# Patient Record
Sex: Female | Born: 1935
Health system: Southern US, Community
[De-identification: ages and names within clinical notes are randomized; demographics above are authoritative.]

## PROBLEM LIST (undated history)

## (undated) DIAGNOSIS — E7800 Pure hypercholesterolemia, unspecified: Secondary | ICD-10-CM

## (undated) DIAGNOSIS — D469 Myelodysplastic syndrome, unspecified: Secondary | ICD-10-CM

## (undated) DIAGNOSIS — I10 Essential (primary) hypertension: Secondary | ICD-10-CM

## (undated) DIAGNOSIS — E119 Type 2 diabetes mellitus without complications: Secondary | ICD-10-CM

## (undated) DIAGNOSIS — Z9109 Other allergy status, other than to drugs and biological substances: Secondary | ICD-10-CM

## (undated) DIAGNOSIS — D649 Anemia, unspecified: Secondary | ICD-10-CM

## (undated) HISTORY — DX: Myelodysplastic syndrome, unspecified: D46.9

## (undated) HISTORY — DX: Essential (primary) hypertension: I10

## (undated) HISTORY — PX: ABDOMINAL HYSTERECTOMY: SHX81

## (undated) HISTORY — DX: Type 2 diabetes mellitus without complications: E11.9

## (undated) HISTORY — PX: DILATION AND CURETTAGE OF UTERUS: SHX78

## (undated) HISTORY — DX: Anemia, unspecified: D64.9

## (undated) HISTORY — DX: Pure hypercholesterolemia, unspecified: E78.00

## (undated) HISTORY — DX: Other allergy status, other than to drugs and biological substances: Z91.09

---

## 1961-09-05 HISTORY — PX: TONSILECTOMY, ADENOIDECTOMY, BILATERAL MYRINGOTOMY AND TUBES: SHX2538

## 1983-09-06 HISTORY — PX: TOTAL ABDOMINAL HYSTERECTOMY W/ BILATERAL SALPINGOOPHORECTOMY: SHX83

## 2004-11-09 ENCOUNTER — Ambulatory Visit: Payer: Self-pay | Admitting: Internal Medicine

## 2004-11-11 ENCOUNTER — Ambulatory Visit: Payer: Self-pay | Admitting: Internal Medicine

## 2005-02-11 ENCOUNTER — Ambulatory Visit: Payer: Self-pay | Admitting: Internal Medicine

## 2005-08-16 ENCOUNTER — Ambulatory Visit: Payer: Self-pay | Admitting: Internal Medicine

## 2005-11-16 ENCOUNTER — Ambulatory Visit: Payer: Self-pay | Admitting: Internal Medicine

## 2006-06-19 ENCOUNTER — Ambulatory Visit: Payer: Self-pay | Admitting: Internal Medicine

## 2006-08-08 ENCOUNTER — Other Ambulatory Visit: Admission: RE | Admit: 2006-08-08 | Discharge: 2006-08-08 | Payer: Self-pay | Admitting: Surgery

## 2006-08-15 ENCOUNTER — Ambulatory Visit: Payer: Self-pay | Admitting: Unknown Physician Specialty

## 2006-11-27 ENCOUNTER — Ambulatory Visit: Payer: Self-pay | Admitting: Internal Medicine

## 2007-06-01 ENCOUNTER — Ambulatory Visit: Payer: Self-pay | Admitting: Family Medicine

## 2007-12-10 ENCOUNTER — Ambulatory Visit: Payer: Self-pay | Admitting: Internal Medicine

## 2008-12-25 ENCOUNTER — Ambulatory Visit: Payer: Self-pay | Admitting: Internal Medicine

## 2009-05-04 ENCOUNTER — Ambulatory Visit: Payer: Self-pay

## 2009-06-25 ENCOUNTER — Ambulatory Visit: Payer: Self-pay | Admitting: General Practice

## 2009-07-08 ENCOUNTER — Ambulatory Visit: Payer: Self-pay | Admitting: General Practice

## 2010-02-22 ENCOUNTER — Ambulatory Visit: Payer: Self-pay | Admitting: Internal Medicine

## 2011-02-02 ENCOUNTER — Ambulatory Visit: Payer: Self-pay | Admitting: Ophthalmology

## 2011-03-01 ENCOUNTER — Ambulatory Visit: Payer: Self-pay | Admitting: Internal Medicine

## 2012-02-01 LAB — LIPID PANEL
Cholesterol: 156 mg/dL (ref 0–200)
HDL: 3 mg/dL — AB (ref 35–70)
LDL Cholesterol: 54 mg/dL

## 2012-02-01 LAB — BASIC METABOLIC PANEL: Creatinine: 0.6 mg/dL (ref <=1.1)

## 2012-03-01 ENCOUNTER — Ambulatory Visit: Payer: Self-pay | Admitting: Internal Medicine

## 2012-03-01 LAB — HM MAMMOGRAPHY

## 2012-03-03 LAB — HEMOGLOBIN A1C: Hgb A1c MFr Bld: 6 % (ref 4.0–6.0)

## 2012-06-04 ENCOUNTER — Telehealth: Payer: Self-pay | Admitting: Internal Medicine

## 2012-06-04 ENCOUNTER — Encounter: Payer: Self-pay | Admitting: Internal Medicine

## 2012-06-04 ENCOUNTER — Ambulatory Visit (INDEPENDENT_AMBULATORY_CARE_PROVIDER_SITE_OTHER): Payer: Medicare Other | Admitting: Internal Medicine

## 2012-06-04 VITALS — BP 130/79 | HR 78 | Temp 98.1°F | Ht 61.75 in | Wt 186.5 lb

## 2012-06-04 DIAGNOSIS — I10 Essential (primary) hypertension: Secondary | ICD-10-CM | POA: Insufficient documentation

## 2012-06-04 DIAGNOSIS — M79609 Pain in unspecified limb: Secondary | ICD-10-CM

## 2012-06-04 DIAGNOSIS — J309 Allergic rhinitis, unspecified: Secondary | ICD-10-CM

## 2012-06-04 DIAGNOSIS — M79605 Pain in left leg: Secondary | ICD-10-CM

## 2012-06-04 DIAGNOSIS — Z9109 Other allergy status, other than to drugs and biological substances: Secondary | ICD-10-CM

## 2012-06-04 DIAGNOSIS — E119 Type 2 diabetes mellitus without complications: Secondary | ICD-10-CM

## 2012-06-04 DIAGNOSIS — D469 Myelodysplastic syndrome, unspecified: Secondary | ICD-10-CM

## 2012-06-04 DIAGNOSIS — Z23 Encounter for immunization: Secondary | ICD-10-CM

## 2012-06-04 MED ORDER — GLUCOSE BLOOD VI STRP: ORAL_STRIP | Status: DC

## 2012-06-04 NOTE — Telephone Encounter (Signed)
Spoke to pt.  She is coming in today at 1:30 for evaluation.

## 2012-06-04 NOTE — Telephone Encounter (Signed)
I put patient in for an appointment this afternoon at 1:30.

## 2012-06-04 NOTE — Assessment & Plan Note (Signed)
Controlled with Fluticasone.

## 2012-06-04 NOTE — Telephone Encounter (Signed)
Pt called insisting on getting an appointment with dr scott Pt stated she is taking revlimid 10mg   She has been taking this for 7 years Pt stated that her left leg has pain behind the left knee  Pain goes up and down leg Pt stated there is no discoloration or no swelling or heat I advise pt to call [facility] pt refused does not want to see anyone there Call a nurse cannot talk to pt because she has not been seen here Pt is going to call [facility] to see if you can be seen there

## 2012-06-04 NOTE — Progress Notes (Signed)
  Subjective:    Patient ID: Kara Griffin, female    DOB: 1936/01/22, 76 y.o.   MRN: [id number]  HPI  76 year old female with past history of diabetes, hypertension and myelodysplastic syndrome.  She comes in today as a workin with concerns regarding left leg pain that has been present for approximately six weeks.  Pain is in the left leg (behind the left knee).  Minimal swelling.  No redness.    Past Medical History  Diagnosis Date  . Hypertension   . Diabetes mellitus   . Hypercholesterolemia   . Environmental allergies   . Myelodysplastic syndrome     Sees Dr Miachel Roux     Review of Systems    Patient denies any headache, lightheadedness or dizziness.  No chest pain, tightness or palpitations. No increased shortness of breath, cough or congestion.  No nausea or vomiting.  No abdominal pain or cramping.  No bowel change, such as diarrhea, constipation, BRBPR or melana.  No urine change. She reports her left leg has been hurting for approximately six weeks.  Has been taking Tylenol ES (2/day) with some relief.  No known injury or trauma.  Pain increases with full extension of her leg.  No redness or increased warmth.  Some minimal soft tissue swelling - posterior knee.  Occasionally radiates up and down her leg.  She occasionally has some posterior buttock pain with pain extending down - posterior leg.  No new numbness or tingling.        Objective:   Physical Exam  76 year old female in no acute distress.  HEART:  Appears to be regular. LUNGS:  Without crackles or wheezing audible.  Respirations even and unlabored.    EXTREMITIES:  Minimal bilateral lower extremity edema.  Some increased soft tissue fullness left popliteal region.  Increased pain to palpation - left popliteal region.  No increased erythema or warmth.  Increased pain (in left popliteal region and extending - posterior thigh) with full extension of her left leg.  Some pain with weight bearing, but no pain with  abduction/adduction - left leg/hip.  DP pulses palpable and equal bilaterally (2 plus).                     Assessment & Plan:  1.  Left leg pain.  Given her history of Myelodysplastic Syndrome and current treatment with Revlimid (with the associated leg pain) - will obtain lower extremity venous ultrasound to rule out DVT.  Continue Tylenol ES - two tablets bid prn.  Will also confirm no evidence of a Bakers Cyst.  Further w/up and treatment pending results.

## 2012-06-04 NOTE — Assessment & Plan Note (Signed)
Blood pressure controlled on Losartan.

## 2012-06-04 NOTE — Assessment & Plan Note (Signed)
Continues on Revlimid.  Sees Dr Miachel Roux.  Per patient, counts have been stable.

## 2012-06-04 NOTE — Assessment & Plan Note (Addendum)
She started back on Amaryl (2mg ) 1/2 tab q day.  Sugars averaging 130-140 now.  Will plan for f/u met b and a1c. Check blood sugar bid since starting back on Amaryl.

## 2012-06-04 NOTE — Patient Instructions (Addendum)
It was good to see you again.  I'm sorry your leg has been bothering you.  I am going to order an ultrasound on your leg today - to confirm no clot.  Take your Tylenol ES as we discussed.  You will be notified of your ultrasound results - once they are available to [place].

## 2012-06-05 ENCOUNTER — Telehealth: Payer: Self-pay | Admitting: Internal Medicine

## 2012-06-05 ENCOUNTER — Encounter: Payer: Self-pay | Admitting: Internal Medicine

## 2012-06-05 DIAGNOSIS — M79606 Pain in leg, unspecified: Secondary | ICD-10-CM

## 2012-06-05 NOTE — Telephone Encounter (Signed)
Pt was notified of lower extremity ultrasound results -  No DVT, but did reveal a small popliteal cyst and a small amount of fluid behind the knee.  Given the persistant pain, ultrasound findings and inability to take antiinflammatories and other pain meds - needs referral to ortho.  Prefers to see Dr Ernest Pine or Lia Hopping (Dr Hooten's PA).  Thanks.

## 2012-06-05 NOTE — Telephone Encounter (Signed)
Pt was notified of lower extremity ultrasound results -   No DVT, but she did have a small cyst (popliteal) and fluid behind the knee.  Given her persistant leg pain, findings on ultrasound and inability to take antiinflammatories and increased pain meds - needs referral to ortho.  Wants to see Dr Ernest Pine or Lia Hopping (Dr Hooten's PA).  Can go anytime except next Tuesday (06/12/12).  thanks

## 2012-06-08 ENCOUNTER — Encounter: Payer: Self-pay | Admitting: Internal Medicine

## 2012-06-11 NOTE — Telephone Encounter (Signed)
Left message on machine at home advising patient that Erie Noe will be in contact with her to schedule appt.

## 2012-06-11 NOTE — Telephone Encounter (Signed)
Notify pt that Erie Noe has an order for the appt and should contact her with an appt.  Thanks.

## 2012-06-11 NOTE — Telephone Encounter (Signed)
Kara Griffin has the order to schedule the appt.  Please notify pt - Kara Griffin will call her with an appt.  Thanks.

## 2012-08-03 ENCOUNTER — Encounter: Payer: Self-pay | Admitting: *Deleted

## 2012-08-10 ENCOUNTER — Other Ambulatory Visit: Payer: Self-pay | Admitting: *Deleted

## 2012-08-10 ENCOUNTER — Encounter: Payer: Self-pay | Admitting: *Deleted

## 2012-08-13 ENCOUNTER — Encounter: Payer: Self-pay | Admitting: Internal Medicine

## 2012-08-13 ENCOUNTER — Ambulatory Visit (INDEPENDENT_AMBULATORY_CARE_PROVIDER_SITE_OTHER): Payer: Medicare Other | Admitting: Internal Medicine

## 2012-08-13 VITALS — BP 142/70 | HR 52 | Temp 97.8°F | Ht 61.75 in | Wt 185.0 lb

## 2012-08-13 DIAGNOSIS — I10 Essential (primary) hypertension: Secondary | ICD-10-CM

## 2012-08-13 DIAGNOSIS — D469 Myelodysplastic syndrome, unspecified: Secondary | ICD-10-CM

## 2012-08-13 DIAGNOSIS — M949 Disorder of cartilage, unspecified: Secondary | ICD-10-CM

## 2012-08-13 DIAGNOSIS — E7800 Pure hypercholesterolemia, unspecified: Secondary | ICD-10-CM

## 2012-08-13 DIAGNOSIS — M8580 Other specified disorders of bone density and structure, unspecified site: Secondary | ICD-10-CM | POA: Insufficient documentation

## 2012-08-13 DIAGNOSIS — Z9109 Other allergy status, other than to drugs and biological substances: Secondary | ICD-10-CM

## 2012-08-13 DIAGNOSIS — E041 Nontoxic single thyroid nodule: Secondary | ICD-10-CM | POA: Insufficient documentation

## 2012-08-13 DIAGNOSIS — M899 Disorder of bone, unspecified: Secondary | ICD-10-CM

## 2012-08-13 DIAGNOSIS — E119 Type 2 diabetes mellitus without complications: Secondary | ICD-10-CM

## 2012-08-13 MED ORDER — METFORMIN HCL 500 MG PO TABS: 500.0000 mg | ORAL_TABLET | Freq: Every day | ORAL | Status: DC

## 2012-08-13 NOTE — Progress Notes (Signed)
  Subjective:    Patient ID: Kara Griffin, female    DOB: 11/05/1935, 76 y.o.   MRN: [id number]  HPI 76 year old female with past history of myelodysplastic syndrome, anemia, diabetes and hypertension who comes in today for a scheduled follow up.  She states her sugars have been running high lately.  Is off Amaryl.  States her am sugars are averaging 160-170 and pm sugars 160-190.  Was having lows on the amaryl.  Trying to watch what she eats.  No nausea or vomiting.  Bowels stable.  Blood pressure averaging - 120s/70s.  Still seeing Dr Lowell Guitar.  Counts have been stable.   Past Medical History  Diagnosis Date  . Hypertension   . Diabetes mellitus   . Hypercholesterolemia   . Environmental allergies   . Myelodysplastic syndrome     Sees Dr Miachel Roux  . Anemia     Review of Systems Patient denies any headache, lightheadedness or dizziness.  No significant allergy or sinus symptoms.  No chest pain, tightness or palpitations.  No increased shortness of breath, cough or congestion.  No nausea or vomiting.  No abdominal pain or cramping.  No bowel change, such as diarrhea, constipation, BRBPR or melana.  No urine change.        Objective:   Physical Exam Filed Vitals:   08/13/12 1404  BP: 142/70  Pulse: 52  Temp: 97.8 F (69.28 C)   76 year old female in no acute distress.   HEENT:  Nares - clear.  OP- without lesions or erythema.  NECK:  Supple, nontender.  No audible bruit.   HEART:  Appears to be regular. LUNGS:  Without crackles or wheezing audible.  Respirations even and unlabored.   RADIAL PULSE:  Equal bilaterally.  ABDOMEN:  Soft, nontender.  No audible abdominal bruit.   EXTREMITIES:  No increased edema to be present.                     Assessment & Plan:  GI.  Colonoscopy 08/05/06 - internal nonbleeding hemorrhoids.  Bowels stable.   HEALTH MAINTENANCE.  Physical 02/08/12.  Colonoscopy as outlined.  Pap 01/11/10 negative.  Mammogram 03/01/11 - BiRADS II.  Need results of  2013 mammogram.

## 2012-08-13 NOTE — Patient Instructions (Addendum)
It was nice seeing you today.  I am going to start metformin 500mg  one per day.  Check your blood sugar twice a day.  Let me know if any low sugars.

## 2012-08-17 ENCOUNTER — Other Ambulatory Visit (INDEPENDENT_AMBULATORY_CARE_PROVIDER_SITE_OTHER): Payer: Medicare Other

## 2012-08-17 DIAGNOSIS — E041 Nontoxic single thyroid nodule: Secondary | ICD-10-CM

## 2012-08-17 DIAGNOSIS — E7800 Pure hypercholesterolemia, unspecified: Secondary | ICD-10-CM

## 2012-08-17 DIAGNOSIS — E119 Type 2 diabetes mellitus without complications: Secondary | ICD-10-CM

## 2012-08-17 LAB — HEMOGLOBIN A1C: Hgb A1c MFr Bld: 6.2 % (ref 4.6–6.5)

## 2012-08-17 LAB — BASIC METABOLIC PANEL
BUN: 16 mg/dL (ref 6–23)
CO2: 28 mEq/L (ref 19–32)
Calcium: 9.3 mg/dL (ref 8.4–10.5)
Chloride: 102 mEq/L (ref 96–112)
Creatinine, Ser: 0.7 mg/dL (ref 0.4–1.2)
GFR: 85.32 mL/min (ref >60.00)
Glucose, Bld: 168 mg/dL — ABNORMAL HIGH (ref 70–99)
Potassium: 4.1 mEq/L (ref 3.5–5.1)
Sodium: 137 mEq/L (ref 135–145)

## 2012-08-17 LAB — LIPID PANEL
Cholesterol: 151 mg/dL (ref 0–200)
HDL: 52.8 mg/dL (ref >39.00)
LDL Cholesterol: 71 mg/dL (ref 0–99)
Total CHOL/HDL Ratio: 3
Triglycerides: 134 mg/dL (ref 0.0–149.0)
VLDL: 26.8 mg/dL (ref 0.0–40.0)

## 2012-08-17 LAB — TSH: TSH: 1.36 u[IU]/mL (ref 0.35–5.50)

## 2012-08-18 ENCOUNTER — Telehealth: Payer: Self-pay | Admitting: Internal Medicine

## 2012-08-18 ENCOUNTER — Encounter: Payer: Self-pay | Admitting: Internal Medicine

## 2012-08-18 NOTE — Assessment & Plan Note (Signed)
Sugars as outlined.  Continue diabetic diet.  Will start metformin 500mg  q day.  Follow sugars and record.  Get her back in soon to reassess.

## 2012-08-18 NOTE — Telephone Encounter (Signed)
Pt notified of labs via my chart messaging.  

## 2012-08-18 NOTE — Assessment & Plan Note (Signed)
Bone density revealed osteopenia.  Continue calcium with vitamin D.  Follow.

## 2012-08-18 NOTE — Assessment & Plan Note (Signed)
Continues to follow up with Dr Lowell Guitar.  Counts have been stable.

## 2012-08-18 NOTE — Assessment & Plan Note (Signed)
Worked up by Dr Gerkin.  Has been released.  Follow TSH.    

## 2012-08-18 NOTE — Assessment & Plan Note (Signed)
Blood pressure under good control.  Same meds.  Check metabolic panel.  

## 2012-08-18 NOTE — Assessment & Plan Note (Signed)
Low cholesterol diet and exercise.  Check lipid profile.   

## 2012-08-18 NOTE — Assessment & Plan Note (Signed)
Controlled.  

## 2012-08-21 ENCOUNTER — Telehealth: Payer: Self-pay | Admitting: Internal Medicine

## 2012-08-21 NOTE — Telephone Encounter (Signed)
She can increase metformin to bid.  Take one before breakfast and one before supper.  Continue to record sugars.  Let me know if problems.

## 2012-08-21 NOTE — Telephone Encounter (Signed)
Called patient on cell line was busy. Could not get through on phone line. Will call back later.

## 2012-08-21 NOTE — Telephone Encounter (Signed)
Actually have her stay on the metformin q day for now.  Continue to record sugars.  Will see if need to increase in the future.  Let me know if dizziness does not resolve.

## 2012-08-21 NOTE — Telephone Encounter (Signed)
Pt is calling about Metformin and she is feeling better in some ways and greatly improved her energy but her sugar is still high and it is not lowering her numbers.  Its was 169  [phone] 188 [phone] 182   She is having slight dizziness as a side affect but it doesn't last long and it seems to be decreasing.

## 2012-08-23 NOTE — Telephone Encounter (Signed)
Called patients son and left message on machine per patients request

## 2012-08-23 NOTE — Telephone Encounter (Signed)
Patient called confused about message left on machine. I gave message to her.

## 2012-08-27 ENCOUNTER — Telehealth: Payer: Self-pay | Admitting: Internal Medicine

## 2012-08-27 NOTE — Telephone Encounter (Signed)
Patient doesn't need refill per [facility]. She needs form documented for medicare. They will be faxing over form

## 2012-08-27 NOTE — Telephone Encounter (Signed)
Contour test strips 50's  Use twice daily  #50

## 2012-08-28 ENCOUNTER — Other Ambulatory Visit: Payer: Self-pay | Admitting: Internal Medicine

## 2012-08-28 MED ORDER — LOSARTAN POTASSIUM 50 MG PO TABS: 50.0000 mg | ORAL_TABLET | Freq: Every day | ORAL | Status: DC

## 2012-08-28 NOTE — Telephone Encounter (Signed)
Prescription called in to pharmacy and patient notified.

## 2012-08-28 NOTE — Telephone Encounter (Signed)
Refill Losartan 50 mg  [facility] [place]

## 2012-09-04 ENCOUNTER — Telehealth: Payer: Self-pay | Admitting: *Deleted

## 2012-09-04 NOTE — Telephone Encounter (Signed)
Patient notified

## 2012-09-04 NOTE — Telephone Encounter (Signed)
Call concerning sugar readings patient dropped off for Dr. Lorin Picket. Called patient to tell her to take metformin bid (per scott). One with breakfast and one with supper. Left message for patient to return call. Patient is to continue to record sugars.

## 2012-09-20 ENCOUNTER — Encounter: Payer: Self-pay | Admitting: Internal Medicine

## 2012-09-20 ENCOUNTER — Ambulatory Visit (INDEPENDENT_AMBULATORY_CARE_PROVIDER_SITE_OTHER): Payer: 59 | Admitting: Internal Medicine

## 2012-09-20 VITALS — BP 120/70 | HR 85 | Temp 97.8°F | Ht 61.75 in | Wt 187.5 lb

## 2012-09-20 DIAGNOSIS — Z9109 Other allergy status, other than to drugs and biological substances: Secondary | ICD-10-CM

## 2012-09-20 DIAGNOSIS — H539 Unspecified visual disturbance: Secondary | ICD-10-CM

## 2012-09-20 DIAGNOSIS — I10 Essential (primary) hypertension: Secondary | ICD-10-CM

## 2012-09-20 DIAGNOSIS — E119 Type 2 diabetes mellitus without complications: Secondary | ICD-10-CM

## 2012-09-20 DIAGNOSIS — D469 Myelodysplastic syndrome, unspecified: Secondary | ICD-10-CM

## 2012-09-20 DIAGNOSIS — E7800 Pure hypercholesterolemia, unspecified: Secondary | ICD-10-CM

## 2012-09-20 MED ORDER — FLUTICASONE PROPIONATE 50 MCG/ACT NA SUSP: 2.0000 | Freq: Every day | NASAL | Status: DC

## 2012-09-20 MED ORDER — METFORMIN HCL 500 MG PO TABS: 500.0000 mg | ORAL_TABLET | Freq: Two times a day (BID) | ORAL | Status: DC

## 2012-09-23 ENCOUNTER — Encounter: Payer: Self-pay | Admitting: Internal Medicine

## 2012-09-23 NOTE — Assessment & Plan Note (Signed)
Blood pressure doing well.  Same medication regimen.  Follow metabolic panel.   

## 2012-09-23 NOTE — Assessment & Plan Note (Signed)
Low cholesterol diet and exercise.  Follow.   

## 2012-09-23 NOTE — Assessment & Plan Note (Signed)
Sugars trending down on metformin bid.  Follow.

## 2012-09-23 NOTE — Assessment & Plan Note (Signed)
Controlled.  

## 2012-09-23 NOTE — Assessment & Plan Note (Signed)
Followed by Dr Powell.  Counts have been stable.  Follow.  

## 2012-09-23 NOTE — Progress Notes (Signed)
Subjective:    Patient ID: Kara Griffin, female    DOB: 09-Apr-1936, 77 y.o.   MRN: [id number]  HPI 77 year old female with past history of myelodysplastic syndrome, anemia, diabetes and hypertension who comes in today for a scheduled follow up.  Sugars were running high.  She was started on metformin.  Continued to remain elevated and a couple of weeks ago, metformin was increased to bid.   Her am sugars are averaging 148-170 and pm sugars 160-190.  Was having lows on the amaryl.  Trying to watch what she eats.  No nausea or vomiting.  Bowels doing better since starting the metformin.   Blood pressure averaging - 120s/70s.  Still seeing Dr Lowell Guitar.  Counts have been stable.  She does report that today approximately 12:00 she noticed a red light - left eye.  Lasted a few seconds.  Was a little dizzy.  Eye hurts some now.  Vision back to normal.  No longer seeing red.  Eye feels irritated.    Past Medical History  Diagnosis Date  . Hypertension   . Diabetes mellitus   . Hypercholesterolemia   . Environmental allergies   . Myelodysplastic syndrome     Sees Dr Miachel Roux  . Anemia     Current Outpatient Prescriptions on File Prior to Visit  Medication Sig Dispense Refill  . Calcium Carbonate-Vitamin D (CALCIUM 600+D) 600-400 MG-UNIT per tablet Take 1 tablet by mouth daily.       . fish oil-omega-3 fatty acids 1000 MG capsule Take 1 g by mouth daily.       . fluticasone (FLONASE) 50 MCG/ACT nasal spray Place 2 sprays into the nose daily.  16 g  3  . glucose blood (BAYER CONTOUR NEXT TEST) test strip Use as instructed  60 each  12  . lenalidomide (REVLIMID) 10 MG capsule Take 10 mg by mouth daily.      Marland Kitchen losartan (COZAAR) 50 MG tablet Take 1 tablet (50 mg total) by mouth daily.  30 tablet  2  . metFORMIN (GLUCOPHAGE) 500 MG tablet Take 1 tablet (500 mg total) by mouth 2 (two) times daily.  60 tablet  5  . aspirin 81 MG tablet Take 81 mg by mouth daily.      . Cholecalciferol (VITAMIN D-3)  1000 UNITS CAPS Take by mouth daily.      Marland Kitchen estradiol (ESTRACE) 1 MG tablet Take 1 mg by mouth daily.        Review of Systems Patient denies any headache.  Had a little dizziness with the vision change, but states the dizziness that she was experiencing previously has resolved.   No significant allergy or sinus symptoms.  No chest pain, tightness or palpitations.  No increased shortness of breath, cough or congestion.  No nausea or vomiting.  No abdominal pain or cramping.  No BRBPR or melana.  Bowels doing better. No urine change.        Objective:   Physical Exam  Filed Vitals:   09/20/12 1355  BP: 120/70  Pulse: 85  Temp: 97.8 F (36.6 C)   Blood pressure recheck:  68/24  77 year old female in no acute distress.   HEENT:  Nares - clear.  OP- without lesions or erythema.  Left eye - minimal erythemal  NECK:  Supple, nontender.  No audible bruit.   HEART:  Appears to be regular. LUNGS:  Without crackles or wheezing audible.  Respirations even and unlabored.   RADIAL PULSE:  Equal bilaterally.  ABDOMEN:  Soft, nontender.  No audible abdominal bruit.   EXTREMITIES:  No increased edema to be present.                     Assessment & Plan:  OPTHALMOLOGY.  Vision change as outlined.  Eye still feeling a little irritated.  Called opthalmology.  They are going to see her now.    GI.  Colonoscopy 08/05/06 - internal nonbleeding hemorrhoids.  Bowels doing better on metformin.  Follow.    HEALTH MAINTENANCE.  Physical 02/08/12.  Colonoscopy as outlined.  Pap 01/11/10 negative.  Mammogram 03/01/12 - BiRADS II.

## 2012-10-31 ENCOUNTER — Telehealth: Payer: Self-pay | Admitting: *Deleted

## 2012-10-31 MED ORDER — METFORMIN HCL 500 MG PO TABS: ORAL_TABLET | ORAL | Status: DC

## 2012-10-31 NOTE — Telephone Encounter (Signed)
Called patient about sugars readings. Sent new prescription in to [facility].

## 2012-11-21 ENCOUNTER — Other Ambulatory Visit: Payer: Self-pay | Admitting: *Deleted

## 2012-11-21 MED ORDER — LOSARTAN POTASSIUM 50 MG PO TABS: 50.0000 mg | ORAL_TABLET | Freq: Every day | ORAL | Status: DC

## 2012-11-21 NOTE — Telephone Encounter (Signed)
Sent in to pharmacy.  

## 2012-11-29 ENCOUNTER — Ambulatory Visit (INDEPENDENT_AMBULATORY_CARE_PROVIDER_SITE_OTHER): Payer: 59 | Admitting: Internal Medicine

## 2012-11-29 ENCOUNTER — Encounter: Payer: Self-pay | Admitting: Internal Medicine

## 2012-11-29 VITALS — BP 134/70 | HR 80 | Temp 98.2°F | Ht 61.75 in | Wt 188.5 lb

## 2012-11-29 DIAGNOSIS — M949 Disorder of cartilage, unspecified: Secondary | ICD-10-CM

## 2012-11-29 DIAGNOSIS — Z9109 Other allergy status, other than to drugs and biological substances: Secondary | ICD-10-CM

## 2012-11-29 DIAGNOSIS — I10 Essential (primary) hypertension: Secondary | ICD-10-CM

## 2012-11-29 DIAGNOSIS — E119 Type 2 diabetes mellitus without complications: Secondary | ICD-10-CM

## 2012-11-29 DIAGNOSIS — M8580 Other specified disorders of bone density and structure, unspecified site: Secondary | ICD-10-CM

## 2012-11-29 DIAGNOSIS — D469 Myelodysplastic syndrome, unspecified: Secondary | ICD-10-CM

## 2012-11-29 DIAGNOSIS — E041 Nontoxic single thyroid nodule: Secondary | ICD-10-CM

## 2012-11-29 DIAGNOSIS — E7800 Pure hypercholesterolemia, unspecified: Secondary | ICD-10-CM

## 2012-11-29 MED ORDER — METFORMIN HCL 1000 MG PO TABS: 1000.0000 mg | ORAL_TABLET | Freq: Two times a day (BID) | ORAL | Status: DC

## 2012-12-02 ENCOUNTER — Encounter: Payer: Self-pay | Admitting: Internal Medicine

## 2012-12-02 NOTE — Assessment & Plan Note (Signed)
Controlled.  

## 2012-12-02 NOTE — Assessment & Plan Note (Signed)
Worked up by Dr Gerkin.  Has been released.  Follow TSH.    

## 2012-12-02 NOTE — Progress Notes (Signed)
  Subjective:    Patient ID: Kara Griffin, female    DOB: 12-08-1935, 77 y.o.   MRN: [id number]  HPI 77 year old female with past history of myelodysplastic syndrome, anemia, diabetes and hypertension who comes in today for a scheduled follow up.  Sugars were running high.  She was started on metformin.  Her am sugars are averaging 160-170 and pm sugars 140-170s.  Was having lows on amaryl.  Trying to watch what she eats.  No nausea or vomiting.  Bowels doing better since starting the metformin.   Blood pressure averaging - 120s/70s.  Still seeing Dr Lowell Guitar.  Counts have been stable.  Overall she feels sh e is doing well.     Past Medical History  Diagnosis Date  . Hypertension   . Diabetes mellitus   . Hypercholesterolemia   . Environmental allergies   . Myelodysplastic syndrome     Sees Dr Miachel Roux  . Anemia     Current Outpatient Prescriptions on File Prior to Visit  Medication Sig Dispense Refill  . aspirin 81 MG tablet Take 81 mg by mouth daily.      . Calcium Carbonate-Vitamin D (CALCIUM 600+D) 600-400 MG-UNIT per tablet Take 1 tablet by mouth daily.       . fish oil-omega-3 fatty acids 1000 MG capsule Take 1 g by mouth daily.       . fluticasone (FLONASE) 50 MCG/ACT nasal spray Place 2 sprays into the nose daily.  16 g  3  . glucose blood (BAYER CONTOUR NEXT TEST) test strip Use as instructed  60 each  12  . lenalidomide (REVLIMID) 10 MG capsule Take 10 mg by mouth daily.      Marland Kitchen losartan (COZAAR) 50 MG tablet Take 1 tablet (50 mg total) by mouth daily.  30 tablet  5   No current facility-administered medications on file prior to visit.    Review of Systems Patient denies any headache.   No significant allergy or sinus symptoms.  No chest pain, tightness or palpitations.  No increased shortness of breath, cough or congestion.  No nausea or vomiting.  No abdominal pain or cramping.  No BRBPR or melana.  Bowels doing better. No urine change.        Objective:   Physical  Exam  Filed Vitals:   11/29/12 1410  BP: 134/70  Pulse: 80  Temp: 98.2 F (36.8 C)   Blood pressure recheck:  134/68, pulse 67  76 year old female in no acute distress.   HEENT:  Nares - clear.  OP- without lesions or erythema.  Left eye - minimal erythemal  NECK:  Supple, nontender.  No audible bruit.   HEART:  Appears to be regular. LUNGS:  Without crackles or wheezing audible.  Respirations even and unlabored.   RADIAL PULSE:  Equal bilaterally.  ABDOMEN:  Soft, nontender.  No audible abdominal bruit.   EXTREMITIES:  No increased edema to be present.                     Assessment & Plan:  GI.  Colonoscopy 08/05/06 - internal nonbleeding hemorrhoids.  Bowels doing better on metformin.  Follow.    HEALTH MAINTENANCE.  Physical 02/08/12.  Colonoscopy as outlined.  Pap 01/11/10 negative.  Mammogram 03/01/12 - BiRADS II.

## 2012-12-02 NOTE — Assessment & Plan Note (Signed)
Blood pressure doing well.  Same medication regimen.  Follow metabolic panel.   

## 2012-12-02 NOTE — Assessment & Plan Note (Signed)
Low cholesterol diet and exercise.  Follow.   

## 2012-12-02 NOTE — Assessment & Plan Note (Signed)
Bone density revealed osteopenia.  Continue calcium with vitamin D.  Follow.

## 2012-12-02 NOTE — Assessment & Plan Note (Signed)
Followed by Dr Powell.  Counts have been stable.  Follow.  

## 2012-12-02 NOTE — Assessment & Plan Note (Signed)
Sugars trending down on metformin.  Will increase metformin to 1000 bid.  Follow sugars.

## 2013-02-06 ENCOUNTER — Other Ambulatory Visit: Payer: Self-pay | Admitting: Internal Medicine

## 2013-03-11 ENCOUNTER — Encounter: Payer: Self-pay | Admitting: *Deleted

## 2013-03-25 ENCOUNTER — Other Ambulatory Visit (INDEPENDENT_AMBULATORY_CARE_PROVIDER_SITE_OTHER): Payer: 59

## 2013-03-25 DIAGNOSIS — I10 Essential (primary) hypertension: Secondary | ICD-10-CM

## 2013-03-25 DIAGNOSIS — E7800 Pure hypercholesterolemia, unspecified: Secondary | ICD-10-CM

## 2013-03-25 DIAGNOSIS — E119 Type 2 diabetes mellitus without complications: Secondary | ICD-10-CM

## 2013-03-25 LAB — LIPID PANEL
Cholesterol: 144 mg/dL (ref 0–200)
HDL: 41.8 mg/dL (ref >39.00)
Triglycerides: 244 mg/dL — ABNORMAL HIGH (ref 0.0–149.0)

## 2013-03-25 LAB — BASIC METABOLIC PANEL
CO2: 27 mEq/L (ref 19–32)
GFR: 91.08 mL/min (ref >60.00)
Glucose, Bld: 158 mg/dL — ABNORMAL HIGH (ref 70–99)
Potassium: 4.1 mEq/L (ref 3.5–5.1)
Sodium: 138 mEq/L (ref 135–145)

## 2013-03-26 ENCOUNTER — Telehealth: Payer: Self-pay | Admitting: *Deleted

## 2013-03-26 LAB — LDL CHOLESTEROL, DIRECT: Direct LDL: 73.3 mg/dL

## 2013-03-26 NOTE — Telephone Encounter (Signed)
Received a call for the [facility] they would like a re-draw for the A1C

## 2013-03-27 ENCOUNTER — Telehealth: Payer: Self-pay | Admitting: Internal Medicine

## 2013-03-27 NOTE — Telephone Encounter (Signed)
Notified pt blood clotted.

## 2013-04-01 ENCOUNTER — Ambulatory Visit (INDEPENDENT_AMBULATORY_CARE_PROVIDER_SITE_OTHER): Payer: 59 | Admitting: Internal Medicine

## 2013-04-01 ENCOUNTER — Encounter: Payer: Self-pay | Admitting: Internal Medicine

## 2013-04-01 VITALS — BP 124/62 | HR 75 | Temp 98.2°F | Ht 61.75 in | Wt 187.5 lb

## 2013-04-01 DIAGNOSIS — E7800 Pure hypercholesterolemia, unspecified: Secondary | ICD-10-CM

## 2013-04-01 DIAGNOSIS — I10 Essential (primary) hypertension: Secondary | ICD-10-CM

## 2013-04-01 DIAGNOSIS — M8580 Other specified disorders of bone density and structure, unspecified site: Secondary | ICD-10-CM

## 2013-04-01 DIAGNOSIS — D469 Myelodysplastic syndrome, unspecified: Secondary | ICD-10-CM

## 2013-04-01 DIAGNOSIS — M899 Disorder of bone, unspecified: Secondary | ICD-10-CM

## 2013-04-01 DIAGNOSIS — E119 Type 2 diabetes mellitus without complications: Secondary | ICD-10-CM

## 2013-04-01 DIAGNOSIS — M949 Disorder of cartilage, unspecified: Secondary | ICD-10-CM

## 2013-04-01 DIAGNOSIS — E041 Nontoxic single thyroid nodule: Secondary | ICD-10-CM

## 2013-04-01 DIAGNOSIS — Z9109 Other allergy status, other than to drugs and biological substances: Secondary | ICD-10-CM

## 2013-04-01 NOTE — Progress Notes (Signed)
Subjective:    Patient ID: Kara Griffin, female    DOB: 1936/01/17, 77 y.o.   MRN: [id number]  HPI 77 year old female with past history of myelodysplastic syndrome, anemia, diabetes and hypertension who comes in today for a scheduled follow up.   Her am sugars are averaging 160-170 and pm sugars 160-190s.  Trying to watch what she eats.  No nausea or vomiting.  Bowels doing better since starting the metformin.   Blood pressure doing well.  Still seeing Dr Lowell Guitar.  Counts have been stable.  Overall she feels she is doing well.  Occasional mild light headedness.  Notices when standing.  No chest pain or tightness.  Breathing stable.  No headache.   She cut out/down wheat in her diet.  Arthritis better.  Energy better.     Past Medical History  Diagnosis Date  . Hypertension   . Diabetes mellitus   . Hypercholesterolemia   . Environmental allergies   . Myelodysplastic syndrome     Sees Dr Miachel Roux  . Anemia     Current Outpatient Prescriptions on File Prior to Visit  Medication Sig Dispense Refill  . aspirin 81 MG tablet Take 81 mg by mouth daily.      . Calcium Carbonate-Vitamin D (CALCIUM 600+D) 600-400 MG-UNIT per tablet Take 1 tablet by mouth daily.       . fish oil-omega-3 fatty acids 1000 MG capsule Take 1 g by mouth daily.       . fluticasone (FLONASE) 50 MCG/ACT nasal spray USE 2 SPRAYS IN EACH NOSTRIL DAILY  16 g  5  . glucose blood (BAYER CONTOUR NEXT TEST) test strip Use as instructed  60 each  12  . lenalidomide (REVLIMID) 10 MG capsule Take 10 mg by mouth daily.      Marland Kitchen losartan (COZAAR) 50 MG tablet Take 1 tablet (50 mg total) by mouth daily.  30 tablet  5  . metFORMIN (GLUCOPHAGE) 1000 MG tablet Take 1 tablet (1,000 mg total) by mouth 2 (two) times daily with a meal.  60 tablet  3   No current facility-administered medications on file prior to visit.    Review of Systems Patient denies any headache.   No significant allergy or sinus symptoms.  No chest pain,  tightness or palpitations.  No increased shortness of breath, cough or congestion.  No nausea or vomiting.  No abdominal pain or cramping.  No BRBPR or melana.  Bowels doing better. No urine change.        Objective:   Physical Exam  Filed Vitals:   04/01/13 1432  BP: 124/62  Pulse: 75  Temp: 98.2 F (36.8 C)   Pulse 110  77 year old female in no acute distress.   HEENT:  Nares - clear.  OP- without lesions or erythema.  Left eye - minimal erythemal  NECK:  Supple, nontender.  No audible bruit.   HEART:  Appears to be regular. LUNGS:  Without crackles or wheezing audible.  Respirations even and unlabored.   RADIAL PULSE:  Equal bilaterally.  ABDOMEN:  Soft, nontender.  No audible abdominal bruit.   EXTREMITIES:  No increased edema to be present.                     Assessment & Plan:  GI.  Colonoscopy 08/05/06 - internal nonbleeding hemorrhoids.  Bowels doing better on metformin.  Follow.    LIGHT HEADEDNESS.  Minimal.  Not orthostatic on exam.  Stay  hydrated.  Follow.  She desires no further w/up at this point.    HEALTH MAINTENANCE.  Physical 02/08/12.  Colonoscopy as outlined.  Pap 01/11/10 negative.  Mammogram 03/01/12 - BiRADS II.  Schedule a physical for next visit.

## 2013-04-02 ENCOUNTER — Encounter: Payer: Self-pay | Admitting: Internal Medicine

## 2013-04-02 NOTE — Assessment & Plan Note (Signed)
Blood pressure doing well.  Same medication regimen.  Follow metabolic panel.   

## 2013-04-02 NOTE — Assessment & Plan Note (Signed)
Bone density revealed osteopenia.  Continue calcium with vitamin D.  Follow.

## 2013-04-02 NOTE — Assessment & Plan Note (Signed)
Followed by Dr Powell.  Counts have been stable.  Follow.  

## 2013-04-02 NOTE — Assessment & Plan Note (Signed)
On metformin1000 bid.  Continue diet adjustment and exercise.  Follow.  Check a1c today.  Hold on additional medication.

## 2013-04-02 NOTE — Assessment & Plan Note (Signed)
Low cholesterol diet and exercise.  Follow.  Most recent fasting lipid profile revealed total cholesterol 144, triglycerides 244, HDL 42 and LDL 73.

## 2013-04-02 NOTE — Assessment & Plan Note (Signed)
Controlled.  

## 2013-04-02 NOTE — Assessment & Plan Note (Signed)
Worked up by Dr Gerkin.  Has been released.  Follow TSH.    

## 2013-04-03 ENCOUNTER — Encounter: Payer: Self-pay | Admitting: Internal Medicine

## 2013-04-09 ENCOUNTER — Other Ambulatory Visit: Payer: Self-pay | Admitting: Internal Medicine

## 2013-04-10 ENCOUNTER — Other Ambulatory Visit: Payer: Self-pay

## 2013-04-10 ENCOUNTER — Encounter: Payer: Self-pay | Admitting: *Deleted

## 2013-05-19 ENCOUNTER — Other Ambulatory Visit: Payer: Self-pay | Admitting: Internal Medicine

## 2013-07-11 ENCOUNTER — Other Ambulatory Visit: Payer: Self-pay

## 2013-08-12 ENCOUNTER — Other Ambulatory Visit: Payer: Self-pay | Admitting: Internal Medicine

## 2013-08-12 ENCOUNTER — Ambulatory Visit (INDEPENDENT_AMBULATORY_CARE_PROVIDER_SITE_OTHER): Payer: 59 | Admitting: Internal Medicine

## 2013-08-12 ENCOUNTER — Encounter: Payer: Self-pay | Admitting: Internal Medicine

## 2013-08-12 VITALS — BP 130/78 | HR 78 | Temp 97.8°F | Ht 61.5 in | Wt 180.0 lb

## 2013-08-12 DIAGNOSIS — M8580 Other specified disorders of bone density and structure, unspecified site: Secondary | ICD-10-CM

## 2013-08-12 DIAGNOSIS — I10 Essential (primary) hypertension: Secondary | ICD-10-CM

## 2013-08-12 DIAGNOSIS — M899 Disorder of bone, unspecified: Secondary | ICD-10-CM

## 2013-08-12 DIAGNOSIS — E041 Nontoxic single thyroid nodule: Secondary | ICD-10-CM

## 2013-08-12 DIAGNOSIS — E7800 Pure hypercholesterolemia, unspecified: Secondary | ICD-10-CM

## 2013-08-12 DIAGNOSIS — E119 Type 2 diabetes mellitus without complications: Secondary | ICD-10-CM

## 2013-08-12 DIAGNOSIS — D469 Myelodysplastic syndrome, unspecified: Secondary | ICD-10-CM

## 2013-08-12 DIAGNOSIS — Z9109 Other allergy status, other than to drugs and biological substances: Secondary | ICD-10-CM

## 2013-08-12 DIAGNOSIS — Z1239 Encounter for other screening for malignant neoplasm of breast: Secondary | ICD-10-CM

## 2013-08-12 DIAGNOSIS — Z1211 Encounter for screening for malignant neoplasm of colon: Secondary | ICD-10-CM

## 2013-08-12 NOTE — Progress Notes (Signed)
Orders placed for labs

## 2013-08-12 NOTE — Progress Notes (Signed)
Pre-visit discussion using our clinic review tool. No additional management support is needed unless otherwise documented below in the visit note.  

## 2013-08-12 NOTE — Progress Notes (Signed)
Subjective:    Patient ID: Kara Griffin, female    DOB: [birth year], 77 y.o.   MRN: [id number]  HPI 77 year old female with past history of myelodysplastic syndrome, anemia, diabetes and hypertension who comes in today to follow up on these issues as well as for a complete physical exam.  Her am sugars are averaging 140-160s and pm sugars 150-170s.  Trying to watch what she eats.  No nausea or vomiting.   Blood pressure doing well.  Still seeing Dr Lowell Guitar.  Counts have been stable.  Overall she feels she is doing well.   No chest pain or tightness.  Breathing stable.  No headache.   She cut out/down wheat in her diet.  Arthritis better.  Energy better.  Bowels better.    Past Medical History  Diagnosis Date  . Hypertension   . Diabetes mellitus   . Hypercholesterolemia   . Environmental allergies   . Myelodysplastic syndrome     Sees Dr Miachel Roux  . Anemia     Current Outpatient Prescriptions on File Prior to Visit  Medication Sig Dispense Refill  . aspirin 81 MG tablet Take 81 mg by mouth daily.      Marland Kitchen BAYER CONTOUR TEST test strip USE TWICE DAILY  50 each  11  . Calcium Carbonate-Vitamin D (CALCIUM 600+D) 600-400 MG-UNIT per tablet Take 1 tablet by mouth daily.       . fish oil-omega-3 fatty acids 1000 MG capsule Take 1 g by mouth daily.       Marland Kitchen lenalidomide (REVLIMID) 10 MG capsule Take 10 mg by mouth daily.      Marland Kitchen losartan (COZAAR) 50 MG tablet TAKE 1 TABLET BY MOUTH EVERY DAY  30 tablet  5  . metFORMIN (GLUCOPHAGE) 1000 MG tablet TAKE 1 TABLET BY MOUTH TWICE DAILY WITH MEALS  60 tablet  5   No current facility-administered medications on file prior to visit.    Review of Systems Patient denies any headache.   No significant allergy or sinus symptoms.  No chest pain, tightness or palpitations.  No increased shortness of breath, cough or congestion.  No nausea or vomiting.  No abdominal pain or cramping.  No BRBPR or melana.  Bowels doing better.  No urine change.  Joints  better.   Sugars as outlined.       Objective:   Physical Exam  Filed Vitals:   08/12/13 1333  BP: 130/78  Pulse: 78  Temp: 97.8 F (36.6 C)   Pulse 84, blood pressure recheck:  61/40  77 year old female in no acute distress.   HEENT:  Nares- clear.  Oropharynx - without lesions. NECK:  Supple.  Nontender.  No audible bruit.  HEART:  Appears to be regular. LUNGS:  No crackles or wheezing audible.  Respirations even and unlabored.  RADIAL PULSE:  Equal bilaterally.    BREASTS:  No nipple discharge or nipple retraction present.  Could not appreciate any distinct nodules or axillary adenopathy.  ABDOMEN:  Soft, nontender.  Bowel sounds present and normal.  No audible abdominal bruit.  GU:  Not performed.    EXTREMITIES:  No increased edema present.  DP pulses palpable and equal bilaterally.  FEET:  No lesions.          Assessment & Plan:  GI.  Colonoscopy 08/05/06 - internal nonbleeding hemorrhoids.  Bowels doing better on metformin and since she adjusted her diet.   Follow.  IFOB given.   HEALTH MAINTENANCE.  Physical today.  Colonoscopy as outlined.  Pap 01/11/10 negative.  Mammogram 03/01/12 - BiRADS II.  Schedule mammogram.  IFOB given.

## 2013-08-14 ENCOUNTER — Encounter: Payer: Self-pay | Admitting: Internal Medicine

## 2013-08-14 LAB — HM DIABETES FOOT EXAM

## 2013-08-14 NOTE — Assessment & Plan Note (Signed)
Followed by Dr Powell.  Counts have been stable.  Follow.  

## 2013-08-14 NOTE — Assessment & Plan Note (Signed)
On metformin1000 bid.  Continue diet adjustment and exercise.  Follow.  Check a1c with next labs.  Sugars as outlined.  Hold on additional medication.   

## 2013-08-14 NOTE — Assessment & Plan Note (Signed)
Bone density revealed osteopenia.  Continue vitamin D.  Follow.   

## 2013-08-14 NOTE — Assessment & Plan Note (Signed)
Controlled.  

## 2013-08-14 NOTE — Assessment & Plan Note (Signed)
Low cholesterol diet and exercise.  Follow.  Last fasting lipid profile revealed total cholesterol 144, triglycerides 244, HDL 42 and LDL 73.

## 2013-08-14 NOTE — Assessment & Plan Note (Signed)
Blood pressure doing well.  Same medication regimen.  Follow metabolic panel.   

## 2013-08-14 NOTE — Assessment & Plan Note (Signed)
Worked up by Dr Gerkin.  Has been released.  Follow TSH.    

## 2013-08-22 ENCOUNTER — Ambulatory Visit: Payer: Self-pay | Admitting: Internal Medicine

## 2013-08-23 ENCOUNTER — Other Ambulatory Visit (INDEPENDENT_AMBULATORY_CARE_PROVIDER_SITE_OTHER): Payer: 59

## 2013-08-23 DIAGNOSIS — E7800 Pure hypercholesterolemia, unspecified: Secondary | ICD-10-CM

## 2013-08-23 DIAGNOSIS — E119 Type 2 diabetes mellitus without complications: Secondary | ICD-10-CM

## 2013-08-23 DIAGNOSIS — M8580 Other specified disorders of bone density and structure, unspecified site: Secondary | ICD-10-CM

## 2013-08-23 DIAGNOSIS — I10 Essential (primary) hypertension: Secondary | ICD-10-CM

## 2013-08-23 DIAGNOSIS — E041 Nontoxic single thyroid nodule: Secondary | ICD-10-CM

## 2013-08-23 LAB — COMPREHENSIVE METABOLIC PANEL
AST: 17 U/L (ref 0–37)
Albumin: 3.9 g/dL (ref 3.5–5.2)
Alkaline Phosphatase: 66 U/L (ref 39–117)
BUN: 12 mg/dL (ref 6–23)
Calcium: 9.1 mg/dL (ref 8.4–10.5)
Chloride: 108 mEq/L (ref 96–112)
Glucose, Bld: 142 mg/dL — ABNORMAL HIGH (ref 70–99)
Potassium: 4.1 mEq/L (ref 3.5–5.1)
Sodium: 139 mEq/L (ref 135–145)
Total Protein: 6.5 g/dL (ref 6.0–8.3)

## 2013-08-23 LAB — LIPID PANEL
Cholesterol: 152 mg/dL (ref 0–200)
HDL: 50.1 mg/dL (ref >39.00)
Total CHOL/HDL Ratio: 3
Triglycerides: 224 mg/dL — ABNORMAL HIGH (ref 0.0–149.0)
VLDL: 44.8 mg/dL — ABNORMAL HIGH (ref 0.0–40.0)

## 2013-08-24 ENCOUNTER — Encounter: Payer: Self-pay | Admitting: Internal Medicine

## 2013-08-24 LAB — VITAMIN D 25 HYDROXY (VIT D DEFICIENCY, FRACTURES): Vit D, 25-Hydroxy: 30 ng/mL (ref 30–89)

## 2013-08-27 ENCOUNTER — Encounter: Payer: Self-pay | Admitting: Internal Medicine

## 2013-10-08 ENCOUNTER — Other Ambulatory Visit: Payer: Self-pay | Admitting: Internal Medicine

## 2013-11-05 ENCOUNTER — Other Ambulatory Visit: Payer: Self-pay | Admitting: Internal Medicine

## 2013-11-05 ENCOUNTER — Other Ambulatory Visit (INDEPENDENT_AMBULATORY_CARE_PROVIDER_SITE_OTHER): Payer: 59

## 2013-11-05 DIAGNOSIS — Z1211 Encounter for screening for malignant neoplasm of colon: Secondary | ICD-10-CM

## 2013-11-05 LAB — FECAL OCCULT BLOOD, IMMUNOCHEMICAL: Fecal Occult Bld: NEGATIVE

## 2013-11-06 ENCOUNTER — Encounter: Payer: Self-pay | Admitting: Internal Medicine

## 2013-11-16 ENCOUNTER — Other Ambulatory Visit: Payer: Self-pay | Admitting: Internal Medicine

## 2013-12-11 ENCOUNTER — Ambulatory Visit: Payer: 59 | Admitting: Internal Medicine

## 2014-01-30 ENCOUNTER — Encounter: Payer: Self-pay | Admitting: Internal Medicine

## 2014-01-30 ENCOUNTER — Ambulatory Visit (INDEPENDENT_AMBULATORY_CARE_PROVIDER_SITE_OTHER): Payer: 59 | Admitting: Internal Medicine

## 2014-01-30 VITALS — BP 130/62 | HR 79 | Resp 16 | Ht 61.5 in | Wt 181.2 lb

## 2014-01-30 DIAGNOSIS — M899 Disorder of bone, unspecified: Secondary | ICD-10-CM

## 2014-01-30 DIAGNOSIS — M949 Disorder of cartilage, unspecified: Secondary | ICD-10-CM

## 2014-01-30 DIAGNOSIS — E7800 Pure hypercholesterolemia, unspecified: Secondary | ICD-10-CM

## 2014-01-30 DIAGNOSIS — D469 Myelodysplastic syndrome, unspecified: Secondary | ICD-10-CM

## 2014-01-30 DIAGNOSIS — E041 Nontoxic single thyroid nodule: Secondary | ICD-10-CM

## 2014-01-30 DIAGNOSIS — Z9109 Other allergy status, other than to drugs and biological substances: Secondary | ICD-10-CM

## 2014-01-30 DIAGNOSIS — I10 Essential (primary) hypertension: Secondary | ICD-10-CM

## 2014-01-30 DIAGNOSIS — E119 Type 2 diabetes mellitus without complications: Secondary | ICD-10-CM

## 2014-01-30 DIAGNOSIS — M8580 Other specified disorders of bone density and structure, unspecified site: Secondary | ICD-10-CM

## 2014-01-30 NOTE — Progress Notes (Signed)
Pre visit review using our clinic review tool, if applicable. No additional management support is needed unless otherwise documented below in the visit note. 

## 2014-02-02 ENCOUNTER — Encounter: Payer: Self-pay | Admitting: Internal Medicine

## 2014-02-02 NOTE — Assessment & Plan Note (Signed)
On metformin1000 bid.  Continue diet adjustment and exercise.  Follow.  Check a1c with next labs.  Sugars as outlined.  Hold on additional medication.

## 2014-02-02 NOTE — Assessment & Plan Note (Signed)
Controlled.  

## 2014-02-02 NOTE — Assessment & Plan Note (Signed)
Worked up by Dr Gerkin.  Has been released.  Follow TSH.    

## 2014-02-02 NOTE — Assessment & Plan Note (Signed)
Followed by Dr Powell.  Counts have been stable.  Follow.  

## 2014-02-02 NOTE — Assessment & Plan Note (Signed)
Low cholesterol diet and exercise.  Follow fasting lipid profile.   

## 2014-02-02 NOTE — Assessment & Plan Note (Signed)
Bone density revealed osteopenia.  Continue vitamin D.  Follow.   

## 2014-02-02 NOTE — Assessment & Plan Note (Signed)
Blood pressure doing well.  Same medication regimen.  Follow metabolic panel.   

## 2014-02-02 NOTE — Progress Notes (Signed)
Subjective:    Patient ID: Kara Griffin, female    DOB: [birth year], 78 y.o.   MRN: [id number]  HPI 78 year old female with past history of myelodysplastic syndrome, anemia, diabetes and hypertension who comes in today for a scheduled follow up.  Her am sugars are averaging 140-170s and pm sugars 160-200.  Trying to watch what she eats.  No nausea or vomiting.   Blood pressure doing well.  Still seeing Dr Florene Glen.  Counts have been stable.  Overall she feels she is doing well.   No chest pain or tightness.  Breathing stable.  No headache.   She cut out/down wheat in her diet.  Arthritis better.  Energy better.  Bowels flare intermittently.  Takes immodium prn.     Past Medical History  Diagnosis Date  . Hypertension   . Diabetes mellitus   . Hypercholesterolemia   . Environmental allergies   . Myelodysplastic syndrome     Sees Dr Jerrye Noble  . Anemia     Current Outpatient Prescriptions on File Prior to Visit  Medication Sig Dispense Refill  . aspirin 81 MG tablet Take 81 mg by mouth daily.      Marland Kitchen BAYER CONTOUR TEST test strip USE TWICE DAILY  50 each  11  . Calcium Carbonate-Vitamin D (CALCIUM 600+D) 600-400 MG-UNIT per tablet Take 1 tablet by mouth daily.       . fish oil-omega-3 fatty acids 1000 MG capsule Take 1 g by mouth daily.       Marland Kitchen lenalidomide (REVLIMID) 10 MG capsule Take 10 mg by mouth daily.      Marland Kitchen losartan (COZAAR) 50 MG tablet TAKE 1 TABLET BY MOUTH EVERY DAY  30 tablet  4  . metFORMIN (GLUCOPHAGE) 1000 MG tablet TAKE 1 TABLET BY MOUTH TWICE DAILY WITH MEALS  60 tablet  5  . Multiple Vitamin (MULTIVITAMIN) tablet Take 1 tablet by mouth daily.       No current facility-administered medications on file prior to visit.    Review of Systems Patient denies any headache.   No significant allergy or sinus symptoms.  No chest pain, tightness or palpitations.  No increased shortness of breath, cough or congestion.  No nausea or vomiting.  No acid reflux.  No abdominal  pain or cramping.  No BRBPR or melana. Bowels flare intermittently.   No urine change.  Joints better.   Sugars as outlined.  See attached.         Objective:   Physical Exam  Filed Vitals:   01/30/14 1137  BP: 130/62  Pulse: 79  Resp: 16   Pulse 84, blood pressure recheck:  41/46  78 year old female in no acute distress.   HEENT:  Nares- clear.  Oropharynx - without lesions. NECK:  Supple.  Nontender.  No audible bruit.  HEART:  Appears to be regular. LUNGS:  No crackles or wheezing audible.  Respirations even and unlabored.  RADIAL PULSE:  Equal bilaterally.  ABDOMEN:  Soft, nontender.  Bowel sounds present and normal.  No audible abdominal bruit.    EXTREMITIES:  No increased edema present.  DP pulses palpable and equal bilaterally.  FEET:  No lesions.          Assessment & Plan:  GI.  Colonoscopy 08/05/06 - internal nonbleeding hemorrhoids.  Bowels doing better on metformin and since she adjusted her diet.   Follow.  IFOB 11/05/13 - negative.   HEALTH MAINTENANCE.  Physical 08/12/13.  Colonoscopy as outlined.  Pap 01/11/10 negative.  Mammogram 08/22/13 - BiRADS I.  IFOB 11/05/13 - negative.

## 2014-02-20 ENCOUNTER — Encounter: Payer: Self-pay | Admitting: Internal Medicine

## 2014-03-12 ENCOUNTER — Other Ambulatory Visit: Payer: Self-pay | Admitting: Internal Medicine

## 2014-04-08 ENCOUNTER — Other Ambulatory Visit: Payer: Self-pay | Admitting: Internal Medicine

## 2014-04-15 LAB — CBC AND DIFFERENTIAL
HCT: 40 % (ref 36–46)
Hemoglobin: 13.2 g/dL (ref 12.0–16.0)
Neutrophils Absolute: 2 /uL
PLATELETS: 127 10*3/uL — AB (ref 150–399)
WBC: 3.5 10^3/mL

## 2014-04-15 LAB — HEPATIC FUNCTION PANEL
ALK PHOS: 73 U/L (ref 25–125)
ALT: 26 U/L (ref 7–35)
AST: 19 U/L (ref 13–35)
Bilirubin, Total: 0.4 mg/dL

## 2014-04-15 LAB — BASIC METABOLIC PANEL
BUN: 14 mg/dL (ref 4–21)
CREATININE: 0.7 mg/dL (ref 0.5–1.1)
Glucose: 125 mg/dL
POTASSIUM: 3.8 mmol/L (ref 3.4–5.3)
Sodium: 137 mmol/L (ref 137–147)

## 2014-04-21 ENCOUNTER — Encounter: Payer: Self-pay | Admitting: Internal Medicine

## 2014-05-02 ENCOUNTER — Other Ambulatory Visit: Payer: Self-pay | Admitting: Internal Medicine

## 2014-05-28 ENCOUNTER — Ambulatory Visit: Payer: Self-pay | Admitting: Ophthalmology

## 2014-06-02 ENCOUNTER — Encounter: Payer: Self-pay | Admitting: Internal Medicine

## 2014-06-02 ENCOUNTER — Ambulatory Visit (INDEPENDENT_AMBULATORY_CARE_PROVIDER_SITE_OTHER): Payer: 59 | Admitting: Internal Medicine

## 2014-06-02 VITALS — BP 130/70 | HR 67 | Temp 97.9°F | Ht 61.5 in | Wt 181.2 lb

## 2014-06-02 DIAGNOSIS — E119 Type 2 diabetes mellitus without complications: Secondary | ICD-10-CM

## 2014-06-02 DIAGNOSIS — M8580 Other specified disorders of bone density and structure, unspecified site: Secondary | ICD-10-CM

## 2014-06-02 DIAGNOSIS — Z23 Encounter for immunization: Secondary | ICD-10-CM

## 2014-06-02 DIAGNOSIS — I10 Essential (primary) hypertension: Secondary | ICD-10-CM

## 2014-06-02 DIAGNOSIS — M899 Disorder of bone, unspecified: Secondary | ICD-10-CM

## 2014-06-02 DIAGNOSIS — E041 Nontoxic single thyroid nodule: Secondary | ICD-10-CM

## 2014-06-02 DIAGNOSIS — M949 Disorder of cartilage, unspecified: Secondary | ICD-10-CM

## 2014-06-02 DIAGNOSIS — E7800 Pure hypercholesterolemia, unspecified: Secondary | ICD-10-CM

## 2014-06-02 DIAGNOSIS — Z9109 Other allergy status, other than to drugs and biological substances: Secondary | ICD-10-CM

## 2014-06-02 DIAGNOSIS — D469 Myelodysplastic syndrome, unspecified: Secondary | ICD-10-CM

## 2014-06-02 NOTE — Progress Notes (Signed)
Subjective:    Patient ID: Kara Griffin, female    DOB: 12-09-1935, 78 y.o.   MRN: [id number]  HPI 78 year old female with past history of myelodysplastic syndrome, anemia, diabetes and hypertension who comes in today for a scheduled follow up.  Her am sugars are averaging 140-170s and pm sugars 160-200.  Trying to watch what she eats.  Taking metformin.  No nausea or vomiting.   Blood pressure doing well.  Still seeing Dr Florene Glen.  Counts have been slightly decreased recently.   Overall she feels she is doing well.   No chest pain or tightness.  Breathing stable.  No headache.   Bowels flare intermittently.  Takes immodium prn.  She now has hearing aids.  Adjusting well.  S/p cataract surgery - right eye.     Past Medical History  Diagnosis Date  . Hypertension   . Diabetes mellitus   . Hypercholesterolemia   . Environmental allergies   . Myelodysplastic syndrome     Sees Dr Jerrye Noble  . Anemia     Current Outpatient Prescriptions on File Prior to Visit  Medication Sig Dispense Refill  . aspirin 81 MG tablet Take 81 mg by mouth daily.      Marland Kitchen BAYER CONTOUR TEST test strip USE AS DIRECTED TO TEST BLOOD SUGAR TWICE DAILY  50 each  3  . Calcium Carbonate-Vitamin D (CALCIUM 600+D) 600-400 MG-UNIT per tablet Take 1 tablet by mouth daily.       . fish oil-omega-3 fatty acids 1000 MG capsule Take 1 g by mouth daily.       Marland Kitchen lenalidomide (REVLIMID) 10 MG capsule Take 10 mg by mouth daily.      Marland Kitchen losartan (COZAAR) 50 MG tablet TAKE 1 TABLET BY MOUTH EVERY DAY  30 tablet  5  . metFORMIN (GLUCOPHAGE) 1000 MG tablet TAKE 1 TABLET BY MOUTH TWICE DAILY WITH MEALS  60 tablet  0  . Multiple Vitamin (MULTIVITAMIN) tablet Take 1 tablet by mouth daily.       No current facility-administered medications on file prior to visit.    Review of Systems Patient denies any headache.   No significant allergy or sinus symptoms.  No chest pain, tightness or palpitations.  No increased shortness of  breath, cough or congestion.  No nausea or vomiting.  No acid reflux.  No abdominal pain or cramping.  No BRBPR or melana. Bowels flare intermittently.   No urine change.  Joints better.   Sugars as outlined.  See attached.  Still followed by Dr Florene Glen.  Counts slightly decreased recently.       Objective:   Physical Exam  Filed Vitals:   06/02/14 1105  BP: 130/70  Pulse: 67  Temp: 97.9 F (36.6 C)   blood pressure recheck:  81/64  78 year old female in no acute distress.   HEENT:  Nares- clear.  Oropharynx - without lesions. NECK:  Supple.  Nontender.  No audible bruit.  HEART:  Appears to be regular. LUNGS:  No crackles or wheezing audible.  Respirations even and unlabored.  RADIAL PULSE:  Equal bilaterally.  ABDOMEN:  Soft, nontender.  Bowel sounds present and normal.  No audible abdominal bruit.    EXTREMITIES:  No increased edema present.  DP pulses palpable and equal bilaterally.  FEET:  No lesions.          Assessment & Plan:  GI.  Colonoscopy 08/05/06 - internal nonbleeding hemorrhoids.  Bowels doing better on metformin  and since she adjusted her diet.   Follow.  IFOB 11/05/13 - negative.   HEALTH MAINTENANCE.  Physical 08/12/13.  Colonoscopy as outlined.  Pap 01/11/10 negative.  Mammogram 08/22/13 - BiRADS I.  IFOB 11/05/13 - negative.   I spent 25 minutes with the patient and more than 50% of the time was spent in consultation regarding the above.

## 2014-06-02 NOTE — Progress Notes (Signed)
Pre visit review using our clinic review tool, if applicable. No additional management support is needed unless otherwise documented below in the visit note. 

## 2014-06-03 ENCOUNTER — Encounter: Payer: Self-pay | Admitting: Internal Medicine

## 2014-06-03 ENCOUNTER — Other Ambulatory Visit: Payer: Self-pay | Admitting: Internal Medicine

## 2014-06-03 NOTE — Assessment & Plan Note (Signed)
Controlled.  Breathing stable.  

## 2014-06-03 NOTE — Assessment & Plan Note (Signed)
Low cholesterol diet and exercise.  Follow fasting lipid profile.   

## 2014-06-03 NOTE — Assessment & Plan Note (Signed)
On metformin1000 bid.  Continue diet adjustment and exercise.  We discussed diet today.  Eat regular meals.  Follow.  Check a1c with next labs at [facility] as outlined.  Hold on additional medication.

## 2014-06-03 NOTE — Assessment & Plan Note (Signed)
Worked up by Dr Harlow Asa.  Has been released.  Follow TSH.

## 2014-06-03 NOTE — Assessment & Plan Note (Signed)
Bone density revealed osteopenia.  Continue vitamin D.  Follow.

## 2014-06-03 NOTE — Assessment & Plan Note (Signed)
Followed by Dr Florene Glen.  Counts have decreased some recently.  They have been monitoring more closely.  Follow.

## 2014-06-03 NOTE — Assessment & Plan Note (Signed)
Blood pressure doing well.  Same medication regimen.  Follow metabolic panel.   

## 2014-06-10 LAB — BASIC METABOLIC PANEL
CREATININE: 0.8 mg/dL (ref 0.5–1.1)
GLUCOSE: 124 mg/dL
Potassium: 3.9 mmol/L (ref 3.4–5.3)
Sodium: 136 mmol/L — AB (ref 137–147)

## 2014-06-10 LAB — HEPATIC FUNCTION PANEL
ALT: 24 U/L (ref 7–35)
AST: 18 U/L (ref 13–35)
Alkaline Phosphatase: 77 U/L (ref 25–125)
Bilirubin, Total: 0.8 mg/dL

## 2014-06-10 LAB — HEMOGLOBIN A1C: Hgb A1c MFr Bld: 6.3 % — AB (ref 4.0–6.0)

## 2014-06-10 LAB — CBC AND DIFFERENTIAL
HCT: 40 % (ref 36–46)
Hemoglobin: 13.5 g/dL (ref 12.0–16.0)
Platelets: 133 10*3/uL — AB (ref 150–399)
WBC: 4.2 10^3/mL

## 2014-06-16 ENCOUNTER — Encounter: Payer: Self-pay | Admitting: Internal Medicine

## 2014-06-27 ENCOUNTER — Other Ambulatory Visit: Payer: Self-pay | Admitting: Internal Medicine

## 2014-09-01 ENCOUNTER — Other Ambulatory Visit: Payer: Self-pay

## 2014-09-01 NOTE — Telephone Encounter (Signed)
The patient called and is hoping to get her test strips and meter sent to the pharmacy by Jan 1.  She states insurance will cover the cost if it sent in before this date.  ([facility] in [place])   [place] - [phone]

## 2014-09-01 NOTE — Telephone Encounter (Signed)
Spoke to patient, states she dropped off information regarding what meters her insurance will cover, does not still have the letter, states she chose one that looked easy to use. Advised to call her insurance and ask which one would be the easiest for her to use and would be covered and to call us back so we can send in correct one.

## 2014-09-08 ENCOUNTER — Telehealth: Payer: Self-pay | Admitting: *Deleted

## 2014-09-08 ENCOUNTER — Other Ambulatory Visit (INDEPENDENT_AMBULATORY_CARE_PROVIDER_SITE_OTHER): Payer: Medicare Other

## 2014-09-08 ENCOUNTER — Other Ambulatory Visit: Payer: Self-pay | Admitting: *Deleted

## 2014-09-08 DIAGNOSIS — M8580 Other specified disorders of bone density and structure, unspecified site: Secondary | ICD-10-CM

## 2014-09-08 DIAGNOSIS — E7800 Pure hypercholesterolemia, unspecified: Secondary | ICD-10-CM

## 2014-09-08 DIAGNOSIS — E041 Nontoxic single thyroid nodule: Secondary | ICD-10-CM

## 2014-09-08 DIAGNOSIS — E119 Type 2 diabetes mellitus without complications: Secondary | ICD-10-CM

## 2014-09-08 DIAGNOSIS — Z9109 Other allergy status, other than to drugs and biological substances: Secondary | ICD-10-CM

## 2014-09-08 DIAGNOSIS — I10 Essential (primary) hypertension: Secondary | ICD-10-CM

## 2014-09-08 MED ORDER — ONETOUCH LANCETS MISC: Status: DC

## 2014-09-08 MED ORDER — GLUCOSE BLOOD VI STRP: ORAL_STRIP | Status: DC

## 2014-09-08 NOTE — Telephone Encounter (Signed)
Labs and dx?  

## 2014-09-08 NOTE — Telephone Encounter (Signed)
Orders placed for labs

## 2014-09-09 ENCOUNTER — Encounter: Payer: Self-pay | Admitting: Internal Medicine

## 2014-09-09 LAB — LIPID PANEL
CHOL/HDL RATIO: 3
Cholesterol: 151 mg/dL (ref 0–200)
HDL: 46.6 mg/dL (ref >39.00)
LDL CALC: 73 mg/dL (ref 0–99)
NonHDL: 104.4
Triglycerides: 157 mg/dL — ABNORMAL HIGH (ref 0.0–149.0)
VLDL: 31.4 mg/dL (ref 0.0–40.0)

## 2014-09-09 LAB — TSH: TSH: 1.29 u[IU]/mL (ref 0.35–4.50)

## 2014-09-09 LAB — HEPATIC FUNCTION PANEL
ALBUMIN: 3.8 g/dL (ref 3.5–5.2)
ALT: 19 U/L (ref 0–35)
AST: 16 U/L (ref 0–37)
Alkaline Phosphatase: 77 U/L (ref 39–117)
Bilirubin, Direct: 0.1 mg/dL (ref 0.0–0.3)
TOTAL PROTEIN: 6.6 g/dL (ref 6.0–8.3)
Total Bilirubin: 0.7 mg/dL (ref 0.2–1.2)

## 2014-09-09 LAB — BASIC METABOLIC PANEL
BUN: 11 mg/dL (ref 6–23)
CO2: 26 mEq/L (ref 19–32)
Calcium: 9 mg/dL (ref 8.4–10.5)
Chloride: 107 mEq/L (ref 96–112)
Creatinine, Ser: 0.7 mg/dL (ref 0.4–1.2)
GFR: 86.25 mL/min (ref >60.00)
Glucose, Bld: 155 mg/dL — ABNORMAL HIGH (ref 70–99)
POTASSIUM: 3.9 meq/L (ref 3.5–5.1)
Sodium: 139 mEq/L (ref 135–145)

## 2014-09-09 LAB — HEMOGLOBIN A1C: Hgb A1c MFr Bld: 6.9 % — ABNORMAL HIGH (ref 4.6–6.5)

## 2014-09-09 LAB — VITAMIN D 25 HYDROXY (VIT D DEFICIENCY, FRACTURES): VITD: 17 ng/mL — ABNORMAL LOW (ref 30.00–100.00)

## 2014-09-12 ENCOUNTER — Ambulatory Visit (INDEPENDENT_AMBULATORY_CARE_PROVIDER_SITE_OTHER): Payer: Medicare Other | Admitting: Internal Medicine

## 2014-09-12 ENCOUNTER — Encounter: Payer: Self-pay | Admitting: Internal Medicine

## 2014-09-12 VITALS — BP 122/70 | HR 77 | Temp 98.2°F | Ht 61.25 in | Wt 175.5 lb

## 2014-09-12 DIAGNOSIS — E2839 Other primary ovarian failure: Secondary | ICD-10-CM

## 2014-09-12 DIAGNOSIS — E119 Type 2 diabetes mellitus without complications: Secondary | ICD-10-CM

## 2014-09-12 DIAGNOSIS — E7800 Pure hypercholesterolemia, unspecified: Secondary | ICD-10-CM

## 2014-09-12 DIAGNOSIS — E041 Nontoxic single thyroid nodule: Secondary | ICD-10-CM

## 2014-09-12 DIAGNOSIS — D469 Myelodysplastic syndrome, unspecified: Secondary | ICD-10-CM

## 2014-09-12 DIAGNOSIS — Z1239 Encounter for other screening for malignant neoplasm of breast: Secondary | ICD-10-CM

## 2014-09-12 DIAGNOSIS — I10 Essential (primary) hypertension: Secondary | ICD-10-CM

## 2014-09-12 DIAGNOSIS — E669 Obesity, unspecified: Secondary | ICD-10-CM

## 2014-09-12 DIAGNOSIS — M8580 Other specified disorders of bone density and structure, unspecified site: Secondary | ICD-10-CM

## 2014-09-12 NOTE — Progress Notes (Signed)
Pre visit review using our clinic review tool, if applicable. No additional management support is needed unless otherwise documented below in the visit note. 

## 2014-09-14 ENCOUNTER — Encounter: Payer: Self-pay | Admitting: Internal Medicine

## 2014-09-14 DIAGNOSIS — Z6833 Body mass index (BMI) 33.0-33.9, adult: Secondary | ICD-10-CM | POA: Insufficient documentation

## 2014-09-14 NOTE — Progress Notes (Signed)
Subjective:    Patient ID: Kara Griffin, female    DOB: 1935-09-26, 79 y.o.   MRN: [id number]  HPI 79 year old female with past history of myelodysplastic syndrome, anemia, diabetes and hypertension who comes in today to follow up on these issues as well as for a complete physical exam.  Her am sugars are averaging 160-200s and pm sugars 170-240.  Trying to watch what she eats.  Taking metformin.  No nausea or vomiting.   Blood pressure doing well.  Still seeing Dr Florene Glen.  Counts have been relatively stable.  Overall she feels she is doing well.   No chest pain or tightness.  Breathing stable.  No headache.   Bowels flare intermittently.  Takes immodium prn.  Previous back and left leg pain.  Better.  Overall she feels she is dong well.      Past Medical History  Diagnosis Date  . Hypertension   . Diabetes mellitus   . Hypercholesterolemia   . Environmental allergies   . Myelodysplastic syndrome     Sees Dr Jerrye Noble  . Anemia     Current Outpatient Prescriptions on File Prior to Visit  Medication Sig Dispense Refill  . aspirin 81 MG tablet Take 81 mg by mouth daily.    . Calcium Carbonate-Vitamin D (CALCIUM 600+D) 600-400 MG-UNIT per tablet Take 1 tablet by mouth daily.     . fish oil-omega-3 fatty acids 1000 MG capsule Take 1 g by mouth daily.     Marland Kitchen glucose blood test strip Use to check sugar 1-2 times a day Dx. E11.9 (One Touch Ultra Mini) 100 each 12  . lenalidomide (REVLIMID) 10 MG capsule Take 10 mg by mouth daily.    Marland Kitchen losartan (COZAAR) 50 MG tablet TAKE 1 TABLET BY MOUTH EVERY DAY 30 tablet 5  . metFORMIN (GLUCOPHAGE) 1000 MG tablet TAKE 1 TABLET BY MOUTH TWICE DAILY WITH MEALS 60 tablet 5  . Multiple Vitamin (MULTIVITAMIN) tablet Take 1 tablet by mouth daily.    . ONE TOUCH LANCETS MISC Use to check sugars 1-2 times a day Dx. E11.9 (One Touch Ultra Mini) 100 each 3   No current facility-administered medications on file prior to visit.    Review of Systems Patient  denies any headache.   No significant allergy or sinus symptoms.  No chest pain, tightness or palpitations.  No increased shortness of breath, cough or congestion.  No nausea or vomiting.  No acid reflux.  No abdominal pain or cramping.  No BRBPR or melana. Bowels flare intermittently.   No urine change.  Sugars as outlined.  See attached.  Still followed by Dr Florene Glen.  Counts stable.  Some fatigue.        Objective:   Physical Exam  Filed Vitals:   09/12/14 1034  BP: 122/70  Pulse: 77  Temp: 98.2 F (36.8 C)   blood pressure recheck:  22/28  79 year old female in no acute distress.   HEENT:  Nares- clear.  Oropharynx - without lesions. NECK:  Supple.  Nontender.  No audible bruit.  HEART:  Appears to be regular. LUNGS:  No crackles or wheezing audible.  Respirations even and unlabored.  RADIAL PULSE:  Equal bilaterally.    BREASTS:  No nipple discharge or nipple retraction present.  Could not appreciate any distinct nodules or axillary adenopathy.  ABDOMEN:  Soft, nontender.  Bowel sounds present and normal.  No audible abdominal bruit.  GU:  Not performed.   EXTREMITIES:  No increased edema present.  DP pulses palpable and equal bilaterally.  FEET:  No lesions.          Assessment & Plan:  1. Estrogen deficiency  - DG Bone Density; Future  2. Breast cancer screening - MM DIGITAL SCREENING BILATERAL; Future  3. Obesity (BMI 30-39.9) Diet and exercise.    4. Essential hypertension Blood pressure doing well on our checks.  Follow.  Same medication regimen.   5. Type 2 diabetes mellitus without complication Sugars as outlined.  A1c 6.9.  Feel the hemolytic anemia affecting the reading on the a1c.  Continue metformin.  D/w endocrinology regarding medication change.    6. Thyroid nodule Worked up by Dr Harlow Asa.  Has been released.  Follow tsh.    7. Myelodysplastic syndrome Followed by Dr Florene Glen.  Stable.   8. Osteopenia Schedule f/u bone density.  Follow vitamin D  level.    9. Hypercholesterolemia Low fat/low cholesterol diet.  Follow lipid panel.   Lab Results  Component Value Date   CHOL 151 09/08/2014   HDL 46.60 09/08/2014   LDLCALC 73 09/08/2014   LDLDIRECT 85.3 08/23/2013   TRIG 157.0* 09/08/2014   CHOLHDL 3 09/08/2014   10. GI.  Colonoscopy 08/05/06 - internal nonbleeding hemorrhoids.  Bowels doing better on metformin and since she adjusted her diet.   Follow.  IFOB 11/05/13 - negative.   HEALTH MAINTENANCE.  Physical today.  Colonoscopy as outlined.  Pap 01/11/10 negative.  Mammogram 08/22/13 - BiRADS I.  IFOB 11/05/13 - negative.  Schedule f/u mammogram and bone density.    I spent 25 minutes with the patient and more than 50% of the time was spent in consultation regarding the above.

## 2014-09-23 ENCOUNTER — Ambulatory Visit: Payer: Self-pay | Admitting: Internal Medicine

## 2014-09-23 LAB — HM DEXA SCAN

## 2014-09-23 LAB — HM MAMMOGRAPHY: HM Mammogram: NEGATIVE

## 2014-09-24 ENCOUNTER — Encounter: Payer: Self-pay | Admitting: Internal Medicine

## 2014-10-03 ENCOUNTER — Encounter: Payer: Self-pay | Admitting: Internal Medicine

## 2014-10-05 ENCOUNTER — Telehealth: Payer: Self-pay | Admitting: Internal Medicine

## 2014-10-05 NOTE — Telephone Encounter (Signed)
Pt notified of bone density results via my chart.   Osteopenia.

## 2014-10-06 ENCOUNTER — Other Ambulatory Visit: Payer: Self-pay | Admitting: Internal Medicine

## 2014-10-06 ENCOUNTER — Encounter: Payer: Self-pay | Admitting: Internal Medicine

## 2014-10-13 ENCOUNTER — Telehealth: Payer: Self-pay | Admitting: Internal Medicine

## 2014-10-13 NOTE — Telephone Encounter (Signed)
Pt notified of sugar results and recs via my chart.  Stay on same medication.

## 2014-11-03 ENCOUNTER — Other Ambulatory Visit: Payer: Self-pay | Admitting: Internal Medicine

## 2014-12-01 ENCOUNTER — Other Ambulatory Visit: Payer: Self-pay | Admitting: Internal Medicine

## 2015-01-16 ENCOUNTER — Ambulatory Visit (INDEPENDENT_AMBULATORY_CARE_PROVIDER_SITE_OTHER): Payer: Medicare Other | Admitting: Internal Medicine

## 2015-01-16 ENCOUNTER — Encounter: Payer: Self-pay | Admitting: Internal Medicine

## 2015-01-16 VITALS — BP 128/62 | HR 71 | Temp 98.0°F | Ht 61.25 in | Wt 181.2 lb

## 2015-01-16 DIAGNOSIS — E041 Nontoxic single thyroid nodule: Secondary | ICD-10-CM | POA: Diagnosis not present

## 2015-01-16 DIAGNOSIS — I10 Essential (primary) hypertension: Secondary | ICD-10-CM

## 2015-01-16 DIAGNOSIS — M8580 Other specified disorders of bone density and structure, unspecified site: Secondary | ICD-10-CM

## 2015-01-16 DIAGNOSIS — R42 Dizziness and giddiness: Secondary | ICD-10-CM | POA: Diagnosis not present

## 2015-01-16 DIAGNOSIS — Z91048 Other nonmedicinal substance allergy status: Secondary | ICD-10-CM

## 2015-01-16 DIAGNOSIS — E7800 Pure hypercholesterolemia, unspecified: Secondary | ICD-10-CM

## 2015-01-16 DIAGNOSIS — D469 Myelodysplastic syndrome, unspecified: Secondary | ICD-10-CM

## 2015-01-16 DIAGNOSIS — Z0000 Encounter for general adult medical examination without abnormal findings: Secondary | ICD-10-CM

## 2015-01-16 DIAGNOSIS — E669 Obesity, unspecified: Secondary | ICD-10-CM

## 2015-01-16 DIAGNOSIS — E119 Type 2 diabetes mellitus without complications: Secondary | ICD-10-CM | POA: Diagnosis not present

## 2015-01-16 DIAGNOSIS — Z9109 Other allergy status, other than to drugs and biological substances: Secondary | ICD-10-CM

## 2015-01-16 LAB — BASIC METABOLIC PANEL
BUN: 14 mg/dL (ref 6–23)
CO2: 30 meq/L (ref 19–32)
CREATININE: 0.67 mg/dL (ref 0.40–1.20)
Calcium: 9.4 mg/dL (ref 8.4–10.5)
Chloride: 102 mEq/L (ref 96–112)
GFR: 90.64 mL/min (ref >60.00)
Glucose, Bld: 121 mg/dL — ABNORMAL HIGH (ref 70–99)
POTASSIUM: 4.1 meq/L (ref 3.5–5.1)
Sodium: 136 mEq/L (ref 135–145)

## 2015-01-16 LAB — MICROALBUMIN / CREATININE URINE RATIO
CREATININE, U: 81.6 mg/dL
MICROALB UR: 1 mg/dL (ref 0.0–1.9)
Microalb Creat Ratio: 1.2 mg/g (ref 0.0–30.0)

## 2015-01-16 LAB — HEMOGLOBIN A1C: HEMOGLOBIN A1C: 6.3 % (ref 4.6–6.5)

## 2015-01-16 NOTE — Progress Notes (Signed)
Patient ID: Kara Griffin, female   DOB: [birth year], 79 y.o.   MRN: [id number]   Subjective:    Patient ID: Kara Griffin, female    DOB: 04-28-1936, 79 y.o.   MRN: [id number]  HPI  Patient here for a scheduled follow up.  She is watching her diet.  Sugars are better.  Sugars averaging 120-140s in the am and 140-180 in the evening.  No cardiac symptoms reported.  Does report feeling intermittent dizziness.  None in the last couple of days. When occurs, is worse in the am.  Bending down and coming up aggravates.  Also reports some persistent drainage.  Using nasal sprays.  Does help some.  Bowels are overall better.  Still flare at times.     Past Medical History  Diagnosis Date  . Hypertension   . Diabetes mellitus   . Hypercholesterolemia   . Environmental allergies   . Myelodysplastic syndrome     Sees Dr Kara Griffin  . Anemia     Current Outpatient Prescriptions on File Prior to Visit  Medication Sig Dispense Refill  . aspirin 81 MG tablet Take 81 mg by mouth daily.    . Calcium Carbonate-Vitamin D (CALCIUM 600+D) 600-400 MG-UNIT per tablet Take 1 tablet by mouth daily.     . fish oil-omega-3 fatty acids 1000 MG capsule Take 1 g by mouth daily.     . fluticasone (FLONASE) 50 MCG/ACT nasal spray Place 2 sprays into both nostrils daily.    Marland Kitchen glucose blood test strip Use to check sugar 1-2 times a day Dx. E11.9 (One Touch Ultra Mini) 100 each 12  . lenalidomide (REVLIMID) 10 MG capsule Take 10 mg by mouth daily.    Marland Kitchen losartan (COZAAR) 50 MG tablet TAKE 1 TABLET BY MOUTH EVERY DAY 30 tablet 6  . metFORMIN (GLUCOPHAGE) 1000 MG tablet TAKE 1 TABLET BY MOUTH 2 TIMES A DAY 60 tablet 5  . Multiple Vitamin (MULTIVITAMIN) tablet Take 1 tablet by mouth daily.    . ONE TOUCH LANCETS MISC Use to check sugars 1-2 times a day Dx. E11.9 (One Touch Ultra Mini) 100 each 3   No current facility-administered medications on file prior to visit.    Review of Systems    Constitutional: Negative for appetite change and unexpected weight change.  HENT: Positive for postnasal drip. Negative for congestion and sinus pressure.   Respiratory: Negative for cough, chest tightness and shortness of breath.   Cardiovascular: Negative for chest pain and palpitations.  Gastrointestinal: Negative for nausea, vomiting, abdominal pain and diarrhea.  Neurological: Positive for dizziness (flares intermittently. ). Negative for headaches.  Psychiatric/Behavioral: Negative for dysphoric mood and agitation.       Objective:     Blood pressure recheck:  130/72  Physical Exam  Constitutional: She appears well-developed and well-nourished. No distress.  HENT:  Nose: Nose normal.  Mouth/Throat: Oropharynx is clear and moist.  Neck: Neck supple. No thyromegaly present.  Cardiovascular: Normal rate and regular rhythm.   Pulmonary/Chest: Breath sounds normal. No respiratory distress. She has no wheezes.  Abdominal: Soft. Bowel sounds are normal. There is no tenderness.  Musculoskeletal: She exhibits no tenderness.  ome stable pedal and ankle edema.     Lymphadenopathy:    She has no cervical adenopathy.  Skin: No rash noted. No erythema.  Psychiatric: She has a normal mood and affect. Her behavior is normal.    BP 128/62 mmHg  Pulse 71  Temp(Src) 98 F (36.7 C) (Oral)  Ht 5' 1.25" (1.556 m)  Wt 181 lb 4 oz (82.214 kg)  BMI 33.96 kg/m2  SpO2 95%  LMP 08/13/1984 Wt Readings from Last 3 Encounters:  01/16/15 181 lb 4 oz (82.214 kg)  09/12/14 175 lb 8 oz (79.606 kg)  06/02/14 181 lb 4 oz (82.214 kg)     Lab Results  Component Value Date   WBC 4.2 06/10/2014   HGB 13.5 06/10/2014   HCT 40 06/10/2014   PLT 133* 06/10/2014   GLUCOSE 121* 01/16/2015   CHOL 151 09/08/2014   TRIG 157.0* 09/08/2014   HDL 46.60 09/08/2014   LDLDIRECT 85.3 08/23/2013   LDLCALC 73 09/08/2014   ALT 19 09/08/2014   AST 16 09/08/2014   NA 136 01/16/2015   K 4.1 01/16/2015   CL  102 01/16/2015   CREATININE 0.67 01/16/2015   BUN 14 01/16/2015   CO2 30 01/16/2015   TSH 1.29 09/08/2014   HGBA1C 6.3 01/16/2015   MICROALBUR 1.0 01/16/2015       Assessment & Plan:   Problem List Items Addressed This Visit    Diabetes mellitus - Primary    On metformin.  Will continue.  Sugars better.  Continue diet and exercise.  Follow met b and a1c.       Relevant Orders   Basic metabolic panel (Completed)   Hemoglobin A1c (Completed)   Dizziness    Has noticed persistent intermittent dizziness and congestion despite medications.  Refer to ENT for evaluation.        Relevant Orders   Ambulatory referral to ENT   Environmental allergies    Controlled.  Breathing stable.       Health care maintenance    Physical 09/12/14.  Mammogram 09/23/14 - birads I.  Colonoscopy 08/05/06 - internal hemorrhoids.       Hypercholesterolemia    Low cholesterol diet and exercise.  Follow lipid panel.       Hypertension    Blood pressure doing well.  Same medication regimen.  Follow pressures.  Follow metabolic panel.       Myelodysplastic syndrome    Followed by Dr Kara Griffin.  Counts have been stable.  Follow.       Obesity (BMI 30-39.9)    Diet and exercise.  Follow.       Osteopenia    Continue vitamin D supplements and weight bearing exercise.       Thyroid nodule    Worked up by Dr Kara Griffin.  Follow tsh.         I spent 25 minutes with the patient and more than 50% of the time was spent in consultation regarding the above.     Kara Pheasant, MD

## 2015-01-16 NOTE — Progress Notes (Signed)
Pre visit review using our clinic review tool, if applicable. No additional management support is needed unless otherwise documented below in the visit note. 

## 2015-01-17 ENCOUNTER — Encounter: Payer: Self-pay | Admitting: Internal Medicine

## 2015-01-19 ENCOUNTER — Encounter: Payer: Self-pay | Admitting: Internal Medicine

## 2015-01-19 DIAGNOSIS — Z0000 Encounter for general adult medical examination without abnormal findings: Secondary | ICD-10-CM | POA: Insufficient documentation

## 2015-01-19 NOTE — Assessment & Plan Note (Signed)
Followed by Dr Florene Glen.  Counts have been stable.  Follow.

## 2015-01-19 NOTE — Assessment & Plan Note (Signed)
Low cholesterol diet and exercise.  Follow lipid panel.   

## 2015-01-19 NOTE — Assessment & Plan Note (Signed)
Blood pressure doing well.  Same medication regimen.  Follow pressures.  Follow metabolic panel.   

## 2015-01-19 NOTE — Assessment & Plan Note (Signed)
Diet and exercise.  Follow.  

## 2015-01-19 NOTE — Assessment & Plan Note (Signed)
On metformin.  Will continue.  Sugars better.  Continue diet and exercise.  Follow met b and a1c.

## 2015-01-19 NOTE — Assessment & Plan Note (Signed)
Worked up by Dr Gerkin.  Follow tsh.  

## 2015-01-19 NOTE — Assessment & Plan Note (Signed)
Continue vitamin D supplements and weight bearing exercise.   

## 2015-01-19 NOTE — Assessment & Plan Note (Signed)
Controlled.  Breathing stable.

## 2015-01-19 NOTE — Assessment & Plan Note (Signed)
Has noticed persistent intermittent dizziness and congestion despite medications.  Refer to ENT for evaluation.

## 2015-01-19 NOTE — Assessment & Plan Note (Signed)
Physical 09/12/14.  Mammogram 09/23/14 - birads I.  Colonoscopy 08/05/06 - internal hemorrhoids.

## 2015-02-24 LAB — HM DIABETES EYE EXAM

## 2015-02-26 ENCOUNTER — Encounter: Payer: Self-pay | Admitting: *Deleted

## 2015-05-22 ENCOUNTER — Ambulatory Visit (INDEPENDENT_AMBULATORY_CARE_PROVIDER_SITE_OTHER): Payer: Medicare Other | Admitting: Internal Medicine

## 2015-05-22 ENCOUNTER — Encounter: Payer: Self-pay | Admitting: Internal Medicine

## 2015-05-22 VITALS — BP 118/60 | HR 74 | Temp 97.6°F | Ht 61.25 in | Wt 179.2 lb

## 2015-05-22 DIAGNOSIS — E7800 Pure hypercholesterolemia, unspecified: Secondary | ICD-10-CM

## 2015-05-22 DIAGNOSIS — E119 Type 2 diabetes mellitus without complications: Secondary | ICD-10-CM

## 2015-05-22 DIAGNOSIS — R42 Dizziness and giddiness: Secondary | ICD-10-CM

## 2015-05-22 DIAGNOSIS — N898 Other specified noninflammatory disorders of vagina: Secondary | ICD-10-CM

## 2015-05-22 DIAGNOSIS — Z23 Encounter for immunization: Secondary | ICD-10-CM | POA: Diagnosis not present

## 2015-05-22 DIAGNOSIS — E669 Obesity, unspecified: Secondary | ICD-10-CM

## 2015-05-22 DIAGNOSIS — D469 Myelodysplastic syndrome, unspecified: Secondary | ICD-10-CM

## 2015-05-22 DIAGNOSIS — I10 Essential (primary) hypertension: Secondary | ICD-10-CM | POA: Diagnosis not present

## 2015-05-22 DIAGNOSIS — E041 Nontoxic single thyroid nodule: Secondary | ICD-10-CM

## 2015-05-22 DIAGNOSIS — M8580 Other specified disorders of bone density and structure, unspecified site: Secondary | ICD-10-CM

## 2015-05-22 MED ORDER — NYSTATIN 100000 UNIT/GM EX CREA: 1.0000 "application " | TOPICAL_CREAM | Freq: Two times a day (BID) | CUTANEOUS | Status: DC

## 2015-05-22 NOTE — Progress Notes (Signed)
Pre-visit discussion using our clinic review tool. No additional management support is needed unless otherwise documented below in the visit note.  

## 2015-05-22 NOTE — Progress Notes (Signed)
Patient ID: Kara Griffin, female   DOB: May 03, 1936, 79 y.o.   MRN: [id number]   Subjective:    Patient ID: Kara Griffin, female    DOB: March 19, 1936, 79 y.o.   MRN: [id number]  HPI  Patient here for a scheduled follow up.  Here to follow up on her blood pressure and her sugars.  The previous dizziness she was experiencing has resolved.  Not an issue for her now.  No headache.  Bowels are stable.  Blood pressure overall has been doing well.  AM sugars averaging 140-170 and pm sugars averaging 150-190.  On metformin.  Doing well with this medication.  Does request to have prescription cream to help with some perivaginal yeast.  No problems now.  Will flare intermittently.     Past Medical History  Diagnosis Date  . Hypertension   . Diabetes mellitus   . Hypercholesterolemia   . Environmental allergies   . Myelodysplastic syndrome     Sees Dr Jerrye Noble  . Anemia    Past Surgical History  Procedure Laterality Date  . Tonsilectomy, adenoidectomy, bilateral myringotomy and tubes  1963  . Cesarean section  1973  . Dilation and curettage of uterus    . Total abdominal hysterectomy w/ bilateral salpingoophorectomy  1985    secondary to abnormal cells and abnormal uterine bleeding   Family History  Problem Relation Age of Onset  . Heart disease Father     Deceased (MI) - 24  . Diabetes Mother     Deceased  . Asthma    . Diabetes Brother   . Heart disease Maternal Grandmother     myocardial infarction - 10  . Asthma Maternal Grandmother   . Heart disease Paternal Grandmother     myocardial infarction-52  . Thyroid cancer Mother   . Osteoarthritis Mother   . Heart disease Brother    Social History   Social History  . Marital Status: Divorced    Spouse Name: N/A  . Number of Children: 3  . Years of Education: N/A   Occupational History  . retired Games developer    Social History Main Topics  . Smoking status: Never Smoker   . Smokeless tobacco: Never Used    . Alcohol Use: No  . Drug Use: No  . Sexual Activity: Not Asked   Other Topics Concern  . None   Social History Narrative    Outpatient Encounter Prescriptions as of 05/22/2015  Medication Sig  . aspirin 81 MG tablet Take 81 mg by mouth daily.  . Calcium Carbonate-Vitamin D (CALCIUM 600+D) 600-400 MG-UNIT per tablet Take 1 tablet by mouth daily.   . fish oil-omega-3 fatty acids 1000 MG capsule Take 1 g by mouth daily.   . fluticasone (FLONASE) 50 MCG/ACT nasal spray Place 2 sprays into both nostrils daily.  Marland Kitchen glucose blood test strip Use to check sugar 1-2 times a day Dx. E11.9 (One Touch Ultra Mini)  . lenalidomide (REVLIMID) 10 MG capsule Take 10 mg by mouth daily.  Marland Kitchen losartan (COZAAR) 50 MG tablet TAKE 1 TABLET BY MOUTH EVERY DAY  . metFORMIN (GLUCOPHAGE) 1000 MG tablet TAKE 1 TABLET BY MOUTH 2 TIMES A DAY  . Multiple Vitamin (MULTIVITAMIN) tablet Take 1 tablet by mouth daily.  . ONE TOUCH LANCETS MISC Use to check sugars 1-2 times a day Dx. E11.9 (One Touch Ultra Mini)  . nystatin cream (MYCOSTATIN) Apply 1 application topically 2 (two) times daily.   No facility-administered encounter medications  on file as of 05/22/2015.    Review of Systems  Constitutional: Negative for appetite change and unexpected weight change.  HENT: Negative for congestion and sinus pressure.   Respiratory: Negative for cough, chest tightness and shortness of breath.   Cardiovascular: Negative for chest pain, palpitations and leg swelling.  Gastrointestinal: Negative for nausea, vomiting, abdominal pain and diarrhea.  Genitourinary: Negative for dysuria and difficulty urinating.  Musculoskeletal: Negative for back pain and joint swelling.  Skin: Negative for color change and rash.  Neurological: Negative for dizziness and headaches.       Dizziness resolved.   Psychiatric/Behavioral: Negative for dysphoric mood and agitation.       Objective:     Blood pressure rechecked by me:   132/72  Physical Exam  Constitutional: She appears well-developed and well-nourished. No distress.  HENT:  Nose: Nose normal.  Mouth/Throat: Oropharynx is clear and moist.  Eyes: Conjunctivae are normal. Right eye exhibits no discharge. Left eye exhibits no discharge.  Neck: Neck supple. No thyromegaly present.  Cardiovascular: Normal rate and regular rhythm.   Pulmonary/Chest: Breath sounds normal. No respiratory distress. She has no wheezes.  Abdominal: Soft. Bowel sounds are normal. There is no tenderness.  Musculoskeletal: She exhibits no edema or tenderness.  Lymphadenopathy:    She has no cervical adenopathy.  Skin: No rash noted. No erythema.  Psychiatric: She has a normal mood and affect. Her behavior is normal.    BP 118/60 mmHg  Pulse 74  Temp(Src) 97.6 F (36.4 C) (Oral)  Ht 5' 1.25" (1.556 m)  Wt 179 lb 4 oz (81.307 kg)  BMI 33.58 kg/m2  SpO2 96%  LMP 08/13/1984 Wt Readings from Last 3 Encounters:  05/22/15 179 lb 4 oz (81.307 kg)  01/16/15 181 lb 4 oz (82.214 kg)  09/12/14 175 lb 8 oz (79.606 kg)     Lab Results  Component Value Date   WBC 4.2 06/10/2014   HGB 13.5 06/10/2014   HCT 40 06/10/2014   PLT 133* 06/10/2014   GLUCOSE 121* 01/16/2015   CHOL 151 09/08/2014   TRIG 157.0* 09/08/2014   HDL 46.60 09/08/2014   LDLDIRECT 85.3 08/23/2013   LDLCALC 73 09/08/2014   ALT 19 09/08/2014   AST 16 09/08/2014   NA 136 01/16/2015   K 4.1 01/16/2015   CL 102 01/16/2015   CREATININE 0.67 01/16/2015   BUN 14 01/16/2015   CO2 30 01/16/2015   TSH 1.29 09/08/2014   HGBA1C 6.3 01/16/2015   MICROALBUR 1.0 01/16/2015       Assessment & Plan:   Problem List Items Addressed This Visit    Diabetes mellitus    Sugars as outlined.  On metformin.  Continue diet and exercise.  Up to date with eye exams.  Follow met b and a1c.       Relevant Orders   Hemoglobin D3T   Basic metabolic panel   Dizziness    Resolved.  Not an issue for her now.         Hypercholesterolemia    Low cholesterol diet and exercise.  Follow lipid panel.        Relevant Orders   Lipid panel   Hypertension    Blood pressure under good control.  Continue same medication regimen.  Follow pressures.  Follow metabolic panel.        Myelodysplastic syndrome    Followed by Dr Florene Glen.  Counts have been stable.        Relevant Orders  Hepatic function panel   Obesity (BMI 30-39.9)    Diet and exercise.        Osteopenia    Bone density 09/23/14 - osteopenia.  Continue weight bearing exercise.  Continue calcium and vitamin D.       Thyroid nodule    Worked up by Dr Harlow Asa.  Follow tsh.       Vaginal irritation    Perivaginal irritation.  Nystatin cream as needed.         Other Visit Diagnoses    Encounter for immunization    -  Primary        Einar Pheasant, MD

## 2015-05-23 ENCOUNTER — Encounter: Payer: Self-pay | Admitting: Internal Medicine

## 2015-05-23 DIAGNOSIS — N898 Other specified noninflammatory disorders of vagina: Secondary | ICD-10-CM | POA: Insufficient documentation

## 2015-05-23 NOTE — Assessment & Plan Note (Signed)
Blood pressure under good control.  Continue same medication regimen.  Follow pressures.  Follow metabolic panel.   

## 2015-05-23 NOTE — Assessment & Plan Note (Signed)
Followed by Dr Florene Glen.  Counts have been stable.

## 2015-05-23 NOTE — Assessment & Plan Note (Signed)
Diet and exercise.   

## 2015-05-23 NOTE — Assessment & Plan Note (Signed)
Resolved.  Not an issue for her now.  

## 2015-05-23 NOTE — Assessment & Plan Note (Signed)
Perivaginal irritation.  Nystatin cream as needed.

## 2015-05-23 NOTE — Assessment & Plan Note (Signed)
Sugars as outlined.  On metformin.  Continue diet and exercise.  Up to date with eye exams.  Follow met b and a1c.  

## 2015-05-23 NOTE — Assessment & Plan Note (Signed)
Worked up by Dr Gerkin.  Follow tsh.  

## 2015-05-23 NOTE — Assessment & Plan Note (Signed)
Low cholesterol diet and exercise.  Follow lipid panel.   

## 2015-05-23 NOTE — Assessment & Plan Note (Signed)
Bone density 09/23/14 - osteopenia.  Continue weight bearing exercise.  Continue calcium and vitamin D.

## 2015-05-29 ENCOUNTER — Other Ambulatory Visit: Payer: Self-pay | Admitting: Internal Medicine

## 2015-05-29 ENCOUNTER — Other Ambulatory Visit
Admission: RE | Admit: 2015-05-29 | Discharge: 2015-05-29 | Disposition: A | Discharge status: Home / Self Care | Payer: Medicare Other | Source: Ambulatory Visit | Attending: Internal Medicine | Admitting: Internal Medicine

## 2015-05-29 DIAGNOSIS — E780 Pure hypercholesterolemia: Secondary | ICD-10-CM | POA: Diagnosis present

## 2015-05-29 DIAGNOSIS — D469 Myelodysplastic syndrome, unspecified: Secondary | ICD-10-CM | POA: Insufficient documentation

## 2015-05-29 DIAGNOSIS — E119 Type 2 diabetes mellitus without complications: Secondary | ICD-10-CM | POA: Insufficient documentation

## 2015-05-29 LAB — BASIC METABOLIC PANEL
ANION GAP: 8 (ref 5–15)
BUN: 16 mg/dL (ref 6–20)
CHLORIDE: 103 mmol/L (ref 101–111)
CO2: 25 mmol/L (ref 22–32)
Calcium: 8.9 mg/dL (ref 8.9–10.3)
Creatinine, Ser: 0.78 mg/dL (ref 0.44–1.00)
GFR calc non Af Amer: >60 mL/min (ref >60)
Glucose, Bld: 175 mg/dL — ABNORMAL HIGH (ref 65–99)
POTASSIUM: 4 mmol/L (ref 3.5–5.1)
SODIUM: 136 mmol/L (ref 135–145)

## 2015-05-29 LAB — HEPATIC FUNCTION PANEL
ALBUMIN: 3.9 g/dL (ref 3.5–5.0)
ALK PHOS: 77 U/L (ref 38–126)
ALT: 24 U/L (ref 14–54)
AST: 18 U/L (ref 15–41)
Bilirubin, Direct: 0.1 mg/dL (ref 0.1–0.5)
Indirect Bilirubin: 0.5 mg/dL (ref 0.3–0.9)
TOTAL PROTEIN: 6.9 g/dL (ref 6.5–8.1)
Total Bilirubin: 0.6 mg/dL (ref 0.3–1.2)

## 2015-05-29 LAB — LIPID PANEL
Cholesterol: 177 mg/dL (ref 0–200)
HDL: 48 mg/dL (ref >40)
LDL Cholesterol: 77 mg/dL (ref 0–99)
Total CHOL/HDL Ratio: 3.7 RATIO
Triglycerides: 258 mg/dL — ABNORMAL HIGH (ref <150)
VLDL: 52 mg/dL — ABNORMAL HIGH (ref 0–40)

## 2015-05-29 LAB — HEMOGLOBIN A1C: HEMOGLOBIN A1C: 6.3 % — AB (ref 4.0–6.0)

## 2015-05-30 ENCOUNTER — Encounter: Payer: Self-pay | Admitting: Internal Medicine

## 2015-06-13 ENCOUNTER — Other Ambulatory Visit: Payer: Self-pay | Admitting: Internal Medicine

## 2015-06-30 ENCOUNTER — Other Ambulatory Visit: Payer: Self-pay | Admitting: Internal Medicine

## 2015-07-13 ENCOUNTER — Other Ambulatory Visit: Payer: Self-pay | Admitting: Internal Medicine

## 2015-08-04 ENCOUNTER — Ambulatory Visit (INDEPENDENT_AMBULATORY_CARE_PROVIDER_SITE_OTHER): Payer: Medicare Other | Admitting: Internal Medicine

## 2015-08-04 ENCOUNTER — Encounter: Payer: Self-pay | Admitting: Internal Medicine

## 2015-08-04 VITALS — BP 120/70 | HR 76 | Temp 98.0°F | Resp 18 | Ht 61.25 in | Wt 179.2 lb

## 2015-08-04 DIAGNOSIS — E119 Type 2 diabetes mellitus without complications: Secondary | ICD-10-CM

## 2015-08-04 DIAGNOSIS — J029 Acute pharyngitis, unspecified: Secondary | ICD-10-CM | POA: Diagnosis not present

## 2015-08-04 DIAGNOSIS — D469 Myelodysplastic syndrome, unspecified: Secondary | ICD-10-CM

## 2015-08-04 DIAGNOSIS — J069 Acute upper respiratory infection, unspecified: Secondary | ICD-10-CM

## 2015-08-04 DIAGNOSIS — M791 Myalgia, unspecified site: Secondary | ICD-10-CM

## 2015-08-04 DIAGNOSIS — I10 Essential (primary) hypertension: Secondary | ICD-10-CM | POA: Diagnosis not present

## 2015-08-04 MED ORDER — AMOXICILLIN 875 MG PO TABS: 875.0000 mg | ORAL_TABLET | Freq: Two times a day (BID) | ORAL | Status: DC

## 2015-08-04 NOTE — Progress Notes (Signed)
Patient ID: Kara Griffin, female   DOB: [birth year], 79 y.o.   MRN: [id number]   Subjective:    Patient ID: Kara Griffin, female    DOB: March 23, 1936, 79 y.o.   MRN: [id number]  HPI  Patient with past history of hypertension, diabetes, hypercholesterolemia and MDS who comes in today as a work in with concerns regarding sore throat and eye pain.  States starting 06/30/15 - felt sick.  No specific symptoms.  Now describes sore throat.  Cough productive of mucus.  Increased sinus congestion.  Increased drainage.  Ears hurt.  Left eye matted.  Muscle aches and joint aches.  No rash.  Colored mucus production.  Taking tylenol.  No fever.  No abdominal pain.  No bowel change.  Weight loss.     Past Medical History  Diagnosis Date  . Hypertension   . Diabetes mellitus ([place])   . Hypercholesterolemia   . Environmental allergies   . Myelodysplastic syndrome ([place])     Sees Dr Jerrye Noble  . Anemia    Past Surgical History  Procedure Laterality Date  . Tonsilectomy, adenoidectomy, bilateral myringotomy and tubes  1963  . Cesarean section  1973  . Dilation and curettage of uterus    . Total abdominal hysterectomy w/ bilateral salpingoophorectomy  1985    secondary to abnormal cells and abnormal uterine bleeding   Family History  Problem Relation Age of Onset  . Heart disease Father     Deceased (MI) - 76  . Diabetes Mother     Deceased  . Asthma    . Diabetes Brother   . Heart disease Maternal Grandmother     myocardial infarction - 19  . Asthma Maternal Grandmother   . Heart disease Paternal Grandmother     myocardial infarction-52  . Thyroid cancer Mother   . Osteoarthritis Mother   . Heart disease Brother    Social History   Social History  . Marital Status: Divorced    Spouse Name: N/A  . Number of Children: 3  . Years of Education: N/A   Occupational History  . retired Games developer    Social History Main Topics  . Smoking status: Never Smoker   .  Smokeless tobacco: Never Used  . Alcohol Use: No  . Drug Use: No  . Sexual Activity: Not Asked   Other Topics Concern  . None   Social History Narrative    Outpatient Encounter Prescriptions as of 08/04/2015  Medication Sig  . aspirin 81 MG tablet Take 81 mg by mouth daily.  . Calcium Carbonate-Vitamin D (CALCIUM 600+D) 600-400 MG-UNIT per tablet Take 1 tablet by mouth daily.   . fish oil-omega-3 fatty acids 1000 MG capsule Take 1 g by mouth daily.   . fluticasone (FLONASE) 50 MCG/ACT nasal spray Place 2 sprays into both nostrils daily.  Marland Kitchen glucose blood test strip Use to check sugar 1-2 times a day Dx. E11.9 (One Touch Ultra Mini)  . lenalidomide (REVLIMID) 10 MG capsule Take 10 mg by mouth daily.  Marland Kitchen losartan (COZAAR) 50 MG tablet TAKE 1 TABLET BY MOUTH EVERY DAY  . metFORMIN (GLUCOPHAGE) 1000 MG tablet TAKE 1 TABLET BY MOUTH TWICE DAILY  . Multiple Vitamin (MULTIVITAMIN) tablet Take 1 tablet by mouth daily.  Marland Kitchen nystatin cream (MYCOSTATIN) Apply 1 application topically 2 (two) times daily.  . ONE TOUCH LANCETS MISC Use to check sugars 1-2 times a day Dx. E11.9 (One Touch Ultra Mini)  . amoxicillin (AMOXIL) 875 MG  tablet Take 1 tablet (875 mg total) by mouth 2 (two) times daily.   No facility-administered encounter medications on file as of 08/04/2015.    Review of Systems  Constitutional: Positive for appetite change. Negative for fever.  HENT: Positive for congestion, postnasal drip and sore throat.   Eyes:       Left eye gets matted.  Crusting.    Respiratory: Positive for cough. Negative for shortness of breath and wheezing.   Cardiovascular: Negative for chest pain, palpitations and leg swelling.  Gastrointestinal: Negative for vomiting, abdominal pain and diarrhea.  Genitourinary: Negative for dysuria and difficulty urinating.  Musculoskeletal: Negative for back pain.       Muscle aches and joint aches.    Skin: Negative for color change and rash.  Neurological: Negative  for dizziness, light-headedness and headaches.  Psychiatric/Behavioral: Negative for dysphoric mood and agitation.       Objective:    Physical Exam  Constitutional: She appears well-developed and well-nourished. She appears distressed.  HENT:  Mouth/Throat: Oropharynx is clear and moist.  TMs without erythema.  Nares - with slightly erythematous turbinates.  Minimal tenderness to palpation over the sinuses.    Eyes: Conjunctivae are normal. Right eye exhibits no discharge. Left eye exhibits no discharge.  Neck: Neck supple. No thyromegaly present.  Cardiovascular: Normal rate and regular rhythm.   Pulmonary/Chest: Breath sounds normal. No respiratory distress. She has no wheezes.  Abdominal: Soft. Bowel sounds are normal. There is no tenderness.  Musculoskeletal: She exhibits no edema or tenderness.  Lymphadenopathy:    She has no cervical adenopathy.  Skin: No rash noted. No erythema.  Psychiatric: She has a normal mood and affect. Her behavior is normal.    BP 120/70 mmHg  Pulse 76  Temp(Src) 98 F (36.7 C) (Oral)  Resp 18  Ht 5' 1.25" (1.556 m)  Wt 179 lb 4 oz (81.307 kg)  BMI 33.58 kg/m2  SpO2 97%  LMP 08/13/1984 Wt Readings from Last 3 Encounters:  08/04/15 179 lb 4 oz (81.307 kg)  05/22/15 179 lb 4 oz (81.307 kg)  01/16/15 181 lb 4 oz (82.214 kg)     Lab Results  Component Value Date   WBC 4.2 06/10/2014   HGB 13.5 06/10/2014   HCT 40 06/10/2014   PLT 133* 06/10/2014   GLUCOSE 175* 05/29/2015   CHOL 177 05/29/2015   TRIG 258* 05/29/2015   HDL 48 05/29/2015   LDLDIRECT 85.3 08/23/2013   LDLCALC 77 05/29/2015   ALT 24 05/29/2015   AST 18 05/29/2015   NA 136 05/29/2015   K 4.0 05/29/2015   CL 103 05/29/2015   CREATININE 0.78 05/29/2015   BUN 16 05/29/2015   CO2 25 05/29/2015   TSH 1.29 09/08/2014   HGBA1C 6.3* 05/29/2015   MICROALBUR 1.0 01/16/2015       Assessment & Plan:   Problem List Items Addressed This Visit    Diabetes mellitus ([place])     She brought in no recorded sugar readings.  On metformin.  Continue diet and exercise.  Follow met b and a1c.        Hypertension    Blood pressure under good control.  Continue same medication regimen.  Follow pressures.  Follow metabolic panel.        Myelodysplastic syndrome ([place])    Followed by Dr Florene Glen.  Labs just checked today.        URI (upper respiratory infection)    Symptoms and exam as outlined.  Persistent.  With history of MDS.  Labs reviewed by hematology today.  Treat with amoxicillin as directed.  Saline nasal spray and nasacort nasal spray as directed.  Robitussin and mucinex as directed.  Follow.  Stay hydrated.  Check throat culture.         Other Visit Diagnoses    Muscle ache    -  Primary    Relevant Orders    CK (Creatine Kinase) (Completed)    Throat culture Randell Loop) (Completed)    Acute pharyngitis, unspecified etiology        Relevant Orders    Throat culture Randell Loop) (Completed)        Einar Pheasant, MD

## 2015-08-04 NOTE — Progress Notes (Signed)
Pre-visit discussion using our clinic review tool. No additional management support is needed unless otherwise documented below in the visit note.  

## 2015-08-05 LAB — CK: CK TOTAL: 28 U/L (ref 7–177)

## 2015-08-06 LAB — CULTURE, GROUP A STREP: ORGANISM ID, BACTERIA: NORMAL

## 2015-08-07 ENCOUNTER — Encounter: Payer: Self-pay | Admitting: Internal Medicine

## 2015-08-09 ENCOUNTER — Encounter: Payer: Self-pay | Admitting: Internal Medicine

## 2015-08-09 DIAGNOSIS — J069 Acute upper respiratory infection, unspecified: Secondary | ICD-10-CM | POA: Insufficient documentation

## 2015-08-09 NOTE — Assessment & Plan Note (Signed)
She brought in no recorded sugar readings.  On metformin.  Continue diet and exercise.  Follow met b and a1c.

## 2015-08-09 NOTE — Assessment & Plan Note (Signed)
Symptoms and exam as outlined.  Persistent.  With history of MDS.  Labs reviewed by hematology today.  Treat with amoxicillin as directed.  Saline nasal spray and nasacort nasal spray as directed.  Robitussin and mucinex as directed.  Follow.  Stay hydrated.  Check throat culture.

## 2015-08-09 NOTE — Assessment & Plan Note (Signed)
Blood pressure under good control.  Continue same medication regimen.  Follow pressures.  Follow metabolic panel.   

## 2015-08-09 NOTE — Assessment & Plan Note (Signed)
Followed by Dr Florene Glen.  Labs just checked today.

## 2015-08-11 ENCOUNTER — Encounter: Payer: Self-pay | Admitting: Internal Medicine

## 2015-08-18 ENCOUNTER — Encounter: Payer: Medicare Other | Admitting: Internal Medicine

## 2015-09-16 ENCOUNTER — Encounter: Payer: Self-pay | Admitting: Internal Medicine

## 2015-09-16 ENCOUNTER — Ambulatory Visit (INDEPENDENT_AMBULATORY_CARE_PROVIDER_SITE_OTHER): Payer: Medicare Other | Admitting: Internal Medicine

## 2015-09-16 VITALS — BP 122/70 | HR 73 | Temp 97.8°F | Resp 18 | Ht 61.25 in | Wt 178.5 lb

## 2015-09-16 DIAGNOSIS — Z1239 Encounter for other screening for malignant neoplasm of breast: Secondary | ICD-10-CM | POA: Diagnosis not present

## 2015-09-16 DIAGNOSIS — I10 Essential (primary) hypertension: Secondary | ICD-10-CM

## 2015-09-16 DIAGNOSIS — Z0000 Encounter for general adult medical examination without abnormal findings: Secondary | ICD-10-CM

## 2015-09-16 DIAGNOSIS — M8580 Other specified disorders of bone density and structure, unspecified site: Secondary | ICD-10-CM

## 2015-09-16 DIAGNOSIS — E041 Nontoxic single thyroid nodule: Secondary | ICD-10-CM | POA: Diagnosis not present

## 2015-09-16 DIAGNOSIS — E7800 Pure hypercholesterolemia, unspecified: Secondary | ICD-10-CM

## 2015-09-16 DIAGNOSIS — D469 Myelodysplastic syndrome, unspecified: Secondary | ICD-10-CM

## 2015-09-16 DIAGNOSIS — E669 Obesity, unspecified: Secondary | ICD-10-CM

## 2015-09-16 DIAGNOSIS — E119 Type 2 diabetes mellitus without complications: Secondary | ICD-10-CM

## 2015-09-16 NOTE — Progress Notes (Signed)
Patient ID: Kara Griffin, female   DOB: 1936/06/23, 80 y.o.   MRN: [id number]   Subjective:    Patient ID: Kara Griffin, female    DOB: 05-10-1936, 80 y.o.   MRN: [id number]  HPI  Patient with past history of hypercholesterolemia, diabetes, hypertension and myelodysplastic syndrome.  She comes in today to follow up on these issues as well as for a complete physical exam.  She is trying to watch her diet.  Sugars have been higher.  On most recent checks, improved.  Discussed diet and exercise.  No chest pain or tightness.  No sob.  No acid reflux.  No abdominal pain or cramping.  Bowels stable.     Past Medical History  Diagnosis Date  . Hypertension   . Diabetes mellitus ([place])   . Hypercholesterolemia   . Environmental allergies   . Myelodysplastic syndrome ([place])     Sees Dr Jerrye Noble  . Anemia    Past Surgical History  Procedure Laterality Date  . Tonsilectomy, adenoidectomy, bilateral myringotomy and tubes  1963  . Cesarean section  1973  . Dilation and curettage of uterus    . Total abdominal hysterectomy w/ bilateral salpingoophorectomy  1985    secondary to abnormal cells and abnormal uterine bleeding   Family History  Problem Relation Age of Onset  . Heart disease Father     Deceased (MI) - 40  . Diabetes Mother     Deceased  . Asthma    . Diabetes Brother   . Heart disease Maternal Grandmother     myocardial infarction - 36  . Asthma Maternal Grandmother   . Heart disease Paternal Grandmother     myocardial infarction-52  . Thyroid cancer Mother   . Osteoarthritis Mother   . Heart disease Brother    Social History   Social History  . Marital Status: Divorced    Spouse Name: N/A  . Number of Children: 3  . Years of Education: N/A   Occupational History  . retired Games developer    Social History Main Topics  . Smoking status: Never Smoker   . Smokeless tobacco: Never Used  . Alcohol Use: No  . Drug Use: No  . Sexual Activity: Not  Asked   Other Topics Concern  . None   Social History Narrative    Outpatient Encounter Prescriptions as of 09/16/2015  Medication Sig  . aspirin 81 MG tablet Take 81 mg by mouth daily.  . Calcium Carbonate-Vitamin D (CALCIUM 600+D) 600-400 MG-UNIT per tablet Take 1 tablet by mouth daily.   . fish oil-omega-3 fatty acids 1000 MG capsule Take 1 g by mouth daily.   . fluticasone (FLONASE) 50 MCG/ACT nasal spray Place 2 sprays into both nostrils daily.  Marland Kitchen glucose blood test strip Use to check sugar 1-2 times a day Dx. E11.9 (One Touch Ultra Mini)  . lenalidomide (REVLIMID) 10 MG capsule Take 10 mg by mouth daily.  Marland Kitchen losartan (COZAAR) 50 MG tablet TAKE 1 TABLET BY MOUTH EVERY DAY  . metFORMIN (GLUCOPHAGE) 1000 MG tablet TAKE 1 TABLET BY MOUTH TWICE DAILY  . Multiple Vitamin (MULTIVITAMIN) tablet Take 1 tablet by mouth daily.  Marland Kitchen nystatin cream (MYCOSTATIN) Apply 1 application topically 2 (two) times daily. (Patient taking differently: Apply 1 application topically 2 (two) times daily as needed. )  . ONE TOUCH LANCETS MISC Use to check sugars 1-2 times a day Dx. E11.9 (One Touch Ultra Mini)  . [DISCONTINUED] amoxicillin (AMOXIL) 875 MG  tablet Take 1 tablet (875 mg total) by mouth 2 (two) times daily.   No facility-administered encounter medications on file as of 09/16/2015.    Review of Systems  Constitutional: Negative for appetite change and unexpected weight change.  HENT: Negative for congestion and sinus pressure.   Eyes: Negative for pain and visual disturbance.  Respiratory: Negative for cough, chest tightness and shortness of breath.   Cardiovascular: Negative for chest pain, palpitations and leg swelling.  Gastrointestinal: Negative for nausea, vomiting, abdominal pain and diarrhea.  Genitourinary: Negative for dysuria and difficulty urinating.  Musculoskeletal: Negative for back pain and joint swelling.  Skin: Negative for color change and rash.  Neurological: Negative for  dizziness, light-headedness and headaches.  Hematological: Negative for adenopathy. Does not bruise/bleed easily.  Psychiatric/Behavioral: Negative for dysphoric mood and agitation.       Objective:    Physical Exam  Constitutional: She is oriented to person, place, and time. She appears well-developed and well-nourished. No distress.  HENT:  Nose: Nose normal.  Mouth/Throat: Oropharynx is clear and moist.  Eyes: Right eye exhibits no discharge. Left eye exhibits no discharge. No scleral icterus.  Neck: Neck supple. No thyromegaly present.  Cardiovascular: Normal rate and regular rhythm.   Pulmonary/Chest: Breath sounds normal. No accessory muscle usage. No tachypnea. No respiratory distress. She has no decreased breath sounds. She has no wheezes. She has no rhonchi. Right breast exhibits no inverted nipple, no mass, no nipple discharge and no tenderness (no axillary adenopathy). Left breast exhibits no inverted nipple, no mass, no nipple discharge and no tenderness (no axilarry adenopathy).  Abdominal: Soft. Bowel sounds are normal. There is no tenderness.  Musculoskeletal: She exhibits no edema or tenderness.  Lymphadenopathy:    She has no cervical adenopathy.  Neurological: She is alert and oriented to person, place, and time.  Skin: Skin is warm. No rash noted. No erythema.  Psychiatric: She has a normal mood and affect. Her behavior is normal.    BP 122/70 mmHg  Pulse 73  Temp(Src) 97.8 F (36.6 C) (Oral)  Resp 18  Ht 5' 1.25" (1.556 m)  Wt 178 lb 8 oz (80.967 kg)  BMI 33.44 kg/m2  SpO2 97%  LMP 08/13/1984 Wt Readings from Last 3 Encounters:  09/16/15 178 lb 8 oz (80.967 kg)  08/04/15 179 lb 4 oz (81.307 kg)  05/22/15 179 lb 4 oz (81.307 kg)     Lab Results  Component Value Date   WBC 4.2 06/10/2014   HGB 13.5 06/10/2014   HCT 40 06/10/2014   PLT 133* 06/10/2014   GLUCOSE 175* 05/29/2015   CHOL 177 05/29/2015   TRIG 258* 05/29/2015   HDL 48 05/29/2015    LDLDIRECT 85.3 08/23/2013   LDLCALC 77 05/29/2015   ALT 24 05/29/2015   AST 18 05/29/2015   NA 136 05/29/2015   K 4.0 05/29/2015   CL 103 05/29/2015   CREATININE 0.78 05/29/2015   BUN 16 05/29/2015   CO2 25 05/29/2015   TSH 1.29 09/08/2014   HGBA1C 6.3* 05/29/2015   MICROALBUR 1.0 01/16/2015       Assessment & Plan:   Problem List Items Addressed This Visit    Diabetes mellitus ([place])    Sugars attached.  Have been higher.  Most recent checks improved.  Continue diet and exercise as discussed.  Same medication regimen.  Follow met b and a1c.       Relevant Orders   Basic metabolic panel   Hemoglobin A1c  Health care maintenance    Physical today 09/16/15.  Desires not to have another colonoscopy.  Agreed to cologuard.  Mammogram 09/23/14 - Birads I.  Schedule f/u mammogram.        Hypercholesterolemia    Low cholesterol diet and exercise.  Follow lipid panel.        Relevant Orders   Lipid panel   Hepatic function panel   Hypertension    Blood pressure under good control.  Continue same medication regimen.  Follow pressures.  Follow metabolic panel.        Relevant Orders   TSH   Myelodysplastic syndrome ([place])    Followed by hematology - Dr Florene Glen.  Stable.       Obesity (BMI 30-39.9)    Diet and exercise.        Osteopenia    Bone density 09/23/14 - osteopenia.  Continue weight bearing exercise.        Relevant Orders   VITAMIN D 25 Hydroxy (Vit-D Deficiency, Fractures)   Thyroid nodule    Worked up by Dr Harlow Asa.  Follow tsh.        Other Visit Diagnoses    Screening breast examination    -  Primary    Relevant Orders    MM DIGITAL SCREENING BILATERAL        Einar Pheasant, MD

## 2015-09-16 NOTE — Assessment & Plan Note (Signed)
Physical today 09/16/15.  Desires not to have another colonoscopy.  Agreed to cologuard.  Mammogram 09/23/14 - Birads I.  Schedule f/u mammogram.

## 2015-09-16 NOTE — Progress Notes (Signed)
Pre-visit discussion using our clinic review tool. No additional management support is needed unless otherwise documented below in the visit note.  

## 2015-09-28 ENCOUNTER — Encounter: Payer: Self-pay | Admitting: Internal Medicine

## 2015-09-28 NOTE — Assessment & Plan Note (Signed)
Followed by hematology - Dr Powell.  Stable.   

## 2015-09-28 NOTE — Assessment & Plan Note (Signed)
Bone density 09/23/14 - osteopenia.  Continue weight bearing exercise.

## 2015-09-28 NOTE — Assessment & Plan Note (Signed)
Worked up by Dr Gerkin.  Follow tsh.  

## 2015-09-28 NOTE — Assessment & Plan Note (Signed)
Blood pressure under good control.  Continue same medication regimen.  Follow pressures.  Follow metabolic panel.   

## 2015-09-28 NOTE — Assessment & Plan Note (Signed)
Low cholesterol diet and exercise.  Follow lipid panel.   

## 2015-09-28 NOTE — Assessment & Plan Note (Signed)
Diet and exercise.   

## 2015-09-28 NOTE — Assessment & Plan Note (Signed)
Sugars attached.  Have been higher.  Most recent checks improved.  Continue diet and exercise as discussed.  Same medication regimen.  Follow met b and a1c.

## 2015-09-30 ENCOUNTER — Ambulatory Visit
Admission: RE | Admit: 2015-09-30 | Discharge: 2015-09-30 | Disposition: A | Discharge status: Home / Self Care | Payer: Medicare Other | Source: Ambulatory Visit | Attending: Internal Medicine | Admitting: Internal Medicine

## 2015-09-30 DIAGNOSIS — Z1231 Encounter for screening mammogram for malignant neoplasm of breast: Secondary | ICD-10-CM | POA: Insufficient documentation

## 2015-09-30 DIAGNOSIS — Z1239 Encounter for other screening for malignant neoplasm of breast: Secondary | ICD-10-CM

## 2015-10-05 ENCOUNTER — Other Ambulatory Visit
Admission: RE | Admit: 2015-10-05 | Discharge: 2015-10-05 | Disposition: A | Discharge status: Home / Self Care | Payer: Medicare Other | Source: Ambulatory Visit | Attending: Internal Medicine | Admitting: Internal Medicine

## 2015-10-05 DIAGNOSIS — E7800 Pure hypercholesterolemia, unspecified: Secondary | ICD-10-CM | POA: Insufficient documentation

## 2015-10-05 DIAGNOSIS — I10 Essential (primary) hypertension: Secondary | ICD-10-CM

## 2015-10-05 DIAGNOSIS — E119 Type 2 diabetes mellitus without complications: Secondary | ICD-10-CM | POA: Diagnosis present

## 2015-10-05 DIAGNOSIS — M8580 Other specified disorders of bone density and structure, unspecified site: Secondary | ICD-10-CM

## 2015-10-05 LAB — HEPATIC FUNCTION PANEL
ALK PHOS: 75 U/L (ref 38–126)
ALT: 23 U/L (ref 14–54)
AST: 17 U/L (ref 15–41)
Albumin: 3.9 g/dL (ref 3.5–5.0)
BILIRUBIN TOTAL: 0.6 mg/dL (ref 0.3–1.2)
Bilirubin, Direct: <0.1 mg/dL — ABNORMAL LOW (ref 0.1–0.5)
Total Protein: 7 g/dL (ref 6.5–8.1)

## 2015-10-05 LAB — BASIC METABOLIC PANEL
Anion gap: 9 (ref 5–15)
BUN: 16 mg/dL (ref 6–20)
CO2: 24 mmol/L (ref 22–32)
CREATININE: 0.69 mg/dL (ref 0.44–1.00)
Calcium: 9.1 mg/dL (ref 8.9–10.3)
Chloride: 103 mmol/L (ref 101–111)
GFR calc Af Amer: >60 mL/min (ref >60)
GLUCOSE: 172 mg/dL — AB (ref 65–99)
POTASSIUM: 3.9 mmol/L (ref 3.5–5.1)
SODIUM: 136 mmol/L (ref 135–145)

## 2015-10-05 LAB — HEMOGLOBIN A1C: Hgb A1c MFr Bld: 6.2 % — ABNORMAL HIGH (ref 4.0–6.0)

## 2015-10-05 LAB — LIPID PANEL
CHOL/HDL RATIO: 3.2 ratio
CHOLESTEROL: 160 mg/dL (ref 0–200)
HDL: 50 mg/dL (ref >40)
LDL Cholesterol: 67 mg/dL (ref 0–99)
TRIGLYCERIDES: 215 mg/dL — AB (ref <150)
VLDL: 43 mg/dL — ABNORMAL HIGH (ref 0–40)

## 2015-10-05 LAB — TSH: TSH: 2.532 u[IU]/mL (ref 0.350–4.500)

## 2015-10-06 ENCOUNTER — Encounter: Payer: Self-pay | Admitting: Internal Medicine

## 2015-10-06 LAB — VITAMIN D 25 HYDROXY (VIT D DEFICIENCY, FRACTURES): Vit D, 25-Hydroxy: 23.5 ng/mL — ABNORMAL LOW (ref 30.0–100.0)

## 2015-10-07 ENCOUNTER — Encounter: Payer: Self-pay | Admitting: *Deleted

## 2015-10-07 LAB — COLOGUARD: Cologuard: NEGATIVE

## 2015-10-07 LAB — HM COLONOSCOPY

## 2015-10-08 NOTE — Telephone Encounter (Signed)
Unread mychart message mailed to patient 

## 2015-10-15 ENCOUNTER — Other Ambulatory Visit: Payer: Self-pay | Admitting: Internal Medicine

## 2015-10-26 ENCOUNTER — Encounter: Payer: Self-pay | Admitting: *Deleted

## 2015-10-30 ENCOUNTER — Encounter: Payer: Self-pay | Admitting: Internal Medicine

## 2015-10-30 ENCOUNTER — Other Ambulatory Visit: Payer: Self-pay | Admitting: Internal Medicine

## 2015-10-30 NOTE — Telephone Encounter (Signed)
Medication filled to pharmacy as requested.   

## 2015-12-02 ENCOUNTER — Other Ambulatory Visit: Payer: Self-pay | Admitting: Internal Medicine

## 2015-12-07 ENCOUNTER — Other Ambulatory Visit: Payer: Self-pay | Admitting: Internal Medicine

## 2015-12-16 ENCOUNTER — Telehealth: Payer: Self-pay | Admitting: Internal Medicine

## 2015-12-16 ENCOUNTER — Encounter: Payer: Self-pay | Admitting: Internal Medicine

## 2015-12-16 DIAGNOSIS — Z0000 Encounter for general adult medical examination without abnormal findings: Secondary | ICD-10-CM

## 2015-12-16 NOTE — Telephone Encounter (Signed)
Pt notified cologuard negative.

## 2016-01-15 ENCOUNTER — Encounter: Payer: Self-pay | Admitting: Internal Medicine

## 2016-01-15 ENCOUNTER — Ambulatory Visit (INDEPENDENT_AMBULATORY_CARE_PROVIDER_SITE_OTHER): Payer: Medicare Other | Admitting: Internal Medicine

## 2016-01-15 VITALS — BP 118/62 | HR 89 | Temp 97.4°F | Resp 14 | Ht 61.0 in | Wt 180.4 lb

## 2016-01-15 DIAGNOSIS — M25552 Pain in left hip: Secondary | ICD-10-CM

## 2016-01-15 DIAGNOSIS — E7800 Pure hypercholesterolemia, unspecified: Secondary | ICD-10-CM | POA: Diagnosis not present

## 2016-01-15 DIAGNOSIS — E119 Type 2 diabetes mellitus without complications: Secondary | ICD-10-CM | POA: Diagnosis not present

## 2016-01-15 DIAGNOSIS — E669 Obesity, unspecified: Secondary | ICD-10-CM

## 2016-01-15 DIAGNOSIS — I10 Essential (primary) hypertension: Secondary | ICD-10-CM

## 2016-01-15 DIAGNOSIS — E041 Nontoxic single thyroid nodule: Secondary | ICD-10-CM

## 2016-01-15 DIAGNOSIS — D469 Myelodysplastic syndrome, unspecified: Secondary | ICD-10-CM

## 2016-01-15 LAB — BASIC METABOLIC PANEL
BUN: 18 mg/dL (ref 6–23)
CHLORIDE: 106 meq/L (ref 96–112)
CO2: 24 meq/L (ref 19–32)
CREATININE: 0.64 mg/dL (ref 0.40–1.20)
Calcium: 9.6 mg/dL (ref 8.4–10.5)
GFR: 95.31 mL/min (ref >60.00)
Glucose, Bld: 172 mg/dL — ABNORMAL HIGH (ref 70–99)
POTASSIUM: 3.9 meq/L (ref 3.5–5.1)
SODIUM: 139 meq/L (ref 135–145)

## 2016-01-15 LAB — HEMOGLOBIN A1C: HEMOGLOBIN A1C: 6.8 % — AB (ref 4.6–6.5)

## 2016-01-15 NOTE — Progress Notes (Signed)
Pre visit review using our clinic review tool, if applicable. No additional management support is needed unless otherwise documented below in the visit note. 

## 2016-01-15 NOTE — Progress Notes (Signed)
Patient ID: Kara Griffin, female   DOB: 09-05-1936, 80 y.o.   MRN: [id number]   Subjective:    Patient ID: Kara Griffin, female    DOB: 04-03-1936, 80 y.o.   MRN: [id number]  HPI  Patient here for a scheduled follow up.  Reports left hip pain (intermittent) for at least 6 months.  Reports pain in her left lower back and left hip.  No known injury.  No chest pain.  Breathing stable.  No acid reflux.  No abdominal pain or cramping.  Bowels stable.     Past Medical History  Diagnosis Date  . Hypertension   . Diabetes mellitus ([place])   . Hypercholesterolemia   . Environmental allergies   . Anemia   . Myelodysplastic syndrome ([place])     Sees Dr Jerrye Noble   Past Surgical History  Procedure Laterality Date  . Tonsilectomy, adenoidectomy, bilateral myringotomy and tubes  1963  . Cesarean section  1973  . Dilation and curettage of uterus    . Total abdominal hysterectomy w/ bilateral salpingoophorectomy  1985    secondary to abnormal cells and abnormal uterine bleeding  . Abdominal hysterectomy     Family History  Problem Relation Age of Onset  . Heart disease Father     Deceased (MI) - 74  . Diabetes Mother     Deceased  . Asthma    . Diabetes Brother   . Heart disease Maternal Grandmother     myocardial infarction - 50  . Asthma Maternal Grandmother   . Heart disease Paternal Grandmother     myocardial infarction-52  . Thyroid cancer Mother   . Osteoarthritis Mother   . Heart disease Brother    Social History   Social History  . Marital Status: Divorced    Spouse Name: N/A  . Number of Children: 3  . Years of Education: N/A   Occupational History  . retired Games developer    Social History Main Topics  . Smoking status: Never Smoker   . Smokeless tobacco: Never Used  . Alcohol Use: No  . Drug Use: No  . Sexual Activity: Not Asked   Other Topics Concern  . None   Social History Narrative    Outpatient Encounter Prescriptions as of 01/15/2016   Medication Sig  . aspirin 81 MG tablet Take 81 mg by mouth daily.  . fish oil-omega-3 fatty acids 1000 MG capsule Take 1 g by mouth daily.   . fluticasone (FLONASE) 50 MCG/ACT nasal spray Place 2 sprays into both nostrils daily.  Marland Kitchen lenalidomide (REVLIMID) 10 MG capsule Take 10 mg by mouth daily.  Marland Kitchen losartan (COZAAR) 50 MG tablet TAKE 1 TABLET BY MOUTH EVERY DAY  . metFORMIN (GLUCOPHAGE) 1000 MG tablet TAKE 1 TABLET BY MOUTH TWICE DAILY  . Multiple Vitamin (MULTIVITAMIN) tablet Take 1 tablet by mouth daily.  Marland Kitchen nystatin cream (MYCOSTATIN) Apply 1 application topically 2 (two) times daily. (Patient taking differently: Apply 1 application topically 2 (two) times daily as needed. )  . ONE TOUCH LANCETS MISC Use to check sugars 1-2 times a day Dx. E11.9 (One Touch Ultra Mini)  . ONE TOUCH ULTRA TEST test strip USE AS DIRECTED TO TEST BLOOD SUGAR TWICE DAILY  . Calcium Carbonate-Vitamin D (CALCIUM 600+D) 600-400 MG-UNIT per tablet Take 1 tablet by mouth daily. Reported on 01/15/2016   No facility-administered encounter medications on file as of 01/15/2016.    Review of Systems  Constitutional: Negative for appetite change and unexpected  weight change.  HENT: Negative for congestion and sinus pressure.   Respiratory: Negative for cough, chest tightness and shortness of breath.   Cardiovascular: Negative for chest pain, palpitations and leg swelling.  Gastrointestinal: Negative for nausea, vomiting, abdominal pain and diarrhea.  Genitourinary: Negative for dysuria and difficulty urinating.  Musculoskeletal: Positive for back pain (left lower back pain and left hip pain. ). Negative for joint swelling.  Skin: Negative for color change and rash.  Neurological: Negative for dizziness, light-headedness and headaches.  Psychiatric/Behavioral: Negative for dysphoric mood and agitation.       Objective:     Blood pressure rechecked by me:  128/72  Physical Exam  Constitutional: She appears  well-developed and well-nourished. No distress.  HENT:  Nose: Nose normal.  Mouth/Throat: Oropharynx is clear and moist.  Neck: Neck supple. No thyromegaly present.  Cardiovascular: Normal rate and regular rhythm.   Pulmonary/Chest: Breath sounds normal. No respiratory distress. She has no wheezes.  Abdominal: Soft. Bowel sounds are normal. There is no tenderness.  Musculoskeletal: She exhibits no edema or tenderness.  No significant pain with straight leg raise.  Good rom.  Lymphadenopathy:    She has no cervical adenopathy.  Skin: No rash noted. No erythema.  Psychiatric: She has a normal mood and affect. Her behavior is normal.    BP 118/62 mmHg  Pulse 89  Temp(Src) 97.4 F (36.3 C) (Oral)  Resp 14  Ht _0  (1.549 m)  Wt 180 lb 6.4 oz (81.829 kg)  BMI 34.10 kg/m2  SpO2 96%  LMP 08/13/1984 Wt Readings from Last 3 Encounters:  01/15/16 180 lb 6.4 oz (81.829 kg)  09/16/15 178 lb 8 oz (80.967 kg)  08/04/15 179 lb 4 oz (81.307 kg)     Lab Results  Component Value Date   WBC 4.2 06/10/2014   HGB 13.5 06/10/2014   HCT 40 06/10/2014   PLT 133* 06/10/2014   GLUCOSE 172* 01/15/2016   CHOL 160 10/05/2015   TRIG 215* 10/05/2015   HDL 50 10/05/2015   LDLDIRECT 85.3 08/23/2013   LDLCALC 67 10/05/2015   ALT 23 10/05/2015   AST 17 10/05/2015   NA 139 01/15/2016   K 3.9 01/15/2016   CL 106 01/15/2016   CREATININE 0.64 01/15/2016   BUN 18 01/15/2016   CO2 24 01/15/2016   TSH 2.532 10/05/2015   HGBA1C 6.8* 01/15/2016   MICROALBUR 1.0 01/16/2015       Assessment & Plan:   Problem List Items Addressed This Visit    Diabetes mellitus ([place])    Low carb diet and exercise.  On metformin.  Sugars reviewed and attached.  Check met b and a1c.       Relevant Orders   Hemoglobin A1c (Completed)   Hypercholesterolemia    Low cholesterol diet and exercise.  Follow lipid panel.        Hypertension - Primary    Blood pressure under good control.  Continue same medication  regimen.  Follow pressures.  Follow metabolic panel.        Relevant Orders   Basic metabolic panel (Completed)   Left hip pain    Persistent pain.  Check xray.  Tylenol.  Consider therapy pending results.        Relevant Orders   DG Lumbar Spine 2-3 Views   DG HIP UNILAT WITH PELVIS 2-3 VIEWS LEFT   Myelodysplastic syndrome ([place])    Followed by hematology.  Has been stable.  Obesity (BMI 30-39.9)    Diet and exercise.        Thyroid nodule    Worked up by Dr Harlow Asa.  Follow tsh.           Einar Pheasant, MD

## 2016-01-17 ENCOUNTER — Encounter: Payer: Self-pay | Admitting: Internal Medicine

## 2016-01-17 NOTE — Assessment & Plan Note (Signed)
Persistent pain.  Check xray.  Tylenol.  Consider therapy pending results.

## 2016-01-17 NOTE — Assessment & Plan Note (Signed)
Blood pressure under good control.  Continue same medication regimen.  Follow pressures.  Follow metabolic panel.   

## 2016-01-17 NOTE — Assessment & Plan Note (Signed)
Low cholesterol diet and exercise.  Follow lipid panel.   

## 2016-01-17 NOTE — Assessment & Plan Note (Signed)
Diet and exercise.   

## 2016-01-17 NOTE — Assessment & Plan Note (Signed)
Followed by hematology.  Has been stable.   

## 2016-01-17 NOTE — Assessment & Plan Note (Signed)
Worked up by Dr Gerkin.  Follow tsh.  

## 2016-01-17 NOTE — Assessment & Plan Note (Signed)
Low carb diet and exercise.  On metformin.  Sugars reviewed and attached.  Check met b and a1c.

## 2016-01-26 ENCOUNTER — Other Ambulatory Visit: Payer: Self-pay | Admitting: Family

## 2016-01-26 ENCOUNTER — Ambulatory Visit
Admission: RE | Admit: 2016-01-26 | Discharge: 2016-01-26 | Disposition: A | Discharge status: Home / Self Care | Payer: Medicare Other | Source: Ambulatory Visit | Attending: Family | Admitting: Family

## 2016-01-26 ENCOUNTER — Ambulatory Visit
Admission: RE | Admit: 2016-01-26 | Discharge: 2016-01-26 | Disposition: A | Discharge status: Home / Self Care | Payer: Medicare Other | Source: Ambulatory Visit | Attending: Internal Medicine | Admitting: Internal Medicine

## 2016-01-26 ENCOUNTER — Ambulatory Visit (INDEPENDENT_AMBULATORY_CARE_PROVIDER_SITE_OTHER): Payer: Medicare Other | Admitting: Family

## 2016-01-26 VITALS — BP 128/64 | HR 80 | Temp 95.2°F | Ht 61.0 in | Wt 177.2 lb

## 2016-01-26 DIAGNOSIS — R059 Cough, unspecified: Secondary | ICD-10-CM

## 2016-01-26 DIAGNOSIS — R05 Cough: Secondary | ICD-10-CM

## 2016-01-26 DIAGNOSIS — M25552 Pain in left hip: Secondary | ICD-10-CM

## 2016-01-26 DIAGNOSIS — J189 Pneumonia, unspecified organism: Secondary | ICD-10-CM

## 2016-01-26 DIAGNOSIS — J449 Chronic obstructive pulmonary disease, unspecified: Secondary | ICD-10-CM | POA: Diagnosis not present

## 2016-01-26 DIAGNOSIS — M545 Low back pain: Secondary | ICD-10-CM | POA: Diagnosis present

## 2016-01-26 MED ORDER — AZITHROMYCIN 250 MG PO TABS: ORAL_TABLET | ORAL | Status: DC

## 2016-01-26 NOTE — Progress Notes (Signed)
Subjective:    Patient ID: Kara Griffin, female    DOB: 10-02-1935, 80 y.o.   MRN: [id number]   Kara Griffin is a 80 y.o. female who presents today for an acute visit.    HPI Comments: No h/o lung disease. H/o seasonal allergies.  URI  This is a new problem. The current episode started 1 to 4 weeks ago. The problem has been unchanged. There has been no fever. Associated symptoms include congestion and coughing. Pertinent negatives include no chest pain, ear pain, headaches, nausea, sinus pain, sore throat, vomiting or wheezing. She has tried antihistamine (flonase) for the symptoms. The treatment provided mild relief.   Past Medical History  Diagnosis Date  . Hypertension   . Diabetes mellitus ([place])   . Hypercholesterolemia   . Environmental allergies   . Anemia   . Myelodysplastic syndrome ([facility])     Sees Dr Jerrye Noble   Allergies: Review of patient's allergies indicates no known allergies. Current Outpatient Prescriptions on File Prior to Visit  Medication Sig Dispense Refill  . aspirin 81 MG tablet Take 81 mg by mouth daily.    . Calcium Carbonate-Vitamin D (CALCIUM 600+D) 600-400 MG-UNIT per tablet Take 1 tablet by mouth daily. Reported on 01/15/2016    . fish oil-omega-3 fatty acids 1000 MG capsule Take 1 g by mouth daily.     . fluticasone (FLONASE) 50 MCG/ACT nasal spray Place 2 sprays into both nostrils daily.    Marland Kitchen lenalidomide (REVLIMID) 10 MG capsule Take 10 mg by mouth daily.    Marland Kitchen losartan (COZAAR) 50 MG tablet TAKE 1 TABLET BY MOUTH EVERY DAY 30 tablet 11  . metFORMIN (GLUCOPHAGE) 1000 MG tablet TAKE 1 TABLET BY MOUTH TWICE DAILY 60 tablet 6  . Multiple Vitamin (MULTIVITAMIN) tablet Take 1 tablet by mouth daily.    Marland Kitchen nystatin cream (MYCOSTATIN) Apply 1 application topically 2 (two) times daily. (Patient taking differently: Apply 1 application topically 2 (two) times daily as needed. ) 45 g 0  . ONE TOUCH LANCETS MISC Use to check sugars 1-2 times a  day Dx. E11.9 (One Touch Ultra Mini) 100 each 3  . ONE TOUCH ULTRA TEST test strip USE AS DIRECTED TO TEST BLOOD SUGAR TWICE DAILY 100 each 11   No current facility-administered medications on file prior to visit.    Social History  Substance Use Topics  . Smoking status: Never Smoker   . Smokeless tobacco: Never Used  . Alcohol Use: No    Review of Systems  Constitutional: Negative for fever and chills.  HENT: Positive for congestion and sinus pressure (right side). Negative for ear pain and sore throat.   Respiratory: Positive for cough. Negative for shortness of breath and wheezing.   Cardiovascular: Negative for chest pain and palpitations.  Gastrointestinal: Negative for nausea and vomiting.  Neurological: Negative for headaches.      Objective:    BP 128/64 mmHg  Pulse 80  Temp(Src) 95.2 F (35.1 C) (Oral)  Ht 5\' 1"  (1.549 m)  Wt 177 lb 3.2 oz (80.377 kg)  BMI 33.50 kg/m2  SpO2 95%  LMP 08/13/1984   Physical Exam  Constitutional: She appears well-developed and well-nourished.  HENT:  Head: Normocephalic and atraumatic.  Right Ear: Hearing, tympanic membrane, external ear and ear canal normal. No drainage, swelling or tenderness. No foreign bodies. Tympanic membrane is not erythematous and not bulging. No middle ear effusion. No decreased hearing is noted.  Left Ear: Hearing, tympanic membrane, external  ear and ear canal normal. No drainage, swelling or tenderness. No foreign bodies. Tympanic membrane is not erythematous and not bulging.  No middle ear effusion. No decreased hearing is noted.  Nose: Rhinorrhea present. Right sinus exhibits no maxillary sinus tenderness and no frontal sinus tenderness. Left sinus exhibits no maxillary sinus tenderness and no frontal sinus tenderness.  Mouth/Throat: Uvula is midline, oropharynx is clear and moist and mucous membranes are normal. No oropharyngeal exudate, posterior oropharyngeal edema, posterior oropharyngeal erythema or  tonsillar abscesses.  Eyes: Conjunctivae are normal.  Cardiovascular: Regular rhythm, normal heart sounds and normal pulses.   Pulmonary/Chest: Effort normal. She has no wheezes. She has no rhonchi. She has rales in the left lower field.  Few crackles heard LLF.   Lymphadenopathy:       Head (right side): No submental, no submandibular, no tonsillar, no preauricular, no posterior auricular and no occipital adenopathy present.       Head (left side): No submental, no submandibular, no tonsillar, no preauricular, no posterior auricular and no occipital adenopathy present.    She has no cervical adenopathy.  Neurological: She is alert.  Skin: Skin is warm and dry.  Psychiatric: She has a normal mood and affect. Her speech is normal and behavior is normal. Thought content normal.  Vitals reviewed.      Assessment & Plan:   1. Cough Few, discrete crackles heard LLF. Afebrile. No acute respiratory distress. Pending CXR to evaluate for PNA.    - DG Chest 2 View    I am having Ms. Nuding maintain her fish oil-omega-3 fatty acids, lenalidomide, Calcium Carbonate-Vitamin D, aspirin, multivitamin, ONE TOUCH LANCETS, fluticasone, nystatin cream, metFORMIN, ONE TOUCH ULTRA TEST, and losartan.   No orders of the defined types were placed in this encounter.     Start medications as prescribed and explained to patient on After Visit Summary ( AVS). Risks, benefits, and alternatives of the medications and treatment plan prescribed today were discussed, and patient expressed understanding.   Education regarding symptom management and diagnosis given to patient.   Follow-up:Plan follow-up as discussed or as needed if any worsening symptoms or change in condition.   Continue to follow with Einar Pheasant, MD for routine health maintenance.   [place] and I agreed with plan.   Mable Paris, FNP

## 2016-01-26 NOTE — Patient Instructions (Signed)
Chest xray as soon as you can; want to evaluate for pneumonia.   You may use over the counter mucinex.   Increase intake of clear fluids. Congestion is best treated by hydration, when mucus is wetter, it is thinner, less sticky, and easier to expel from the body, either through coughing up drainage, or by blowing your nose.   Get plenty of rest.   Use saline nasal drops and blow your nose frequently. Run a humidifier at night and elevate the head of the bed. Vicks Vapor rub will help with congestion and cough. Steam showers and sinus massage for congestion.   Use Acetaminophen or Ibuprofen as needed for fever or pain. Avoid second hand smoke. Even the smallest exposure will worsen symptoms.    You can also try a teaspoon of honey to see if this will help reduce cough. Throat lozenges can sometimes be beneficial as well.    This illness will typically last 7 - 10 days.   Please follow up with our clinic if you develop a fever greater than 101 F, symptoms worsen, or do not resolve in the next week.

## 2016-01-26 NOTE — Progress Notes (Signed)
Pre visit review using our clinic review tool, if applicable. No additional management support is needed unless otherwise documented below in the visit note. 

## 2016-01-27 ENCOUNTER — Telehealth: Payer: Self-pay

## 2016-01-27 DIAGNOSIS — M25552 Pain in left hip: Secondary | ICD-10-CM

## 2016-01-27 NOTE — Telephone Encounter (Signed)
Patient aware of xray results.  Patient is willing to try physical therapy.

## 2016-01-27 NOTE — Telephone Encounter (Signed)
-----   Message from Burnard Hawthorne, [place] sent at 01/26/2016  8:08 PM EDT ----- Please let patient know that recent tests were ABNORMAL with the following advice :  CXR reflects mild PNA. I have sent rx to her pharmacy.    If unable to reach patient after 3 attempts, please let me know.   Thanks as always!  margaret

## 2016-01-27 NOTE — Telephone Encounter (Signed)
-----   Message from Einar Pheasant, MD sent at 01/26/2016 11:45 PM EDT ----- Please call and notify pt that her hip xray is ok.  Back xray reveals no acute abnormality.  She does have some mild disc space narrowing at L4-5 and some mild arthritis.  Given persistent pain, I would like to refer pt to physical therapy for further evaluation and treatment.  Let me know if agreeable and I will place the order for the referral.

## 2016-01-27 NOTE — Telephone Encounter (Signed)
Called patient. No answer. Will try later.  

## 2016-01-28 NOTE — Telephone Encounter (Signed)
Order placed for physical therapy.  

## 2016-02-02 ENCOUNTER — Telehealth: Payer: Self-pay

## 2016-02-02 NOTE — Telephone Encounter (Signed)
I have left a message for the patient to return my call.

## 2016-02-02 NOTE — Telephone Encounter (Signed)
-----   Message from Burnard Hawthorne, [place] sent at 01/26/2016  8:08 PM EDT ----- Please let patient know that recent tests were ABNORMAL with the following advice :  CXR reflects mild PNA. I have sent rx to her pharmacy.    If unable to reach patient after 3 attempts, please let me know.   Thanks as always!  margaret

## 2016-02-03 NOTE — Telephone Encounter (Signed)
Spoke with the patient she saw the results on my chart and did start the medication, just a few days late.  thanks

## 2016-02-03 NOTE — Telephone Encounter (Signed)
Attempted to call again today, both home and cell , not able to leave a message today.

## 2016-02-03 NOTE — Telephone Encounter (Signed)
Pt called returning your call. Call pt @ [phone]. Thank you!

## 2016-02-11 ENCOUNTER — Ambulatory Visit: Payer: Medicare Other | Attending: Internal Medicine | Admitting: Physical Therapy

## 2016-02-11 DIAGNOSIS — M25652 Stiffness of left hip, not elsewhere classified: Secondary | ICD-10-CM | POA: Diagnosis present

## 2016-02-11 DIAGNOSIS — M25552 Pain in left hip: Secondary | ICD-10-CM | POA: Insufficient documentation

## 2016-02-11 DIAGNOSIS — M6281 Muscle weakness (generalized): Secondary | ICD-10-CM | POA: Diagnosis present

## 2016-02-11 NOTE — Therapy (Signed)
[facility] [address], [place], [address] Phone: [phone]   Fax:  [phone]  Physical Therapy Treatment  Patient Details  Name: Kara Griffin MRN: [id number] Date of Birth: 02/27/1936 Referring Provider: Ronda Fairly  Encounter Date: 02/11/2016      PT End of Session - 02/11/16 1528    Visit Number 1   Number of Visits 9   Date for PT Re-Evaluation 03/10/16   Authorization Type g code   PT Start Time [id number]   PT Stop Time 1530   PT Time Calculation (min) 45 min   Activity Tolerance Patient tolerated treatment well   Behavior During Therapy [facility] for tasks assessed/performed      Past Medical History  Diagnosis Date  . Hypertension   . Diabetes mellitus ([place])   . Hypercholesterolemia   . Environmental allergies   . Anemia   . Myelodysplastic syndrome ([place])     Sees Dr Jerrye Noble    Past Surgical History  Procedure Laterality Date  . Tonsilectomy, adenoidectomy, bilateral myringotomy and tubes  1963  . Cesarean section  1973  . Dilation and curettage of uterus    . Total abdominal hysterectomy w/ bilateral salpingoophorectomy  1985    secondary to abnormal cells and abnormal uterine bleeding  . Abdominal hysterectomy      There were no vitals filed for this visit.      Subjective Assessment - 02/11/16 1447    Subjective Pt rpeorts inconsistent pain in her left hip. She does have periods of no pain and periods of significant pain.Pain is when pt is sitting in bed, Pt has a history of back pain which has limited her activity. Pt is also limited due to MDS diagnosis so is not interested in extensive HEP   Diagnostic tests imaging   Patient Stated Goals decr. pain    Currently in Pain? Yes   Pain Score 3    Pain Location Hip   Pain Orientation Left            [facility] PT Assessment - 02/11/16 0001    Assessment   Medical Diagnosis left hip pain   Referring Provider Scott, C    Prior Therapy none   Precautions   Precautions None;Fall   Restrictions   Weight Bearing Restrictions No   Balance Screen   Has the patient fallen in the past 6 months No   Has the patient had a decrease in activity level because of a fear of falling?  Yes   Is the patient reluctant to leave their home because of a fear of falling?  No   Home Environment   Additional Comments no   Prior Function   Level of Independence Independent   Vocation Retired   Leisure reading, watching tv, helping friends, driving. Pt is limited by lack of energy with MDS   Posture/Postural Control   Posture Comments FHP   ROM / Strength   AROM / PROM / Strength AROM;Strength   AROM   Overall AROM Comments Difficulty to fully asess however L ER/IR limited and with some reproduction of pain in IT region   Strength   Overall Strength Comments 4/5 for all LE B, some reproduction of pain with knee flexion on L.   Palpation   Spinal mobility Deferred   Palpation comment exact reproduction of pain with palpation of proximal glute max fibers, IT band, vastus lateralis, greater trochanter regions. also reproduced pain with  palpation of this area with activation of glute med   Ambulation/Gait   Gait Comments decr. arm swing/decr. thoracic rotation.          Objective: STM performed on glute med, glute max, thoracolumbar fascia, vastus lateralis. Utilizing petrissage, cross friciton massage, compression, and TrP release. Followign extensive STM pt reported 0/10 pain with sitting. Attempted sitting breathing stretch, will reattempt at next session.                   PT Education - 03/12/16 1526    Education provided Yes   Education Details course of PT   Person(s) Educated Patient   Methods Explanation;Demonstration   Comprehension Verbalized understanding;Returned demonstration             PT Long Term Goals - 2016/03/12 1630    PT LONG TERM GOAL #1   Title Pt will be able to watch  tv in self selected position with pain no greater than 1/10   Baseline 4/10   Time 4   Period Weeks   Status New   PT LONG TERM GOAL #2   Title Pt will improve LEFS from 50 to 64 indicating improvement in self reported disability due to LE pain   Baseline 50/80   Time 4   Period Weeks   Status New   PT LONG TERM GOAL #3   Title Pt will demonstrate decr. pain to palpation over glute med indicating decr. sensitivity to pressure when sitting.   Time 4   Period Weeks   Status New               Plan - 2016/03/12 1529    Clinical Impression Statement Pt is a pleasant 80 y/o female with c/o hip pain. Pt also has h/o significant chronic back pain which she believes is unrelated to hip pain. Difficult to assess hip AROM/PROM due to pt inability to relax/follow cuing well. Pt demonstrates significant reproduction of pain with palpation of L glute, piriformis, hip. Pt also demonstrates muscle weakness which may be related but may be primarily due to MDS so will defer tx of this until  attempting primary treatment of STM for painful myofascial region.   Rehab Potential Good   Clinical Impairments Affecting Rehab Potential sedentary lifestyle, comorbidities, motivation   PT Frequency 2x / week   PT Duration 4 weeks   PT Treatment/Interventions ADLs/Self Care Home Management;Patient/family education;Dry needling;Therapeutic exercise;Manual techniques   PT Next Visit Plan continue with manual intervention, progress stretching   Consulted and Agree with Plan of Care Patient      Patient will benefit from skilled therapeutic intervention in order to improve the following deficits and impairments:  Pain, Difficulty walking, Dizziness, Improper body mechanics, Increased muscle spasms, Increased fascial restricitons, Decreased strength, Hypomobility  Visit Diagnosis: Pain in left hip  Muscle weakness (generalized)  Stiffness of left hip, not elsewhere classified       G-Codes - Mar 12, 2016  1632    Functional Assessment Tool Used LEFS, NPRS   Functional Limitation Changing and maintaining body position   Changing and Maintaining Body Position Current Status [id number]) At least 1 percent but less than 20 percent impaired, limited or restricted   Changing and Maintaining Body Position Goal Status [id number]) At least 1 percent but less than 20 percent impaired, limited or restricted      Problem List Patient Active Problem List   Diagnosis Date Noted  . Left hip pain 01/15/2016  . URI (upper respiratory infection)  08/09/2015  . Vaginal irritation 05/23/2015  . Health care maintenance 01/19/2015  . Obesity (BMI 30-39.9) 09/14/2014  . Hypercholesterolemia 08/13/2012  . Thyroid nodule 08/13/2012  . Osteopenia 08/13/2012  . Myelodysplastic syndrome ([place]) 06/04/2012  . Hypertension 06/04/2012  . Diabetes mellitus ([place]) 06/04/2012  . Environmental allergies 06/04/2012    Fisher,Benjamin PT DPT 02/11/2016, 4:33 PM  [facility] [address] [place], [address] Phone: [phone]   Fax:  [phone]  Name: Kara Griffin MRN: [id number] Date of Birth: Mar 27, 1936

## 2016-02-15 ENCOUNTER — Ambulatory Visit: Payer: Medicare Other | Admitting: Physical Therapy

## 2016-02-15 DIAGNOSIS — M25652 Stiffness of left hip, not elsewhere classified: Secondary | ICD-10-CM

## 2016-02-15 DIAGNOSIS — M25552 Pain in left hip: Secondary | ICD-10-CM

## 2016-02-15 NOTE — Therapy (Signed)
[place] [facility] [address], [place], [address] Phone: [phone]   Fax:  [phone]  Physical Therapy Treatment  Patient Details  Name: Kara Griffin MRN: [id number] Date of Birth: 1936/07/11 Referring Provider: Ronda Fairly  Encounter Date: 02/15/2016      PT End of Session - 02/15/16 0944    Visit Number 2   Number of Visits 9   Date for PT Re-Evaluation 03/10/16   Authorization Type g code   PT Start Time 0903   PT Stop Time 0945   PT Time Calculation (min) 42 min   Activity Tolerance Patient tolerated treatment well   Behavior During Therapy [facility] for tasks assessed/performed      Past Medical History  Diagnosis Date  . Hypertension   . Diabetes mellitus ([place])   . Hypercholesterolemia   . Environmental allergies   . Anemia   . Myelodysplastic syndrome ([place])     Sees Dr Jerrye Noble    Past Surgical History  Procedure Laterality Date  . Tonsilectomy, adenoidectomy, bilateral myringotomy and tubes  1963  . Cesarean section  1973  . Dilation and curettage of uterus    . Total abdominal hysterectomy w/ bilateral salpingoophorectomy  1985    secondary to abnormal cells and abnormal uterine bleeding  . Abdominal hysterectomy      There were no vitals filed for this visit.      Subjective Assessment - 02/15/16 0903    Subjective Pt reports improvement in pain for the past two days. She has also modified her sitting posture.   Diagnostic tests imaging   Patient Stated Goals decr. pain    Currently in Pain? No/denies   Pain Score 0-No pain               Objective: R SL STM performed on L IT, vastus lateralis, glutes, paraspinals. Following this pt reported feeling "looser" in hip.  Supine 3x1 min HS stretch, 3x10 slow ER, IR stretches, 3x10 knee to chest single.  Following this pt reported no back or hip pain, "I feel better than I have in weeks".  Performed hooklying  knee flops 3x20, manual and verbal cuing for relaxation and ROM/hip stretch.  Performed seated isometric glute activation chair press 3x5 with 5 sec. Holds.                   PT Education - 02/15/16 0943    Education provided Yes   Education Details HEP   Person(s) Educated Patient   Methods Explanation   Comprehension Verbalized understanding             PT Long Term Goals - 02/11/16 1630    PT LONG TERM GOAL #1   Title Pt will be able to watch tv in self selected position with pain no greater than 1/10   Baseline 4/10   Time 4   Period Weeks   Status New   PT LONG TERM GOAL #2   Title Pt will improve LEFS from 50 to 64 indicating improvement in self reported disability due to LE pain   Baseline 50/80   Time 4   Period Weeks   Status New   PT LONG TERM GOAL #3   Title Pt will demonstrate decr. pain to palpation over glute med indicating decr. sensitivity to pressure when sitting.   Time 4   Period Weeks   Status New  Plan - 02/15/16 0946    Clinical Impression Statement Pt has made progress with daily pain with light exercise. Continues to lack ability to relax hip with PT which is limiting tolerance for certain activities. Also reports incr. back pain which may be centralization.   Rehab Potential Good   Clinical Impairments Affecting Rehab Potential sedentary lifestyle, comorbidities, motivation   PT Frequency 2x / week   PT Duration 4 weeks   PT Treatment/Interventions ADLs/Self Care Home Management;Patient/family education;Dry needling;Therapeutic exercise;Manual techniques   PT Next Visit Plan continue with manual intervention, progress stretching   Consulted and Agree with Plan of Care Patient      Patient will benefit from skilled therapeutic intervention in order to improve the following deficits and impairments:  Pain, Difficulty walking, Dizziness, Improper body mechanics, Increased muscle spasms, Increased fascial  restricitons, Decreased strength, Hypomobility  Visit Diagnosis: Pain in left hip  Stiffness of left hip, not elsewhere classified     Problem List Patient Active Problem List   Diagnosis Date Noted  . Left hip pain 01/15/2016  . URI (upper respiratory infection) 08/09/2015  . Vaginal irritation 05/23/2015  . Health care maintenance 01/19/2015  . Obesity (BMI 30-39.9) 09/14/2014  . Hypercholesterolemia 08/13/2012  . Thyroid nodule 08/13/2012  . Osteopenia 08/13/2012  . Myelodysplastic syndrome ([place]) 06/04/2012  . Hypertension 06/04/2012  . Diabetes mellitus ([place]) 06/04/2012  . Environmental allergies 06/04/2012    Synda Bagent PT DPT 02/15/2016, 9:49 AM  [place] [facility] [address] [place], [address] Phone: [phone]   Fax:  [phone]  Name: Kara Griffin MRN: [id number] Date of Birth: 09-19-1935

## 2016-02-18 ENCOUNTER — Ambulatory Visit: Payer: Medicare Other | Admitting: Physical Therapy

## 2016-02-18 DIAGNOSIS — M25552 Pain in left hip: Secondary | ICD-10-CM

## 2016-02-18 DIAGNOSIS — M6281 Muscle weakness (generalized): Secondary | ICD-10-CM

## 2016-02-18 NOTE — Therapy (Signed)
[place] [facility] [address] [place], [address] Phone: [phone]   Fax:  [phone]  Physical Therapy Treatment  Patient Details  Name: Kara Griffin MRN: [id number] Date of Birth: 07-29-1936 Referring Provider: Ronda Fairly  Encounter Date: 02/18/2016      PT End of Session - 02/18/16 1503    Visit Number 3   Number of Visits 9   Date for PT Re-Evaluation 03/10/16   Authorization Type g code   PT Start Time 1430   PT Stop Time 1510   PT Time Calculation (min) 40 min   Activity Tolerance Patient tolerated treatment well   Behavior During Therapy [facility] for tasks assessed/performed      Past Medical History  Diagnosis Date  . Hypertension   . Diabetes mellitus ([place])   . Hypercholesterolemia   . Environmental allergies   . Anemia   . Myelodysplastic syndrome ([address])     Sees Dr Jerrye Noble    Past Surgical History  Procedure Laterality Date  . Tonsilectomy, adenoidectomy, bilateral myringotomy and tubes  1963  . Cesarean section  1973  . Dilation and curettage of uterus    . Total abdominal hysterectomy w/ bilateral salpingoophorectomy  1985    secondary to abnormal cells and abnormal uterine bleeding  . Abdominal hysterectomy      There were no vitals filed for this visit.      Subjective Assessment - 02/18/16 1502    Subjective Pt reports she is basically doing well, is having 10% of the pain she had from the beginning of therapy.   Diagnostic tests imaging   Patient Stated Goals decr. pain    Currently in Pain? No/denies   Pain Score 0-No pain                  Objective: Nu-step L1 x5 min, monitoring O2 sats which stayed above 98% at all times.  Clamshells 3x15, pt required cuing for avoiding lumbar rotation and manual correction of this. Pt reported "feeling it" after 8th rep.  Supine bridging 3x10, pt unable to go very high and felt much more activation on R  side.  SL medial hip glides 5x1 min with pillows for abduction of hip and relaxation of pelvis.  STM for vastus lateralis, IT band region, piriformis.  Following session pt reported no pain, PT encouraged pt to monitor her nighttime pain.                    PT Long Term Goals - 02/11/16 1630    PT LONG TERM GOAL #1   Title Pt will be able to watch tv in self selected position with pain no greater than 1/10   Baseline 4/10   Time 4   Period Weeks   Status New   PT LONG TERM GOAL #2   Title Pt will improve LEFS from 50 to 64 indicating improvement in self reported disability due to LE pain   Baseline 50/80   Time 4   Period Weeks   Status New   PT LONG TERM GOAL #3   Title Pt will demonstrate decr. pain to palpation over glute med indicating decr. sensitivity to pressure when sitting.   Time 4   Period Weeks   Status New               Plan - 02/18/16 1512    Clinical Impression Statement Pt continues to improve and is  now having no pain during the day. Continues to have some nighttime pain which may be related to arthritic pain. Noted with hip medial glides pt had incr. pain so performed these at low grade which appears to improve pain. Pt is also continuing to be exercise intolerant although she is open to progressing ther ex.   Rehab Potential Good   Clinical Impairments Affecting Rehab Potential sedentary lifestyle, comorbidities, motivation   PT Frequency 2x / week   PT Duration 4 weeks   PT Treatment/Interventions ADLs/Self Care Home Management;Patient/family education;Dry needling;Therapeutic exercise;Manual techniques   PT Next Visit Plan continue with manual intervention, progress stretching   Consulted and Agree with Plan of Care Patient      Patient will benefit from skilled therapeutic intervention in order to improve the following deficits and impairments:  Pain, Difficulty walking, Dizziness, Improper body mechanics, Increased muscle spasms,  Increased fascial restricitons, Decreased strength, Hypomobility  Visit Diagnosis: Pain in left hip  Muscle weakness (generalized)     Problem List Patient Active Problem List   Diagnosis Date Noted  . Left hip pain 01/15/2016  . URI (upper respiratory infection) 08/09/2015  . Vaginal irritation 05/23/2015  . Health care maintenance 01/19/2015  . Obesity (BMI 30-39.9) 09/14/2014  . Hypercholesterolemia 08/13/2012  . Thyroid nodule 08/13/2012  . Osteopenia 08/13/2012  . Myelodysplastic syndrome ([place]) 06/04/2012  . Hypertension 06/04/2012  . Diabetes mellitus ([place]) 06/04/2012  . Environmental allergies 06/04/2012    Fisher,Benjamin PT DPT 02/18/2016, 3:18 PM  [place] [facility] [address], [place], [address] Phone: [phone]   Fax:  [phone]  Name: Kara Griffin MRN: [id number] Date of Birth: [birth year]

## 2016-02-22 ENCOUNTER — Ambulatory Visit: Payer: Medicare Other

## 2016-02-22 ENCOUNTER — Encounter: Payer: Self-pay | Admitting: Physical Therapy

## 2016-02-22 DIAGNOSIS — M25552 Pain in left hip: Secondary | ICD-10-CM | POA: Diagnosis not present

## 2016-02-22 DIAGNOSIS — M6281 Muscle weakness (generalized): Secondary | ICD-10-CM

## 2016-02-22 NOTE — Therapy (Signed)
[place] [facility] [address] [place], [address] Phone: [phone]   Fax:  [phone]  Physical Therapy Treatment  Patient Details  Name: Kara Griffin MRN: [id number] Date of Birth: May 25, 1936 Referring Provider: Ronda Fairly  Encounter Date: 02/22/2016      PT End of Session - 02/22/16 1043    Visit Number 4   Number of Visits 9   Date for PT Re-Evaluation 03/10/16   Authorization Type g code   PT Start Time 1033   PT Stop Time 1115   PT Time Calculation (min) 42 min   Activity Tolerance Patient tolerated treatment well   Behavior During Therapy [facility] for tasks assessed/performed      Past Medical History  Diagnosis Date  . Hypertension   . Diabetes mellitus ([place])   . Hypercholesterolemia   . Environmental allergies   . Anemia   . Myelodysplastic syndrome ([place])     Sees Dr Jerrye Noble    Past Surgical History  Procedure Laterality Date  . Tonsilectomy, adenoidectomy, bilateral myringotomy and tubes  1963  . Cesarean section  1973  . Dilation and curettage of uterus    . Total abdominal hysterectomy w/ bilateral salpingoophorectomy  1985    secondary to abnormal cells and abnormal uterine bleeding  . Abdominal hysterectomy      There were no vitals filed for this visit.      Subjective Assessment - 02/22/16 1035    Subjective Pt reports that she is doing well on this date. Complains of 2/10 L hip/low back pain. Unable to describe the quality of the pain on this date. She states that she did exercises on Saturday morning and then cleaned house. By the afternoon she states she was having a considerable amount of pain. No specific questions or concerns at this time. Reports that she is performing HEP without issue.    Diagnostic tests imaging   Patient Stated Goals decr. pain    Currently in Pain? Yes   Pain Score 2    Pain Location Hip   Pain Orientation Left   Pain Descriptors /  Indicators --  Unable to describe   Pain Type Chronic pain   Pain Onset More than a month ago      Objective:  Ther-ex Nu-step L1 x 5 min, while history obtained, (2 minutes unbilled); Squats with heavy education and verbal/tactile cues for proper technique, denies increase in knee pain 2 x 10; Yellow Tband lateral stepping 6' x 8, repeated for second bout; Clamshells 3 x 15, pt required cuing for avoiding lumbar rotation and manual correction; Supine bridging 3 x 10, pt unable to go very high, pt initially reports pain but states that it decreases with repetition;  Manual Therapy STM for piriformis and deep external rotators; L single knee to chest stretch 30 seconds x 3; L hip IR and ER stretches 30 seconds x 3 each;                           PT Education - 02/22/16 1042    Education provided Yes   Education Details Reinforced HEP, squat technique   Person(s) Educated Patient   Methods Explanation   Comprehension Verbalized understanding             PT Long Term Goals - 02/11/16 1630    PT LONG TERM GOAL #1   Title Pt will be able to  watch tv in self selected position with pain no greater than 1/10   Baseline 4/10   Time 4   Period Weeks   Status New   PT LONG TERM GOAL #2   Title Pt will improve LEFS from 50 to 64 indicating improvement in self reported disability due to LE pain   Baseline 50/80   Time 4   Period Weeks   Status New   PT LONG TERM GOAL #3   Title Pt will demonstrate decr. pain to palpation over glute med indicating decr. sensitivity to pressure when sitting.   Time 4   Period Weeks   Status New               Plan - 02/22/16 1042    Clinical Impression Statement Pt demonstrates bilateral hip abductor weakness during squats with bilateral LE valgus collapse. She appears somewhat anxious during therapy session due to pain. Pt is tender to palpation along TFL as well as deep external rotators. Following therapy she  reports no further pain. Pt encouraged to continue HEP and follow-up as scheduled.    Rehab Potential Good   Clinical Impairments Affecting Rehab Potential sedentary lifestyle, comorbidities, motivation   PT Frequency 2x / week   PT Duration 4 weeks   PT Treatment/Interventions ADLs/Self Care Home Management;Patient/family education;Dry needling;Therapeutic exercise;Manual techniques   PT Next Visit Plan continue with manual intervention, progress stretching, ther-ex   PT Home Exercise Plan As prescribed previously   Consulted and Agree with Plan of Care Patient      Patient will benefit from skilled therapeutic intervention in order to improve the following deficits and impairments:  Pain, Difficulty walking, Dizziness, Improper body mechanics, Increased muscle spasms, Increased fascial restricitons, Decreased strength, Hypomobility  Visit Diagnosis: Pain in left hip  Muscle weakness (generalized)     Problem List Patient Active Problem List   Diagnosis Date Noted  . Left hip pain 01/15/2016  . URI (upper respiratory infection) 08/09/2015  . Vaginal irritation 05/23/2015  . Health care maintenance 01/19/2015  . Obesity (BMI 30-39.9) 09/14/2014  . Hypercholesterolemia 08/13/2012  . Thyroid nodule 08/13/2012  . Osteopenia 08/13/2012  . Myelodysplastic syndrome ([place]) 06/04/2012  . Hypertension 06/04/2012  . Diabetes mellitus ([place]) 06/04/2012  . Environmental allergies 06/04/2012   Phillips Grout PT, DPT   Huprich,Jason 02/22/2016, 11:15 AM  [place] [facility] [address], [place], [address] Phone: [phone]   Fax:  [phone]  Name: Keaton Mihalovich MRN: [id number] Date of Birth: 1936-08-15

## 2016-02-25 ENCOUNTER — Encounter: Payer: Self-pay | Admitting: Physical Therapy

## 2016-02-25 ENCOUNTER — Ambulatory Visit: Payer: Medicare Other

## 2016-02-25 DIAGNOSIS — M25552 Pain in left hip: Secondary | ICD-10-CM

## 2016-02-25 DIAGNOSIS — M6281 Muscle weakness (generalized): Secondary | ICD-10-CM

## 2016-02-25 NOTE — Therapy (Signed)
[place] [facility] [address] [place], [address] Phone: [phone]   Fax:  [phone]  Physical Therapy Treatment  Patient Details  Name: Kara Griffin MRN: [id number] Date of Birth: 16-Oct-1935 Referring Provider: Ronda Fairly  Encounter Date: 02/25/2016      PT End of Session - 02/25/16 1438    Visit Number 5   Number of Visits 9   Date for PT Re-Evaluation 03/10/16   Authorization Type g code   PT Start Time 1430   PT Stop Time 1515   PT Time Calculation (min) 45 min   Activity Tolerance Patient tolerated treatment well   Behavior During Therapy [facility] for tasks assessed/performed      Past Medical History  Diagnosis Date  . Hypertension   . Diabetes mellitus ([place])   . Hypercholesterolemia   . Environmental allergies   . Anemia   . Myelodysplastic syndrome ([place])     Sees Dr Jerrye Noble    Past Surgical History  Procedure Laterality Date  . Tonsilectomy, adenoidectomy, bilateral myringotomy and tubes  1963  . Cesarean section  1973  . Dilation and curettage of uterus    . Total abdominal hysterectomy w/ bilateral salpingoophorectomy  1985    secondary to abnormal cells and abnormal uterine bleeding  . Abdominal hysterectomy      There were no vitals filed for this visit.      Subjective Assessment - 02/25/16 1437    Subjective Pt denies L hip pain or back pain upon arrival. She was able to perform "some" of her HEP and did some walking around her neighborhood. No specific questions or concerns at this time.    Diagnostic tests imaging   Patient Stated Goals decr. pain    Currently in Pain? No/denies       Objective:  Manual Therapy Heat applied to L hip in sidelying at start of session x 5 minutes during history; STM for piriformis and deep external rotators; L single knee to chest stretch 30 seconds x 3; L hip IR and ER stretches 30 seconds x 3 each;  Ther-ex Squats with  heavy education and verbal/tactile cues for proper technique, denies increase in knee pain 2 x 10; Red Tband lateral stepping 6' x 8, repeated for second bout; Lateral step up and over to 6" step 2 x 10; Static lunges 2 x 10; Seated clamshells 2 x 15, pt unable to compensate in sitting. Demonstrates pain-free motion but weakness;                          PT Education - 02/25/16 1438    Education provided Yes   Education Details Continue HEP   Person(s) Educated Patient   Methods Explanation   Comprehension Verbalized understanding             PT Long Term Goals - 02/11/16 1630    PT LONG TERM GOAL #1   Title Pt will be able to watch tv in self selected position with pain no greater than 1/10   Baseline 4/10   Time 4   Period Weeks   Status New   PT LONG TERM GOAL #2   Title Pt will improve LEFS from 50 to 64 indicating improvement in self reported disability due to LE pain   Baseline 50/80   Time 4   Period Weeks   Status New   PT LONG TERM GOAL #3  Title Pt will demonstrate decr. pain to palpation over glute med indicating decr. sensitivity to pressure when sitting.   Time 4   Period Weeks   Status New               Plan - 02/25/16 1438    Clinical Impression Statement Pt reports resolution of posterior L hip pain during regular activities. She states that the only time she experiences pain is with deep palpation during physical therapy. She is still tender along TFL and deep external rotators with palpation. Pt is likely ready for discharge in the next few visits. Pt notified that primary therapist will discuss plan of care and discharge at next visit.    Rehab Potential Good   Clinical Impairments Affecting Rehab Potential sedentary lifestyle, comorbidities, motivation   PT Frequency 2x / week   PT Duration 4 weeks   PT Treatment/Interventions ADLs/Self Care Home Management;Patient/family education;Dry needling;Therapeutic  exercise;Manual techniques   PT Next Visit Plan Repeat outcome measures and update goals. Consider possible discharge. Continue with manual intervention, progress stretching, ther-ex   PT Home Exercise Plan As prescribed previously   Consulted and Agree with Plan of Care Patient      Patient will benefit from skilled therapeutic intervention in order to improve the following deficits and impairments:  Pain, Difficulty walking, Dizziness, Improper body mechanics, Increased muscle spasms, Increased fascial restricitons, Decreased strength, Hypomobility  Visit Diagnosis: Muscle weakness (generalized)  Pain in left hip     Problem List Patient Active Problem List   Diagnosis Date Noted  . Left hip pain 01/15/2016  . URI (upper respiratory infection) 08/09/2015  . Vaginal irritation 05/23/2015  . Health care maintenance 01/19/2015  . Obesity (BMI 30-39.9) 09/14/2014  . Hypercholesterolemia 08/13/2012  . Thyroid nodule 08/13/2012  . Osteopenia 08/13/2012  . Myelodysplastic syndrome ([place]) 06/04/2012  . Hypertension 06/04/2012  . Diabetes mellitus ([place]) 06/04/2012  . Environmental allergies 06/04/2012   Phillips Grout PT, DPT   Huprich,Jason 02/25/2016, 4:56 PM  [place] [facility] [address], [place], [address] Phone: [phone]   Fax:  [phone]  Name: Kara Griffin MRN: [id number] Date of Birth: 01/22/1936

## 2016-02-29 ENCOUNTER — Ambulatory Visit: Payer: Medicare Other | Admitting: Physical Therapy

## 2016-02-29 DIAGNOSIS — M6281 Muscle weakness (generalized): Secondary | ICD-10-CM

## 2016-02-29 DIAGNOSIS — M25552 Pain in left hip: Secondary | ICD-10-CM | POA: Diagnosis not present

## 2016-02-29 NOTE — Therapy (Signed)
[facility] [address] [place], [address] Phone: [phone]   Fax:  [phone]  Physical Therapy Treatment/Discharge  Patient Details  Name: Kara Griffin MRN: [id number] Date of Birth: [birth year] Referring Provider: Ronda Fairly  Encounter Date: 02/29/2016      PT End of Session - 02/29/16 1437    Visit Number 6   Number of Visits 9   Date for PT Re-Evaluation 03/10/16   Authorization Type g code   PT Start Time 1430   PT Stop Time 1510   PT Time Calculation (min) 40 min   Activity Tolerance Patient tolerated treatment well   Behavior During Therapy [facility] for tasks assessed/performed      Past Medical History  Diagnosis Date  . Hypertension   . Diabetes mellitus ([place])   . Hypercholesterolemia   . Environmental allergies   . Anemia   . Myelodysplastic syndrome ([place])     Sees Dr Jerrye Noble    Past Surgical History  Procedure Laterality Date  . Tonsilectomy, adenoidectomy, bilateral myringotomy and tubes  1963  . Cesarean section  1973  . Dilation and curettage of uterus    . Total abdominal hysterectomy w/ bilateral salpingoophorectomy  1985    secondary to abnormal cells and abnormal uterine bleeding  . Abdominal hysterectomy      There were no vitals filed for this visit.      Subjective Assessment - 02/29/16 1428    Subjective "I'm doing fine". Pt reports no pain at this time.   Diagnostic tests imaging   Patient Stated Goals decr. pain    Currently in Pain? No/denies               Objective: 6MWT, pt ambulated 800' in 6 min, O2 maintained greater than 97%. Community walking assessment including ramp, stairs, curb, all with and without UE support. Noted significantly incr. Ability to perform all of these, no requirement of UE support although pt does acknowledge improvement in ability when she does use UE. Extensive education on maintaining activity level,  strengthening. LEFS 58/80. Pt requested to avoid nu-step due to mild knee pain.                   PT Education - 02/29/16 1436    Education provided Yes   Education Details discharge instructions   Person(s) Educated Patient   Methods Explanation   Comprehension Verbalized understanding             PT Long Term Goals - 02/29/16 1506    PT LONG TERM GOAL #1   Title Pt will be able to watch tv in self selected position with pain no greater than 1/10   Baseline 4/10   Time 4   Period Weeks   Status Achieved   PT LONG TERM GOAL #2   Title Pt will improve LEFS from 50 to 64 indicating improvement in self reported disability due to LE pain   Baseline 50/80   Time 4   Period Weeks   Status Partially Met   PT LONG TERM GOAL #3   Title Pt will demonstrate decr. pain to palpation over glute med indicating decr. sensitivity to pressure when sitting.   Time 4   Period Weeks   Status Achieved               Plan - 02/29/16 1437    Clinical Impression Statement At this time pt is appropriate for  d/c. Her primary problems at this time appear to be related to overall energy level. She has  minimal to no pain now in hip, is not taking pain medications currently and has increased her activity level. PT encouraged pt to continue to maintain incr. activity level and to stay in touch with PT if she has any future questions.   Rehab Potential Good   Clinical Impairments Affecting Rehab Potential sedentary lifestyle, comorbidities, motivation   PT Frequency 2x / week   PT Duration 4 weeks   PT Treatment/Interventions ADLs/Self Care Home Management;Patient/family education;Dry needling;Therapeutic exercise;Manual techniques   PT Next Visit Plan Repeat outcome measures and update goals. Consider possible discharge. Continue with manual intervention, progress stretching, ther-ex   PT Home Exercise Plan As prescribed previously   Consulted and Agree with Plan of Care Patient       Patient will benefit from skilled therapeutic intervention in order to improve the following deficits and impairments:  Pain, Difficulty walking, Dizziness, Improper body mechanics, Increased muscle spasms, Increased fascial restricitons, Decreased strength, Hypomobility  Visit Diagnosis: Muscle weakness (generalized)       G-Codes - 2016-03-23 1506    Functional Assessment Tool Used LEFS, NPRS   Functional Limitation Changing and maintaining body position   Changing and Maintaining Body Position Current Status (X8333) At least 1 percent but less than 20 percent impaired, limited or restricted   Changing and Maintaining Body Position Goal Status (O3291) At least 1 percent but less than 20 percent impaired, limited or restricted   Changing and Maintaining Body Position Discharge Status (B1660) At least 1 percent but less than 20 percent impaired, limited or restricted      Problem List Patient Active Problem List   Diagnosis Date Noted  . Left hip pain 01/15/2016  . URI (upper respiratory infection) 08/09/2015  . Vaginal irritation 05/23/2015  . Health care maintenance 01/19/2015  . Obesity (BMI 30-39.9) 09/14/2014  . Hypercholesterolemia 08/13/2012  . Thyroid nodule 08/13/2012  . Osteopenia 08/13/2012  . Myelodysplastic syndrome ([place]) 06/04/2012  . Hypertension 06/04/2012  . Diabetes mellitus ([place]) 06/04/2012  . Environmental allergies 06/04/2012    Fisher,Benjamin PT DPT Mar 23, 2016, 3:08 PM  [facility] [address], [place], [address] Phone: [phone]   Fax:  [phone]  Name: Kara Griffin MRN: [id number] Date of Birth: 1936-05-06

## 2016-03-03 ENCOUNTER — Ambulatory Visit: Payer: Medicare Other | Admitting: Physical Therapy

## 2016-03-07 ENCOUNTER — Ambulatory Visit: Payer: Medicare Other | Admitting: Physical Therapy

## 2016-03-10 ENCOUNTER — Ambulatory Visit: Payer: Medicare Other | Admitting: Physical Therapy

## 2016-04-15 ENCOUNTER — Encounter: Payer: Self-pay | Admitting: Internal Medicine

## 2016-04-15 LAB — HM DIABETES EYE EXAM

## 2016-05-17 ENCOUNTER — Ambulatory Visit (INDEPENDENT_AMBULATORY_CARE_PROVIDER_SITE_OTHER): Payer: Medicare Other | Admitting: Internal Medicine

## 2016-05-17 ENCOUNTER — Encounter: Payer: Self-pay | Admitting: Internal Medicine

## 2016-05-17 VITALS — BP 128/70 | HR 80 | Temp 97.8°F | Ht 61.0 in | Wt 178.4 lb

## 2016-05-17 DIAGNOSIS — D469 Myelodysplastic syndrome, unspecified: Secondary | ICD-10-CM

## 2016-05-17 DIAGNOSIS — E669 Obesity, unspecified: Secondary | ICD-10-CM

## 2016-05-17 DIAGNOSIS — I10 Essential (primary) hypertension: Secondary | ICD-10-CM

## 2016-05-17 DIAGNOSIS — E041 Nontoxic single thyroid nodule: Secondary | ICD-10-CM | POA: Diagnosis not present

## 2016-05-17 DIAGNOSIS — Z23 Encounter for immunization: Secondary | ICD-10-CM

## 2016-05-17 DIAGNOSIS — E119 Type 2 diabetes mellitus without complications: Secondary | ICD-10-CM

## 2016-05-17 DIAGNOSIS — E7800 Pure hypercholesterolemia, unspecified: Secondary | ICD-10-CM

## 2016-05-17 DIAGNOSIS — G629 Polyneuropathy, unspecified: Secondary | ICD-10-CM

## 2016-05-17 NOTE — Progress Notes (Signed)
Pre visit review using our clinic review tool, if applicable. No additional management support is needed unless otherwise documented below in the visit note. 

## 2016-05-17 NOTE — Progress Notes (Signed)
Patient ID: Kara Griffin, female   DOB: 03-Feb-1936, 80 y.o.   MRN: [id number]   Subjective:    Patient ID: Kara Griffin, female    DOB: 08-06-1936, 81 y.o.   MRN: [id number]  HPI  Patient here for a scheduled follow up.  Has MDS.  Sees Dr Florene Glen.  Just evaluated 05/10/16.  Stable.  She reports overall she is doing relatively well.  Tries to stay active.  No chest pain.  No sob.  No acid reflux.  No abdominal pain or cramping.  Bowels stable.  She reports a little light headed sensation.  Notices if gets up to fast or turning fast.  No headache.  Overall stable.     Past Medical History:  Diagnosis Date  . Anemia   . Diabetes mellitus ([place])   . Environmental allergies   . Hypercholesterolemia   . Hypertension   . Myelodysplastic syndrome ([place])    Sees Dr Jerrye Noble   Past Surgical History:  Procedure Laterality Date  . ABDOMINAL HYSTERECTOMY    . [place]  . DILATION AND CURETTAGE OF UTERUS    . TONSILECTOMY, ADENOIDECTOMY, BILATERAL MYRINGOTOMY AND TUBES  1963  . TOTAL ABDOMINAL HYSTERECTOMY W/ BILATERAL SALPINGOOPHORECTOMY  1985   secondary to abnormal cells and abnormal uterine bleeding   Family History  Problem Relation Age of Onset  . Heart disease Father     Deceased (MI) - 21  . Diabetes Mother     Deceased  . Thyroid cancer Mother   . Osteoarthritis Mother   . Asthma    . Diabetes Brother   . Heart disease Maternal Grandmother     myocardial infarction - 26  . Asthma Maternal Grandmother   . Heart disease Paternal Grandmother     myocardial infarction-52  . Heart disease Brother    Social History   Social History  . Marital status: Divorced    Spouse name: N/A  . Number of children: 3  . Years of education: N/A   Occupational History  . retired Games developer    Social History Main Topics  . Smoking status: Never Smoker  . Smokeless tobacco: Never Used  . Alcohol use No  . Drug use: No  . Sexual activity: Not Asked    Other Topics Concern  . None   Social History Narrative  . None    Outpatient Encounter Prescriptions as of 05/17/2016  Medication Sig  . aspirin 81 MG tablet Take 81 mg by mouth daily.  Marland Kitchen azithromycin (ZITHROMAX) 250 MG tablet Tale 500 mg PO on day 1, then 250 mg PO q24h x 4 days.  . Calcium Carbonate-Vitamin D (CALCIUM 600+D) 600-400 MG-UNIT per tablet Take 1 tablet by mouth daily. Reported on 01/15/2016  . cyanocobalamin 500 MCG tablet Take 500 mcg by mouth daily.  . fish oil-omega-3 fatty acids 1000 MG capsule Take 1 g by mouth daily.   . fluticasone (FLONASE) 50 MCG/ACT nasal spray Place 2 sprays into both nostrils daily.  Marland Kitchen lenalidomide (REVLIMID) 10 MG capsule Take 10 mg by mouth daily.  Marland Kitchen losartan (COZAAR) 50 MG tablet TAKE 1 TABLET BY MOUTH EVERY DAY  . Multiple Vitamin (MULTIVITAMIN) tablet Take 1 tablet by mouth daily.  Marland Kitchen nystatin cream (MYCOSTATIN) Apply 1 application topically 2 (two) times daily. (Patient taking differently: Apply 1 application topically 2 (two) times daily as needed. )  . ONE TOUCH LANCETS MISC Use to check sugars 1-2 times a day Dx. E11.9 (One  Touch Ultra Mini)  . ONE TOUCH ULTRA TEST test strip USE AS DIRECTED TO TEST BLOOD SUGAR TWICE DAILY  . [DISCONTINUED] metFORMIN (GLUCOPHAGE) 1000 MG tablet TAKE 1 TABLET BY MOUTH TWICE DAILY   No facility-administered encounter medications on file as of 05/17/2016.     Review of Systems  Constitutional: Negative for appetite change and unexpected weight change.  HENT: Negative for congestion and sinus pressure.   Respiratory: Negative for cough, chest tightness and shortness of breath.   Cardiovascular: Negative for chest pain, palpitations and leg swelling.  Gastrointestinal: Negative for abdominal pain, diarrhea, nausea and vomiting.  Genitourinary: Negative for difficulty urinating and dysuria.  Musculoskeletal: Negative for gait problem and myalgias.  Skin: Negative for color change and rash.   Neurological: Positive for light-headedness. Negative for headaches.  Psychiatric/Behavioral: Negative for agitation and dysphoric mood.       Objective:     Blood pressure rechecked by me:  128/76  Physical Exam  Constitutional: She appears well-developed and well-nourished. No distress.  HENT:  Nose: Nose normal.  Mouth/Throat: Oropharynx is clear and moist.  Neck: Neck supple. No thyromegaly present.  Cardiovascular: Normal rate and regular rhythm.   Pulmonary/Chest: Breath sounds normal. No respiratory distress. She has no wheezes.  Abdominal: Soft. Bowel sounds are normal. There is no tenderness.  Musculoskeletal: She exhibits no edema or tenderness.  Lymphadenopathy:    She has no cervical adenopathy.  Skin: No rash noted. No erythema.  Psychiatric: She has a normal mood and affect. Her behavior is normal.    BP 128/70   Pulse 80   Temp 97.8 F (36.6 C) (Oral)   Ht _0  (1.549 m)   Wt 178 lb 6.4 oz (80.9 kg)   LMP 08/13/1984   SpO2 95%   BMI 33.71 kg/m  Wt Readings from Last 3 Encounters:  05/17/16 178 lb 6.4 oz (80.9 kg)  01/26/16 177 lb 3.2 oz (80.4 kg)  01/15/16 180 lb 6.4 oz (81.8 kg)     Lab Results  Component Value Date   WBC 4.2 06/10/2014   HGB 13.5 06/10/2014   HCT 40 06/10/2014   PLT 133 (A) 06/10/2014   GLUCOSE 172 (H) 01/15/2016   CHOL 160 10/05/2015   TRIG 215 (H) 10/05/2015   HDL 50 10/05/2015   LDLDIRECT 85.3 08/23/2013   LDLCALC 67 10/05/2015   ALT 23 10/05/2015   AST 17 10/05/2015   NA 139 01/15/2016   K 3.9 01/15/2016   CL 106 01/15/2016   CREATININE 0.64 01/15/2016   BUN 18 01/15/2016   CO2 24 01/15/2016   TSH 2.532 10/05/2015   HGBA1C 6.8 (H) 01/15/2016   MICROALBUR 1.0 01/16/2015    Dg Chest 2 View  Result Date: 01/26/2016 CLINICAL DATA:  Acute onset of congestion and cough. Initial encounter. EXAM: CHEST  2 VIEW COMPARISON:  CT of the chest performed 08/17/2015 FINDINGS: The lungs are hyperexpanded, with flattening of  the hemidiaphragms, compatible with COPD. Mild apparent lingular opacity could reflect mild pneumonia. Underlying peribronchial thickening is noted. Mild right apical scarring is seen. No pleural effusion or pneumothorax is identified. The heart is normal in size; the mediastinal contour is within normal limits. No acute osseous abnormalities are seen. IMPRESSION: 1. Mild apparent lingular opacity could reflect mild pneumonia. 2. Findings of COPD, and mild right apical scarring. Electronically Signed   By: Garald Balding M.D.   On: 01/26/2016 18:25   Dg Lumbar Spine 2-3 Views  Result Date: 01/26/2016 CLINICAL DATA:  Chronic left lower back pain.  Initial encounter. EXAM: LUMBAR SPINE - 2-3 VIEW COMPARISON:  None. FINDINGS: There is no evidence of fracture or subluxation. Vertebral bodies demonstrate normal height and alignment. There is mild disc space narrowing at L4-L5. Mild facet disease is noted along the lower lumbar spine. The visualized bowel gas pattern is unremarkable in appearance; air and stool are noted within the colon. The sacroiliac joints are within normal limits. IMPRESSION: No evidence of fracture or subluxation along the lumbar spine. Electronically Signed   By: Garald Balding M.D.   On: 01/26/2016 18:26   Dg Hip Unilat With Pelvis 2-3 Views Left  Result Date: 01/26/2016 CLINICAL DATA:  Chronic left hip pain.  Initial encounter. EXAM: DG HIP (WITH OR WITHOUT PELVIS) 2-3V LEFT COMPARISON:  None. FINDINGS: There is no evidence of fracture or dislocation. Both femoral heads are seated normally within their respective acetabula. The proximal left femur appears intact. No significant degenerative change is appreciated. The sacroiliac joints are unremarkable in appearance. The visualized bowel gas pattern is grossly unremarkable in appearance. Scattered phleboliths are noted within the pelvis. IMPRESSION: No evidence of fracture or dislocation. Electronically Signed   By: Garald Balding M.D.    On: 01/26/2016 18:27       Assessment & Plan:   Problem List Items Addressed This Visit    Diabetes mellitus ([place])    Low carb diet and exercise.  Follow met b and a1c.        Hypercholesterolemia    Low cholesterol diet and exercise.  Follow lipid panel.        Hypertension    Blood pressure under good control.  Continue same medication regimen.  Follow pressures.  Follow metabolic panel.        Myelodysplastic syndrome ([place])    Followed by hematology.  Stable.  Just saw Dr Florene Glen.        Neuropathy ([place]) - Primary    Stable.  Follow.        Obesity (BMI 30-39.9)    Diet and exercise.  Follow.       Thyroid nodule    Worked up by Dr Harlow Asa.  Follow tsh.         Other Visit Diagnoses   None.      Einar Pheasant, MD

## 2016-05-18 ENCOUNTER — Other Ambulatory Visit: Payer: Self-pay | Admitting: Internal Medicine

## 2016-05-22 ENCOUNTER — Encounter: Payer: Self-pay | Admitting: Internal Medicine

## 2016-05-22 NOTE — Assessment & Plan Note (Signed)
Blood pressure under good control.  Continue same medication regimen.  Follow pressures.  Follow metabolic panel.   

## 2016-05-22 NOTE — Assessment & Plan Note (Signed)
Low carb diet and exercise.  Follow met b and a1c.   

## 2016-05-22 NOTE — Assessment & Plan Note (Signed)
Diet and exercise.  Follow.  

## 2016-05-22 NOTE — Assessment & Plan Note (Signed)
Low cholesterol diet and exercise.  Follow lipid panel.   

## 2016-05-22 NOTE — Assessment & Plan Note (Signed)
Stable.  Follow.   

## 2016-05-22 NOTE — Assessment & Plan Note (Signed)
Followed by hematology.  Stable.  Just saw Dr Florene Glen.

## 2016-05-22 NOTE — Assessment & Plan Note (Signed)
Worked up by Dr Gerkin.  Follow tsh.  

## 2016-07-04 ENCOUNTER — Encounter: Payer: Self-pay | Admitting: Internal Medicine

## 2016-09-19 ENCOUNTER — Encounter: Payer: Self-pay | Admitting: Internal Medicine

## 2016-09-19 ENCOUNTER — Ambulatory Visit (INDEPENDENT_AMBULATORY_CARE_PROVIDER_SITE_OTHER): Payer: Medicare Other | Admitting: Internal Medicine

## 2016-09-19 VITALS — BP 136/82 | HR 84 | Temp 98.2°F | Ht 61.0 in | Wt 176.0 lb

## 2016-09-19 DIAGNOSIS — R9389 Abnormal findings on diagnostic imaging of other specified body structures: Secondary | ICD-10-CM

## 2016-09-19 DIAGNOSIS — Z0000 Encounter for general adult medical examination without abnormal findings: Secondary | ICD-10-CM

## 2016-09-19 DIAGNOSIS — Z9109 Other allergy status, other than to drugs and biological substances: Secondary | ICD-10-CM

## 2016-09-19 DIAGNOSIS — R938 Abnormal findings on diagnostic imaging of other specified body structures: Secondary | ICD-10-CM

## 2016-09-19 DIAGNOSIS — E119 Type 2 diabetes mellitus without complications: Secondary | ICD-10-CM

## 2016-09-19 DIAGNOSIS — G629 Polyneuropathy, unspecified: Secondary | ICD-10-CM

## 2016-09-19 DIAGNOSIS — M8580 Other specified disorders of bone density and structure, unspecified site: Secondary | ICD-10-CM | POA: Diagnosis not present

## 2016-09-19 DIAGNOSIS — E041 Nontoxic single thyroid nodule: Secondary | ICD-10-CM

## 2016-09-19 DIAGNOSIS — D469 Myelodysplastic syndrome, unspecified: Secondary | ICD-10-CM

## 2016-09-19 DIAGNOSIS — E669 Obesity, unspecified: Secondary | ICD-10-CM

## 2016-09-19 DIAGNOSIS — Z1231 Encounter for screening mammogram for malignant neoplasm of breast: Secondary | ICD-10-CM | POA: Diagnosis not present

## 2016-09-19 DIAGNOSIS — I10 Essential (primary) hypertension: Secondary | ICD-10-CM

## 2016-09-19 DIAGNOSIS — E7800 Pure hypercholesterolemia, unspecified: Secondary | ICD-10-CM

## 2016-09-19 MED ORDER — SITAGLIPTIN PHOSPHATE 100 MG PO TABS: 100.0000 mg | ORAL_TABLET | Freq: Every day | ORAL | 2 refills | Status: DC

## 2016-09-19 NOTE — Progress Notes (Signed)
Pre visit review using our clinic review tool, if applicable. No additional management support is needed unless otherwise documented below in the visit note. 

## 2016-09-19 NOTE — Assessment & Plan Note (Addendum)
Physical today 09/19/16.  Mammogram ordered.  She will schedule.  cologuard negative 10/07/15.

## 2016-09-19 NOTE — Progress Notes (Signed)
Patient ID: Kara Griffin, female   DOB: 1936/06/13, 81 y.o.   MRN: [id number]   Subjective:    Patient ID: Kara Griffin, female    DOB: 12-27-1935, 81 y.o.   MRN: [id number]  HPI  Patient here for her physical exam.  She is followed by Dr Florene Glen for myelodysplastic syndrome with history of anemia.  On revlimid.  Stable.  Last seen in 08/23/16.  She is having some allergy symptoms.  Some cough.  Ran out of flonase.  Symptoms started.  Plans to restart.  No chest congestion.  No wheezing.  No sob.  No acid reflux.  No abdominal pain or cramping.  Bowels stable.  Sugars elevated.  More 200 range.  See attached list.  Discussed treatment options.     Past Medical History:  Diagnosis Date  . Anemia   . Diabetes mellitus ([place])   . Environmental allergies   . Hypercholesterolemia   . Hypertension   . Myelodysplastic syndrome ([place])    Sees Dr Jerrye Noble   Past Surgical History:  Procedure Laterality Date  . ABDOMINAL HYSTERECTOMY    . [place]  . DILATION AND CURETTAGE OF UTERUS    . TONSILECTOMY, ADENOIDECTOMY, BILATERAL MYRINGOTOMY AND TUBES  1963  . TOTAL ABDOMINAL HYSTERECTOMY W/ BILATERAL SALPINGOOPHORECTOMY  1985   secondary to abnormal cells and abnormal uterine bleeding   Family History  Problem Relation Age of Onset  . Heart disease Father     Deceased (MI) - 28  . Diabetes Mother     Deceased  . Thyroid cancer Mother   . Osteoarthritis Mother   . Asthma    . Diabetes Brother   . Heart disease Maternal Grandmother     myocardial infarction - 52  . Asthma Maternal Grandmother   . Heart disease Paternal Grandmother     myocardial infarction-52  . Heart disease Brother    Social History   Social History  . Marital status: Divorced    Spouse name: N/A  . Number of children: 3  . Years of education: N/A   Occupational History  . retired Games developer    Social History Main Topics  . Smoking status: Never Smoker  . Smokeless  tobacco: Never Used  . Alcohol use No  . Drug use: No  . Sexual activity: Not Asked   Other Topics Concern  . None   Social History Narrative  . None    Outpatient Encounter Prescriptions as of 09/19/2016  Medication Sig  . aspirin 81 MG tablet Take 81 mg by mouth daily.  . Calcium Carbonate-Vitamin D (CALCIUM 600+D) 600-400 MG-UNIT per tablet Take 1 tablet by mouth daily. Reported on 01/15/2016  . cyanocobalamin 500 MCG tablet Take 500 mcg by mouth daily.  . fish oil-omega-3 fatty acids 1000 MG capsule Take 1 g by mouth daily.   . fluticasone (FLONASE) 50 MCG/ACT nasal spray Place 2 sprays into both nostrils daily.  Marland Kitchen lenalidomide (REVLIMID) 10 MG capsule Take 10 mg by mouth daily.  Marland Kitchen losartan (COZAAR) 50 MG tablet TAKE 1 TABLET BY MOUTH EVERY DAY  . metFORMIN (GLUCOPHAGE) 1000 MG tablet TAKE 1 TABLET BY MOUTH TWICE DAILY  . Multiple Vitamin (MULTIVITAMIN) tablet Take 1 tablet by mouth daily.  Marland Kitchen nystatin cream (MYCOSTATIN) Apply 1 application topically 2 (two) times daily. (Patient taking differently: Apply 1 application topically 2 (two) times daily as needed. )  . ONE TOUCH LANCETS MISC Use to check sugars 1-2 times a  day Dx. E11.9 (One Touch Ultra Mini)  . ONE TOUCH ULTRA TEST test strip USE AS DIRECTED TO TEST BLOOD SUGAR TWICE DAILY  . [DISCONTINUED] azithromycin (ZITHROMAX) 250 MG tablet Tale 500 mg PO on day 1, then 250 mg PO q24h x 4 days.  . sitaGLIPtin (JANUVIA) 100 MG tablet Take 1 tablet (100 mg total) by mouth daily.   No facility-administered encounter medications on file as of 09/19/2016.     Review of Systems  Constitutional: Negative for appetite change and unexpected weight change.  HENT: Positive for postnasal drip. Negative for congestion and sinus pressure.   Eyes: Negative for pain and visual disturbance.  Respiratory: Negative for cough, chest tightness and shortness of breath.   Cardiovascular: Negative for chest pain and palpitations.       No increased  leg swelling.    Gastrointestinal: Negative for abdominal pain, diarrhea, nausea and vomiting.  Genitourinary: Negative for difficulty urinating and dysuria.  Musculoskeletal: Negative for back pain and joint swelling.  Skin: Negative for color change and rash.  Neurological: Negative for dizziness, light-headedness and headaches.  Hematological: Negative for adenopathy. Does not bruise/bleed easily.  Psychiatric/Behavioral: Negative for agitation and dysphoric mood.       Objective:     Blood pressure rechecked by me:  130/64  Physical Exam  Constitutional: She is oriented to person, place, and time. She appears well-developed and well-nourished. No distress.  HENT:  Nose: Nose normal.  Mouth/Throat: Oropharynx is clear and moist.  Eyes: Right eye exhibits no discharge. Left eye exhibits no discharge. No scleral icterus.  Neck: Neck supple. No thyromegaly present.  Cardiovascular: Normal rate and regular rhythm.   Pulmonary/Chest: Breath sounds normal. No accessory muscle usage. No tachypnea. No respiratory distress. She has no decreased breath sounds. She has no wheezes. She has no rhonchi. Right breast exhibits no inverted nipple, no mass, no nipple discharge and no tenderness (no axillary adenopathy). Left breast exhibits no inverted nipple, no mass, no nipple discharge and no tenderness (no axilarry adenopathy).  Abdominal: Soft. Bowel sounds are normal. There is no tenderness.  Musculoskeletal: She exhibits no tenderness.  No increased edema.   Lymphadenopathy:    She has no cervical adenopathy.  Neurological: She is alert and oriented to person, place, and time.  Skin: Skin is warm. No rash noted. No erythema.  Psychiatric: She has a normal mood and affect. Her behavior is normal.    BP 136/82   Pulse 84   Temp 98.2 F (36.8 C) (Oral)   Ht 5\' 1"  (1.549 m)   Wt 176 lb (79.8 kg)   LMP 08/13/1984   SpO2 96%   BMI 33.25 kg/m  Wt Readings from Last 3 Encounters:    09/19/16 176 lb (79.8 kg)  05/17/16 178 lb 6.4 oz (80.9 kg)  01/26/16 177 lb 3.2 oz (80.4 kg)     Lab Results  Component Value Date   WBC 4.2 06/10/2014   HGB 13.5 06/10/2014   HCT 40 06/10/2014   PLT 133 (A) 06/10/2014   GLUCOSE 172 (H) 01/15/2016   CHOL 160 10/05/2015   TRIG 215 (H) 10/05/2015   HDL 50 10/05/2015   LDLDIRECT 85.3 08/23/2013   LDLCALC 67 10/05/2015   ALT 23 10/05/2015   AST 17 10/05/2015   NA 139 01/15/2016   K 3.9 01/15/2016   CL 106 01/15/2016   CREATININE 0.64 01/15/2016   BUN 18 01/15/2016   CO2 24 01/15/2016   TSH 2.532 10/05/2015  HGBA1C 6.8 (H) 01/15/2016   MICROALBUR 1.0 01/16/2015    Dg Chest 2 View  Result Date: 01/26/2016 CLINICAL DATA:  Acute onset of congestion and cough. Initial encounter. EXAM: CHEST  2 VIEW COMPARISON:  CT of the chest performed 08/17/2015 FINDINGS: The lungs are hyperexpanded, with flattening of the hemidiaphragms, compatible with COPD. Mild apparent lingular opacity could reflect mild pneumonia. Underlying peribronchial thickening is noted. Mild right apical scarring is seen. No pleural effusion or pneumothorax is identified. The heart is normal in size; the mediastinal contour is within normal limits. No acute osseous abnormalities are seen. IMPRESSION: 1. Mild apparent lingular opacity could reflect mild pneumonia. 2. Findings of COPD, and mild right apical scarring. Electronically Signed   By: Garald Balding M.D.   On: 01/26/2016 18:25   Dg Lumbar Spine 2-3 Views  Result Date: 01/26/2016 CLINICAL DATA:  Chronic left lower back pain.  Initial encounter. EXAM: LUMBAR SPINE - 2-3 VIEW COMPARISON:  None. FINDINGS: There is no evidence of fracture or subluxation. Vertebral bodies demonstrate normal height and alignment. There is mild disc space narrowing at L4-L5. Mild facet disease is noted along the lower lumbar spine. The visualized bowel gas pattern is unremarkable in appearance; air and stool are noted within the colon.  The sacroiliac joints are within normal limits. IMPRESSION: No evidence of fracture or subluxation along the lumbar spine. Electronically Signed   By: Garald Balding M.D.   On: 01/26/2016 18:26   Dg Hip Unilat With Pelvis 2-3 Views Left  Result Date: 01/26/2016 CLINICAL DATA:  Chronic left hip pain.  Initial encounter. EXAM: DG HIP (WITH OR WITHOUT PELVIS) 2-3V LEFT COMPARISON:  None. FINDINGS: There is no evidence of fracture or dislocation. Both femoral heads are seated normally within their respective acetabula. The proximal left femur appears intact. No significant degenerative change is appreciated. The sacroiliac joints are unremarkable in appearance. The visualized bowel gas pattern is grossly unremarkable in appearance. Scattered phleboliths are noted within the pelvis. IMPRESSION: No evidence of fracture or dislocation. Electronically Signed   By: Garald Balding M.D.   On: 01/26/2016 18:27       Assessment & Plan:   Problem List Items Addressed This Visit    Diabetes mellitus ([place])    Low carb diet and exercise.  On metformin.  Sugars elevated.  See attached list.  Has low sugars with sulfonylurea.  Will hold on actos given h/o lower extremity swelling. She wants to avoid injections if possible.  Start [place] as directed.  Check and record blood sugars.  Get her back in soon to reassess.        Relevant Medications   sitaGLIPtin (JANUVIA) 100 MG tablet   Environmental allergies    Normally well controlled.  Some congestion, cough and drainage now.  Restart flonase.  Can use robitussin as directed.  Follow.        Health care maintenance    Physical today 09/19/16.  Mammogram ordered.  She will schedule.  cologuard negative 10/07/15.        Hypercholesterolemia    Low cholesterol diet and exercise.  Follow lipid panel.        Hypertension    Blood pressure under good control.  Continue same medication regimen.  Follow pressures.  Follow metabolic panel.        Myelodysplastic  syndrome ([place])    Followed by hematology.  Stable.  Sees Dr Florene Glen.  On revlimid.        Neuropathy ([place])  Stable.  Follow.       Obesity (BMI 30-39.9)    Diet and exercise.  Follow.        Relevant Medications   sitaGLIPtin (JANUVIA) 100 MG tablet   Osteopenia    09/23/14 - bone density - osteopenia.  Continue weight bearing exercise.        Thyroid nodule    Worked up by Dr Harlow Asa.  Follow tsh.        Other Visit Diagnoses    Encounter for screening mammogram for breast cancer    -  Primary   Relevant Orders   MM Digital Screening   Abnormal CXR       In reviewing, she had previous cxr with question of pneumonia.  needs f/u cxr.  pt notified.         Einar Pheasant, MD

## 2016-09-21 ENCOUNTER — Encounter: Payer: Self-pay | Admitting: Internal Medicine

## 2016-09-21 ENCOUNTER — Telehealth: Payer: Self-pay | Admitting: Internal Medicine

## 2016-09-21 NOTE — Assessment & Plan Note (Signed)
Low carb diet and exercise.  On metformin.  Sugars elevated.  See attached list.  Has low sugars with sulfonylurea.  Will hold on actos given h/o lower extremity swelling. She wants to avoid injections if possible.  Start [place] as directed.  Check and record blood sugars.  Get her back in soon to reassess.

## 2016-09-21 NOTE — Assessment & Plan Note (Signed)
Blood pressure under good control.  Continue same medication regimen.  Follow pressures.  Follow metabolic panel.   

## 2016-09-21 NOTE — Assessment & Plan Note (Signed)
Worked up by Dr Gerkin.  Follow tsh.  

## 2016-09-21 NOTE — Assessment & Plan Note (Signed)
Normally well controlled.  Some congestion, cough and drainage now.  Restart flonase.  Can use robitussin as directed.  Follow.

## 2016-09-21 NOTE — Assessment & Plan Note (Signed)
Stable.  Follow.   

## 2016-09-21 NOTE — Telephone Encounter (Signed)
My chart sent to pt to inform of need for f/u cxr.

## 2016-09-21 NOTE — Assessment & Plan Note (Signed)
Followed by hematology.  Stable.  Sees Dr Florene Glen.  On revlimid.

## 2016-09-21 NOTE — Assessment & Plan Note (Signed)
Low cholesterol diet and exercise.  Follow lipid panel.   

## 2016-09-21 NOTE — Assessment & Plan Note (Signed)
09/23/14 - bone density - osteopenia.  Continue weight bearing exercise.

## 2016-09-21 NOTE — Assessment & Plan Note (Signed)
Diet and exercise.  Follow.  

## 2016-10-07 ENCOUNTER — Encounter: Payer: Self-pay | Admitting: Internal Medicine

## 2016-10-16 ENCOUNTER — Encounter: Payer: Self-pay | Admitting: Internal Medicine

## 2016-10-17 ENCOUNTER — Ambulatory Visit: Admission: RE | Admit: 2016-10-17 | Payer: Medicare Other | Source: Ambulatory Visit

## 2016-10-17 ENCOUNTER — Ambulatory Visit
Admission: RE | Admit: 2016-10-17 | Discharge: 2016-10-17 | Disposition: A | Discharge status: Home / Self Care | Payer: Medicare Other | Source: Ambulatory Visit | Attending: Internal Medicine | Admitting: Internal Medicine

## 2016-10-17 DIAGNOSIS — Z1231 Encounter for screening mammogram for malignant neoplasm of breast: Secondary | ICD-10-CM | POA: Diagnosis not present

## 2016-10-17 LAB — HM MAMMOGRAPHY

## 2016-10-29 ENCOUNTER — Other Ambulatory Visit: Payer: Self-pay | Admitting: Internal Medicine

## 2016-11-05 ENCOUNTER — Other Ambulatory Visit: Payer: Self-pay | Admitting: Internal Medicine

## 2016-11-09 ENCOUNTER — Ambulatory Visit: Payer: Medicare Other | Admitting: Internal Medicine

## 2016-11-11 ENCOUNTER — Ambulatory Visit (INDEPENDENT_AMBULATORY_CARE_PROVIDER_SITE_OTHER): Payer: Medicare Other | Admitting: Internal Medicine

## 2016-11-11 ENCOUNTER — Encounter: Payer: Self-pay | Admitting: Internal Medicine

## 2016-11-11 VITALS — BP 122/64 | HR 83 | Temp 97.7°F | Resp 15 | Ht 61.0 in | Wt 180.4 lb

## 2016-11-11 DIAGNOSIS — I10 Essential (primary) hypertension: Secondary | ICD-10-CM

## 2016-11-11 DIAGNOSIS — E119 Type 2 diabetes mellitus without complications: Secondary | ICD-10-CM

## 2016-11-11 DIAGNOSIS — R42 Dizziness and giddiness: Secondary | ICD-10-CM | POA: Diagnosis not present

## 2016-11-11 DIAGNOSIS — D469 Myelodysplastic syndrome, unspecified: Secondary | ICD-10-CM | POA: Diagnosis not present

## 2016-11-11 DIAGNOSIS — E7800 Pure hypercholesterolemia, unspecified: Secondary | ICD-10-CM | POA: Diagnosis not present

## 2016-11-11 NOTE — Assessment & Plan Note (Signed)
Low cholesterol diet and exercise.  Follow lipid panel.   

## 2016-11-11 NOTE — Progress Notes (Signed)
Patient ID: Kara Griffin, female   DOB: 1936-01-09, 81 y.o.   MRN: [id number]   Subjective:    Patient ID: Kara Griffin, female    DOB: 1935-12-17, 81 y.o.   MRN: [id number]  HPI  Patient here for a scheduled follow up.  She reports that she has been having problems with intermittent dizziness..  Had an episode in 10/2016.  Woke.  Room spinning.  Some nausea.  Sat all day.  Dizziness eased off in the pm.  States this last episode started 5-6 days ago.  AM worse.  Has been sleeping in the recliner.  Unable to lie down.  Room spins.  Also worse if she closes her eyes.  If she bends over and then stands, room spins.  Left ear feels stopped up.  No sore throat.  No sinus pressure.  No chest pain.  No sob.  No headache.  No nausea or vomiting now.  Feels better right now.  Declines to lie down.  Avoiding bending.     Past Medical History:  Diagnosis Date  . Anemia   . Diabetes mellitus ([place])   . Environmental allergies   . Hypercholesterolemia   . Hypertension   . Myelodysplastic syndrome ([place])    Sees Dr Jerrye Noble   Past Surgical History:  Procedure Laterality Date  . ABDOMINAL HYSTERECTOMY    . [place]  . DILATION AND CURETTAGE OF UTERUS    . TONSILECTOMY, ADENOIDECTOMY, BILATERAL MYRINGOTOMY AND TUBES  1963  . TOTAL ABDOMINAL HYSTERECTOMY W/ BILATERAL SALPINGOOPHORECTOMY  1985   secondary to abnormal cells and abnormal uterine bleeding   Family History  Problem Relation Age of Onset  . Heart disease Father     Deceased (MI) - 52  . Diabetes Mother     Deceased  . Thyroid cancer Mother   . Osteoarthritis Mother   . Asthma    . Diabetes Brother   . Heart disease Maternal Grandmother     myocardial infarction - 38  . Asthma Maternal Grandmother   . Heart disease Paternal Grandmother     myocardial infarction-52  . Heart disease Brother   . Breast cancer Neg Hx    Social History   Social History  . Marital status: Divorced    Spouse  name: N/A  . Number of children: 3  . Years of education: N/A   Occupational History  . retired Games developer    Social History Main Topics  . Smoking status: Never Smoker  . Smokeless tobacco: Never Used  . Alcohol use No  . Drug use: No  . Sexual activity: Not Asked   Other Topics Concern  . None   Social History Narrative  . None    Outpatient Encounter Prescriptions as of 11/11/2016  Medication Sig  . aspirin 81 MG tablet Take 81 mg by mouth daily.  . Calcium Carbonate-Vitamin D (CALCIUM 600+D) 600-400 MG-UNIT per tablet Take 1 tablet by mouth daily. Reported on 01/15/2016  . cyanocobalamin 500 MCG tablet Take 1,000 mcg by mouth daily.   . fish oil-omega-3 fatty acids 1000 MG capsule Take 1 g by mouth daily.   . fluticasone (FLONASE) 50 MCG/ACT nasal spray Place 2 sprays into both nostrils daily.  Marland Kitchen lenalidomide (REVLIMID) 10 MG capsule Take 10 mg by mouth daily.  Marland Kitchen losartan (COZAAR) 50 MG tablet TAKE 1 TABLET BY MOUTH EVERY DAY  . losartan (COZAAR) 50 MG tablet TAKE 1 TABLET BY MOUTH EVERY DAY  .  metFORMIN (GLUCOPHAGE) 1000 MG tablet TAKE 1 TABLET BY MOUTH TWICE DAILY  . Multiple Vitamin (MULTIVITAMIN) tablet Take 1 tablet by mouth daily.  Marland Kitchen nystatin cream (MYCOSTATIN) Apply 1 application topically 2 (two) times daily. (Patient taking differently: Apply 1 application topically 2 (two) times daily as needed. )  . ONE TOUCH LANCETS MISC Use to check sugars 1-2 times a day Dx. E11.9 (One Touch Ultra Mini)  . ONE TOUCH ULTRA TEST test strip USE AS DIRECTED TO TEST BLOOD SUGAR TWICE DAILY  . sitaGLIPtin (JANUVIA) 100 MG tablet Take 1 tablet (100 mg total) by mouth daily.   No facility-administered encounter medications on file as of 11/11/2016.     Review of Systems  Constitutional: Negative for appetite change and unexpected weight change.  HENT: Negative for ear pain and sinus pressure.        Left ear fullness.    Respiratory: Negative for cough, chest tightness and shortness  of breath.   Cardiovascular: Negative for chest pain and palpitations.  Gastrointestinal: Negative for abdominal pain, diarrhea and vomiting.  Genitourinary: Negative for difficulty urinating and dysuria.  Musculoskeletal: Negative for back pain and joint swelling.  Skin: Negative for color change and rash.  Neurological: Positive for dizziness. Negative for headaches.  Psychiatric/Behavioral: Negative for agitation and dysphoric mood.       Objective:     Blood pressure rechecked by me:  122/64  Physical Exam  Constitutional: She appears well-developed and well-nourished. No distress.  HENT:  Nose: Nose normal.  Mouth/Throat: Oropharynx is clear and moist.  Cerumen - left ear.   Neck: Neck supple. No thyromegaly present.  Cardiovascular: Normal rate and regular rhythm.   Pulmonary/Chest: Breath sounds normal. No respiratory distress. She has no wheezes.  Abdominal: Soft. Bowel sounds are normal. There is no tenderness.  Musculoskeletal: She exhibits no tenderness.  No increased edema.  DP pulses palpable and equal bilaterally.    Lymphadenopathy:    She has no cervical adenopathy.  Neurological:  Minimal reproducible symptoms with looking down and raising head.  Declined to lie down.   Skin: No rash noted. No erythema.  Psychiatric: She has a normal mood and affect. Her behavior is normal.    BP 122/64 (BP Location: Left Arm, Patient Position: Sitting, Cuff Size: Large)   Pulse 83   Temp 97.7 F (36.5 C) (Oral)   Resp 15   Ht 5\' 1"  (1.549 m)   Wt 180 lb 6.4 oz (81.8 kg)   LMP 08/13/1984   SpO2 96%   BMI 34.09 kg/m  Wt Readings from Last 3 Encounters:  11/11/16 180 lb 6.4 oz (81.8 kg)  09/19/16 176 lb (79.8 kg)  05/17/16 178 lb 6.4 oz (80.9 kg)     Lab Results  Component Value Date   WBC 4.2 06/10/2014   HGB 13.5 06/10/2014   HCT 40 06/10/2014   PLT 133 (A) 06/10/2014   GLUCOSE 172 (H) 01/15/2016   CHOL 160 10/05/2015   TRIG 215 (H) 10/05/2015   HDL 50  10/05/2015   LDLDIRECT 85.3 08/23/2013   LDLCALC 67 10/05/2015   ALT 23 10/05/2015   AST 17 10/05/2015   NA 139 01/15/2016   K 3.9 01/15/2016   CL 106 01/15/2016   CREATININE 0.64 01/15/2016   BUN 18 01/15/2016   CO2 24 01/15/2016   TSH 2.532 10/05/2015   HGBA1C 6.8 (H) 01/15/2016   MICROALBUR 1.0 01/16/2015    Mm Digital Screening  Result Date: 10/17/2016 CLINICAL DATA:  Screening. EXAM: DIGITAL SCREENING BILATERAL MAMMOGRAM WITH CAD COMPARISON:  Previous exam(s). ACR Breast Density Category b: There are scattered areas of fibroglandular density. FINDINGS: There are no findings suspicious for malignancy. Images were processed with CAD. IMPRESSION: No mammographic evidence of malignancy. A result letter of this screening mammogram will be mailed directly to the patient. RECOMMENDATION: Screening mammogram in one year. (Code:SM-B-01Y) BI-RADS CATEGORY  1: Negative. Electronically Signed   By: Lajean Manes M.D.   On: 10/17/2016 14:10       Assessment & Plan:   Problem List Items Addressed This Visit    Diabetes mellitus ([place])    Low carb diet and exercise.  Taking [place] and metformin.  Sugars stable - mostly ranging 160-190.  Hold on making changes.  Follow.        Dizziness - Primary    Dizziness as outlined.  Appears to be c/w an inner ear etiology.   She declined to lie down on the table.  Blood pressure looks good.  Refer to ENT for evaluation and further treatment.        Relevant Orders   Ambulatory referral to ENT   Hypercholesterolemia    Low cholesterol diet and exercise.  Follow lipid panel.        Hypertension    Blood pressure under good control.  Continue same medication regimen.  Follow pressures.  Follow metabolic panel.        Myelodysplastic syndrome ([place])    Followed by hematology.  Sees Dr Florene Glen.  Has f/u scheduled for 11/22/16.           Einar Pheasant, MD

## 2016-11-11 NOTE — Assessment & Plan Note (Signed)
Blood pressure under good control.  Continue same medication regimen.  Follow pressures.  Follow metabolic panel.   

## 2016-11-11 NOTE — Progress Notes (Signed)
Pre visit review using our clinic review tool, if applicable. No additional management support is needed unless otherwise documented below in the visit note. 

## 2016-11-11 NOTE — Assessment & Plan Note (Signed)
Dizziness as outlined.  Appears to be c/w an inner ear etiology.   She declined to lie down on the table.  Blood pressure looks good.  Refer to ENT for evaluation and further treatment.

## 2016-11-11 NOTE — Assessment & Plan Note (Signed)
Low carb diet and exercise.  Taking [place] and metformin.  Sugars stable - mostly ranging 160-190.  Hold on making changes.  Follow.

## 2016-11-11 NOTE — Assessment & Plan Note (Signed)
Followed by hematology.  Sees Dr Florene Glen.  Has f/u scheduled for 11/22/16.

## 2016-11-14 ENCOUNTER — Other Ambulatory Visit: Payer: Self-pay | Admitting: Internal Medicine

## 2016-12-04 ENCOUNTER — Other Ambulatory Visit: Payer: Self-pay | Admitting: Internal Medicine

## 2016-12-13 ENCOUNTER — Other Ambulatory Visit: Payer: Self-pay | Admitting: Internal Medicine

## 2017-01-07 ENCOUNTER — Other Ambulatory Visit: Payer: Self-pay | Admitting: Internal Medicine

## 2017-01-10 ENCOUNTER — Ambulatory Visit (INDEPENDENT_AMBULATORY_CARE_PROVIDER_SITE_OTHER): Payer: Medicare Other

## 2017-01-10 VITALS — BP 118/70 | HR 78 | Temp 98.3°F | Resp 12 | Ht 61.0 in | Wt 181.0 lb

## 2017-01-10 DIAGNOSIS — Z0000 Encounter for general adult medical examination without abnormal findings: Secondary | ICD-10-CM

## 2017-01-10 NOTE — Patient Instructions (Addendum)
Kara Griffin , Thank you for taking time to come for your Medicare Wellness Visit. I appreciate your ongoing commitment to your health goals. Please review the following plan we discussed and let me know if I can assist you in the future.   Follow up with Dr. Nicki Reaper as needed.    Bring a copy of your [place] and/or Living Will to be scanned into chart.  Have a great day!  These are the goals we discussed: Goals    . Increase physical activity          Bed/chair/standing exercises, daily       This is a list of the screening recommended for you and due dates:  Health Maintenance  Topic Date Due  . Tetanus Vaccine  09/29/1956  . Pneumonia vaccines (1 of 2 - PCV13) 09/29/2002  . Complete foot exam   08/14/2014  . Hemoglobin A1C  07/17/2016  . Flu Shot  04/05/2017  . Eye exam for diabetics  04/15/2017  . Mammogram  10/17/2017  . DEXA scan (bone density measurement)  Completed    Fall Prevention in the Home Falls can cause injuries. They can happen to people of all ages. There are many things you can do to make your home safe and to help prevent falls. What can I do on the outside of my home?  Regularly fix the edges of walkways and driveways and fix any cracks.  Remove anything that might make you trip as you walk through a door, such as a raised step or threshold.  Trim any bushes or trees on the path to your home.  Use bright outdoor lighting.  Clear any walking paths of anything that might make someone trip, such as rocks or tools.  Regularly check to see if handrails are loose or broken. Make sure that both sides of any steps have handrails.  Any raised decks and porches should have guardrails on the edges.  Have any leaves, snow, or ice cleared regularly.  Use sand or salt on walking paths during winter.  Clean up any spills in your garage right away. This includes oil or grease spills. What can I do in the bathroom?  Use night  lights.  Install grab bars by the toilet and in the tub and shower. Do not use towel bars as grab bars.  Use non-skid mats or decals in the tub or shower.  If you need to sit down in the shower, use a plastic, non-slip stool.  Keep the floor dry. Clean up any water that spills on the floor as soon as it happens.  Remove soap buildup in the tub or shower regularly.  Attach bath mats securely with double-sided non-slip rug tape.  Do not have throw rugs and other things on the floor that can make you trip. What can I do in the bedroom?  Use night lights.  Make sure that you have a light by your bed that is easy to reach.  Do not use any sheets or blankets that are too big for your bed. They should not hang down onto the floor.  Have a firm chair that has side arms. You can use this for support while you get dressed.  Do not have throw rugs and other things on the floor that can make you trip. What can I do in the kitchen?  Clean up any spills right away.  Avoid walking on wet floors.  Keep items that you use a  lot in easy-to-reach places.  If you need to reach something above you, use a strong step stool that has a grab bar.  Keep electrical cords out of the way.  Do not use floor polish or wax that makes floors slippery. If you must use wax, use non-skid floor wax.  Do not have throw rugs and other things on the floor that can make you trip. What can I do with my stairs?  Do not leave any items on the stairs.  Make sure that there are handrails on both sides of the stairs and use them. Fix handrails that are broken or loose. Make sure that handrails are as long as the stairways.  Check any carpeting to make sure that it is firmly attached to the stairs. Fix any carpet that is loose or worn.  Avoid having throw rugs at the top or bottom of the stairs. If you do have throw rugs, attach them to the floor with carpet tape.  Make sure that you have a light switch at the  top of the stairs and the bottom of the stairs. If you do not have them, ask someone to add them for you. What else can I do to help prevent falls?  Wear shoes that:  Do not have high heels.  Have rubber bottoms.  Are comfortable and fit you well.  Are closed at the toe. Do not wear sandals.  If you use a stepladder:  Make sure that it is fully opened. Do not climb a closed stepladder.  Make sure that both sides of the stepladder are locked into place.  Ask someone to hold it for you, if possible.  Clearly mark and make sure that you can see:  Any grab bars or handrails.  First and last steps.  Where the edge of each step is.  Use tools that help you move around (mobility aids) if they are needed. These include:  Canes.  Walkers.  Scooters.  Crutches.  Turn on the lights when you go into a dark area. Replace any light bulbs as soon as they burn out.  Set up your furniture so you have a clear path. Avoid moving your furniture around.  If any of your floors are uneven, fix them.  If there are any pets around you, be aware of where they are.  Review your medicines with your doctor. Some medicines can make you feel dizzy. This can increase your chance of falling. Ask your doctor what other things that you can do to help prevent falls. This information is not intended to replace advice given to you by your health care provider. Make sure you discuss any questions you have with your health care provider. Document Released: 06/18/2009 Document Revised: 01/28/2016 Document Reviewed: 09/26/2014 Elsevier Interactive Patient Education  2017 [employer].

## 2017-01-10 NOTE — Progress Notes (Signed)
Subjective:   Kara Griffin is a 81 y.o. female who presents for an Initial Medicare Annual Wellness Visit.  Review of Systems    No ROS.  Medicare Wellness Visit.  Cardiac Risk Factors include: advanced age (>34men, >55 women);hypertension;diabetes mellitus;obesity (BMI >30kg/m2)     Objective:    Today's Vitals   01/10/17 1413  BP: 118/70  Pulse: 78  Resp: 12  Temp: 98.3 F (36.8 C)  TempSrc: Oral  SpO2: 97%  Weight: 181 lb (82.1 kg)  Height: 5\' 1"  (1.549 m)   Body mass index is 34.2 kg/m.   Current Medications (verified) Outpatient Encounter Prescriptions as of 01/10/2017  Medication Sig  . aspirin 81 MG tablet Take 81 mg by mouth daily.  . Calcium Carbonate-Vitamin D (CALCIUM 600+D) 600-400 MG-UNIT per tablet Take 1 tablet by mouth daily. Reported on 01/15/2016  . cyanocobalamin 500 MCG tablet Take 1,000 mcg by mouth daily.   . fish oil-omega-3 fatty acids 1000 MG capsule Take 1 g by mouth daily.   . fluticasone (FLONASE) 50 MCG/ACT nasal spray Place 2 sprays into both nostrils daily.  Marland Kitchen JANUVIA 100 MG tablet TAKE 1 TABLET(100 MG) BY MOUTH DAILY  . lenalidomide (REVLIMID) 10 MG capsule Take 10 mg by mouth daily.  Marland Kitchen losartan (COZAAR) 50 MG tablet TAKE 1 TABLET BY MOUTH EVERY DAY  . metFORMIN (GLUCOPHAGE) 1000 MG tablet TAKE 1 TABLET BY MOUTH TWICE DAILY  . Multiple Vitamin (MULTIVITAMIN) tablet Take 1 tablet by mouth daily.  Marland Kitchen nystatin cream (MYCOSTATIN) Apply 1 application topically 2 (two) times daily. (Patient taking differently: Apply 1 application topically 2 (two) times daily as needed. )  . ONE TOUCH LANCETS MISC Use to check sugars 1-2 times a day Dx. E11.9 (One Touch Ultra Mini)  . ONE TOUCH ULTRA TEST test strip USE 1 STRIP AS DIRECTED TO TEST BLOOD SUGAR TWICE DAILY   No facility-administered encounter medications on file as of 01/10/2017.     Allergies (verified) Patient has no known allergies.   History: Past Medical History:  Diagnosis Date   . Anemia   . Diabetes mellitus ([place])   . Environmental allergies   . Hypercholesterolemia   . Hypertension   . Myelodysplastic syndrome ([place])    Sees Dr Jerrye Noble   Past Surgical History:  Procedure Laterality Date  . ABDOMINAL HYSTERECTOMY    . [place]  . DILATION AND CURETTAGE OF UTERUS    . TONSILECTOMY, ADENOIDECTOMY, BILATERAL MYRINGOTOMY AND TUBES  1963  . TOTAL ABDOMINAL HYSTERECTOMY W/ BILATERAL SALPINGOOPHORECTOMY  1985   secondary to abnormal cells and abnormal uterine bleeding   Family History  Problem Relation Age of Onset  . Heart disease Father     Deceased (MI) - 25  . Diabetes Mother     Deceased  . Thyroid cancer Mother   . Osteoarthritis Mother   . Asthma    . Diabetes Brother   . Heart disease Maternal Grandmother     myocardial infarction - 73  . Asthma Maternal Grandmother   . Heart disease Paternal Grandmother     myocardial infarction-52  . Heart disease Brother   . Sinusitis Brother   . Depression Son   . Diabetes Son   . Depression Son   . Diabetes Son   . Depression Son   . Breast cancer Neg Hx    Social History   Occupational History  . retired Games developer    Social History Main Topics  .  Smoking status: Never Smoker  . Smokeless tobacco: Never Used  . Alcohol use No  . Drug use: No  . Sexual activity: No    Tobacco Counseling Counseling given: Not Answered   Activities of Daily Living In your present state of health, do you have any difficulty performing the following activities: 01/10/2017  Hearing? Y  Vision? N  Difficulty concentrating or making decisions? N  Walking or climbing stairs? Y  Dressing or bathing? N  Doing errands, shopping? N  Preparing Food and eating ? N  Using the Toilet? N  In the past six months, have you accidently leaked urine? N  Do you have problems with loss of bowel control? N  Managing your Medications? N  Managing your Finances? N  Housekeeping or managing your  Housekeeping? N  Some recent data might be hidden    Immunizations and Health Maintenance Immunization History  Administered Date(s) Administered  . Influenza Split 06/04/2012, 06/12/2013  . Influenza, High Dose Seasonal PF 05/17/2016  . Influenza,inj,Quad PF,36+ Mos 06/02/2014, 05/22/2015   Health Maintenance Due  Topic Date Due  . TETANUS/TDAP  09/29/1956  . PNA vac Low Risk Adult (1 of 2 - PCV13) 09/29/2002  . FOOT EXAM  08/14/2014  . HEMOGLOBIN A1C  07/17/2016    Patient Care Team: Einar Pheasant, MD as PCP - General (Internal Medicine)  Indicate any recent Medical Services you may have received from other than [facility] providers in the past year (date may be approximate).     Assessment:   This is a routine wellness examination for [place]. The goal of the wellness visit is to assist the patient how to close the gaps in care and create a preventative care plan for the patient.   Taking calcium VIT D as appropriate/Osteoporosis risk reviewed.  Medications reviewed; taking without issues or barriers.  Safety issues reviewed; smoke detectors in the home. No firearms in the home.  Wears seatbelts when driving or riding with others. Patient does wear sunscreen or protective clothing when in direct sunlight. No violence in the home.  Depression- PHQ 2 &9 complete.  No signs/symptoms or verbal communication regarding little pleasure in doing things, feeling down, depressed or hopeless. No changes in sleeping, energy, eating, concentrating.  No thoughts of self harm or harm towards others.  Time spent on this topic is 8 minutes.   Patient is alert, normal appearance, oriented to person/place/and time. Correctly identified the president of the [place], recall of 3/3 words, and performing simple calculations.  Patient displays appropriate judgement and can read correct time from watch face.  No new identified risk were noted.  No failures at ADL's or IADL's.   BMI- discussed the  importance of a healthy diet, water intake and exercise. Educational material provided.   Daily fluid intake: 2 cups of caffeine, 6 cups of water  HTN- followed by PCP.  Dental- every six months.  Sleep patterns- Sleeps 6-7 hours at night.  Wakes feeling rested.  Prevnar 13 and TDAP vaccine deferred per patient preference.  Follow up with insurance.  Educational material provided.  Patient Concerns: None at this time. Follow up with PCP as needed.  Hearing/Vision screen Hearing Screening Comments: Followed by  [facility] Hearing aid, bilateral Vision Screening Comments: Followed by [facility] Wears corrective lenses when reading Last OV 2017 Cataract extraction, bilateral Visual acuity not assessed per patient preference since they have regular follow up with the ophthalmologist  Dietary issues and exercise activities discussed:  Current Exercise Habits: The patient does not participate in regular exercise at present  Goals    . Increase physical activity          Bed/chair/standing exercises, daily      Depression Screen PHQ 2/9 Scores 01/10/2017 05/17/2016 09/16/2015 09/12/2014 01/30/2014 12/02/2012 08/18/2012  PHQ - 2 Score 0 0 0 0 0 0 0  PHQ- 9 Score 0 - - - - - -    Fall Risk Fall Risk  01/10/2017 05/17/2016 09/16/2015 09/12/2014 01/30/2014  Falls in the past year? Yes No No No No  Number falls in past yr: 1 - - - -  Injury with Fall? No - - - -  Follow up Falls prevention discussed - - - -    Cognitive Function: MMSE - Mini Mental State Exam 01/10/2017  Orientation to time 5  Orientation to Place 5  Registration 3  Attention/ Calculation 5  Recall 3  Language- name 2 objects 2  Language- repeat 1  Language- follow 3 step command 3  Language- read & follow direction 1  Write a sentence 1  Copy design 1  Total score 30        Screening Tests Health Maintenance  Topic Date Due  . TETANUS/TDAP  09/29/1956  . PNA vac Low Risk Adult (1 of 2 -  PCV13) 09/29/2002  . FOOT EXAM  08/14/2014  . HEMOGLOBIN A1C  07/17/2016  . INFLUENZA VACCINE  04/05/2017  . OPHTHALMOLOGY EXAM  04/15/2017  . MAMMOGRAM  10/17/2017  . DEXA SCAN  Completed      Plan:    End of life planning; Advance aging; Advanced directives discussed. Copy of current HCPOA/Living Will requested.    I have personally reviewed and noted the following in the patient's chart:   . Medical and social history . Use of alcohol, tobacco or illicit drugs  . Current medications and supplements . Functional ability and status . Nutritional status . Physical activity . Advanced directives . List of other physicians . Hospitalizations, surgeries, and ER visits in previous 12 months . Vitals . Screenings to include cognitive, depression, and falls . Referrals and appointments  In addition, I have reviewed and discussed with patient certain preventive protocols, quality metrics, and best practice recommendations. A written personalized care plan for preventive services as well as general preventive health recommendations were provided to patient.     Varney Biles, LPN   03/12/9380    Reviewed above information.  Agree with plan.  Dr Nicki Reaper

## 2017-01-11 ENCOUNTER — Other Ambulatory Visit: Payer: Self-pay | Admitting: Internal Medicine

## 2017-02-10 ENCOUNTER — Other Ambulatory Visit: Payer: Self-pay | Admitting: Internal Medicine

## 2017-02-13 ENCOUNTER — Encounter: Payer: Self-pay | Admitting: Internal Medicine

## 2017-02-13 ENCOUNTER — Ambulatory Visit (INDEPENDENT_AMBULATORY_CARE_PROVIDER_SITE_OTHER): Payer: Medicare Other | Admitting: Internal Medicine

## 2017-02-13 VITALS — BP 138/70 | HR 82 | Temp 97.6°F | Resp 12 | Ht 61.0 in | Wt 184.4 lb

## 2017-02-13 DIAGNOSIS — E119 Type 2 diabetes mellitus without complications: Secondary | ICD-10-CM

## 2017-02-13 DIAGNOSIS — E559 Vitamin D deficiency, unspecified: Secondary | ICD-10-CM | POA: Diagnosis not present

## 2017-02-13 DIAGNOSIS — E041 Nontoxic single thyroid nodule: Secondary | ICD-10-CM

## 2017-02-13 DIAGNOSIS — R42 Dizziness and giddiness: Secondary | ICD-10-CM

## 2017-02-13 DIAGNOSIS — E669 Obesity, unspecified: Secondary | ICD-10-CM | POA: Diagnosis not present

## 2017-02-13 DIAGNOSIS — E7800 Pure hypercholesterolemia, unspecified: Secondary | ICD-10-CM | POA: Diagnosis not present

## 2017-02-13 DIAGNOSIS — I10 Essential (primary) hypertension: Secondary | ICD-10-CM | POA: Diagnosis not present

## 2017-02-13 DIAGNOSIS — G629 Polyneuropathy, unspecified: Secondary | ICD-10-CM

## 2017-02-13 DIAGNOSIS — D469 Myelodysplastic syndrome, unspecified: Secondary | ICD-10-CM

## 2017-02-13 DIAGNOSIS — W19XXXA Unspecified fall, initial encounter: Secondary | ICD-10-CM

## 2017-02-13 LAB — HM DIABETES FOOT EXAM

## 2017-02-13 NOTE — Progress Notes (Signed)
Pre-visit discussion using our clinic review tool. No additional management support is needed unless otherwise documented below in the visit note.  

## 2017-02-13 NOTE — Progress Notes (Signed)
Patient ID: Kara Griffin, female   DOB: 1936-06-04, 81 y.o.   MRN: [id number]   Subjective:    Patient ID: Kara Griffin, female    DOB: 06/19/1936, 81 y.o.   MRN: [id number]  HPI  Patient here for a scheduled follow up.  Had an episode of dizziness one week ago.  States she woke around 4:00 am.  Stood up fast.  Was dizzy.  Took a few steps and fell.  Fell on her right side.  Initially states she may have hit her head against an object, but then states she is not sure if hit head.  Never had any bruising.  Denies headache.  She did bruise her right arm and ribs are tender.  Some bruising on her right lower abdomen.  No hip pain.  Some back pain.  States has been sleeping in recliner -to help her back.  Dizziness is better.  No headache.  No chest pain or sob.  Back and rib pain better.  No abdominal pain.  Bowels better.  No blood in urine or stool.  No hip pain.  Sugars averaging 150-170 in the am and 150-190 in the pm.  Due f/u with Dr Florene Glen tomorrow.     Past Medical History:  Diagnosis Date  . Anemia   . Diabetes mellitus ([place])   . Environmental allergies   . Hypercholesterolemia   . Hypertension   . Myelodysplastic syndrome ([place])    Sees Dr Jerrye Noble   Past Surgical History:  Procedure Laterality Date  . ABDOMINAL HYSTERECTOMY    . [place]  . DILATION AND CURETTAGE OF UTERUS    . TONSILECTOMY, ADENOIDECTOMY, BILATERAL MYRINGOTOMY AND TUBES  1963  . TOTAL ABDOMINAL HYSTERECTOMY W/ BILATERAL SALPINGOOPHORECTOMY  1985   secondary to abnormal cells and abnormal uterine bleeding   Family History  Problem Relation Age of Onset  . Heart disease Father        Deceased (MI) - 38  . Diabetes Mother        Deceased  . Thyroid cancer Mother   . Osteoarthritis Mother   . Asthma Unknown   . Diabetes Brother   . Heart disease Maternal Grandmother        myocardial infarction - 73  . Asthma Maternal Grandmother   . Heart disease Paternal Grandmother         myocardial infarction-52  . Heart disease Brother   . Sinusitis Brother   . Depression Son   . Diabetes Son   . Depression Son   . Diabetes Son   . Depression Son   . Breast cancer Neg Hx    Social History   Social History  . Marital status: Divorced    Spouse name: N/A  . Number of children: 3  . Years of education: N/A   Occupational History  . retired Games developer    Social History Main Topics  . Smoking status: Never Smoker  . Smokeless tobacco: Never Used  . Alcohol use No  . Drug use: No  . Sexual activity: No   Other Topics Concern  . None   Social History Narrative  . None    Outpatient Encounter Prescriptions as of 02/13/2017  Medication Sig  . aspirin 81 MG tablet Take 81 mg by mouth daily.  . Calcium Carbonate-Vitamin D (CALCIUM 600+D) 600-400 MG-UNIT per tablet Take 1 tablet by mouth daily. Reported on 01/15/2016  . cyanocobalamin 500 MCG tablet Take 1,000 mcg by mouth  daily.   . fish oil-omega-3 fatty acids 1000 MG capsule Take 1 g by mouth daily.   . fluticasone (FLONASE) 50 MCG/ACT nasal spray Place 2 sprays into both nostrils daily.  Marland Kitchen JANUVIA 100 MG tablet TAKE 1 TABLET(100 MG) BY MOUTH DAILY  . lenalidomide (REVLIMID) 10 MG capsule Take 10 mg by mouth daily.  Marland Kitchen losartan (COZAAR) 50 MG tablet TAKE 1 TABLET BY MOUTH EVERY DAY  . metFORMIN (GLUCOPHAGE) 1000 MG tablet TAKE 1 TABLET BY MOUTH TWICE DAILY  . Multiple Vitamin (MULTIVITAMIN) tablet Take 1 tablet by mouth daily.  Marland Kitchen nystatin cream (MYCOSTATIN) Apply 1 application topically 2 (two) times daily. (Patient taking differently: Apply 1 application topically 2 (two) times daily as needed. )  . ONE TOUCH LANCETS MISC Use to check sugars 1-2 times a day Dx. E11.9 (One Touch Ultra Mini)  . ONE TOUCH ULTRA TEST test strip USE 1 STRIP AS DIRECTED TO TEST BLOOD SUGAR TWICE DAILY   No facility-administered encounter medications on file as of 02/13/2017.     Review of Systems  Constitutional: Negative  for appetite change and unexpected weight change.  HENT: Negative for congestion and sinus pressure.   Respiratory: Negative for cough, chest tightness and shortness of breath.   Cardiovascular: Negative for chest pain and palpitations.       Has noticed some pedal and ankle edema.    Gastrointestinal: Negative for abdominal pain, diarrhea, nausea and vomiting.  Genitourinary: Negative for difficulty urinating and dysuria.  Musculoskeletal: Positive for back pain. Negative for myalgias.  Skin: Negative for color change and rash.  Neurological: Positive for dizziness. Negative for headaches.  Psychiatric/Behavioral: Negative for agitation and dysphoric mood.       Objective:     Blood pressure rechecked by me:  138/70  Physical Exam  Constitutional: She appears well-developed and well-nourished. No distress.  HENT:  Nose: Nose normal.  Mouth/Throat: Oropharynx is clear and moist.  Neck: Neck supple. No thyromegaly present.  Cardiovascular: Normal rate and regular rhythm.   Pulmonary/Chest: Breath sounds normal. No respiratory distress. She has no wheezes.  Abdominal: Soft. Bowel sounds are normal. There is no tenderness.  Musculoskeletal: She exhibits no tenderness.  Pedal and ankle edema - bilateral.  Minimal pain to palpation over her mid to upper back.  No pain with rotations and abduction/adduction of her right hip.  Foot exam - some decreased sensation to pin prick up to ankle - bilateral.    Lymphadenopathy:    She has no cervical adenopathy.  Skin: No rash noted. No erythema.  Bruising - right arm and right lower abdomen.    Psychiatric: She has a normal mood and affect. Her behavior is normal.    BP 138/70   Pulse 82   Temp 97.6 F (36.4 C) (Oral)   Resp 12   Ht 5' 1"  (3.009 m)   Wt 184 lb 6.4 oz (83.6 kg)   LMP 08/13/1984   SpO2 97%   BMI 34.84 kg/m  Wt Readings from Last 3 Encounters:  02/13/17 184 lb 6.4 oz (83.6 kg)  01/10/17 181 lb (82.1 kg)  11/11/16 180  lb 6.4 oz (81.8 kg)     Lab Results  Component Value Date   WBC 4.2 06/10/2014   HGB 13.5 06/10/2014   HCT 40 06/10/2014   PLT 133 (A) 06/10/2014   GLUCOSE 172 (H) 01/15/2016   CHOL 160 10/05/2015   TRIG 215 (H) 10/05/2015   HDL 50 10/05/2015   LDLDIRECT 85.3  08/23/2013   LDLCALC 67 10/05/2015   ALT 23 10/05/2015   AST 17 10/05/2015   NA 139 01/15/2016   K 3.9 01/15/2016   CL 106 01/15/2016   CREATININE 0.64 01/15/2016   BUN 18 01/15/2016   CO2 24 01/15/2016   TSH 2.532 10/05/2015   HGBA1C 6.8 (H) 01/15/2016   MICROALBUR 1.0 01/16/2015    Mm Digital Screening  Result Date: 10/17/2016 CLINICAL DATA:  Screening. EXAM: DIGITAL SCREENING BILATERAL MAMMOGRAM WITH CAD COMPARISON:  Previous exam(s). ACR Breast Density Category b: There are scattered areas of fibroglandular density. FINDINGS: There are no findings suspicious for malignancy. Images were processed with CAD. IMPRESSION: No mammographic evidence of malignancy. A result letter of this screening mammogram will be mailed directly to the patient. RECOMMENDATION: Screening mammogram in one year. (Code:SM-B-01Y) BI-RADS CATEGORY  1: Negative. Electronically Signed   By: Lajean Manes M.D.   On: 10/17/2016 14:10       Assessment & Plan:   Problem List Items Addressed This Visit    Diabetes mellitus ([place])    Sugars as outlined.  Low carb diet and exercise.  Follow met b and a1c.        Relevant Orders   Lipid panel   Hemoglobin A1c   Dizziness    Dizziness as outlined.  Has had issues previously.  Saw ENT.  S/p epley maneuvers.  Improved.  Had reoccurrence last week.  Is better.  Declines any further evaluation, epley maneuvers or rehab.  Declines further w/up for recent fall.  Discussed head scan.  She declines.  Wants to follow.        Fall    Recent fall.  Unclear if hit head.  See note.  Discussed head scan.  She declines.  Back is getting better.  No hip or arm pain.  Ribs better.  No pain with deep breathing.   Declines any further testing or evaluation.  Follow.  Notify me if any change.        Hypercholesterolemia    Low cholesterol diet and exercise.  Follow lipid panel.        Hypertension    Blood pressure has been controlled.  Recheck as outlined.  Follow pressures.  Follow metabolic panel.  Same medication regimen.        Myelodysplastic syndrome ([place])    Followed by hematology.  Sees Dr Florene Glen.  Stable. Has f/u tomorrow.        Neuropathy    Stable.  Follow.        Obesity (BMI 30-39.9)    Diet and exercise.  Follow.        Thyroid nodule    Worked up by Dr Harlow Asa.  Follow tsh.       Relevant Orders   TSH    Other Visit Diagnoses    Vitamin D deficiency    -  Primary   Relevant Orders   VITAMIN D 25 Hydroxy (Vit-D Deficiency, Fractures)       Einar Pheasant, MD

## 2017-02-14 ENCOUNTER — Encounter: Payer: Self-pay | Admitting: Internal Medicine

## 2017-02-14 DIAGNOSIS — W19XXXA Unspecified fall, initial encounter: Secondary | ICD-10-CM | POA: Insufficient documentation

## 2017-02-14 NOTE — Assessment & Plan Note (Signed)
Recent fall.  Unclear if hit head.  See note.  Discussed head scan.  She declines.  Back is getting better.  No hip or arm pain.  Ribs better.  No pain with deep breathing.  Declines any further testing or evaluation.  Follow.  Notify me if any change.

## 2017-02-14 NOTE — Assessment & Plan Note (Signed)
Followed by hematology.  Sees Dr Florene Glen.  Stable. Has f/u tomorrow.

## 2017-02-14 NOTE — Assessment & Plan Note (Signed)
Diet and exercise.  Follow.  

## 2017-02-14 NOTE — Assessment & Plan Note (Signed)
Sugars as outlined.  Low carb diet and exercise.  Follow met b and a1c.

## 2017-02-14 NOTE — Assessment & Plan Note (Signed)
Worked up by Dr Gerkin.  Follow tsh.  

## 2017-02-14 NOTE — Assessment & Plan Note (Signed)
Blood pressure has been controlled.  Recheck as outlined.  Follow pressures.  Follow metabolic panel.  Same medication regimen.

## 2017-02-14 NOTE — Assessment & Plan Note (Signed)
Stable.  Follow.   

## 2017-02-14 NOTE — Assessment & Plan Note (Signed)
Low cholesterol diet and exercise.  Follow lipid panel.   

## 2017-02-14 NOTE — Assessment & Plan Note (Signed)
Dizziness as outlined.  Has had issues previously.  Saw ENT.  S/p epley maneuvers.  Improved.  Had reoccurrence last week.  Is better.  Declines any further evaluation, epley maneuvers or rehab.  Declines further w/up for recent fall.  Discussed head scan.  She declines.  Wants to follow.

## 2017-02-20 ENCOUNTER — Other Ambulatory Visit
Admission: RE | Admit: 2017-02-20 | Discharge: 2017-02-20 | Disposition: A | Discharge status: Home / Self Care | Payer: Medicare Other | Source: Ambulatory Visit | Attending: Internal Medicine | Admitting: Internal Medicine

## 2017-02-20 DIAGNOSIS — E559 Vitamin D deficiency, unspecified: Secondary | ICD-10-CM | POA: Diagnosis present

## 2017-02-20 DIAGNOSIS — E041 Nontoxic single thyroid nodule: Secondary | ICD-10-CM

## 2017-02-20 DIAGNOSIS — E119 Type 2 diabetes mellitus without complications: Secondary | ICD-10-CM | POA: Diagnosis present

## 2017-02-20 LAB — LIPID PANEL
Cholesterol: 141 mg/dL (ref 0–200)
HDL: 44 mg/dL (ref >40)
LDL CALC: 52 mg/dL (ref 0–99)
TRIGLYCERIDES: 227 mg/dL — AB (ref <150)
Total CHOL/HDL Ratio: 3.2 RATIO
VLDL: 45 mg/dL — AB (ref 0–40)

## 2017-02-20 LAB — TSH: TSH: 1.586 u[IU]/mL (ref 0.350–4.500)

## 2017-02-21 LAB — HEMOGLOBIN A1C
Hgb A1c MFr Bld: 6.3 % — ABNORMAL HIGH (ref 4.8–5.6)
MEAN PLASMA GLUCOSE: 134 mg/dL

## 2017-02-21 LAB — VITAMIN D 25 HYDROXY (VIT D DEFICIENCY, FRACTURES): VIT D 25 HYDROXY: 28.4 ng/mL — AB (ref 30.0–100.0)

## 2017-02-27 ENCOUNTER — Other Ambulatory Visit: Payer: Self-pay | Admitting: Internal Medicine

## 2017-03-11 ENCOUNTER — Other Ambulatory Visit: Payer: Self-pay | Admitting: Internal Medicine

## 2017-04-09 ENCOUNTER — Other Ambulatory Visit: Payer: Self-pay | Admitting: Internal Medicine

## 2017-04-14 ENCOUNTER — Other Ambulatory Visit: Payer: Self-pay | Admitting: Internal Medicine

## 2017-04-24 LAB — HM DIABETES EYE EXAM

## 2017-05-06 ENCOUNTER — Other Ambulatory Visit: Payer: Self-pay | Admitting: Internal Medicine

## 2017-05-25 ENCOUNTER — Encounter: Payer: Self-pay | Admitting: Internal Medicine

## 2017-05-25 ENCOUNTER — Ambulatory Visit (INDEPENDENT_AMBULATORY_CARE_PROVIDER_SITE_OTHER): Payer: Medicare Other | Admitting: Internal Medicine

## 2017-05-25 ENCOUNTER — Ambulatory Visit (INDEPENDENT_AMBULATORY_CARE_PROVIDER_SITE_OTHER): Payer: Medicare Other

## 2017-05-25 DIAGNOSIS — E7800 Pure hypercholesterolemia, unspecified: Secondary | ICD-10-CM

## 2017-05-25 DIAGNOSIS — Z23 Encounter for immunization: Secondary | ICD-10-CM | POA: Diagnosis not present

## 2017-05-25 DIAGNOSIS — E119 Type 2 diabetes mellitus without complications: Secondary | ICD-10-CM

## 2017-05-25 DIAGNOSIS — D469 Myelodysplastic syndrome, unspecified: Secondary | ICD-10-CM | POA: Diagnosis not present

## 2017-05-25 DIAGNOSIS — I10 Essential (primary) hypertension: Secondary | ICD-10-CM | POA: Diagnosis not present

## 2017-05-25 DIAGNOSIS — M79644 Pain in right finger(s): Secondary | ICD-10-CM | POA: Insufficient documentation

## 2017-05-25 DIAGNOSIS — E669 Obesity, unspecified: Secondary | ICD-10-CM | POA: Diagnosis not present

## 2017-05-25 DIAGNOSIS — R42 Dizziness and giddiness: Secondary | ICD-10-CM

## 2017-05-25 NOTE — Progress Notes (Signed)
Patient ID: Kara Griffin, female   DOB: [birth year], 81 y.o.   MRN: [id number]   Subjective:    Patient ID: Kara Griffin, female    DOB: 1935-12-08, 81 y.o.   MRN: [id number]  HPI  Patient here for a scheduled follow up.  She reports she has had pain and swelling base of right thumb.  States started 04/30/17.  Having increased pain.  Previously felt hot.  No redness.  Noticed over the last couple of days - some better.  Pain some better.  Still with tenderness to touch.  No known injury or trauma.  Also reports dizziness.  Has been present for 6 months.  States noticed more when turns fast.  She adjusted her losartan.  Now taking 1/2 tablet bid.  States since doing this, does not have issue with dizziness.  No headache.  No chest pain.  No sob.  No acid reflux.  No abdominal pain.  Bowels moving.  AM sugars averaging 140-160s and pm sugars averaging 150-180s.     Past Medical History:  Diagnosis Date  . Anemia   . Diabetes mellitus ([place])   . Environmental allergies   . Hypercholesterolemia   . Hypertension   . Myelodysplastic syndrome ([place])    Sees Dr Jerrye Noble   Past Surgical History:  Procedure Laterality Date  . ABDOMINAL HYSTERECTOMY    . [place]  . DILATION AND CURETTAGE OF UTERUS    . TONSILECTOMY, ADENOIDECTOMY, BILATERAL MYRINGOTOMY AND TUBES  1963  . TOTAL ABDOMINAL HYSTERECTOMY W/ BILATERAL SALPINGOOPHORECTOMY  1985   secondary to abnormal cells and abnormal uterine bleeding   Family History  Problem Relation Age of Onset  . Heart disease Father        Deceased (MI) - 71  . Diabetes Mother        Deceased  . Thyroid cancer Mother   . Osteoarthritis Mother   . Asthma Unknown   . Diabetes Brother   . Heart disease Maternal Grandmother        myocardial infarction - 18  . Asthma Maternal Grandmother   . Heart disease Paternal Grandmother        myocardial infarction-52  . Heart disease Brother   . Sinusitis Brother   . Depression  Son   . Diabetes Son   . Depression Son   . Diabetes Son   . Depression Son   . Breast cancer Neg Hx    Social History   Social History  . Marital status: Divorced    Spouse name: N/A  . Number of children: 3  . Years of education: N/A   Occupational History  . retired Games developer    Social History Main Topics  . Smoking status: Never Smoker  . Smokeless tobacco: Never Used  . Alcohol use No  . Drug use: No  . Sexual activity: No   Other Topics Concern  . None   Social History Narrative  . None    Outpatient Encounter Prescriptions as of 05/25/2017  Medication Sig  . aspirin 81 MG tablet Take 81 mg by mouth daily.  . Calcium Carbonate-Vitamin D (CALCIUM 600+D) 600-400 MG-UNIT per tablet Take 1 tablet by mouth daily. Reported on 01/15/2016  . cyanocobalamin 500 MCG tablet Take 1,000 mcg by mouth daily.   . fish oil-omega-3 fatty acids 1000 MG capsule Take 1 g by mouth daily.   . fluticasone (FLONASE) 50 MCG/ACT nasal spray Place 2 sprays into both nostrils daily.  Marland Kitchen  JANUVIA 100 MG tablet TAKE 1 TABLET(100 MG) BY MOUTH DAILY  . lenalidomide (REVLIMID) 10 MG capsule Take 10 mg by mouth daily.  Marland Kitchen losartan (COZAAR) 50 MG tablet TAKE 1 TABLET BY MOUTH EVERY DAY  . metFORMIN (GLUCOPHAGE) 1000 MG tablet TAKE 1 TABLET BY MOUTH TWICE DAILY  . Multiple Vitamin (MULTIVITAMIN) tablet Take 1 tablet by mouth daily.  Marland Kitchen nystatin cream (MYCOSTATIN) Apply 1 application topically 2 (two) times daily. (Patient taking differently: Apply 1 application topically 2 (two) times daily as needed. )  . ONE TOUCH LANCETS MISC Use to check sugars 1-2 times a day Dx. E11.9 (One Touch Ultra Mini)  . ONE TOUCH ULTRA TEST test strip TEST BLOOD SUGAR TWICE DAILY AS DIRECTED   No facility-administered encounter medications on file as of 05/25/2017.     Review of Systems  Constitutional: Negative for appetite change and unexpected weight change.  HENT: Negative for congestion and sinus pressure.     Respiratory: Negative for cough, chest tightness and shortness of breath.   Cardiovascular: Negative for chest pain, palpitations and leg swelling.  Gastrointestinal: Negative for abdominal pain, diarrhea, nausea and vomiting.  Genitourinary: Negative for difficulty urinating and dysuria.  Musculoskeletal: Positive for joint swelling. Negative for myalgias.       Right thumb pain and swelling as outlined.    Skin: Negative for color change and rash.  Neurological: Positive for dizziness. Negative for headaches.  Psychiatric/Behavioral: Negative for agitation and dysphoric mood.       Objective:     Blood pressure rechecked by me:  130/72  Physical Exam  Constitutional: She appears well-developed and well-nourished. No distress.  HENT:  Nose: Nose normal.  Mouth/Throat: Oropharynx is clear and moist.  Neck: Neck supple. No thyromegaly present.  Cardiovascular: Normal rate and regular rhythm.   Pulmonary/Chest: Breath sounds normal. No respiratory distress. She has no wheezes.  Abdominal: Soft. Bowel sounds are normal. There is no tenderness.  Musculoskeletal: She exhibits no edema.  Increased soft tissue swelling - base of right thumb.  Increased pain - base of right thumb.  No erythema.   Lymphadenopathy:    She has no cervical adenopathy.  Skin: No rash noted. No erythema.  Psychiatric: She has a normal mood and affect. Her behavior is normal.    BP 138/76 (BP Location: Left Arm, Patient Position: Sitting, Cuff Size: Large)   Pulse 90   Temp 97.9 F (36.6 C) (Oral)   Resp 20   Ht 5' 1.02" (1.55 m)   Wt 178 lb 6.4 oz (80.9 kg)   LMP 08/13/1984   SpO2 95%   BMI 33.68 kg/m  Wt Readings from Last 3 Encounters:  05/25/17 178 lb 6.4 oz (80.9 kg)  02/13/17 184 lb 6.4 oz (83.6 kg)  01/10/17 181 lb (82.1 kg)     Lab Results  Component Value Date   WBC 4.2 06/10/2014   HGB 13.5 06/10/2014   HCT 40 06/10/2014   PLT 133 (A) 06/10/2014   GLUCOSE 172 (H) 01/15/2016    CHOL 141 02/20/2017   TRIG 227 (H) 02/20/2017   HDL 44 02/20/2017   LDLDIRECT 85.3 08/23/2013   LDLCALC 52 02/20/2017   ALT 23 10/05/2015   AST 17 10/05/2015   NA 139 01/15/2016   K 3.9 01/15/2016   CL 106 01/15/2016   CREATININE 0.64 01/15/2016   BUN 18 01/15/2016   CO2 24 01/15/2016   TSH 1.586 02/20/2017   HGBA1C 6.3 (H) 02/20/2017   MICROALBUR 1.0  01/16/2015       Assessment & Plan:   Problem List Items Addressed This Visit    Diabetes mellitus ([place])    Low carb diet and exercise.  Follow met b and a1c.  Sugars as outlined.        Dizziness    Dizziness as outlined.  Previously saw ENT.  S/p Epley maneuvers.  Previously improved.  Dizziness returned.  She adjusted her blood pressure medication (dosing) as outlined.  No dizziness now.  Follow.  Discussed further w/up, including head scan, etc.  She declines.  Follow.       Hypercholesterolemia    Low cholesterol diet and exercise.  Follow lipid panel.        Hypertension    Blood pressure under good control.  Continue same medication regimen.  Follow pressures.  Follow metabolic panel.        Myelodysplastic syndrome ([place])    Followed by hematology.  Followed by Dr Florene Glen.  Stable.       Obesity (BMI 30-39.9)    Discussed diet and exercise.  Follow.        Pain of right thumb    Increased pain and swelling of base of right thumb.  Persistent.  Check xray.  Has tried splint.  Did not help.  May need rheumatology referral.        Relevant Orders   DG Finger Thumb Right (Completed)    Other Visit Diagnoses    Encounter for immunization       Relevant Orders   Flu vaccine HIGH DOSE PF (Completed)       Einar Pheasant, MD

## 2017-05-26 ENCOUNTER — Other Ambulatory Visit: Payer: Self-pay | Admitting: Internal Medicine

## 2017-05-26 DIAGNOSIS — M79644 Pain in right finger(s): Secondary | ICD-10-CM

## 2017-05-26 NOTE — Progress Notes (Signed)
Order placed for rheumatology appt. referral.

## 2017-05-28 ENCOUNTER — Encounter: Payer: Self-pay | Admitting: Internal Medicine

## 2017-05-28 NOTE — Assessment & Plan Note (Signed)
Followed by hematology.  Followed by Dr Florene Glen.  Stable.

## 2017-05-28 NOTE — Assessment & Plan Note (Signed)
Dizziness as outlined.  Previously saw ENT.  S/p Epley maneuvers.  Previously improved.  Dizziness returned.  She adjusted her blood pressure medication (dosing) as outlined.  No dizziness now.  Follow.  Discussed further w/up, including head scan, etc.  She declines.  Follow.

## 2017-05-28 NOTE — Assessment & Plan Note (Signed)
Low carb diet and exercise.  Follow met b and a1c.  Sugars as outlined.   

## 2017-05-28 NOTE — Assessment & Plan Note (Signed)
Blood pressure under good control.  Continue same medication regimen.  Follow pressures.  Follow metabolic panel.   

## 2017-05-28 NOTE — Assessment & Plan Note (Signed)
Discussed diet and exercise.  Follow.  

## 2017-05-28 NOTE — Assessment & Plan Note (Signed)
Increased pain and swelling of base of right thumb.  Persistent.  Check xray.  Has tried splint.  Did not help.  May need rheumatology referral.

## 2017-05-28 NOTE — Assessment & Plan Note (Signed)
Low cholesterol diet and exercise.  Follow lipid panel.   

## 2017-05-30 ENCOUNTER — Other Ambulatory Visit: Payer: Self-pay | Admitting: Internal Medicine

## 2017-06-02 ENCOUNTER — Other Ambulatory Visit: Payer: Self-pay | Admitting: Internal Medicine

## 2017-06-05 ENCOUNTER — Other Ambulatory Visit: Payer: Self-pay | Admitting: Internal Medicine

## 2017-06-26 ENCOUNTER — Ambulatory Visit
Admission: EM | Admit: 2017-06-26 | Discharge: 2017-06-26 | Disposition: A | Discharge status: Home / Self Care | Payer: Medicare Other | Attending: Family Medicine | Admitting: Family Medicine

## 2017-06-26 DIAGNOSIS — S161XXA Strain of muscle, fascia and tendon at neck level, initial encounter: Secondary | ICD-10-CM

## 2017-06-26 MED ORDER — CYCLOBENZAPRINE HCL 5 MG PO TABS: 5.0000 mg | ORAL_TABLET | Freq: Three times a day (TID) | ORAL | 0 refills | Status: DC | PRN

## 2017-06-26 MED ORDER — HYDROCODONE-ACETAMINOPHEN 5-325 MG PO TABS: ORAL_TABLET | ORAL | 0 refills | Status: DC

## 2017-06-26 NOTE — ED Triage Notes (Signed)
Patient complains of severe right neck pain that radiates up neck to head and shoulder. Patient states that this started suddenly on Saturday after no known injury.

## 2017-06-26 NOTE — Discharge Instructions (Signed)
Heat to area and stretch

## 2017-06-26 NOTE — ED Provider Notes (Signed)
[facility]    CSN: [id number] Arrival date & time: 06/26/17  0910     History   Chief Complaint Chief Complaint  Patient presents with  . Neck Pain    HPI Kara Griffin is a 81 y.o. female.   The history is provided by the patient.  Neck Pain  Pain location:  Occipital region (down into the right trapezius area) Quality:  Cramping and aching Pain radiates to:  R shoulder Pain severity:  Moderate Pain is:  Same all the time Onset quality:  Sudden Duration:  3 days Timing:  Constant Progression:  Unchanged Chronicity:  New Context: not fall, not jumping from heights and not lifting a heavy object   Context comment:  Woke up with the pain Relieved by:  Nothing Ineffective treatments:  Analgesics Associated symptoms: no bladder incontinence, no bowel incontinence, no chest pain, no fever, no headaches, no leg pain, no numbness, no paresis, no photophobia, no syncope, no tingling, no visual change, no weakness and no weight loss   Risk factors: no hx of head and neck radiation, no hx of osteoporosis, no hx of spinal trauma, no recent epidural, no recent head injury and no recurrent falls     Past Medical History:  Diagnosis Date  . Anemia   . Diabetes mellitus ([place])   . Environmental allergies   . Hypercholesterolemia   . Hypertension   . Myelodysplastic syndrome ([place])    Sees Dr Jerrye Noble    Patient Active Problem List   Diagnosis Date Noted  . Pain of right thumb 05/25/2017  . Fall 02/14/2017  . Neuropathy 05/17/2016  . Left hip pain 01/15/2016  . URI (upper respiratory infection) 08/09/2015  . Vaginal irritation 05/23/2015  . Health care maintenance 01/19/2015  . Dizziness 01/16/2015  . Obesity (BMI 30-39.9) 09/14/2014  . Hypercholesterolemia 08/13/2012  . Thyroid nodule 08/13/2012  . Osteopenia 08/13/2012  . Myelodysplastic syndrome ([place]) 06/04/2012  . Hypertension 06/04/2012  . Diabetes mellitus ([place]) 06/04/2012  .  Environmental allergies 06/04/2012    Past Surgical History:  Procedure Laterality Date  . ABDOMINAL HYSTERECTOMY    . [place]  . DILATION AND CURETTAGE OF UTERUS    . TONSILECTOMY, ADENOIDECTOMY, BILATERAL MYRINGOTOMY AND TUBES  1963  . TOTAL ABDOMINAL HYSTERECTOMY W/ BILATERAL SALPINGOOPHORECTOMY  1985   secondary to abnormal cells and abnormal uterine bleeding    OB History    No data available       Home Medications    Prior to Admission medications   Medication Sig Start Date End Date Taking? Authorizing Provider  aspirin 81 MG tablet Take 81 mg by mouth daily.   Yes [provider]  Calcium Carbonate-Vitamin D (CALCIUM 600+D) 600-400 MG-UNIT per tablet Take 1 tablet by mouth daily. Reported on 01/15/2016   Yes [provider]  cyanocobalamin 500 MCG tablet Take 1,000 mcg by mouth daily.    Yes [provider]  fish oil-omega-3 fatty acids 1000 MG capsule Take 1 g by mouth daily.    Yes [provider]  fluticasone (FLONASE) 50 MCG/ACT nasal spray Place 2 sprays into both nostrils daily.   Yes [provider]  JANUVIA 100 MG tablet TAKE 1 TABLET(100 MG) BY MOUTH DAILY 06/05/17  Yes Einar Pheasant, MD  lenalidomide (REVLIMID) 10 MG capsule Take 10 mg by mouth daily.   Yes [provider]  losartan (COZAAR) 50 MG tablet TAKE 1 TABLET BY MOUTH EVERY DAY 05/30/17  Yes  Einar Pheasant, MD  metFORMIN (GLUCOPHAGE) 1000 MG tablet TAKE 1 TABLET BY MOUTH TWICE DAILY 12/13/16  Yes Einar Pheasant, MD  Multiple Vitamin (MULTIVITAMIN) tablet Take 1 tablet by mouth daily.   Yes [provider]  nystatin cream (MYCOSTATIN) Apply 1 application topically 2 (two) times daily. Patient taking differently: Apply 1 application topically 2 (two) times daily as needed.  05/22/15  Yes Einar Pheasant, MD  ONE TOUCH LANCETS MISC Use to check sugars 1-2 times a day Dx. E11.9 (One Touch Ultra Mini) 09/08/14  Yes Einar Pheasant, MD    ONE TOUCH ULTRA TEST test strip TEST BLOOD SUGAR TWICE DAILY AS DIRECTED 06/02/17  Yes Einar Pheasant, MD  cyclobenzaprine (FLEXERIL) 5 MG tablet Take 1 tablet (5 mg total) by mouth 3 (three) times daily as needed for muscle spasms. 06/26/17   Norval Gable, MD  HYDROcodone-acetaminophen (NORCO/VICODIN) 5-325 MG tablet 1 tab po q 8 hours prn 06/26/17   Norval Gable, MD    Family History Family History  Problem Relation Age of Onset  . Heart disease Father        Deceased (MI) - 59  . Diabetes Mother        Deceased  . Thyroid cancer Mother   . Osteoarthritis Mother   . Asthma Unknown   . Diabetes Brother   . Heart disease Maternal Grandmother        myocardial infarction - 72  . Asthma Maternal Grandmother   . Heart disease Paternal Grandmother        myocardial infarction-52  . Heart disease Brother   . Sinusitis Brother   . Depression Son   . Diabetes Son   . Depression Son   . Diabetes Son   . Depression Son   . Breast cancer Neg Hx     Social History Social History  Substance Use Topics  . Smoking status: Never Smoker  . Smokeless tobacco: Never Used  . Alcohol use No     Allergies   Patient has no known allergies.   Review of Systems Review of Systems  Constitutional: Negative for fever and weight loss.  Eyes: Negative for photophobia.  Cardiovascular: Negative for chest pain and syncope.  Gastrointestinal: Negative for bowel incontinence.  Genitourinary: Negative for bladder incontinence.  Musculoskeletal: Positive for neck pain.  Neurological: Negative for tingling, weakness, numbness and headaches.     Physical Exam Triage Vital Signs ED Triage Vitals  Enc Vitals Group     BP 06/26/17 0927 (!) 146/62     Pulse Rate 06/26/17 0927 80     Resp 06/26/17 0927 17     Temp 06/26/17 0927 97.8 F (36.6 C)     Temp Source 06/26/17 0927 Oral     SpO2 06/26/17 0927 100 %     Weight 06/26/17 0925 175 lb 9.6 oz (79.7 kg)     Height 06/26/17 0925 5'  1" (1.549 m)     Head Circumference --      Peak Flow --      Pain Score 06/26/17 0928 8     Pain Loc --      Pain Edu? --      Excl. in [place]? --    No data found.   Updated Vital Signs BP (!) 146/62 (BP Location: Left Arm)   Pulse 80   Temp 97.8 F (36.6 C) (Oral)   Resp 17   Ht 5\' 1"  (1.549 m)   Wt 175 lb 9.6 oz (79.7 kg)  LMP 08/13/1984   SpO2 100%   BMI 33.18 kg/m   Visual Acuity Right Eye Distance:   Left Eye Distance:   Bilateral Distance:    Right Eye Near:   Left Eye Near:    Bilateral Near:     Physical Exam  Constitutional: She is oriented to person, place, and time. She appears well-developed and well-nourished. No distress.  HENT:  Head: Normocephalic and atraumatic.  Eyes: Pupils are equal, round, and reactive to light. EOM are normal.  Neck: Normal range of motion. Neck supple. No tracheal deviation present. No thyromegaly present.  Musculoskeletal: She exhibits no edema.       Cervical back: She exhibits tenderness (over the right trapezius) and spasm. She exhibits normal range of motion, no bony tenderness, no swelling, no edema, no deformity, no laceration and normal pulse.       Back:  Lymphadenopathy:    She has no cervical adenopathy.  Neurological: She is alert and oriented to person, place, and time. She has normal reflexes. No cranial nerve deficit. She exhibits normal muscle tone. Coordination normal.  Skin: She is not diaphoretic.  Nursing note and vitals reviewed.    UC Treatments / Results  Labs (all labs ordered are listed, but only abnormal results are displayed) Labs Reviewed - No data to display  EKG  EKG Interpretation None       Radiology No results found.  Procedures Procedures (including critical care time)  Medications Ordered in UC Medications - No data to display   Initial Impression / Assessment and Plan / UC Course  I have reviewed the triage vital signs and the nursing notes.  Pertinent labs & imaging  results that were available during my care of the patient were reviewed by me and considered in my medical decision making (see chart for details).      Final Clinical Impressions(s) / UC Diagnoses   Final diagnoses:  Strain of neck muscle, initial encounter    New Prescriptions Discharge Medication List as of 06/26/2017  9:46 AM    START taking these medications   Details  cyclobenzaprine (FLEXERIL) 5 MG tablet Take 1 tablet (5 mg total) by mouth 3 (three) times daily as needed for muscle spasms., Starting Mon 06/26/2017, Normal    HYDROcodone-acetaminophen (NORCO/VICODIN) 5-325 MG tablet 1 tab po q 8 hours prn, Print       1. diagnosis reviewed with patient 2. rx as per orders above; reviewed possible side effects, interactions, risks and benefits  3. Recommend supportive treatment with heat to area and stretching 4. Follow-up prn if symptoms worsen or don't improve Controlled Substance Prescriptions [place] Controlled Substance Registry consulted? No   Norval Gable, MD 06/26/17 1000

## 2017-06-29 ENCOUNTER — Encounter: Payer: Self-pay | Admitting: Primary Care

## 2017-06-29 ENCOUNTER — Ambulatory Visit (INDEPENDENT_AMBULATORY_CARE_PROVIDER_SITE_OTHER): Payer: Medicare Other | Admitting: Primary Care

## 2017-06-29 ENCOUNTER — Other Ambulatory Visit: Payer: Self-pay | Admitting: Internal Medicine

## 2017-06-29 VITALS — BP 136/74 | HR 82 | Temp 98.0°F | Wt 176.8 lb

## 2017-06-29 DIAGNOSIS — M62838 Other muscle spasm: Secondary | ICD-10-CM | POA: Diagnosis not present

## 2017-06-29 DIAGNOSIS — R51 Headache: Secondary | ICD-10-CM | POA: Diagnosis not present

## 2017-06-29 DIAGNOSIS — R519 Headache, unspecified: Secondary | ICD-10-CM

## 2017-06-29 MED ORDER — METHYLPREDNISOLONE ACETATE 80 MG/ML IJ SUSP
80.0000 mg | Freq: Once | INTRAMUSCULAR | Status: AC
  Administered 2017-06-29: 80 mg via INTRAMUSCULAR

## 2017-06-29 NOTE — Progress Notes (Signed)
Subjective:    Patient ID: Kara Griffin, female    DOB: 1936-05-06, 81 y.o.   MRN: [id number]  HPI  Kara Griffin is a 81 year old female with a history of obesity and osteopenia who presents today with a chief complaint of neck pain.   She was evaluated at [facility] on 06/26/17 with a chief complaint of neck pain with radiation down to right trapezius and right shoulder that began one day prioir since waking. She was provided with prescriptions for cyclobenzaprine and hydrocodone and sent home with instructions for stretching and heat application.  Since her urgent care visit she's been taking cyclobenzaprine and hydrocodone with some improvement. She's not taking anything else OTC for her symptoms. She has noticed improvement to the right trapezius area but continues to experience right lower neck pain which radiates up to her right occipital and parietal head. She describes this as sharp/shooting pain. She continues to experience a decrease in ROM to her neck due to pain and stiffness. She cannot take NSAID's given treatment on lenalidomide. She denies numbness/tinlging, weakness, injury/trauma, fevers, visual change.   Review of Systems  Constitutional: Negative for fever.  Respiratory: Negative for cough and shortness of breath.   Cardiovascular: Negative for chest pain.  Musculoskeletal: Positive for neck pain and neck stiffness.  Skin: Negative for color change.  Neurological: Positive for headaches. Negative for dizziness and weakness.       Past Medical History:  Diagnosis Date  . Anemia   . Diabetes mellitus ([place])   . Environmental allergies   . Hypercholesterolemia   . Hypertension   . Myelodysplastic syndrome ([place])    Sees Dr Jerrye Noble     Social History   Social History  . Marital status: Divorced    Spouse name: N/A  . Number of children: 3  . Years of education: N/A   Occupational History  . retired Games developer    Social History Main  Topics  . Smoking status: Never Smoker  . Smokeless tobacco: Never Used  . Alcohol use No  . Drug use: No  . Sexual activity: No   Other Topics Concern  . Not on file   Social History Narrative  . No narrative on file    Past Surgical History:  Procedure Laterality Date  . ABDOMINAL HYSTERECTOMY    . [place]  . DILATION AND CURETTAGE OF UTERUS    . TONSILECTOMY, ADENOIDECTOMY, BILATERAL MYRINGOTOMY AND TUBES  1963  . TOTAL ABDOMINAL HYSTERECTOMY W/ BILATERAL SALPINGOOPHORECTOMY  1985   secondary to abnormal cells and abnormal uterine bleeding    Family History  Problem Relation Age of Onset  . Heart disease Father        Deceased (MI) - 40  . Diabetes Mother        Deceased  . Thyroid cancer Mother   . Osteoarthritis Mother   . Asthma Unknown   . Diabetes Brother   . Heart disease Maternal Grandmother        myocardial infarction - 22  . Asthma Maternal Grandmother   . Heart disease Paternal Grandmother        myocardial infarction-52  . Heart disease Brother   . Sinusitis Brother   . Depression Son   . Diabetes Son   . Depression Son   . Diabetes Son   . Depression Son   . Breast cancer Neg Hx     No Known Allergies  Current Outpatient Prescriptions  on File Prior to Visit  Medication Sig Dispense Refill  . aspirin 81 MG tablet Take 81 mg by mouth daily.    . Calcium Carbonate-Vitamin D (CALCIUM 600+D) 600-400 MG-UNIT per tablet Take 1 tablet by mouth daily. Reported on 01/15/2016    . cyanocobalamin 500 MCG tablet Take 1,000 mcg by mouth daily.     . cyclobenzaprine (FLEXERIL) 5 MG tablet Take 1 tablet (5 mg total) by mouth 3 (three) times daily as needed for muscle spasms. 30 tablet 0  . fish oil-omega-3 fatty acids 1000 MG capsule Take 1 g by mouth daily.     . fluticasone (FLONASE) 50 MCG/ACT nasal spray Place 2 sprays into both nostrils daily.    Marland Kitchen HYDROcodone-acetaminophen (NORCO/VICODIN) 5-325 MG tablet 1 tab po q 8 hours prn 10 tablet 0   . JANUVIA 100 MG tablet TAKE 1 TABLET(100 MG) BY MOUTH DAILY 30 tablet 0  . lenalidomide (REVLIMID) 10 MG capsule Take 10 mg by mouth daily.    . metFORMIN (GLUCOPHAGE) 1000 MG tablet TAKE 1 TABLET BY MOUTH TWICE DAILY 60 tablet 6  . Multiple Vitamin (MULTIVITAMIN) tablet Take 1 tablet by mouth daily.    Marland Kitchen nystatin cream (MYCOSTATIN) Apply 1 application topically 2 (two) times daily. (Patient taking differently: Apply 1 application topically 2 (two) times daily as needed. ) 45 g 0  . ONE TOUCH LANCETS MISC Use to check sugars 1-2 times a day Dx. E11.9 (One Touch Ultra Mini) 100 each 3  . ONE TOUCH ULTRA TEST test strip TEST BLOOD SUGAR TWICE DAILY AS DIRECTED 100 each 0   No current facility-administered medications on file prior to visit.     BP 136/74   Pulse 82   Temp 98 F (36.7 C) (Oral)   Wt 176 lb 12.8 oz (80.2 kg)   LMP 08/13/1984   SpO2 96%   BMI 33.41 kg/m    Objective:   Physical Exam  Constitutional: She appears well-nourished.  Neck: Neck supple. Muscular tenderness present. No spinous process tenderness present. Decreased range of motion present.    Decrease in ROM in all planes including lateral rotation, flexion, and extension.  Cardiovascular: Normal rate and regular rhythm.   Pulmonary/Chest: Effort normal and breath sounds normal.  Neurological:  No tenderness to right temporal region, no tenderness to Trigeminal nerve.  Skin: Skin is warm and dry.          Assessment & Plan:  Muscle Spasm Of Neck:  Since 06/25/17. Some improvement with cyclobenzaprine, little improvement with Norco. Now with headache. Exam today consistent for MSK involvement, specifically spasm.  She kindly declines physical therapy referral.  Discouraged use of Norco. Recommended to continue cyclobenzaprine. IM Depo Medrol provided for right sided headache and inflammation.  Will have her use tylenol and heating pads PRN. Provided with neck exercises and encouraged this  numerous times daily. No alarm signs or signs of infection. Follow up with PCP if no improvement in 3-4 days.  All urgent care notes reviewed. Sheral Flow, NP

## 2017-06-29 NOTE — Patient Instructions (Signed)
You were provided with an injection of Depo Medrol for the sharp/shooting headache.   Continue the cyclobenzaprine to help relax the muscles.  You may take Tylenol as needed for breakthrough pain, do not exceed 3000 mg in 24 hours.  Continue application of heating pads as needed.  You must start stretching and moving the neck if possible. Take a look at the exercises below.  Follow up with your PCP if no improvement in 3-4 days.  It was a pleasure meeting you!    Neck Exercises Neck exercises can be important for many reasons:  They can help you to improve and maintain flexibility in your neck. This can be especially important as you age.  They can help to make your neck stronger. This can make movement easier.  They can reduce or prevent neck pain.  They may help your upper back.  Ask your health care provider which neck exercises would be best for you. Exercises Neck Press Repeat this exercise 10 times. Do it first thing in the morning and right before bed or as told by your health care provider. 1. Lie on your back on a firm bed or on the floor with a pillow under your head. 2. Use your neck muscles to push your head down on the pillow and straighten your spine. 3. Hold the position as well as you can. Keep your head facing up and your chin tucked. 4. Slowly count to 5 while holding this position. 5. Relax for a few seconds. Then repeat.  Isometric Strengthening Do a full set of these exercises 2 times a day or as told by your health care provider. 1. Sit in a supportive chair and place your hand on your forehead. 2. Push forward with your head and neck while pushing back with your hand. Hold for 10 seconds. 3. Relax. Then repeat the exercise 3 times. 4. Next, do thesequence again, this time putting your hand against the back of your head. Use your head and neck to push backward against the hand pressure. 5. Finally, do the same exercise on either side of your head,  pushing sideways against the pressure of your hand.  Prone Head Lifts Repeat this exercise 5 times. Do this 2 times a day or as told by your health care provider. 1. Lie face-down, resting on your elbows so that your chest and upper back are raised. 2. Start with your head facing downward, near your chest. Position your chin either on or near your chest. 3. Slowly lift your head upward. Lift until you are looking straight ahead. Then continue lifting your head as far back as you can stretch. 4. Hold your head up for 5 seconds. Then slowly lower it to your starting position.  Supine Head Lifts Repeat this exercise 8-10 times. Do this 2 times a day or as told by your health care provider. 1. Lie on your back, bending your knees to point to the ceiling and keeping your feet flat on the floor. 2. Lift your head slowly off the floor, raising your chin toward your chest. 3. Hold for 5 seconds. 4. Relax and repeat.  Scapular Retraction Repeat this exercise 5 times. Do this 2 times a day or as told by your health care provider. 1. Stand with your arms at your sides. Look straight ahead. 2. Slowly pull both shoulders backward and downward until you feel a stretch between your shoulder blades in your upper back. 3. Hold for 10-30 seconds. 4. Relax and repeat.  Contact a health care provider if:  Your neck pain or discomfort gets much worse when you do an exercise.  Your neck pain or discomfort does not improve within 2 hours after you exercise. If you have any of these problems, stop exercising right away. Do not do the exercises again unless your health care provider says that you can. Get help right away if:  You develop sudden, severe neck pain. If this happens, stop exercising right away. Do not do the exercises again unless your health care provider says that you can. Exercises Neck Stretch  Repeat this exercise 3-5 times. 1. Do this exercise while standing or while sitting in a  chair. 2. Place your feet flat on the floor, shoulder-width apart. 3. Slowly turn your head to the right. Turn it all the way to the right so you can look over your right shoulder. Do not tilt or tip your head. 4. Hold this position for 10-30 seconds. 5. Slowly turn your head to the left, to look over your left shoulder. 6. Hold this position for 10-30 seconds.  Neck Retraction Repeat this exercise 8-10 times. Do this 3-4 times a day or as told by your health care provider. 1. Do this exercise while standing or while sitting in a sturdy chair. 2. Look straight ahead. Do not bend your neck. 3. Use your fingers to push your chin backward. Do not bend your neck for this movement. Continue to face straight ahead. If you are doing the exercise properly, you will feel a slight sensation in your throat and a stretch at the back of your neck. 4. Hold the stretch for 1-2 seconds. Relax and repeat.  This information is not intended to replace advice given to you by your health care provider. Make sure you discuss any questions you have with your health care provider. Document Released: 08/03/2015 Document Revised: 01/28/2016 Document Reviewed: 03/02/2015 Elsevier Interactive Patient Education  2017 [employer].

## 2017-06-29 NOTE — Addendum Note (Signed)
Addended by: Jacqualin Combes on: 06/29/2017 12:44 PM   Modules accepted: Orders

## 2017-07-04 ENCOUNTER — Other Ambulatory Visit: Payer: Self-pay | Admitting: Internal Medicine

## 2017-07-11 ENCOUNTER — Encounter: Payer: Self-pay | Admitting: Occupational Therapy

## 2017-07-11 ENCOUNTER — Other Ambulatory Visit: Payer: Self-pay | Admitting: Internal Medicine

## 2017-07-11 ENCOUNTER — Ambulatory Visit: Payer: Medicare Other | Attending: Internal Medicine | Admitting: Occupational Therapy

## 2017-07-11 DIAGNOSIS — M79641 Pain in right hand: Secondary | ICD-10-CM

## 2017-07-11 DIAGNOSIS — M25652 Stiffness of left hip, not elsewhere classified: Secondary | ICD-10-CM | POA: Diagnosis present

## 2017-07-11 DIAGNOSIS — M25641 Stiffness of right hand, not elsewhere classified: Secondary | ICD-10-CM | POA: Diagnosis present

## 2017-07-11 DIAGNOSIS — M6281 Muscle weakness (generalized): Secondary | ICD-10-CM | POA: Diagnosis present

## 2017-07-11 NOTE — Therapy (Signed)
[facility] [address], [place], [address] Phone: [phone]   Fax:  [phone]  Occupational Therapy Evaluation  Patient Details  Name: Kara Griffin MRN: [id number] Date of Birth: 10/03/1935 Referring Provider: Meda Coffee   Encounter Date: 07/11/2017  OT End of Session - 07/11/17 1448    Visit Number  1    Number of Visits  4    Date for OT Re-Evaluation  08/22/17    OT Start Time  1856    OT Stop Time  1418    OT Time Calculation (min)  73 min    Activity Tolerance  Patient tolerated treatment well    Behavior During Therapy  [facility] for tasks assessed/performed       Past Medical History:  Diagnosis Date  . Anemia   . Diabetes mellitus ([place])   . Environmental allergies   . Hypercholesterolemia   . Hypertension   . Myelodysplastic syndrome ([place])    Sees Dr Jerrye Noble    Past Surgical History:  Procedure Laterality Date  . ABDOMINAL HYSTERECTOMY    . [place]  . DILATION AND CURETTAGE OF UTERUS    . TONSILECTOMY, ADENOIDECTOMY, BILATERAL MYRINGOTOMY AND TUBES  1963  . TOTAL ABDOMINAL HYSTERECTOMY W/ BILATERAL SALPINGOOPHORECTOMY  1985   secondary to abnormal cells and abnormal uterine bleeding    There were no vitals filed for this visit.  Subjective Assessment - 07/11/17 1435    Subjective   I woke up one morning in early Aug with my R thumb hurting , red and swollen - did see MD in Oct and she gave me shot - did not help that much - my family gave me some splints- but it hurts still -  it do not hurt if I keep my thumb tugg to the side of palm     Patient Stated Goals  Have no pain in my thumb and use it more without pain - like writing better, cutting , lifting , brushing teeth , driving ,     Currently in Pain?  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, Oceanographer, reading , writing , email on computer,  cross word puzzle      Strength   Right Hand Grip (lbs)  27    Right Hand Lateral Pinch  9 lbs    Right Hand 3 Point Pinch  7 lbs    Left Hand Grip (lbs)  39    Left Hand Lateral Pinch  11 lbs    Left Hand 3 Point Pinch  7 lbs      Right Hand AROM   R Thumb MCP 0-60  40 Degrees    R Thumb IP  0-80  60 Degrees    R Thumb Radial ABduction/ADduction 0-55  33    R Thumb Palmar ABduction/ADduction 0-45  45    R Thumb Opposition to Index  -- Opposition to 3rd digit - pain with 4th and unable to do 5    Opposition to 3rd digit - pain with 4th and unable to do 5    R Index  MCP 0-90  70 Degrees      Left Hand AROM   L Thumb MCP 0-60  40 Degrees    L Thumb IP 0-80  60 Degrees    L Thumb Radial ADduction/ABduction 0-55  36    L Thumb Palmar ADduction/ABduction 0-45  45    L Thumb Opposition to Index  -- Opposition to 5th    Opposition to 5th       Contrast  AROM Opposition  Tendon glides  Joint protection and AE info review and hand out provided  Fitted with CMC neoprene splint for R thumb - to use with act to decrease pain                OT Education - 07/11/17 1441    Education provided  Yes    Education Details  findings of eval and ed on HEP and joint protection /AE     Person(s) Educated  Patient    Methods  Explanation;Demonstration;Tactile cues;Verbal cues;Handout    Comprehension  Verbalized understanding;Returned demonstration;Verbal cues required       OT Short Term Goals - 07/11/17 1445      OT SHORT TERM GOAL #1   Title  Pain on PRWHE Iimprove by more than 15  points     Baseline  PRWHE at eval for pain 29/50    Time  3    Period  Weeks    Status  New    Target Date  08/01/17      OT SHORT TERM GOAL #2   Title  Pt to be in in HEP for joint protection and ROM to increase thumb ROM to touching opposition to 5th and RA improve with more tha 5 degrees     Baseline  see flowsheet- pt keep thumb in ADD - decrease in all planes     Time  3    Period  Weeks    Status  New    Target Date  08/01/17        OT Long Term Goals - 07/11/17 1447      OT LONG TERM GOAL #1   Title  Pt to verbalize 3 joint protection and AE to decrease pain and report increase ease of Use of R hand in ADL's and IADl's     Baseline  using larger jionts - but none other adaptations -     Time  6    Period  Weeks    Status  New    Target Date  08/22/17      OT LONG TERM GOAL #2   Title  Grip strength in R improve with 5 lbs  to increase use of R hand     Baseline  Grip R 27 and L 39 lbs     Time  6    Period  Weeks    Status  New    Target Date  08/22/17            Plan - 07/11/17 1442    Clinical Impression Statement  Pt refer to OT/hand therapy for  R thumb pain more than L - pt report she had it since Aug - and little better - but still keep thumb in adduction - and try not to use it - pain still increase to 6/10 at the worse - pt show decrease thumb AROM in all planes , decrease grip and lat grip compare to L hand - pt is R hand dominant - pt can benefit  from OT services to decrease pain and increaes ROM /strength     Occupational performance deficits (Please refer to evaluation for details):  ADL's;IADL's;Play;Leisure;Social Participation    Rehab Potential  Fair    OT Frequency  1x / week    OT Duration  6 weeks    OT Treatment/Interventions  Self-care/ADL training;Fluidtherapy;Splinting;Patient/family education;Contrast Bath;Therapeutic exercises;Ultrasound;Parrafin;Manual Therapy    Plan  assess progress with homeprogram and upgrade     Clinical  Decision Making  Several treatment options, min-mod task modification necessary    OT Home Exercise Plan  see pt instruction     Consulted and Agree with Plan of Care  Patient       Patient will benefit from skilled therapeutic intervention in order to improve the following deficits and impairments:  Pain, Impaired flexibility, Decreased knowledge of use of DME, Decreased coordination, Decreased strength, Decreased range of motion, Impaired UE functional use  Visit Diagnosis: Pain in right hand - Plan: Ot plan of care cert/re-cert  Stiffness of right hand, not elsewhere classified - Plan: Ot plan of care cert/re-cert  Muscle weakness (generalized) - Plan: Ot plan of care cert/re-cert  G-Codes - 97/35/32 1450    Functional Assessment Tool Used (Outpatient only)  ROM , grip and prehension - PRWHE - clinical judgement     Functional Limitation  Self care    Self Care Current Status (D9242)  At least 20 percent but less than 40 percent impaired, limited or restricted    Self Care Goal Status (A8341)  At least 1 percent but less than 20 percent impaired, limited or restricted       Problem List Patient Active Problem List   Diagnosis Date Noted  . Pain of right thumb 05/25/2017  . Fall 02/14/2017  . Neuropathy 05/17/2016  . Left hip pain 01/15/2016  . URI (upper respiratory infection) 08/09/2015  . Vaginal irritation 05/23/2015  . Health care maintenance 01/19/2015  . Dizziness 01/16/2015  . Obesity (BMI 30-39.9) 09/14/2014  . Hypercholesterolemia 08/13/2012  . Thyroid nodule 08/13/2012  . Osteopenia 08/13/2012  . Myelodysplastic syndrome ([place]) 06/04/2012  . Hypertension 06/04/2012  . Diabetes mellitus ([place]) 06/04/2012  . Environmental allergies 06/04/2012    Rosalyn Gess OTR/L,CLT 07/11/2017, 2:53 PM  [facility] [address] [place], [address] Phone: [phone]   Fax:  [phone]  Name:  Kara Griffin MRN: [id number] Date of Birth: Jul 29, 1936

## 2017-07-11 NOTE — Patient Instructions (Signed)
Contrast  AROM Opposition  Tendon glides  Joint protection and AE info review and hand out provided

## 2017-07-18 ENCOUNTER — Ambulatory Visit: Payer: Medicare Other | Admitting: Occupational Therapy

## 2017-07-18 DIAGNOSIS — M6281 Muscle weakness (generalized): Secondary | ICD-10-CM

## 2017-07-18 DIAGNOSIS — M79641 Pain in right hand: Secondary | ICD-10-CM | POA: Diagnosis not present

## 2017-07-18 DIAGNOSIS — M25641 Stiffness of right hand, not elsewhere classified: Secondary | ICD-10-CM

## 2017-07-18 DIAGNOSIS — M25652 Stiffness of left hip, not elsewhere classified: Secondary | ICD-10-CM

## 2017-07-18 NOTE — Patient Instructions (Signed)
Same  But add thumb PA and RA AROM in pain free range

## 2017-07-18 NOTE — Therapy (Signed)
[facility] [address] [place], [address] Phone: [phone]   Fax:  [phone]  Occupational Therapy Treatment  Patient Details  Name: Kara Griffin MRN: [id number] Date of Birth: 01-23-1936 Referring Provider: Meda Coffee   Encounter Date: 07/18/2017  OT End of Session - 07/18/17 1424    Visit Number  2    Number of Visits  4    Date for OT Re-Evaluation  08/22/17    OT Start Time  1330    OT Stop Time  1416    OT Time Calculation (min)  46 min    Activity Tolerance  Patient tolerated treatment well    Behavior During Therapy  [facility] for tasks assessed/performed       Past Medical History:  Diagnosis Date  . Anemia   . Diabetes mellitus ([place])   . Environmental allergies   . Hypercholesterolemia   . Hypertension   . Myelodysplastic syndrome ([place])    Sees Dr Jerrye Noble    Past Surgical History:  Procedure Laterality Date  . ABDOMINAL HYSTERECTOMY    . [place]  . DILATION AND CURETTAGE OF UTERUS    . TONSILECTOMY, ADENOIDECTOMY, BILATERAL MYRINGOTOMY AND TUBES  1963  . TOTAL ABDOMINAL HYSTERECTOMY W/ BILATERAL SALPINGOOPHORECTOMY  1985   secondary to abnormal cells and abnormal uterine bleeding    There were no vitals filed for this visit.  Subjective Assessment - 07/18/17 1422    Subjective   Doing okay - I still feel like I guard my thumb holding it close to my hand - did do my exercises and can touch my ring finger with my thumb withou pain - and wearing soft splint with laundery and sweeping     Patient Stated Goals  Have no pain in my thumb and use it more without pain - like writing better, cutting , lifting , brushing teeth , driving ,     Currently in Pain?  Yes    Pain Score  1     Pain Location  Finger (Comment which one)    Pain Orientation  Right    Pain Descriptors / Indicators  Aching    Pain Type  Chronic pain    Pain Onset  More than a month ago    Pain Frequency  Intermittent         [facility] OT Assessment - 07/18/17 0001      Strength   Right Hand Grip (lbs)  30      Right Hand AROM   R Thumb MCP 0-60  45 Degrees    R Thumb IP 0-80  60 Degrees    R Thumb Radial ABduction/ADduction 0-55  40    R Thumb Palmar ABduction/ADduction 0-45  53      Measurements taken for thumb AROM - see flowsheet  Prehension strength same - and pain  But grip increase  Tender in webspace with soft tissue          OT Treatments/Exercises (OP) - 07/18/17 0001      RUE Fluidotherapy   Number Minutes Fluidotherapy  10 Minutes    RUE Fluidotherapy Location  Hand    Comments  at [facility] prior to manual and ROM to decrease pain       Soft tissue mobs done to [facility] spreads , CMC and MP of thumb massage and gentle traction  Tender in webspace of thumb - but tolerate gentle sweeping of Graston tool  nr 2 in webspace and brushing over thenar eminence   Tendon glides - done  Opposition to all digit to 4th no pain - still some pain to 5th but close  Add and review thumb PA and RA - AROM   Review again joint protection - pt able to verbalize and starting to use some of if and CMC neoprene splint she use with taking out wet laundry and sweeping - with less pain        OT Education - 07/18/17 1424    Education provided  Yes    Education Details  HEP review and add PA and RA of thumb AROM     Person(s) Educated  Patient    Methods  Explanation;Demonstration;Tactile cues;Handout    Comprehension  Verbalized understanding;Need further instruction;Returned demonstration       OT Short Term Goals - 07/11/17 1445      OT SHORT TERM GOAL #1   Title  Pain on PRWHE Iimprove by more than 15 points     Baseline  PRWHE at eval for pain 29/50    Time  3    Period  Weeks    Status  New    Target Date  08/01/17      OT SHORT TERM GOAL #2   Title  Pt to be in in HEP for joint protection and ROM to increase thumb ROM to touching opposition to 5th and RA  improve with more tha 5 degrees     Baseline  see flowsheet- pt keep thumb in ADD - decrease in all planes     Time  3    Period  Weeks    Status  New    Target Date  08/01/17        OT Long Term Goals - 07/11/17 1447      OT LONG TERM GOAL #1   Title  Pt to verbalize 3 joint protection and AE to decrease pain and report increase ease of Use of R hand in ADL's and IADl's     Baseline  using larger jionts - but none other adaptations -     Time  6    Period  Weeks    Status  New    Target Date  08/22/17      OT LONG TERM GOAL #2   Title  Grip strength in R improve with 5 lbs  to increase use of R hand     Baseline  Grip R 27 and L 39 lbs     Time  6    Period  Weeks    Status  New    Target Date  08/22/17            Plan - 07/18/17 1425    Clinical Impression Statement  Pt show increase R thumb AROM in all planes and grip strength- prehension same and painfull - pt report less pain in MP of thumb - but at [facility] tender and in webspace tendre - still want to guard and keep thumb ADD - pt to cont with homeprogram and joint protection     Occupational performance deficits (Please refer to evaluation for details):  ADL's;IADL's;Play;Leisure;Social Participation    Rehab Potential  Fair    OT Frequency  1x / week    OT Duration  4 weeks    OT Treatment/Interventions  Self-care/ADL training;Fluidtherapy;Splinting;Patient/family education;Contrast Bath;Therapeutic exercises;Ultrasound;Parrafin;Manual Therapy    Plan  assess progress with Homeprogram the last 2 wks  Clinical Decision Making  Several treatment options, min-mod task modification necessary    OT Home Exercise Plan  see pt instruction     Consulted and Agree with Plan of Care  Patient       Patient will benefit from skilled therapeutic intervention in order to improve the following deficits and impairments:  Pain, Impaired flexibility, Decreased knowledge of use of DME, Decreased coordination, Decreased strength,  Decreased range of motion, Impaired UE functional use  Visit Diagnosis: Pain in right hand  Stiffness of right hand, not elsewhere classified  Muscle weakness (generalized)  Stiffness of left hip, not elsewhere classified    Problem List Patient Active Problem List   Diagnosis Date Noted  . Pain of right thumb 05/25/2017  . Fall 02/14/2017  . Neuropathy 05/17/2016  . Left hip pain 01/15/2016  . URI (upper respiratory infection) 08/09/2015  . Vaginal irritation 05/23/2015  . Health care maintenance 01/19/2015  . Dizziness 01/16/2015  . Obesity (BMI 30-39.9) 09/14/2014  . Hypercholesterolemia 08/13/2012  . Thyroid nodule 08/13/2012  . Osteopenia 08/13/2012  . Myelodysplastic syndrome ([place]) 06/04/2012  . Hypertension 06/04/2012  . Diabetes mellitus ([place]) 06/04/2012  . Environmental allergies 06/04/2012    Rosalyn Gess OTR/L,CLT 07/18/2017, 2:28 PM  [facility] [address] [place], [address] Phone: [phone]   Fax:  [phone]  Name: Russie Gulledge MRN: [id number] Date of Birth: Jan 13, 1936

## 2017-07-25 ENCOUNTER — Other Ambulatory Visit: Payer: Self-pay | Admitting: Internal Medicine

## 2017-07-30 ENCOUNTER — Other Ambulatory Visit: Payer: Self-pay | Admitting: Internal Medicine

## 2017-07-31 ENCOUNTER — Other Ambulatory Visit: Payer: Self-pay | Admitting: Internal Medicine

## 2017-07-31 ENCOUNTER — Ambulatory Visit: Payer: Medicare Other | Admitting: Occupational Therapy

## 2017-07-31 DIAGNOSIS — M79641 Pain in right hand: Secondary | ICD-10-CM

## 2017-07-31 DIAGNOSIS — M25641 Stiffness of right hand, not elsewhere classified: Secondary | ICD-10-CM

## 2017-07-31 DIAGNOSIS — M6281 Muscle weakness (generalized): Secondary | ICD-10-CM

## 2017-07-31 NOTE — Therapy (Signed)
[facility] [address] [place], [address] Phone: [phone]   Fax:  [phone]  Occupational Therapy Treatment/discharge  Patient Details  Name: Kara Griffin MRN: [id number] Date of Birth: 27-Nov-1935 Referring Provider: Meda Coffee   Encounter Date: 07/31/2017  OT End of Session - 07/31/17 1504    Visit Number  3    Number of Visits  3    Date for OT Re-Evaluation  07/31/17    OT Start Time  1408    OT Stop Time  1448    OT Time Calculation (min)  40 min    Activity Tolerance  Patient tolerated treatment well    Behavior During Therapy  [facility] for tasks assessed/performed       Past Medical History:  Diagnosis Date  . Anemia   . Diabetes mellitus ([place])   . Environmental allergies   . Hypercholesterolemia   . Hypertension   . Myelodysplastic syndrome ([place])    Sees Dr Jerrye Noble    Past Surgical History:  Procedure Laterality Date  . ABDOMINAL HYSTERECTOMY    . [place]  . DILATION AND CURETTAGE OF UTERUS    . TONSILECTOMY, ADENOIDECTOMY, BILATERAL MYRINGOTOMY AND TUBES  1963  . TOTAL ABDOMINAL HYSTERECTOMY W/ BILATERAL SALPINGOOPHORECTOMY  1985   secondary to abnormal cells and abnormal uterine bleeding    There were no vitals filed for this visit.  Subjective Assessment - 07/31/17 1500    Subjective   Doing better- I used my hand a lot in cooking and cleaning for thanksgiving and it did not bother me to much - using the black soft splint like with wet laundry     Patient Stated Goals  Have no pain in my thumb and use it more without pain - like writing better, cutting , lifting , brushing teeth , driving ,     Currently in Pain?  Yes    Pain Score  1     Pain Location  Finger (Comment which one)    Pain Orientation  Right    Pain Descriptors / Indicators  Aching         [facility] OT Assessment - 07/31/17 0001      Strength   Right Hand Grip (lbs)  40    Right  Hand Lateral Pinch  9 lbs    Right Hand 3 Point Pinch  7 lbs      Right Hand AROM   R Thumb MCP 0-60  45 Degrees    R Thumb IP 0-80  60 Degrees    R Thumb Radial ABduction/ADduction 0-55  43    R Thumb Palmar ABduction/ADduction 0-45  53    R Thumb Opposition to Index  -- pain free upt to 4th - unable to do 5th           Remeasured ROM and grip  No pain with prehension this date      OT Treatments/Exercises (OP) - 07/31/17 0001      RUE Paraffin   Number Minutes Paraffin  10 Minutes    RUE Paraffin Location  Hand    Comments  at [facility] to decrease pain and increase ROM at thumb       soft tissue mobs done to webspace , and PA and RA of thumb stretch  Lateral bands of MP , IP and CMC-  Carpal speads done   review HEP for tendon glides, thumb PA and RA ,  and opposition   to cont with  [place] at 3.3MHZ, 20%, at 1.0 intensity for 6 min in webspace volar and dorsal end  Review splint wearing with pt and joint protection to cont with        OT Education - 07/31/17 1503    Education provided  Yes    Education Details  discharge instructions review and HEP     Person(s) Educated  Patient    Methods  Explanation;Demonstration;Tactile cues    Comprehension  Verbalized understanding;Need further instruction;Returned demonstration       OT Short Term Goals - 07/31/17 1506      OT SHORT TERM GOAL #1   Title  Pain on PRWHE Iimprove by more than 15 points     Baseline  PRWHE at eval for pain 29/50 and now 14/50    Status  Achieved      OT SHORT TERM GOAL #2   Title  Pt to be in in HEP for joint protection and ROM to increase thumb ROM to touching opposition to 5th and RA improve with more tha 5 degrees     Baseline  see flowsheet    Status  Achieved        OT Long Term Goals - 07/31/17 1507      OT LONG TERM GOAL #1   Title  Pt to verbalize 3 joint protection and AE to decrease pain and report increase ease of Use of R hand in ADL's and IADl's     Status  Achieved       OT LONG TERM GOAL #2   Title  Grip strength in R improve with 5 lbs  to increase use of R hand     Baseline   grip in R hand increase from 27 to 40 lbs     Status  Achieved            Plan - 07/31/17 1504    Clinical Impression Statement  Pt showed from [facility] decrease pain , increase AROM in thumb in all planes and increase grip strength - prehension strenght same but less pain - pt discharge with homeprogram     OT Treatment/Interventions  Self-care/ADL training;Fluidtherapy;Splinting;Patient/family education;Contrast Bath;Therapeutic exercises;Ultrasound;Parrafin;Manual Therapy    Plan  discharge with homeprogram     OT Home Exercise Plan  see pt instruction     Consulted and Agree with Plan of Care  Patient       Patient will benefit from skilled therapeutic intervention in order to improve the following deficits and impairments:     Visit Diagnosis: Pain in right hand  Stiffness of right hand, not elsewhere classified  Muscle weakness (generalized)    Problem List Patient Active Problem List   Diagnosis Date Noted  . Pain of right thumb 05/25/2017  . Fall 02/14/2017  . Neuropathy 05/17/2016  . Left hip pain 01/15/2016  . URI (upper respiratory infection) 08/09/2015  . Vaginal irritation 05/23/2015  . Health care maintenance 01/19/2015  . Dizziness 01/16/2015  . Obesity (BMI 30-39.9) 09/14/2014  . Hypercholesterolemia 08/13/2012  . Thyroid nodule 08/13/2012  . Osteopenia 08/13/2012  . Myelodysplastic syndrome ([place]) 06/04/2012  . Hypertension 06/04/2012  . Diabetes mellitus ([place]) 06/04/2012  . Environmental allergies 06/04/2012    Rosalyn Gess OTR/L,CLT  07/31/2017, 3:10 PM  [facility] [address], [place], [address] Phone: [phone]   Fax:  [phone]  Name: Naziah Portee MRN: [id number] Date of  Birth: Apr 06, 1936

## 2017-07-31 NOTE — Patient Instructions (Signed)
Cont with AROM for thumb , tendon glides   heat prior  Joint protection and AE  CMC neoprene splint with activities that cause pain at thumb

## 2017-08-10 ENCOUNTER — Ambulatory Visit: Payer: Medicare Other | Admitting: Internal Medicine

## 2017-08-10 ENCOUNTER — Encounter: Payer: Self-pay | Admitting: Internal Medicine

## 2017-08-10 VITALS — BP 120/62 | HR 64 | Temp 97.7°F | Resp 18 | Ht 61.0 in | Wt 176.2 lb

## 2017-08-10 DIAGNOSIS — I10 Essential (primary) hypertension: Secondary | ICD-10-CM | POA: Diagnosis not present

## 2017-08-10 DIAGNOSIS — D469 Myelodysplastic syndrome, unspecified: Secondary | ICD-10-CM

## 2017-08-10 DIAGNOSIS — E7800 Pure hypercholesterolemia, unspecified: Secondary | ICD-10-CM | POA: Diagnosis not present

## 2017-08-10 DIAGNOSIS — M79644 Pain in right finger(s): Secondary | ICD-10-CM | POA: Diagnosis not present

## 2017-08-10 DIAGNOSIS — E669 Obesity, unspecified: Secondary | ICD-10-CM | POA: Diagnosis not present

## 2017-08-10 DIAGNOSIS — E119 Type 2 diabetes mellitus without complications: Secondary | ICD-10-CM

## 2017-08-10 LAB — POCT GLYCOSYLATED HEMOGLOBIN (HGB A1C): Hemoglobin A1C: 6.2

## 2017-08-10 NOTE — Patient Instructions (Signed)
prevnar - (new pneumonia shot)

## 2017-08-10 NOTE — Progress Notes (Signed)
Patient ID: Kara Griffin, female   DOB: [birth year], 81 y.o.   MRN: [id number]   Subjective:    Patient ID: Kara Griffin, female    DOB: Dec 02, 1935, 81 y.o.   MRN: [id number]  HPI  Patient here for a scheduled follow up.  She reports she is doing better.  Feels better.  Sugars doing better.  Reviewed attached list of sugars.  Trying to stay active.  No chest pain.  No sob.  No acid reflux.  No abdominal pain.  Bowels moving.  Discussed immunization.  Pain at the base of her thumb.  Going to therapy.     Past Medical History:  Diagnosis Date  . Anemia   . Diabetes mellitus ([place])   . Environmental allergies   . Hypercholesterolemia   . Hypertension   . Myelodysplastic syndrome ([place])    Sees Dr Jerrye Noble   Past Surgical History:  Procedure Laterality Date  . ABDOMINAL HYSTERECTOMY    . [place]  . DILATION AND CURETTAGE OF UTERUS    . TONSILECTOMY, ADENOIDECTOMY, BILATERAL MYRINGOTOMY AND TUBES  1963  . TOTAL ABDOMINAL HYSTERECTOMY W/ BILATERAL SALPINGOOPHORECTOMY  1985   secondary to abnormal cells and abnormal uterine bleeding   Family History  Problem Relation Age of Onset  . Heart disease Father        Deceased (MI) - 31  . Diabetes Mother        Deceased  . Thyroid cancer Mother   . Osteoarthritis Mother   . Asthma Unknown   . Diabetes Brother   . Heart disease Maternal Grandmother        myocardial infarction - 59  . Asthma Maternal Grandmother   . Heart disease Paternal Grandmother        myocardial infarction-52  . Heart disease Brother   . Sinusitis Brother   . Depression Son   . Diabetes Son   . Depression Son   . Diabetes Son   . Depression Son   . Breast cancer Neg Hx    Social History   Socioeconomic History  . Marital status: Divorced    Spouse name: None  . Number of children: 3  . Years of education: None  . Highest education level: None  Social Needs  . Financial resource strain: None  . Food insecurity -  worry: None  . Food insecurity - inability: None  . Transportation needs - medical: None  . Transportation needs - non-medical: None  Occupational History  . Occupation: retired Games developer  Tobacco Use  . Smoking status: Never Smoker  . Smokeless tobacco: Never Used  Substance and Sexual Activity  . Alcohol use: No    Alcohol/week: 0.0 oz  . Drug use: No  . Sexual activity: No  Other Topics Concern  . None  Social History Narrative  . None    Outpatient Encounter Medications as of 08/10/2017  Medication Sig  . aspirin 81 MG tablet Take 81 mg by mouth daily.  . Calcium Carbonate-Vitamin D (CALCIUM 600+D) 600-400 MG-UNIT per tablet Take 1 tablet by mouth daily. Reported on 01/15/2016  . cyanocobalamin 500 MCG tablet Take 1,000 mcg by mouth daily.   . cyclobenzaprine (FLEXERIL) 5 MG tablet Take 1 tablet (5 mg total) by mouth 3 (three) times daily as needed for muscle spasms.  . fluticasone (FLONASE) 50 MCG/ACT nasal spray Place 2 sprays into both nostrils daily.  Marland Kitchen HYDROcodone-acetaminophen (NORCO/VICODIN) 5-325 MG tablet 1 tab po q 8 hours  prn  . JANUVIA 100 MG tablet TAKE 1 TABLET(100 MG) BY MOUTH DAILY  . lenalidomide (REVLIMID) 10 MG capsule Take 10 mg by mouth daily.  Marland Kitchen losartan (COZAAR) 50 MG tablet TAKE 1 TABLET BY MOUTH EVERY DAY  . metFORMIN (GLUCOPHAGE) 1000 MG tablet TAKE 1 TABLET BY MOUTH TWICE DAILY  . Multiple Vitamin (MULTIVITAMIN) tablet Take 1 tablet by mouth daily.  Marland Kitchen nystatin cream (MYCOSTATIN) Apply 1 application topically 2 (two) times daily. (Patient taking differently: Apply 1 application topically 2 (two) times daily as needed. )  . ONE TOUCH LANCETS MISC Use to check sugars 1-2 times a day Dx. E11.9 (One Touch Ultra Mini)  . ONE TOUCH ULTRA TEST test strip TEST BLOOD SUGAR TWICE DAILY AS DIRECTED  . [DISCONTINUED] fish oil-omega-3 fatty acids 1000 MG capsule Take 1 g by mouth daily.    No facility-administered encounter medications on file as of 08/10/2017.      Review of Systems  Constitutional: Negative for appetite change and unexpected weight change.  HENT: Negative for congestion and sinus pressure.   Respiratory: Negative for cough, chest tightness and shortness of breath.   Cardiovascular: Negative for chest pain, palpitations and leg swelling.  Gastrointestinal: Negative for abdominal pain, diarrhea, nausea and vomiting.  Genitourinary: Negative for difficulty urinating and dysuria.  Musculoskeletal: Negative for myalgias.       Pain base of thumb.    Skin: Negative for color change and rash.  Neurological: Negative for dizziness, light-headedness and headaches.  Psychiatric/Behavioral: Negative for agitation and dysphoric mood.       Objective:     Blood pressure rechecked by me:  120/62  Physical Exam  Constitutional: She appears well-developed and well-nourished. No distress.  HENT:  Nose: Nose normal.  Mouth/Throat: Oropharynx is clear and moist.  Neck: Neck supple. No thyromegaly present.  Cardiovascular: Normal rate and regular rhythm.  Pulmonary/Chest: Breath sounds normal. No respiratory distress. She has no wheezes.  Abdominal: Soft. Bowel sounds are normal. There is no tenderness.  Musculoskeletal: She exhibits no edema or tenderness.  Lymphadenopathy:    She has no cervical adenopathy.  Skin: No rash noted. No erythema.  Psychiatric: She has a normal mood and affect. Her behavior is normal.    BP 120/62   Pulse 64   Temp 97.7 F (36.5 C)   Resp 18   Ht 5\' 1"  (1.549 m)   Wt 176 lb 4 oz (79.9 kg)   LMP 08/13/1984   SpO2 98%   BMI 33.30 kg/m  Wt Readings from Last 3 Encounters:  08/10/17 176 lb 4 oz (79.9 kg)  06/29/17 176 lb 12.8 oz (80.2 kg)  06/26/17 175 lb 9.6 oz (79.7 kg)     Lab Results  Component Value Date   WBC 4.2 06/10/2014   HGB 13.5 06/10/2014   HCT 40 06/10/2014   PLT 133 (A) 06/10/2014   GLUCOSE 172 (H) 01/15/2016   CHOL 141 02/20/2017   TRIG 227 (H) 02/20/2017   HDL 44  02/20/2017   LDLDIRECT 85.3 08/23/2013   LDLCALC 52 02/20/2017   ALT 23 10/05/2015   AST 17 10/05/2015   NA 139 01/15/2016   K 3.9 01/15/2016   CL 106 01/15/2016   CREATININE 0.64 01/15/2016   BUN 18 01/15/2016   CO2 24 01/15/2016   TSH 1.586 02/20/2017   HGBA1C 6.2 08/10/2017   MICROALBUR 1.0 01/16/2015       Assessment & Plan:   Problem List Items Addressed This Visit  Diabetes mellitus ([place]) - Primary   Relevant Orders   POCT HgB A1C (Completed)   Hypercholesterolemia    Low cholesterol diet and exercise.  Follow lipid panel and liver function tests.        Hypertension    Blood pressure under good control.  Continue same medication regimen.  Follow pressures.  Follow metabolic panel.        Myelodysplastic syndrome ([place])    Followed by hematology - Dr Florene Glen.  Stable.        Obesity (BMI 30-39.9)    Discussed diet and exercise.        Pain of right thumb    Undergoing therapy.            Einar Pheasant, MD

## 2017-08-13 ENCOUNTER — Encounter: Payer: Self-pay | Admitting: Internal Medicine

## 2017-08-13 NOTE — Assessment & Plan Note (Signed)
Low cholesterol diet and exercise.  Follow lipid panel and liver function tests.  

## 2017-08-13 NOTE — Assessment & Plan Note (Signed)
Undergoing therapy.

## 2017-08-13 NOTE — Assessment & Plan Note (Signed)
Blood pressure under good control.  Continue same medication regimen.  Follow pressures.  Follow metabolic panel.   

## 2017-08-13 NOTE — Assessment & Plan Note (Signed)
Discussed diet and exercise 

## 2017-08-13 NOTE — Assessment & Plan Note (Signed)
Followed by hematology - Dr Powell.  Stable.   

## 2017-08-31 ENCOUNTER — Other Ambulatory Visit: Payer: Self-pay | Admitting: Internal Medicine

## 2017-10-01 ENCOUNTER — Other Ambulatory Visit: Payer: Self-pay | Admitting: Internal Medicine

## 2017-10-07 ENCOUNTER — Other Ambulatory Visit: Payer: Self-pay | Admitting: Internal Medicine

## 2017-10-11 ENCOUNTER — Other Ambulatory Visit: Payer: Self-pay | Admitting: Internal Medicine

## 2017-11-04 ENCOUNTER — Other Ambulatory Visit: Payer: Self-pay | Admitting: Internal Medicine

## 2017-11-09 ENCOUNTER — Other Ambulatory Visit: Payer: Self-pay | Admitting: Internal Medicine

## 2017-11-13 ENCOUNTER — Other Ambulatory Visit: Payer: Self-pay | Admitting: Internal Medicine

## 2017-11-20 ENCOUNTER — Encounter: Payer: Self-pay | Admitting: Internal Medicine

## 2017-11-20 ENCOUNTER — Ambulatory Visit (INDEPENDENT_AMBULATORY_CARE_PROVIDER_SITE_OTHER): Payer: Medicare Other | Admitting: Internal Medicine

## 2017-11-20 VITALS — BP 126/66 | HR 76 | Temp 97.7°F | Ht 61.0 in | Wt 177.4 lb

## 2017-11-20 DIAGNOSIS — E041 Nontoxic single thyroid nodule: Secondary | ICD-10-CM

## 2017-11-20 DIAGNOSIS — Z1239 Encounter for other screening for malignant neoplasm of breast: Secondary | ICD-10-CM

## 2017-11-20 DIAGNOSIS — Z9109 Other allergy status, other than to drugs and biological substances: Secondary | ICD-10-CM | POA: Diagnosis not present

## 2017-11-20 DIAGNOSIS — Z0000 Encounter for general adult medical examination without abnormal findings: Secondary | ICD-10-CM | POA: Diagnosis not present

## 2017-11-20 DIAGNOSIS — E7800 Pure hypercholesterolemia, unspecified: Secondary | ICD-10-CM

## 2017-11-20 DIAGNOSIS — E119 Type 2 diabetes mellitus without complications: Secondary | ICD-10-CM | POA: Diagnosis not present

## 2017-11-20 DIAGNOSIS — I10 Essential (primary) hypertension: Secondary | ICD-10-CM

## 2017-11-20 DIAGNOSIS — D469 Myelodysplastic syndrome, unspecified: Secondary | ICD-10-CM

## 2017-11-20 DIAGNOSIS — Z6833 Body mass index (BMI) 33.0-33.9, adult: Secondary | ICD-10-CM

## 2017-11-20 DIAGNOSIS — Z1231 Encounter for screening mammogram for malignant neoplasm of breast: Secondary | ICD-10-CM

## 2017-11-20 MED ORDER — SITAGLIPTIN PHOSPHATE 100 MG PO TABS: ORAL_TABLET | ORAL | 5 refills | Status: DC

## 2017-11-20 MED ORDER — LOSARTAN POTASSIUM 25 MG PO TABS: ORAL_TABLET | ORAL | 5 refills | Status: DC

## 2017-11-20 NOTE — Progress Notes (Signed)
Pre visit review using our clinic review tool, if applicable. No additional management support is needed unless otherwise documented below in the visit note. 

## 2017-11-20 NOTE — Assessment & Plan Note (Addendum)
Physical today 11/20/17.  Mammogram 10/17/16 - Birads I.  cologuard negative 10/07/15.  Scheduled for f/u mammogram.

## 2017-11-20 NOTE — Progress Notes (Signed)
Patient ID: Kara Griffin, female   DOB: 18-Mar-1936, 82 y.o.   MRN: [id number]   Subjective:    Patient ID: Kara Griffin, female    DOB: Oct 26, 1935, 82 y.o.   MRN: [id number]  HPI  Patient here for her physical exam.  Has a history of MDS.  On revlimid.  Noted hgb slightly decreased.  Sees Dr Florene Glen.  Felt stable.  Recommended f/u in 3 months.  She reports she is doing relatively well.  Tries to stay active.  No chest pain.  Breathing stable.  No acid reflux. No abdominal pain.  Bowels moving.  Overall feels good.  Sugars better.  Outside checks reviewed.  Blood sugars averaging 130-160s.     Past Medical History:  Diagnosis Date  . Anemia   . Diabetes mellitus ([place])   . Environmental allergies   . Hypercholesterolemia   . Hypertension   . Myelodysplastic syndrome ([place])    Sees Dr Jerrye Noble   Past Surgical History:  Procedure Laterality Date  . ABDOMINAL HYSTERECTOMY    . [place]  . DILATION AND CURETTAGE OF UTERUS    . TONSILECTOMY, ADENOIDECTOMY, BILATERAL MYRINGOTOMY AND TUBES  1963  . TOTAL ABDOMINAL HYSTERECTOMY W/ BILATERAL SALPINGOOPHORECTOMY  1985   secondary to abnormal cells and abnormal uterine bleeding   Family History  Problem Relation Age of Onset  . Heart disease Father        Deceased (MI) - 68  . Diabetes Mother        Deceased  . Thyroid cancer Mother   . Osteoarthritis Mother   . Asthma Unknown   . Diabetes Brother   . Heart disease Maternal Grandmother        myocardial infarction - 38  . Asthma Maternal Grandmother   . Heart disease Paternal Grandmother        myocardial infarction-52  . Heart disease Brother   . Sinusitis Brother   . Depression Son   . Diabetes Son   . Depression Son   . Diabetes Son   . Depression Son   . Breast cancer Neg Hx    Social History   Socioeconomic History  . Marital status: Divorced    Spouse name: Not on file  . Number of children: 3  . Years of education: Not on file    . Highest education level: Not on file  Occupational History  . Occupation: retired Games developer  Social Needs  . Financial resource strain: Not on file  . Food insecurity:    Worry: Not on file    Inability: Not on file  . Transportation needs:    Medical: Not on file    Non-medical: Not on file  Tobacco Use  . Smoking status: Never Smoker  . Smokeless tobacco: Never Used  Substance and Sexual Activity  . Alcohol use: No    Alcohol/week: 0.0 oz  . Drug use: No  . Sexual activity: Never  Lifestyle  . Physical activity:    Days per week: Not on file    Minutes per session: Not on file  . Stress: Not on file  Relationships  . Social connections:    Talks on phone: Not on file    Gets together: Not on file    Attends religious service: Not on file    Active member of club or organization: Not on file    Attends meetings of clubs or organizations: Not on file    Relationship status: Not  on file  Other Topics Concern  . Not on file  Social History Narrative  . Not on file    Outpatient Encounter Medications as of 11/20/2017  Medication Sig  . aspirin 81 MG tablet Take 81 mg by mouth daily.  . Calcium Carbonate-Vitamin D (CALCIUM 600+D) 600-400 MG-UNIT per tablet Take 1 tablet by mouth daily. Reported on 01/15/2016  . cyanocobalamin 500 MCG tablet Take 1,000 mcg by mouth daily.   . fluticasone (FLONASE) 50 MCG/ACT nasal spray Place 2 sprays into both nostrils daily.  Marland Kitchen lenalidomide (REVLIMID) 10 MG capsule Take 10 mg by mouth daily.  . metFORMIN (GLUCOPHAGE) 1000 MG tablet TAKE 1 TABLET BY MOUTH TWICE DAILY  . Multiple Vitamin (MULTIVITAMIN) tablet Take 1 tablet by mouth daily.  Marland Kitchen nystatin cream (MYCOSTATIN) Apply 1 application topically 2 (two) times daily. (Patient taking differently: Apply 1 application topically 2 (two) times daily as needed. )  . ONE TOUCH LANCETS MISC Use to check sugars 1-2 times a day Dx. E11.9 (One Touch Ultra Mini)  . ONE TOUCH ULTRA TEST test strip  TEST BLOOD SUGAR TWICE DAILY AS DIRECTED  . sitaGLIPtin (JANUVIA) 100 MG tablet TAKE 1 TABLET(100 MG) BY MOUTH DAILY  . [DISCONTINUED] JANUVIA 100 MG tablet TAKE 1 TABLET(100 MG) BY MOUTH DAILY  . [DISCONTINUED] losartan (COZAAR) 50 MG tablet TAKE 1 TABLET BY MOUTH EVERY DAY  . cyclobenzaprine (FLEXERIL) 5 MG tablet Take 1 tablet (5 mg total) by mouth 3 (three) times daily as needed for muscle spasms. (Patient not taking: Reported on 11/20/2017)  . HYDROcodone-acetaminophen (NORCO/VICODIN) 5-325 MG tablet 1 tab po q 8 hours prn (Patient not taking: Reported on 11/20/2017)  . losartan (COZAAR) 25 MG tablet Take 1 tablet bid   No facility-administered encounter medications on file as of 11/20/2017.     Review of Systems  Constitutional: Negative for appetite change and unexpected weight change.  HENT: Negative for congestion and sinus pressure.   Eyes: Negative for pain and visual disturbance.  Respiratory: Negative for cough, chest tightness and shortness of breath.   Cardiovascular: Negative for chest pain, palpitations and leg swelling.  Gastrointestinal: Negative for abdominal pain, diarrhea, nausea and vomiting.  Genitourinary: Negative for difficulty urinating and dysuria.  Musculoskeletal: Negative for joint swelling and myalgias.  Skin: Negative for color change and rash.  Neurological: Negative for dizziness, light-headedness and headaches.  Hematological: Negative for adenopathy. Does not bruise/bleed easily.  Psychiatric/Behavioral: Negative for agitation and dysphoric mood.       Objective:    Physical Exam  Constitutional: She is oriented to person, place, and time. She appears well-developed and well-nourished. No distress.  HENT:  Nose: Nose normal.  Mouth/Throat: Oropharynx is clear and moist.  Eyes: Right eye exhibits no discharge. Left eye exhibits no discharge. No scleral icterus.  Neck: Neck supple. No thyromegaly present.  Cardiovascular: Normal rate and regular  rhythm.  Pulmonary/Chest: Breath sounds normal. No accessory muscle usage. No tachypnea. No respiratory distress. She has no decreased breath sounds. She has no wheezes. She has no rhonchi. Right breast exhibits no inverted nipple, no mass, no nipple discharge and no tenderness (no axillary adenopathy). Left breast exhibits no inverted nipple, no mass, no nipple discharge and no tenderness (no axilarry adenopathy).  Abdominal: Soft. Bowel sounds are normal. There is no tenderness.  Musculoskeletal: She exhibits no edema or tenderness.  Lymphadenopathy:    She has no cervical adenopathy.  Neurological: She is alert and oriented to person, place, and time.  Skin: Skin is warm. No rash noted. No erythema.  Psychiatric: She has a normal mood and affect. Her behavior is normal.    BP 126/66   Pulse 76   Temp 97.7 F (36.5 C) (Oral)   Ht 5' 1"  (1.549 m)   Wt 177 lb 6.4 oz (80.5 kg)   LMP 08/13/1984   SpO2 96%   BMI 33.52 kg/m  Wt Readings from Last 3 Encounters:  11/20/17 177 lb 6.4 oz (80.5 kg)  08/10/17 176 lb 4 oz (79.9 kg)  06/29/17 176 lb 12.8 oz (80.2 kg)     Lab Results  Component Value Date   WBC 4.2 06/10/2014   HGB 13.5 06/10/2014   HCT 40 06/10/2014   PLT 133 (A) 06/10/2014   GLUCOSE 172 (H) 01/15/2016   CHOL 141 02/20/2017   TRIG 227 (H) 02/20/2017   HDL 44 02/20/2017   LDLDIRECT 85.3 08/23/2013   LDLCALC 52 02/20/2017   ALT 23 10/05/2015   AST 17 10/05/2015   NA 139 01/15/2016   K 3.9 01/15/2016   CL 106 01/15/2016   CREATININE 0.64 01/15/2016   BUN 18 01/15/2016   CO2 24 01/15/2016   TSH 1.586 02/20/2017   HGBA1C 6.2 08/10/2017   MICROALBUR 1.0 01/16/2015       Assessment & Plan:   Problem List Items Addressed This Visit    BMI 33.0-33.9,adult    Discussed diet and exercise.  Follow.        Diabetes mellitus ([place])    Low carb diet and exercise.  Follow met b and a1c.  Sugars overall appear to be improved.  Will have a1c checked with labs tomorrow.         Relevant Medications   sitaGLIPtin (JANUVIA) 100 MG tablet   losartan (COZAAR) 25 MG tablet   Environmental allergies    Controlled.       Health care maintenance    Physical today 11/20/17.  Mammogram 10/17/16 - Birads I.  cologuard negative 10/07/15.  Scheduled for f/u mammogram.        Hypercholesterolemia    Low cholesterol diet and exercise.  Follow lipid panel.        Relevant Medications   losartan (COZAAR) 25 MG tablet   Hypertension    Blood pressure under good control.  Continue same medication regimen.  Follow pressures.  Follow metabolic panel.        Relevant Medications   losartan (COZAAR) 25 MG tablet   Myelodysplastic syndrome ([facility])    Followed by Dr Florene Glen - hematology.  hgb slightly decreased.  Follow.       Thyroid nodule    Worked up by Dr Harlow Asa.  Follow tsh.        Other Visit Diagnoses    Breast cancer screening    -  Primary   Relevant Orders   MM DIGITAL SCREENING BILATERAL       Einar Pheasant, MD

## 2017-11-23 ENCOUNTER — Encounter: Payer: Self-pay | Admitting: Internal Medicine

## 2017-11-23 NOTE — Assessment & Plan Note (Signed)
Low carb diet and exercise.  Follow met b and a1c.  Sugars overall appear to be improved.  Will have a1c checked with labs tomorrow.

## 2017-11-23 NOTE — Assessment & Plan Note (Signed)
Blood pressure under good control.  Continue same medication regimen.  Follow pressures.  Follow metabolic panel.   

## 2017-11-23 NOTE — Assessment & Plan Note (Signed)
Controlled.  

## 2017-11-23 NOTE — Assessment & Plan Note (Signed)
Discussed diet and exercise.  Follow.  

## 2017-11-23 NOTE — Assessment & Plan Note (Signed)
Worked up by Dr Gerkin.  Follow tsh.  

## 2017-11-23 NOTE — Assessment & Plan Note (Signed)
Low cholesterol diet and exercise.  Follow lipid panel.   

## 2017-11-23 NOTE — Assessment & Plan Note (Signed)
Followed by Dr Florene Glen - hematology.  hgb slightly decreased.  Follow.

## 2017-12-07 ENCOUNTER — Other Ambulatory Visit: Payer: Self-pay | Admitting: Internal Medicine

## 2017-12-11 ENCOUNTER — Ambulatory Visit: Payer: Medicare Other

## 2017-12-13 ENCOUNTER — Ambulatory Visit
Admission: RE | Admit: 2017-12-13 | Discharge: 2017-12-13 | Disposition: A | Discharge status: Home / Self Care | Payer: Medicare Other | Source: Ambulatory Visit | Attending: Internal Medicine | Admitting: Internal Medicine

## 2017-12-13 DIAGNOSIS — Z1231 Encounter for screening mammogram for malignant neoplasm of breast: Secondary | ICD-10-CM | POA: Insufficient documentation

## 2017-12-13 DIAGNOSIS — Z1239 Encounter for other screening for malignant neoplasm of breast: Secondary | ICD-10-CM

## 2017-12-19 LAB — BASIC METABOLIC PANEL
BUN: 13 (ref 4–21)
CREATININE: 0.7 (ref 0.5–1.1)
GLUCOSE: 127
Potassium: 3.5 (ref 3.4–5.3)
Sodium: 139 (ref 137–147)

## 2017-12-19 LAB — HEPATIC FUNCTION PANEL
ALK PHOS: 72 (ref 25–125)
ALT: 17 (ref 7–35)
AST: 13 (ref 13–35)
BILIRUBIN, TOTAL: 0.5

## 2017-12-19 LAB — CBC AND DIFFERENTIAL
HEMATOCRIT: 37 (ref 36–46)
Hemoglobin: 12.6 (ref 12.0–16.0)
PLATELETS: 139 — AB (ref 150–399)
WBC: 3.7

## 2017-12-26 ENCOUNTER — Encounter: Payer: Self-pay | Admitting: Internal Medicine

## 2018-01-08 ENCOUNTER — Ambulatory Visit
Admission: EM | Admit: 2018-01-08 | Discharge: 2018-01-08 | Disposition: A | Discharge status: Home / Self Care | Payer: Medicare Other | Attending: Family Medicine | Admitting: Family Medicine

## 2018-01-08 ENCOUNTER — Other Ambulatory Visit: Payer: Self-pay

## 2018-01-08 ENCOUNTER — Ambulatory Visit (INDEPENDENT_AMBULATORY_CARE_PROVIDER_SITE_OTHER): Payer: Medicare Other

## 2018-01-08 ENCOUNTER — Encounter: Payer: Self-pay | Admitting: Emergency Medicine

## 2018-01-08 DIAGNOSIS — M79642 Pain in left hand: Secondary | ICD-10-CM

## 2018-01-08 DIAGNOSIS — L03114 Cellulitis of left upper limb: Secondary | ICD-10-CM

## 2018-01-08 LAB — CBC WITH DIFFERENTIAL/PLATELET
Basophils Absolute: 0.1 10*3/uL (ref 0–0.1)
Basophils Relative: 1 %
Eosinophils Absolute: 0 10*3/uL (ref 0–0.7)
Eosinophils Relative: 1 %
HCT: 37.8 % (ref 35.0–47.0)
Hemoglobin: 12.8 g/dL (ref 12.0–16.0)
Lymphocytes Relative: 12 %
Lymphs Abs: 0.6 10*3/uL — ABNORMAL LOW (ref 1.0–3.6)
MCH: 30.6 pg (ref 26.0–34.0)
MCHC: 33.8 g/dL (ref 32.0–36.0)
MCV: 90.5 fL (ref 80.0–100.0)
Monocytes Absolute: 1.1 10*3/uL — ABNORMAL HIGH (ref 0.2–0.9)
Monocytes Relative: 22 %
Neutro Abs: 3.3 10*3/uL (ref 1.4–6.5)
Neutrophils Relative %: 64 %
Platelets: 139 10*3/uL — ABNORMAL LOW (ref 150–440)
RBC: 4.18 MIL/uL (ref 3.80–5.20)
RDW: 16.5 % — ABNORMAL HIGH (ref 11.5–14.5)
WBC: 5.2 10*3/uL (ref 3.6–11.0)

## 2018-01-08 MED ORDER — DOXYCYCLINE HYCLATE 100 MG PO CAPS: 100.0000 mg | ORAL_CAPSULE | Freq: Two times a day (BID) | ORAL | 0 refills | Status: DC

## 2018-01-08 NOTE — ED Notes (Signed)
Velcro wrist splint and sling applied to left arm. PMS intact post application

## 2018-01-08 NOTE — ED Triage Notes (Signed)
Patient states she woke up on Saturday with a swollen painful left hand .  Patient states it is extremely painful and swollen still

## 2018-01-08 NOTE — ED Provider Notes (Signed)
[facility]    CSN: [id number] Arrival date & time: 01/08/18  1220     History   Chief Complaint Chief Complaint  Patient presents with  . Hand Pain    HPI Kara Griffin is a 82 y.o. female.   HPI  82 year old female accompanied by her granddaughter states that she was awakened Saturday morning around 2 or 3:00 with a swollen painful left nondominant hand.  He states that it has continued to be extremely painful and swollen.  She has no fever today but she has felt feverish off and on since the onset.  History of MDS with a WBC count of usually 2-3.  Had gout and her uric acid was in the fours.  She states that it hurts to move her wrist but she does have some motion.           Past Medical History:  Diagnosis Date  . Anemia   . Diabetes mellitus ([place])   . Environmental allergies   . Hypercholesterolemia   . Hypertension   . Myelodysplastic syndrome ([place])    Sees Dr Jerrye Noble    Patient Active Problem List   Diagnosis Date Noted  . Pain of right thumb 05/25/2017  . Neuropathy 05/17/2016  . Left hip pain 01/15/2016  . Health care maintenance 01/19/2015  . Dizziness 01/16/2015  . BMI 33.0-33.9,adult 09/14/2014  . Hypercholesterolemia 08/13/2012  . Thyroid nodule 08/13/2012  . Osteopenia 08/13/2012  . Myelodysplastic syndrome ([place]) 06/04/2012  . Hypertension 06/04/2012  . Diabetes mellitus ([place]) 06/04/2012  . Environmental allergies 06/04/2012    Past Surgical History:  Procedure Laterality Date  . ABDOMINAL HYSTERECTOMY    . [place]  . DILATION AND CURETTAGE OF UTERUS    . TONSILECTOMY, ADENOIDECTOMY, BILATERAL MYRINGOTOMY AND TUBES  1963  . TOTAL ABDOMINAL HYSTERECTOMY W/ BILATERAL SALPINGOOPHORECTOMY  1985   secondary to abnormal cells and abnormal uterine bleeding    OB History   None      Home Medications    Prior to Admission medications   Medication Sig Start Date End Date Taking? Authorizing  Provider  aspirin 81 MG tablet Take 81 mg by mouth daily.   Yes [provider]  Calcium Carbonate-Vitamin D (CALCIUM 600+D) 600-400 MG-UNIT per tablet Take 1 tablet by mouth daily. Reported on 01/15/2016   Yes [provider]  HYDROcodone-acetaminophen (NORCO/VICODIN) 5-325 MG tablet 1 tab po q 8 hours prn 06/26/17  Yes Conty, [place], MD  lenalidomide (REVLIMID) 10 MG capsule Take 10 mg by mouth daily.   Yes [provider]  losartan (COZAAR) 25 MG tablet Take 1 tablet bid 11/20/17  Yes Einar Pheasant, MD  metFORMIN (GLUCOPHAGE) 1000 MG tablet TAKE 1 TABLET BY MOUTH TWICE DAILY 12/07/17  Yes Einar Pheasant, MD  Multiple Vitamin (MULTIVITAMIN) tablet Take 1 tablet by mouth daily.   Yes [provider]  sitaGLIPtin (JANUVIA) 100 MG tablet TAKE 1 TABLET(100 MG) BY MOUTH DAILY 11/20/17  Yes Einar Pheasant, MD  cyanocobalamin 500 MCG tablet Take 1,000 mcg by mouth daily.     [provider]  doxycycline (VIBRAMYCIN) 100 MG capsule Take 1 capsule (100 mg total) by mouth 2 (two) times daily. 01/08/18   Lorin Picket, PA-C  fluticasone (FLONASE) 50 MCG/ACT nasal spray Place 2 sprays into both nostrils daily.    [provider]  nystatin cream (MYCOSTATIN) Apply 1 application topically 2 (two) times daily. Patient taking differently: Apply 1 application topically 2 (two)  times daily as needed.  05/22/15   Einar Pheasant, MD  ONE TOUCH LANCETS MISC Use to check sugars 1-2 times a day Dx. E11.9 (One Touch Ultra Mini) 09/08/14   Einar Pheasant, MD  ONE TOUCH ULTRA TEST test strip TEST BLOOD SUGAR TWICE DAILY AS DIRECTED 07/26/17   Einar Pheasant, MD    Family History Family History  Problem Relation Age of Onset  . Heart disease Father        Deceased (MI) - 14  . Diabetes Mother        Deceased  . Thyroid cancer Mother   . Osteoarthritis Mother   . Asthma Unknown   . Diabetes Brother   . Heart disease Maternal Grandmother        myocardial  infarction - 17  . Asthma Maternal Grandmother   . Heart disease Paternal Grandmother        myocardial infarction-52  . Heart disease Brother   . Sinusitis Brother   . Depression Son   . Diabetes Son   . Depression Son   . Diabetes Son   . Depression Son   . Breast cancer Neg Hx     Social History Social History   Tobacco Use  . Smoking status: Never Smoker  . Smokeless tobacco: Never Used  Substance Use Topics  . Alcohol use: No    Alcohol/week: 0.0 oz  . Drug use: No     Allergies   Patient has no known allergies.   Review of Systems Review of Systems  Constitutional: Positive for activity change and chills. Negative for diaphoresis and fever.  Musculoskeletal: Positive for arthralgias.  Skin: Positive for color change.  All other systems reviewed and are negative.    Physical Exam Triage Vital Signs ED Triage Vitals  Enc Vitals Group     BP 01/08/18 1239 (!) 143/72     Pulse Rate 01/08/18 1239 (!) 102     Resp 01/08/18 1239 18     Temp 01/08/18 1239 98.4 F (36.9 C)     Temp Source 01/08/18 1239 Oral     SpO2 01/08/18 1239 99 %     Weight 01/08/18 1235 176 lb (79.8 kg)     Height 01/08/18 1235 5\' 1"  (1.549 m)     Head Circumference --      Peak Flow --      Pain Score 01/08/18 1235 10     Pain Loc --      Pain Edu? --      Excl. in [place]? --    No data found.  Updated Vital Signs BP (!) 143/72 (BP Location: Right Arm)   Pulse (!) 102   Temp 98.4 F (36.9 C) (Oral)   Resp 18   Ht 5\' 1"  (1.549 m)   Wt 176 lb (79.8 kg)   LMP 08/13/1984   SpO2 99%   BMI 33.25 kg/m   Visual Acuity Right Eye Distance:   Left Eye Distance:   Bilateral Distance:    Right Eye Near:   Left Eye Near:    Bilateral Near:     Physical Exam  Constitutional: She is oriented to person, place, and time. She appears well-developed and well-nourished. No distress.  HENT:  Head: Normocephalic.  Eyes: Pupils are equal, round, and reactive to light. Right eye exhibits  no discharge. Left eye exhibits no discharge.  Neck: Normal range of motion.  Musculoskeletal: Normal range of motion. She exhibits edema and tenderness.  Examination of the  left nondominant hand and forearm nonpitting edema present mostly centered over the dorsum of the wrist.  It also extends into the fingers most likely from nonuse and dependency.  Is a sheen over the skin is with mild erythema is non-blanchable.  This is mostly over the dorsum of the wrist along the ulnar forearm.  At the base of the erythema proximally is a scaly area that the patient states is present for quite some time but she has a tendency of picking at it to remove the skin.  No fluctuance or induration.  Movement of the wrist is painful to the patient although she does have mild amount of movement.  Full range of motion of the elbow flexion or extension and because of her not using the wrist,  pronation supination are not tested.  Neurological: She is alert and oriented to person, place, and time.  Skin: Skin is warm and dry. She is not diaphoretic. There is erythema.  Psychiatric: She has a normal mood and affect. Her behavior is normal. Judgment and thought content normal.  Nursing note and vitals reviewed.    UC Treatments / Results  Labs (all labs ordered are listed, but only abnormal results are displayed) Labs Reviewed  CBC WITH DIFFERENTIAL/PLATELET - Abnormal; Notable for the following components:      Result Value   RDW 16.5 (*)    Platelets 139 (*)    Lymphs Abs 0.6 (*)    Monocytes Absolute 1.1 (*)    All other components within normal limits    EKG None  Radiology Dg Wrist Complete Left  Result Date: 01/08/2018 CLINICAL DATA:  Wrist pain and swelling for several days, no known injury, initial encounter EXAM: LEFT WRIST - COMPLETE 3+ VIEW COMPARISON:  None. FINDINGS: Mild degenerative changes are noted at the first [facility] joint. No acute fracture or dislocation is noted. Mild cartilaginous  calcification is noted at the radiocarpal joint. No other focal abnormality is seen. IMPRESSION: Mild degenerative change without acute abnormality. Electronically Signed   By: Inez Catalina M.D.   On: 01/08/2018 13:17   Dg Hand Complete Left  Result Date: 01/08/2018 CLINICAL DATA:  Left hand pain and swelling for several days, no known injury, initial encounter EXAM: LEFT HAND - COMPLETE 3+ VIEW COMPARISON:  None. FINDINGS: Mild soft tissue swelling is noted. Degenerative changes in the interphalangeal joints are seen. No acute fracture or dislocation is noted. Degenerative change of the first [facility] joint is noted as well. IMPRESSION: Degenerative change without acute bony abnormality. Electronically Signed   By: Inez Catalina M.D.   On: 01/08/2018 13:19    Procedures Procedures (including critical care time)  Medications Ordered in UC Medications - No data to display  Initial Impression / Assessment and Plan / UC Course  I have reviewed the triage vital signs and the nursing notes.  Pertinent labs & imaging results that were available during my care of the patient were reviewed by me and considered in my medical decision making (see chart for details).     Plan: 1. Test/x-ray results and diagnosis reviewed with patient 2. rx as per orders; risks, benefits, potential side effects reviewed with patient 3. Recommend supportive treatment with empiric starting of antibiotic with doxycycline.  Patient is not permitted to take NSAIDs according to her.  For Her discomfort recommended that she use hydrocodone half a tablet with a regular Tylenol keeping her Tylenol usage under 4 g/day.  Needs to elevate her hand above her heart  to control the swelling as well as use her fingers in order to help with swelling of the finger itself.  I will give her a splint for support in a sling which she states is more comfortable.  She can use ice on the area 20 minutes every 2 hours for 5 times daily.  Because of her  MDS I have asked her to contact her hematologist as soon as possible to notify them of our findings today and initiation of the doxycycline.  Her usual WBCs are between 2 and 3 and with a 5.5 may be significant for a beginning of a rise and her neutrophils are 3.3.  We will defer any further treatment to her hematologist. 4. F/u prn if symptoms worsen or don't improve  Final Clinical Impressions(s) / UC Diagnoses   Final diagnoses:  Cellulitis of left upper extremity     Discharge Instructions     Elevate your hand above your heart as much as possible to control swelling.  Use your fingers also to helpContact your hematologist as soon as possible.   ED Prescriptions    Medication Sig Dispense Auth. Provider   doxycycline (VIBRAMYCIN) 100 MG capsule Take 1 capsule (100 mg total) by mouth 2 (two) times daily. 14 capsule Lorin Picket, PA-C     Controlled Substance Prescriptions [place] Controlled Substance Registry consulted? Not Applicable   Lorin Picket, PA-C 01/08/18 1503

## 2018-01-08 NOTE — Discharge Instructions (Addendum)
Elevate your hand above your heart as much as possible to control swelling.  Use your fingers also to helpContact your hematologist as soon as possible.

## 2018-01-11 ENCOUNTER — Ambulatory Visit: Payer: Medicare Other

## 2018-02-06 LAB — HM DIABETES EYE EXAM

## 2018-02-07 ENCOUNTER — Encounter: Payer: Self-pay | Admitting: Internal Medicine

## 2018-02-16 LAB — BASIC METABOLIC PANEL
BUN: 14 (ref 4–21)
Creatinine: 0.8 (ref 0.5–1.1)
GLUCOSE: 138
POTASSIUM: 4 (ref 3.4–5.3)
Sodium: 139 (ref 137–147)

## 2018-02-16 LAB — CBC AND DIFFERENTIAL
HEMATOCRIT: 35 — AB (ref 36–46)
HEMOGLOBIN: 11.9 — AB (ref 12.0–16.0)
Platelets: 157 (ref 150–399)
WBC: 3.4

## 2018-02-16 LAB — HEPATIC FUNCTION PANEL
ALT: 21 (ref 7–35)
AST: 14 (ref 13–35)
Alkaline Phosphatase: 80 (ref 25–125)
BILIRUBIN, TOTAL: 0.5

## 2018-02-22 ENCOUNTER — Other Ambulatory Visit: Payer: Self-pay | Admitting: Internal Medicine

## 2018-03-21 ENCOUNTER — Encounter: Payer: Self-pay | Admitting: Internal Medicine

## 2018-04-10 ENCOUNTER — Other Ambulatory Visit: Payer: Self-pay | Admitting: Internal Medicine

## 2018-05-03 ENCOUNTER — Other Ambulatory Visit: Payer: Self-pay | Admitting: Internal Medicine

## 2018-05-31 ENCOUNTER — Other Ambulatory Visit: Payer: Self-pay | Admitting: Internal Medicine

## 2018-06-02 ENCOUNTER — Other Ambulatory Visit: Payer: Self-pay | Admitting: Internal Medicine

## 2018-06-05 LAB — CBC AND DIFFERENTIAL
HCT: 37 (ref 36–46)
HEMOGLOBIN: 12.6 (ref 12.0–16.0)
Neutrophils Absolute: 2
Platelets: 130 — AB (ref 150–399)
WBC: 3.4

## 2018-06-05 LAB — BASIC METABOLIC PANEL
BUN: 16 (ref 4–21)
Creatinine: 0.7 (ref 0.5–1.1)
Glucose: 108
POTASSIUM: 3.7 (ref 3.4–5.3)
SODIUM: 137 (ref 137–147)

## 2018-06-05 LAB — HEPATIC FUNCTION PANEL
ALK PHOS: 76 (ref 25–125)
ALT: 19 (ref 7–35)
AST: 14 (ref 13–35)
BILIRUBIN, TOTAL: 0.6

## 2018-06-07 ENCOUNTER — Encounter: Payer: Self-pay | Admitting: Internal Medicine

## 2018-06-28 ENCOUNTER — Other Ambulatory Visit: Payer: Self-pay | Admitting: Internal Medicine

## 2018-07-03 ENCOUNTER — Other Ambulatory Visit: Payer: Self-pay | Admitting: Internal Medicine

## 2018-07-03 LAB — BASIC METABOLIC PANEL
BUN: 18 (ref 4–21)
Creatinine: 0.8 (ref 0.5–1.1)
Glucose: 120
POTASSIUM: 4.2 (ref 3.4–5.3)
Sodium: 138 (ref 137–147)

## 2018-07-03 LAB — CBC AND DIFFERENTIAL
HEMATOCRIT: 35 — AB (ref 36–46)
HEMOGLOBIN: 12.1 (ref 12.0–16.0)
Platelets: 115 — AB (ref 150–399)
WBC: 3.3

## 2018-07-16 ENCOUNTER — Encounter: Payer: Self-pay | Admitting: Internal Medicine

## 2018-07-16 ENCOUNTER — Ambulatory Visit (INDEPENDENT_AMBULATORY_CARE_PROVIDER_SITE_OTHER): Payer: Medicare Other

## 2018-07-16 ENCOUNTER — Ambulatory Visit: Payer: Medicare Other | Admitting: Internal Medicine

## 2018-07-16 VITALS — BP 138/80 | HR 94 | Temp 97.7°F | Resp 16 | Wt 178.5 lb

## 2018-07-16 DIAGNOSIS — E7800 Pure hypercholesterolemia, unspecified: Secondary | ICD-10-CM

## 2018-07-16 DIAGNOSIS — Z6833 Body mass index (BMI) 33.0-33.9, adult: Secondary | ICD-10-CM

## 2018-07-16 DIAGNOSIS — D469 Myelodysplastic syndrome, unspecified: Secondary | ICD-10-CM

## 2018-07-16 DIAGNOSIS — M545 Low back pain, unspecified: Secondary | ICD-10-CM

## 2018-07-16 DIAGNOSIS — E119 Type 2 diabetes mellitus without complications: Secondary | ICD-10-CM | POA: Diagnosis not present

## 2018-07-16 DIAGNOSIS — Z9109 Other allergy status, other than to drugs and biological substances: Secondary | ICD-10-CM

## 2018-07-16 DIAGNOSIS — I10 Essential (primary) hypertension: Secondary | ICD-10-CM

## 2018-07-16 DIAGNOSIS — M25551 Pain in right hip: Secondary | ICD-10-CM

## 2018-07-16 NOTE — Progress Notes (Signed)
Patient ID: Kara Griffin, female   DOB: 10/20/1935, 82 y.o.   MRN: [id number]   Subjective:    Patient ID: Kara Griffin, female    DOB: 10-07-1935, 82 y.o.   MRN: [id number]  HPI  Patient here for a scheduled follow up.  Has h/o MDS.  Followed by Dr Florene Glen.  Just evaluated 07/03/18.  On revlimid.  Stable.  No changes made.  She brought in recorded sugar readings.  Reviewed.  AM sugars mostly averaging 150-170s.  PM sugars averaging 160-200.  Trying to watch her diet.  She is having increased pain in her right lower back/hip.  Increased pain.  Took left over 1/2 of hydrocodone.  Did not help.  Taking tylenol ES.  Also using aspercream and heating pad - helped.  When sits down - ok.  No chest pain.  No sob.  No acid reflux.  No abdominal pain.  Bowels moving.     Past Medical History:  Diagnosis Date  . Anemia   . Diabetes mellitus ([place])   . Environmental allergies   . Hypercholesterolemia   . Hypertension   . Myelodysplastic syndrome ([place])    Sees Dr Jerrye Noble   Past Surgical History:  Procedure Laterality Date  . ABDOMINAL HYSTERECTOMY    . [place]  . DILATION AND CURETTAGE OF UTERUS    . TONSILECTOMY, ADENOIDECTOMY, BILATERAL MYRINGOTOMY AND TUBES  1963  . TOTAL ABDOMINAL HYSTERECTOMY W/ BILATERAL SALPINGOOPHORECTOMY  1985   secondary to abnormal cells and abnormal uterine bleeding   Family History  Problem Relation Age of Onset  . Heart disease Father        Deceased (MI) - 65  . Diabetes Mother        Deceased  . Thyroid cancer Mother   . Osteoarthritis Mother   . Asthma Unknown   . Diabetes Brother   . Heart disease Maternal Grandmother        myocardial infarction - 49  . Asthma Maternal Grandmother   . Heart disease Paternal Grandmother        myocardial infarction-52  . Heart disease Brother   . Sinusitis Brother   . Depression Son   . Diabetes Son   . Depression Son   . Diabetes Son   . Depression Son   . Breast cancer  Neg Hx    Social History   Socioeconomic History  . Marital status: Divorced    Spouse name: Not on file  . Number of children: 3  . Years of education: Not on file  . Highest education level: Not on file  Occupational History  . Occupation: retired Games developer  Social Needs  . Financial resource strain: Not on file  . Food insecurity:    Worry: Not on file    Inability: Not on file  . Transportation needs:    Medical: Not on file    Non-medical: Not on file  Tobacco Use  . Smoking status: Never Smoker  . Smokeless tobacco: Never Used  Substance and Sexual Activity  . Alcohol use: No    Alcohol/week: 0.0 standard drinks  . Drug use: No  . Sexual activity: Never  Lifestyle  . Physical activity:    Days per week: Not on file    Minutes per session: Not on file  . Stress: Not on file  Relationships  . Social connections:    Talks on phone: Not on file    Gets together: Not on file  Attends religious service: Not on file    Active member of club or organization: Not on file    Attends meetings of clubs or organizations: Not on file    Relationship status: Not on file  Other Topics Concern  . Not on file  Social History Narrative  . Not on file    Outpatient Encounter Medications as of 07/16/2018  Medication Sig  . aspirin 81 MG tablet Take 81 mg by mouth daily.  . bevacizumab (AVASTIN) 400 MG/16ML SOLN by Intravitreal route.  . Calcium Carbonate-Vitamin D (CALCIUM 600+D) 600-400 MG-UNIT per tablet Take 1 tablet by mouth daily. Reported on 01/15/2016  . cyanocobalamin 500 MCG tablet Take 1,000 mcg by mouth daily.   . fluticasone (FLONASE) 50 MCG/ACT nasal spray Place 2 sprays into both nostrils daily.  Marland Kitchen lenalidomide (REVLIMID) 10 MG capsule Take 10 mg by mouth daily.  Marland Kitchen losartan (COZAAR) 25 MG tablet TAKE 1 TABLET BY MOUTH TWICE DAILY  . metFORMIN (GLUCOPHAGE) 1000 MG tablet TAKE 1 TABLET BY MOUTH TWICE DAILY  . Multiple Vitamin (MULTIVITAMIN) tablet Take 1 tablet  by mouth daily.  . Multiple Vitamins-Minerals (PRESERVISION AREDS 2+MULTI VIT) CAPS Take 1 capsule by mouth 2 (two) times daily.  Marland Kitchen nystatin cream (MYCOSTATIN) Apply 1 application topically 2 (two) times daily. (Patient taking differently: Apply 1 application topically 2 (two) times daily as needed. )  . ONE TOUCH LANCETS MISC Use to check sugars 1-2 times a day Dx. E11.9 (One Touch Ultra Mini)  . ONE TOUCH ULTRA TEST test strip TEST BLOOD SUGAR TWICE DAILY AS DIRECTED  . sitaGLIPtin (JANUVIA) 100 MG tablet TAKE 1 TABLET(100 MG) BY MOUTH DAILY  . doxycycline (VIBRAMYCIN) 100 MG capsule Take 1 capsule (100 mg total) by mouth 2 (two) times daily. (Patient not taking: Reported on 07/16/2018)  . HYDROcodone-acetaminophen (NORCO/VICODIN) 5-325 MG tablet 1 tab po q 8 hours prn (Patient not taking: Reported on 07/16/2018)   No facility-administered encounter medications on file as of 07/16/2018.     Review of Systems  Constitutional: Negative for appetite change and unexpected weight change.  HENT: Negative for congestion and sinus pressure.   Respiratory: Negative for cough, chest tightness and shortness of breath.   Cardiovascular: Negative for chest pain, palpitations and leg swelling.  Gastrointestinal: Negative for abdominal pain, diarrhea, nausea and vomiting.  Genitourinary: Negative for difficulty urinating and dysuria.  Musculoskeletal: Negative for joint swelling and myalgias.       Increased right lower back/hip pain.  Increased recently.    Skin: Negative for color change and rash.  Neurological: Negative for dizziness, light-headedness and headaches.  Psychiatric/Behavioral: Negative for agitation and dysphoric mood.       Objective:     Blood pressure rechecked by me:  130/68  Physical Exam  Constitutional: She appears well-developed and well-nourished. No distress.  HENT:  Nose: Nose normal.  Mouth/Throat: Oropharynx is clear and moist.  Neck: Neck supple. No thyromegaly  present.  Cardiovascular: Normal rate and regular rhythm.  Pulmonary/Chest: Breath sounds normal. No respiratory distress. She has no wheezes.  Abdominal: Soft. Bowel sounds are normal. There is no tenderness.  Musculoskeletal: She exhibits no edema or tenderness.  Lymphadenopathy:    She has no cervical adenopathy.  Skin: No rash noted. No erythema.  Psychiatric: She has a normal mood and affect. Her behavior is normal.    BP 138/80 (BP Location: Left Arm, Patient Position: Sitting, Cuff Size: Large)   Pulse 94   Temp 97.7 F (36.5  C) (Oral)   Resp 16   Wt 178 lb 8 oz (81 kg)   LMP 08/13/1984   SpO2 98%   BMI 33.73 kg/m  Wt Readings from Last 3 Encounters:  07/16/18 178 lb 8 oz (81 kg)  01/08/18 176 lb (79.8 kg)  11/20/17 177 lb 6.4 oz (80.5 kg)     Lab Results  Component Value Date   WBC 3.4 06/05/2018   HGB 12.6 06/05/2018   HCT 37 06/05/2018   PLT 130 (A) 06/05/2018   GLUCOSE 172 (H) 01/15/2016   CHOL 141 02/20/2017   TRIG 227 (H) 02/20/2017   HDL 44 02/20/2017   LDLDIRECT 85.3 08/23/2013   LDLCALC 52 02/20/2017   ALT 19 06/05/2018   AST 14 06/05/2018   NA 137 06/05/2018   K 3.7 06/05/2018   CL 106 01/15/2016   CREATININE 0.7 06/05/2018   BUN 16 06/05/2018   CO2 24 01/15/2016   TSH 1.586 02/20/2017   HGBA1C 6.2 08/10/2017   MICROALBUR 1.0 01/16/2015    Dg Wrist Complete Left  Result Date: 01/08/2018 CLINICAL DATA:  Wrist pain and swelling for several days, no known injury, initial encounter EXAM: LEFT WRIST - COMPLETE 3+ VIEW COMPARISON:  None. FINDINGS: Mild degenerative changes are noted at the first [facility] joint. No acute fracture or dislocation is noted. Mild cartilaginous calcification is noted at the radiocarpal joint. No other focal abnormality is seen. IMPRESSION: Mild degenerative change without acute abnormality. Electronically Signed   By: Inez Catalina M.D.   On: 01/08/2018 13:17   Dg Hand Complete Left  Result Date: 01/08/2018 CLINICAL DATA:  Left  hand pain and swelling for several days, no known injury, initial encounter EXAM: LEFT HAND - COMPLETE 3+ VIEW COMPARISON:  None. FINDINGS: Mild soft tissue swelling is noted. Degenerative changes in the interphalangeal joints are seen. No acute fracture or dislocation is noted. Degenerative change of the first [facility] joint is noted as well. IMPRESSION: Degenerative change without acute bony abnormality. Electronically Signed   By: Inez Catalina M.D.   On: 01/08/2018 13:19       Assessment & Plan:   Problem List Items Addressed This Visit    BMI 33.0-33.9,adult    Diet and exercise.  Follow.       Diabetes mellitus ([place])    Sugars as outlined.  Low carb diet and exercise.  Follow met b and a1c.        Environmental allergies    Controlled.        Hypercholesterolemia    Low cholesterol diet and exercise.  Follow lipid panel.        Hypertension    Blood pressure under good control.  Continue same medication regimen.  Follow pressures.  Follow metabolic panel.        Myelodysplastic syndrome ([place])    Followed by Dr Florene Glen (hematology).        Relevant Medications   bevacizumab (AVASTIN) 400 MG/16ML SOLN    Other Visit Diagnoses    Right-sided low back pain without sciatica, unspecified chronicity    -  Primary   Persistent increased pain.  Check xray.  May need PT and/or referral to ortho.     Relevant Orders   DG Lumbar Spine 2-3 Views (Completed)   DG HIP UNILAT WITH PELVIS 2-3 VIEWS RIGHT (Completed)   Right hip pain       Relevant Orders   DG HIP UNILAT WITH PELVIS 2-3 VIEWS RIGHT (Completed)  Einar Pheasant, MD

## 2018-07-17 ENCOUNTER — Other Ambulatory Visit: Payer: Self-pay | Admitting: Internal Medicine

## 2018-07-17 DIAGNOSIS — M545 Low back pain, unspecified: Secondary | ICD-10-CM

## 2018-07-17 NOTE — Progress Notes (Signed)
Order placed for ortho referral.   

## 2018-07-21 ENCOUNTER — Encounter: Payer: Self-pay | Admitting: Internal Medicine

## 2018-07-21 NOTE — Assessment & Plan Note (Signed)
Sugars as outlined.  Low carb diet and exercise.  Follow met b and a1c.

## 2018-07-21 NOTE — Assessment & Plan Note (Signed)
Diet and exercise.  Follow.  

## 2018-07-21 NOTE — Assessment & Plan Note (Signed)
Controlled.  

## 2018-07-21 NOTE — Assessment & Plan Note (Signed)
Blood pressure under good control.  Continue same medication regimen.  Follow pressures.  Follow metabolic panel.   

## 2018-07-21 NOTE — Assessment & Plan Note (Signed)
Low cholesterol diet and exercise.  Follow lipid panel.   

## 2018-07-21 NOTE — Assessment & Plan Note (Signed)
Followed by Dr Florene Glen (hematology).

## 2018-07-29 ENCOUNTER — Other Ambulatory Visit: Payer: Self-pay | Admitting: Internal Medicine

## 2018-08-07 ENCOUNTER — Other Ambulatory Visit: Payer: Self-pay | Admitting: Internal Medicine

## 2018-08-07 ENCOUNTER — Inpatient Hospital Stay: Admit: 2018-08-07 | Payer: Self-pay

## 2018-08-07 DIAGNOSIS — E7800 Pure hypercholesterolemia, unspecified: Secondary | ICD-10-CM

## 2018-08-07 DIAGNOSIS — E119 Type 2 diabetes mellitus without complications: Secondary | ICD-10-CM

## 2018-08-08 ENCOUNTER — Other Ambulatory Visit: Payer: Self-pay | Admitting: Internal Medicine

## 2018-08-13 ENCOUNTER — Other Ambulatory Visit
Admission: RE | Admit: 2018-08-13 | Discharge: 2018-08-13 | Disposition: A | Discharge status: Home / Self Care | Payer: Medicare Other | Attending: Internal Medicine | Admitting: Internal Medicine

## 2018-08-13 DIAGNOSIS — E7800 Pure hypercholesterolemia, unspecified: Secondary | ICD-10-CM | POA: Diagnosis present

## 2018-08-13 DIAGNOSIS — E119 Type 2 diabetes mellitus without complications: Secondary | ICD-10-CM | POA: Insufficient documentation

## 2018-08-13 LAB — LIPID PANEL
CHOL/HDL RATIO: 3.5 ratio
CHOLESTEROL: 158 mg/dL (ref 0–200)
HDL: 45 mg/dL (ref >40)
LDL Cholesterol: 69 mg/dL (ref 0–99)
Triglycerides: 221 mg/dL — ABNORMAL HIGH (ref <150)
VLDL: 44 mg/dL — AB (ref 0–40)

## 2018-08-13 LAB — HEMOGLOBIN A1C
Hgb A1c MFr Bld: 5.9 % — ABNORMAL HIGH (ref 4.8–5.6)
Mean Plasma Glucose: 122.63 mg/dL

## 2018-08-16 ENCOUNTER — Telehealth: Payer: Self-pay | Admitting: Internal Medicine

## 2018-08-16 MED ORDER — ROSUVASTATIN CALCIUM 10 MG PO TABS: 10.0000 mg | ORAL_TABLET | Freq: Every day | ORAL | 2 refills | Status: DC

## 2018-08-16 NOTE — Telephone Encounter (Signed)
Copied from [place] [id number]. Topic: Quick Communication - Lab Results (Clinic Use ONLY) >> Aug 16, 2018  1:39 PM Lars Masson, LPN wrote: Called patient to inform them of lab results. When patient returns call, triage nurse may disclose results. >> Aug 16, 2018  3:11 PM Lennox Solders wrote: Pt is calling back requesting blood work results

## 2018-08-16 NOTE — Telephone Encounter (Signed)
Call returned to pt.  Lab results given; see Result Note.

## 2018-08-16 NOTE — Telephone Encounter (Signed)
Notes recorded by Einar Pheasant, MD on 08/14/2018 at 9:20 PM EST  Please call and notify pt that her overall sugar control looks good. Cholesterol levels are relatively stable from last check, but given her calculated cholesterol risk, it is recommended f/u her to start a cholesterol medication. If agreeable, start crestor 10mg  q day. Will need liver panel checked 6 weeks after starting the cholesterol medication.    Discussed with pt. the above result notes.  Pt. stated she is willing to start Crestor as recommended by Dr. Nicki Reaper.  Advised will send Rx to her pharmacy.  Verb. Understanding.

## 2018-08-17 ENCOUNTER — Telehealth: Payer: Self-pay

## 2018-08-20 ENCOUNTER — Other Ambulatory Visit: Payer: Self-pay | Admitting: Internal Medicine

## 2018-08-20 DIAGNOSIS — E7800 Pure hypercholesterolemia, unspecified: Secondary | ICD-10-CM

## 2018-08-20 NOTE — Progress Notes (Signed)
Order placed for f/u liver panel.  

## 2018-08-26 ENCOUNTER — Other Ambulatory Visit: Payer: Self-pay | Admitting: Internal Medicine

## 2018-08-27 ENCOUNTER — Other Ambulatory Visit: Payer: Self-pay

## 2018-08-27 MED ORDER — LOSARTAN POTASSIUM 25 MG PO TABS: 25.0000 mg | ORAL_TABLET | Freq: Two times a day (BID) | ORAL | 0 refills | Status: DC

## 2018-09-05 ENCOUNTER — Other Ambulatory Visit: Payer: Self-pay | Admitting: Internal Medicine

## 2018-09-06 NOTE — Telephone Encounter (Signed)
Opened in error

## 2018-09-18 LAB — HM DIABETES EYE EXAM

## 2018-09-27 ENCOUNTER — Encounter: Payer: Self-pay | Admitting: Internal Medicine

## 2018-09-28 ENCOUNTER — Encounter: Payer: Self-pay | Admitting: Internal Medicine

## 2018-11-01 ENCOUNTER — Other Ambulatory Visit: Payer: Self-pay | Admitting: Internal Medicine

## 2018-11-01 MED ORDER — LOSARTAN POTASSIUM 25 MG PO TABS: 25.0000 mg | ORAL_TABLET | Freq: Two times a day (BID) | ORAL | 2 refills | Status: DC

## 2018-11-01 NOTE — Telephone Encounter (Signed)
Copied from [place] [id number]. Topic: Quick Communication - Rx Refill/Question >> Nov 01, 2018  4:02 PM Selinda Flavin B, NT wrote: Medication: losartan (COZAAR) 25 MG tablet  Has the patient contacted their pharmacy? Yes.   (Agent: If no, request that the patient contact the pharmacy for the refill.) (Agent: If yes, when and what did the pharmacy advise?)  Preferred Pharmacy (with phone number or street name): [facility], [place] [address] [place]: Please be advised that RX refills may take up to 3 business days. We ask that you follow-up with your pharmacy.

## 2018-11-14 ENCOUNTER — Other Ambulatory Visit: Payer: Self-pay | Admitting: Internal Medicine

## 2018-11-20 LAB — BASIC METABOLIC PANEL
BUN: 16 (ref 4–21)
Creatinine: 0.8 (ref <=1.1)
Glucose: 131
Potassium: 3.8 (ref 3.4–5.3)
Sodium: 137 (ref 137–147)

## 2018-11-20 LAB — HEPATIC FUNCTION PANEL
ALT: 16 (ref 7–35)
AST: 13 (ref 13–35)
Alkaline Phosphatase: 78 (ref 25–125)
Bilirubin, Total: 0.5

## 2018-11-20 LAB — CBC AND DIFFERENTIAL
HCT: 36 (ref 36–46)
Hemoglobin: 12.4 (ref 12.0–16.0)
WBC: 4.7

## 2018-11-26 ENCOUNTER — Other Ambulatory Visit: Payer: Self-pay | Admitting: Internal Medicine

## 2018-11-28 ENCOUNTER — Encounter: Payer: Medicare Other | Admitting: Internal Medicine

## 2018-12-17 ENCOUNTER — Other Ambulatory Visit
Admission: RE | Admit: 2018-12-17 | Discharge: 2018-12-17 | Disposition: A | Discharge status: Home / Self Care | Payer: Medicare Other | Attending: Hematology & Oncology | Admitting: Hematology & Oncology

## 2018-12-17 ENCOUNTER — Other Ambulatory Visit: Payer: Self-pay

## 2018-12-17 DIAGNOSIS — D46C Myelodysplastic syndrome with isolated del(5q) chromosomal abnormality: Secondary | ICD-10-CM | POA: Insufficient documentation

## 2018-12-17 LAB — CBC WITH DIFFERENTIAL/PLATELET
Abs Immature Granulocytes: 0.2 10*3/uL — ABNORMAL HIGH (ref 0.00–0.07)
Band Neutrophils: 4 %
Basophils Absolute: 0.1 10*3/uL (ref 0.0–0.1)
Basophils Relative: 2 %
Eosinophils Absolute: 0.1 10*3/uL (ref 0.0–0.5)
Eosinophils Relative: 2 %
HCT: 33.6 % — ABNORMAL LOW (ref 36.0–46.0)
Hemoglobin: 11.1 g/dL — ABNORMAL LOW (ref 12.0–15.0)
Lymphocytes Relative: 13 %
Lymphs Abs: 0.4 10*3/uL — ABNORMAL LOW (ref 0.7–4.0)
MCH: 30.2 pg (ref 26.0–34.0)
MCHC: 33 g/dL (ref 30.0–36.0)
MCV: 91.3 fL (ref 80.0–100.0)
Metamyelocytes Relative: 3 %
Monocytes Absolute: 0.3 10*3/uL (ref 0.1–1.0)
Monocytes Relative: 9 %
Myelocytes: 5 %
Neutro Abs: 2 10*3/uL (ref 1.7–7.7)
Neutrophils Relative %: 62 %
Platelets: 134 10*3/uL — ABNORMAL LOW (ref 150–400)
RBC: 3.68 MIL/uL — ABNORMAL LOW (ref 3.87–5.11)
RDW: 15.9 % — ABNORMAL HIGH (ref 11.5–15.5)
WBC: 3.1 10*3/uL — ABNORMAL LOW (ref 4.0–10.5)
nRBC: 0 % (ref 0.0–0.2)

## 2018-12-17 LAB — COMPREHENSIVE METABOLIC PANEL
ALT: 15 U/L (ref 0–44)
AST: 14 U/L — ABNORMAL LOW (ref 15–41)
Albumin: 3.7 g/dL (ref 3.5–5.0)
Alkaline Phosphatase: 74 U/L (ref 38–126)
Anion gap: 7 (ref 5–15)
BUN: 17 mg/dL (ref 8–23)
CO2: 27 mmol/L (ref 22–32)
Calcium: 8.9 mg/dL (ref 8.9–10.3)
Chloride: 103 mmol/L (ref 98–111)
Creatinine, Ser: 0.72 mg/dL (ref 0.44–1.00)
GFR calc Af Amer: >60 mL/min (ref >60)
GFR calc non Af Amer: >60 mL/min (ref >60)
Glucose, Bld: 153 mg/dL — ABNORMAL HIGH (ref 70–99)
Potassium: 3.8 mmol/L (ref 3.5–5.1)
Sodium: 137 mmol/L (ref 135–145)
Total Bilirubin: 0.7 mg/dL (ref 0.3–1.2)
Total Protein: 6.7 g/dL (ref 6.5–8.1)

## 2018-12-17 LAB — LACTATE DEHYDROGENASE: LDH: 97 U/L — ABNORMAL LOW (ref 98–192)

## 2018-12-17 LAB — URIC ACID: Uric Acid, Serum: 3.2 mg/dL (ref 2.5–7.1)

## 2019-01-11 ENCOUNTER — Other Ambulatory Visit
Admission: RE | Admit: 2019-01-11 | Discharge: 2019-01-11 | Disposition: A | Discharge status: Home / Self Care | Payer: Medicare Other | Attending: Hematology & Oncology | Admitting: Hematology & Oncology

## 2019-01-11 ENCOUNTER — Other Ambulatory Visit: Payer: Self-pay

## 2019-01-11 DIAGNOSIS — D46C Myelodysplastic syndrome with isolated del(5q) chromosomal abnormality: Secondary | ICD-10-CM | POA: Insufficient documentation

## 2019-01-11 LAB — CBC WITH DIFFERENTIAL/PLATELET
Abs Immature Granulocytes: 0.03 10*3/uL (ref 0.00–0.07)
Basophils Absolute: 0.1 10*3/uL (ref 0.0–0.1)
Basophils Relative: 2 %
Eosinophils Absolute: 0.1 10*3/uL (ref 0.0–0.5)
Eosinophils Relative: 3 %
HCT: 34.2 % — ABNORMAL LOW (ref 36.0–46.0)
Hemoglobin: 11.6 g/dL — ABNORMAL LOW (ref 12.0–15.0)
Immature Granulocytes: 1 %
Lymphocytes Relative: 21 %
Lymphs Abs: 0.6 10*3/uL — ABNORMAL LOW (ref 0.7–4.0)
MCH: 30.8 pg (ref 26.0–34.0)
MCHC: 33.9 g/dL (ref 30.0–36.0)
MCV: 90.7 fL (ref 80.0–100.0)
Monocytes Absolute: 0.5 10*3/uL (ref 0.1–1.0)
Monocytes Relative: 18 %
Neutro Abs: 1.7 10*3/uL (ref 1.7–7.7)
Neutrophils Relative %: 55 %
Platelets: 123 10*3/uL — ABNORMAL LOW (ref 150–400)
RBC: 3.77 MIL/uL — ABNORMAL LOW (ref 3.87–5.11)
RDW: 16 % — ABNORMAL HIGH (ref 11.5–15.5)
WBC: 3 10*3/uL — ABNORMAL LOW (ref 4.0–10.5)
nRBC: 0 % (ref 0.0–0.2)

## 2019-01-11 LAB — COMPREHENSIVE METABOLIC PANEL
ALT: 14 U/L (ref 0–44)
AST: 14 U/L — ABNORMAL LOW (ref 15–41)
Albumin: 3.8 g/dL (ref 3.5–5.0)
Alkaline Phosphatase: 78 U/L (ref 38–126)
Anion gap: 9 (ref 5–15)
BUN: 18 mg/dL (ref 8–23)
CO2: 26 mmol/L (ref 22–32)
Calcium: 8.7 mg/dL — ABNORMAL LOW (ref 8.9–10.3)
Chloride: 101 mmol/L (ref 98–111)
Creatinine, Ser: 0.67 mg/dL (ref 0.44–1.00)
GFR calc Af Amer: >60 mL/min (ref >60)
GFR calc non Af Amer: >60 mL/min (ref >60)
Glucose, Bld: 197 mg/dL — ABNORMAL HIGH (ref 70–99)
Potassium: 3.5 mmol/L (ref 3.5–5.1)
Sodium: 136 mmol/L (ref 135–145)
Total Bilirubin: 0.4 mg/dL (ref 0.3–1.2)
Total Protein: 6.7 g/dL (ref 6.5–8.1)

## 2019-01-11 LAB — URIC ACID: Uric Acid, Serum: 3 mg/dL (ref 2.5–7.1)

## 2019-01-11 LAB — LACTATE DEHYDROGENASE: LDH: 95 U/L — ABNORMAL LOW (ref 98–192)

## 2019-01-31 ENCOUNTER — Encounter: Payer: Self-pay | Admitting: Internal Medicine

## 2019-01-31 DIAGNOSIS — E7800 Pure hypercholesterolemia, unspecified: Secondary | ICD-10-CM

## 2019-01-31 DIAGNOSIS — E119 Type 2 diabetes mellitus without complications: Secondary | ICD-10-CM

## 2019-01-31 NOTE — Telephone Encounter (Signed)
Order placed for labs.

## 2019-01-31 NOTE — Telephone Encounter (Signed)
Patient would like to have her A1C checked at [facility] next week

## 2019-02-06 ENCOUNTER — Telehealth: Payer: Self-pay | Admitting: Internal Medicine

## 2019-02-06 NOTE — Telephone Encounter (Signed)
Copied from [place] [id number]. Topic: Quick Communication - Rx Refill/Question >> Feb 06, 2019  3:11 PM Sheppard Coil, [employer] L wrote: Medication: losartan (COZAAR) 25 MG tablet  Has the patient contacted their pharmacy? Yes - pt states she has been trying to get filled at pharmacy and has been without it for 6 days. (Agent: If no, request that the patient contact the pharmacy for the refill.) (Agent: If yes, when and what did the pharmacy advise?)  Preferred Pharmacy (with phone number or street name): [id number] - [place], [place] [address] AT [place] [phone] (Phone) [phone] (Fax)  Agent: Please be advised that RX refills may take up to 3 business days. We ask that you follow-up with your pharmacy.

## 2019-02-07 ENCOUNTER — Other Ambulatory Visit: Payer: Self-pay

## 2019-02-07 MED ORDER — LOSARTAN POTASSIUM 25 MG PO TABS: 25.0000 mg | ORAL_TABLET | Freq: Two times a day (BID) | ORAL | 2 refills | Status: DC

## 2019-02-07 NOTE — Telephone Encounter (Signed)
rx sent in 

## 2019-02-08 ENCOUNTER — Other Ambulatory Visit
Admission: RE | Admit: 2019-02-08 | Discharge: 2019-02-08 | Disposition: A | Discharge status: Home / Self Care | Payer: Medicare Other | Attending: Internal Medicine | Admitting: Internal Medicine

## 2019-02-08 ENCOUNTER — Other Ambulatory Visit: Payer: Self-pay

## 2019-02-08 DIAGNOSIS — E7800 Pure hypercholesterolemia, unspecified: Secondary | ICD-10-CM

## 2019-02-08 DIAGNOSIS — E119 Type 2 diabetes mellitus without complications: Secondary | ICD-10-CM

## 2019-02-08 LAB — CBC WITH DIFFERENTIAL/PLATELET
Abs Immature Granulocytes: 0.03 10*3/uL (ref 0.00–0.07)
Basophils Absolute: 0.1 10*3/uL (ref 0.0–0.1)
Basophils Relative: 2 %
Eosinophils Absolute: 0.1 10*3/uL (ref 0.0–0.5)
Eosinophils Relative: 3 %
HCT: 34.7 % — ABNORMAL LOW (ref 36.0–46.0)
Hemoglobin: 11.8 g/dL — ABNORMAL LOW (ref 12.0–15.0)
Immature Granulocytes: 1 %
Lymphocytes Relative: 15 %
Lymphs Abs: 0.4 10*3/uL — ABNORMAL LOW (ref 0.7–4.0)
MCH: 30.8 pg (ref 26.0–34.0)
MCHC: 34 g/dL (ref 30.0–36.0)
MCV: 90.6 fL (ref 80.0–100.0)
Monocytes Absolute: 0.5 10*3/uL (ref 0.1–1.0)
Monocytes Relative: 19 %
Neutro Abs: 1.7 10*3/uL (ref 1.7–7.7)
Neutrophils Relative %: 60 %
Platelets: 122 10*3/uL — ABNORMAL LOW (ref 150–400)
RBC: 3.83 MIL/uL — ABNORMAL LOW (ref 3.87–5.11)
RDW: 16 % — ABNORMAL HIGH (ref 11.5–15.5)
WBC: 2.8 10*3/uL — ABNORMAL LOW (ref 4.0–10.5)
nRBC: 0 % (ref 0.0–0.2)

## 2019-02-08 LAB — COMPREHENSIVE METABOLIC PANEL
ALT: 15 U/L (ref 0–44)
AST: 12 U/L — ABNORMAL LOW (ref 15–41)
Albumin: 3.8 g/dL (ref 3.5–5.0)
Alkaline Phosphatase: 74 U/L (ref 38–126)
Anion gap: 10 (ref 5–15)
BUN: 21 mg/dL (ref 8–23)
CO2: 23 mmol/L (ref 22–32)
Calcium: 8.8 mg/dL — ABNORMAL LOW (ref 8.9–10.3)
Chloride: 102 mmol/L (ref 98–111)
Creatinine, Ser: 0.75 mg/dL (ref 0.44–1.00)
GFR calc Af Amer: >60 mL/min (ref >60)
GFR calc non Af Amer: >60 mL/min (ref >60)
Glucose, Bld: 167 mg/dL — ABNORMAL HIGH (ref 70–99)
Potassium: 4 mmol/L (ref 3.5–5.1)
Sodium: 135 mmol/L (ref 135–145)
Total Bilirubin: 0.5 mg/dL (ref 0.3–1.2)
Total Protein: 6.8 g/dL (ref 6.5–8.1)

## 2019-02-08 LAB — LIPID PANEL
Cholesterol: 112 mg/dL (ref 0–200)
HDL: 48 mg/dL (ref >40)
LDL Cholesterol: 31 mg/dL (ref 0–99)
Total CHOL/HDL Ratio: 2.3 RATIO
Triglycerides: 166 mg/dL — ABNORMAL HIGH (ref <150)
VLDL: 33 mg/dL (ref 0–40)

## 2019-02-08 LAB — LACTATE DEHYDROGENASE: LDH: 89 U/L — ABNORMAL LOW (ref 98–192)

## 2019-02-08 LAB — URIC ACID: Uric Acid, Serum: 3.3 mg/dL (ref 2.5–7.1)

## 2019-02-09 ENCOUNTER — Encounter: Payer: Self-pay | Admitting: Internal Medicine

## 2019-02-09 LAB — HEMOGLOBIN A1C
Hgb A1c MFr Bld: 6.3 % — ABNORMAL HIGH (ref 4.8–5.6)
Mean Plasma Glucose: 134 mg/dL

## 2019-02-18 ENCOUNTER — Telehealth: Payer: Self-pay | Admitting: Internal Medicine

## 2019-02-18 NOTE — Telephone Encounter (Signed)
Copied from [place] [id number]. Topic: Quick Communication - Rx Refill/Question >> Feb 18, 2019 10:45 AM Nathanial Millman J wrote: Medication: rosuvastatin (CRESTOR) 10 MG tablet  Has the patient contacted their pharmacy? Yes.   (Agent: If no, request that the patient contact the pharmacy for the refill.) (Agent: If yes, when and what did the pharmacy advise?)  Preferred Pharmacy (with phone number or street name): [facility] [address]   Agent: Please be advised that RX refills may take up to 3 business days. We ask that you follow-up with your pharmacy.

## 2019-02-19 ENCOUNTER — Other Ambulatory Visit: Payer: Self-pay

## 2019-02-19 MED ORDER — ROSUVASTATIN CALCIUM 10 MG PO TABS: ORAL_TABLET | ORAL | 2 refills | Status: DC

## 2019-02-19 NOTE — Telephone Encounter (Signed)
rx refilled.

## 2019-03-25 ENCOUNTER — Other Ambulatory Visit: Payer: Self-pay | Admitting: Internal Medicine

## 2019-04-05 ENCOUNTER — Other Ambulatory Visit: Payer: Self-pay

## 2019-04-05 ENCOUNTER — Other Ambulatory Visit: Admit: 2019-04-05 | Payer: Medicare Other

## 2019-04-08 ENCOUNTER — Other Ambulatory Visit: Payer: Self-pay

## 2019-04-08 ENCOUNTER — Other Ambulatory Visit
Admission: RE | Admit: 2019-04-08 | Discharge: 2019-04-08 | Disposition: A | Discharge status: Home / Self Care | Payer: Medicare Other | Attending: Internal Medicine | Admitting: Internal Medicine

## 2019-04-08 DIAGNOSIS — D46C Myelodysplastic syndrome with isolated del(5q) chromosomal abnormality: Secondary | ICD-10-CM | POA: Insufficient documentation

## 2019-04-08 LAB — CBC WITH DIFFERENTIAL/PLATELET
Abs Immature Granulocytes: 0.2 10*3/uL — ABNORMAL HIGH (ref 0.00–0.07)
Basophils Absolute: 0 10*3/uL (ref 0.0–0.1)
Basophils Relative: 1 %
Eosinophils Absolute: 0.1 10*3/uL (ref 0.0–0.5)
Eosinophils Relative: 3 %
HCT: 31.9 % — ABNORMAL LOW (ref 36.0–46.0)
Hemoglobin: 10.7 g/dL — ABNORMAL LOW (ref 12.0–15.0)
Immature Granulocytes: 7 %
Lymphocytes Relative: 16 %
Lymphs Abs: 0.5 10*3/uL — ABNORMAL LOW (ref 0.7–4.0)
MCH: 30.2 pg (ref 26.0–34.0)
MCHC: 33.5 g/dL (ref 30.0–36.0)
MCV: 90.1 fL (ref 80.0–100.0)
Monocytes Absolute: 0.6 10*3/uL (ref 0.1–1.0)
Monocytes Relative: 22 %
Neutro Abs: 1.5 10*3/uL — ABNORMAL LOW (ref 1.7–7.7)
Neutrophils Relative %: 51 %
Platelets: 115 10*3/uL — ABNORMAL LOW (ref 150–400)
RBC: 3.54 MIL/uL — ABNORMAL LOW (ref 3.87–5.11)
RDW: 16.4 % — ABNORMAL HIGH (ref 11.5–15.5)
WBC: 2.8 10*3/uL — ABNORMAL LOW (ref 4.0–10.5)
nRBC: 0 % (ref 0.0–0.2)

## 2019-04-12 ENCOUNTER — Encounter: Payer: Medicare Other | Admitting: Internal Medicine

## 2019-05-07 ENCOUNTER — Telehealth: Payer: Self-pay | Admitting: Internal Medicine

## 2019-05-07 MED ORDER — LOSARTAN POTASSIUM 25 MG PO TABS: 25.0000 mg | ORAL_TABLET | Freq: Two times a day (BID) | ORAL | 1 refills | Status: DC

## 2019-05-07 NOTE — Telephone Encounter (Signed)
Last OV:  07/16/18  Is this ok to fill?

## 2019-05-07 NOTE — Telephone Encounter (Signed)
REFILL losartan (COZAAR) 25 MG  PHARMACY [facility] [id number] - [place], [place] [address] AT [place] [phone] (Phone) [phone] (Fax)

## 2019-05-07 NOTE — Telephone Encounter (Signed)
rx sent in for losartan.  Has cpe 05/31/19.

## 2019-05-08 ENCOUNTER — Other Ambulatory Visit: Payer: Self-pay

## 2019-05-08 ENCOUNTER — Other Ambulatory Visit
Admission: RE | Admit: 2019-05-08 | Discharge: 2019-05-08 | Disposition: A | Discharge status: Home / Self Care | Payer: Medicare Other | Attending: Internal Medicine | Admitting: Internal Medicine

## 2019-05-08 DIAGNOSIS — D469 Myelodysplastic syndrome, unspecified: Secondary | ICD-10-CM | POA: Diagnosis present

## 2019-05-08 LAB — COMPREHENSIVE METABOLIC PANEL
ALT: 16 U/L (ref 0–44)
AST: 13 U/L — ABNORMAL LOW (ref 15–41)
Albumin: 3.8 g/dL (ref 3.5–5.0)
Alkaline Phosphatase: 78 U/L (ref 38–126)
Anion gap: 8 (ref 5–15)
BUN: 15 mg/dL (ref 8–23)
CO2: 26 mmol/L (ref 22–32)
Calcium: 9.1 mg/dL (ref 8.9–10.3)
Chloride: 102 mmol/L (ref 98–111)
Creatinine, Ser: 0.72 mg/dL (ref 0.44–1.00)
GFR calc Af Amer: >60 mL/min (ref >60)
GFR calc non Af Amer: >60 mL/min (ref >60)
Glucose, Bld: 127 mg/dL — ABNORMAL HIGH (ref 70–99)
Potassium: 3.9 mmol/L (ref 3.5–5.1)
Sodium: 136 mmol/L (ref 135–145)
Total Bilirubin: 0.6 mg/dL (ref 0.3–1.2)
Total Protein: 6.8 g/dL (ref 6.5–8.1)

## 2019-05-08 LAB — CBC WITH DIFFERENTIAL/PLATELET
Abs Immature Granulocytes: 0 10*3/uL (ref 0.00–0.07)
Basophils Absolute: 0 10*3/uL (ref 0.0–0.1)
Basophils Relative: 1 %
Eosinophils Absolute: 0.1 10*3/uL (ref 0.0–0.5)
Eosinophils Relative: 3 %
HCT: 33.6 % — ABNORMAL LOW (ref 36.0–46.0)
Hemoglobin: 11.3 g/dL — ABNORMAL LOW (ref 12.0–15.0)
Immature Granulocytes: 0 %
Lymphocytes Relative: 20 %
Lymphs Abs: 0.6 10*3/uL — ABNORMAL LOW (ref 0.7–4.0)
MCH: 30.1 pg (ref 26.0–34.0)
MCHC: 33.6 g/dL (ref 30.0–36.0)
MCV: 89.4 fL (ref 80.0–100.0)
Monocytes Absolute: 0.5 10*3/uL (ref 0.1–1.0)
Monocytes Relative: 17 %
Neutro Abs: 1.7 10*3/uL (ref 1.7–7.7)
Neutrophils Relative %: 59 %
Platelets: 128 10*3/uL — ABNORMAL LOW (ref 150–400)
RBC: 3.76 MIL/uL — ABNORMAL LOW (ref 3.87–5.11)
RDW: 16 % — ABNORMAL HIGH (ref 11.5–15.5)
WBC: 2.9 10*3/uL — ABNORMAL LOW (ref 4.0–10.5)
nRBC: 0 % (ref 0.0–0.2)

## 2019-05-08 LAB — PROTIME-INR
INR: 0.9 (ref 0.8–1.2)
Prothrombin Time: 12.1 seconds (ref 11.4–15.2)

## 2019-05-08 LAB — MAGNESIUM: Magnesium: 1.8 mg/dL (ref 1.7–2.4)

## 2019-05-29 ENCOUNTER — Other Ambulatory Visit: Payer: Self-pay

## 2019-05-31 ENCOUNTER — Other Ambulatory Visit: Payer: Self-pay

## 2019-05-31 ENCOUNTER — Ambulatory Visit (INDEPENDENT_AMBULATORY_CARE_PROVIDER_SITE_OTHER): Payer: Medicare Other | Admitting: Internal Medicine

## 2019-05-31 VITALS — BP 118/70 | HR 82 | Temp 96.8°F | Resp 16 | Wt 178.6 lb

## 2019-05-31 DIAGNOSIS — D469 Myelodysplastic syndrome, unspecified: Secondary | ICD-10-CM

## 2019-05-31 DIAGNOSIS — E041 Nontoxic single thyroid nodule: Secondary | ICD-10-CM

## 2019-05-31 DIAGNOSIS — E119 Type 2 diabetes mellitus without complications: Secondary | ICD-10-CM | POA: Diagnosis not present

## 2019-05-31 DIAGNOSIS — Z9109 Other allergy status, other than to drugs and biological substances: Secondary | ICD-10-CM | POA: Diagnosis not present

## 2019-05-31 DIAGNOSIS — Z0000 Encounter for general adult medical examination without abnormal findings: Secondary | ICD-10-CM | POA: Diagnosis not present

## 2019-05-31 DIAGNOSIS — I10 Essential (primary) hypertension: Secondary | ICD-10-CM

## 2019-05-31 DIAGNOSIS — Z23 Encounter for immunization: Secondary | ICD-10-CM | POA: Diagnosis not present

## 2019-05-31 DIAGNOSIS — E7800 Pure hypercholesterolemia, unspecified: Secondary | ICD-10-CM

## 2019-05-31 MED ORDER — METFORMIN HCL 1000 MG PO TABS: 1000.0000 mg | ORAL_TABLET | Freq: Two times a day (BID) | ORAL | 2 refills | Status: DC

## 2019-05-31 MED ORDER — SITAGLIPTIN PHOSPHATE 100 MG PO TABS: ORAL_TABLET | ORAL | 1 refills | Status: DC

## 2019-05-31 MED ORDER — ROSUVASTATIN CALCIUM 10 MG PO TABS: ORAL_TABLET | ORAL | 2 refills | Status: DC

## 2019-05-31 NOTE — Progress Notes (Signed)
Patient ID: Kara Griffin, female   DOB: Dec 18, 1935, 83 y.o.   MRN: [id number]   Subjective:    Patient ID: Kara Griffin, female    DOB: Nov 11, 1935, 83 y.o.   MRN: [id number]  HPI  Patient here for her physical exam.  She reports she is doing relatively well.  Feels good.  Trying to stay active.  Has not been sick. No fever.  No cough or congestion.  No chest pain.  No sob.  No acid reflux.  No abdominal pain.  Bowels moving.  Sees Dr Florene Glen.  On revlimid.  Has f/u next week.  Counts stable.  Blood sugars vary - stable.  Blood pressure doing well.     Past Medical History:  Diagnosis Date  . Anemia   . Diabetes mellitus ([place])   . Environmental allergies   . Hypercholesterolemia   . Hypertension   . Myelodysplastic syndrome ([place])    Sees Dr Jerrye Noble   Past Surgical History:  Procedure Laterality Date  . ABDOMINAL HYSTERECTOMY    . [place]  . DILATION AND CURETTAGE OF UTERUS    . TONSILECTOMY, ADENOIDECTOMY, BILATERAL MYRINGOTOMY AND TUBES  1963  . TOTAL ABDOMINAL HYSTERECTOMY W/ BILATERAL SALPINGOOPHORECTOMY  1985   secondary to abnormal cells and abnormal uterine bleeding   Family History  Problem Relation Age of Onset  . Heart disease Father        Deceased (MI) - 75  . Diabetes Mother        Deceased  . Thyroid cancer Mother   . Osteoarthritis Mother   . Asthma Other   . Diabetes Brother   . Heart disease Maternal Grandmother        myocardial infarction - 93  . Asthma Maternal Grandmother   . Heart disease Paternal Grandmother        myocardial infarction-52  . Heart disease Brother   . Sinusitis Brother   . Depression Son   . Diabetes Son   . Depression Son   . Diabetes Son   . Depression Son   . Breast cancer Neg Hx    Social History   Socioeconomic History  . Marital status: Divorced    Spouse name: Not on file  . Number of children: 3  . Years of education: Not on file  . Highest education level: Not on file   Occupational History  . Occupation: retired Games developer  Social Needs  . Financial resource strain: Not on file  . Food insecurity    Worry: Not on file    Inability: Not on file  . Transportation needs    Medical: Not on file    Non-medical: Not on file  Tobacco Use  . Smoking status: Never Smoker  . Smokeless tobacco: Never Used  Substance and Sexual Activity  . Alcohol use: No    Alcohol/week: 0.0 standard drinks  . Drug use: No  . Sexual activity: Never  Lifestyle  . Physical activity    Days per week: Not on file    Minutes per session: Not on file  . Stress: Not on file  Relationships  . Social Herbalist on phone: Not on file    Gets together: Not on file    Attends religious service: Not on file    Active member of club or organization: Not on file    Attends meetings of clubs or organizations: Not on file    Relationship status: Not on  file  Other Topics Concern  . Not on file  Social History Narrative  . Not on file    Outpatient Encounter Medications as of 05/31/2019  Medication Sig  . aspirin 81 MG tablet Take 81 mg by mouth daily.  . bevacizumab (AVASTIN) 400 MG/16ML SOLN by Intravitreal route.  . Calcium Carbonate-Vitamin D (CALCIUM 600+D) 600-400 MG-UNIT per tablet Take 1 tablet by mouth daily. Reported on 01/15/2016  . cyanocobalamin 500 MCG tablet Take 1,000 mcg by mouth daily.   . fluticasone (FLONASE) 50 MCG/ACT nasal spray Place 2 sprays into both nostrils daily.  Marland Kitchen lenalidomide (REVLIMID) 10 MG capsule Take 10 mg by mouth daily.  Marland Kitchen losartan (COZAAR) 25 MG tablet Take 1 tablet (25 mg total) by mouth 2 (two) times daily.  . metFORMIN (GLUCOPHAGE) 1000 MG tablet Take 1 tablet (1,000 mg total) by mouth 2 (two) times daily.  . Multiple Vitamin (MULTIVITAMIN) tablet Take 1 tablet by mouth daily.  . Multiple Vitamins-Minerals (PRESERVISION AREDS 2+MULTI VIT) CAPS Take 1 capsule by mouth 2 (two) times daily.  Marland Kitchen nystatin cream (MYCOSTATIN) Apply 1  application topically 2 (two) times daily. (Patient taking differently: Apply 1 application topically 2 (two) times daily as needed. )  . ONE TOUCH LANCETS MISC Use to check sugars 1-2 times a day Dx. E11.9 (One Touch Ultra Mini)  . ONETOUCH ULTRA test strip USE TO TEST BLOOD SUGAR TWICE DAILY AS DIRECTED  . rosuvastatin (CRESTOR) 10 MG tablet TAKE 1 TABLET(10 MG) BY MOUTH DAILY  . sitaGLIPtin (JANUVIA) 100 MG tablet TAKE 1 TABLET BY MOUTH DAILY  . [DISCONTINUED] doxycycline (VIBRAMYCIN) 100 MG capsule Take 1 capsule (100 mg total) by mouth 2 (two) times daily. (Patient not taking: Reported on 07/16/2018)  . [DISCONTINUED] HYDROcodone-acetaminophen (NORCO/VICODIN) 5-325 MG tablet 1 tab po q 8 hours prn (Patient not taking: Reported on 07/16/2018)  . [DISCONTINUED] metFORMIN (GLUCOPHAGE) 1000 MG tablet TAKE 1 TABLET BY MOUTH TWICE DAILY  . [DISCONTINUED] rosuvastatin (CRESTOR) 10 MG tablet TAKE 1 TABLET(10 MG) BY MOUTH DAILY  . [DISCONTINUED] sitaGLIPtin (JANUVIA) 100 MG tablet TAKE 1 TABLET BY MOUTH DAILY   No facility-administered encounter medications on file as of 05/31/2019.     Review of Systems  Constitutional: Negative for appetite change and unexpected weight change.  HENT: Negative for congestion and sinus pressure.   Eyes: Negative for pain and visual disturbance.  Respiratory: Negative for cough, chest tightness and shortness of breath.   Cardiovascular: Negative for chest pain, palpitations and leg swelling.  Gastrointestinal: Negative for abdominal pain, diarrhea, nausea and vomiting.  Genitourinary: Negative for difficulty urinating and dysuria.  Musculoskeletal: Negative for joint swelling and myalgias.  Skin: Negative for color change and rash.  Neurological: Negative for dizziness, light-headedness and headaches.  Hematological: Negative for adenopathy. Does not bruise/bleed easily.  Psychiatric/Behavioral: Negative for agitation and dysphoric mood.       Objective:     Physical Exam Constitutional:      General: She is not in acute distress.    Appearance: Normal appearance. She is well-developed.  Eyes:     General: No scleral icterus.       Right eye: No discharge.        Left eye: No discharge.  Neck:     Musculoskeletal: Neck supple.     Thyroid: No thyromegaly.  Cardiovascular:     Rate and Rhythm: Normal rate and regular rhythm.  Pulmonary:     Effort: No tachypnea, accessory muscle usage or respiratory distress.  Breath sounds: Normal breath sounds. No decreased breath sounds or wheezing.  Chest:     Breasts:        Right: No inverted nipple, mass, nipple discharge or tenderness (no axillary adenopathy).        Left: No inverted nipple, mass, nipple discharge or tenderness (no axilarry adenopathy).  Abdominal:     General: Bowel sounds are normal.     Palpations: Abdomen is soft.     Tenderness: There is no abdominal tenderness.  Musculoskeletal:        General: No swelling or tenderness.  Lymphadenopathy:     Cervical: No cervical adenopathy.  Skin:    Findings: No erythema or rash.  Neurological:     Mental Status: She is alert and oriented to person, place, and time.  Psychiatric:        Mood and Affect: Mood normal.        Behavior: Behavior normal.     BP 118/70   Pulse 82   Temp (!) 96.8 F (36 C)   Resp 16   Wt 178 lb 9.6 oz (81 kg)   LMP 08/13/1984   SpO2 99%   BMI 33.75 kg/m  Wt Readings from Last 3 Encounters:  05/31/19 178 lb 9.6 oz (81 kg)  07/16/18 178 lb 8 oz (81 kg)  01/08/18 176 lb (79.8 kg)     Lab Results  Component Value Date   WBC 2.9 (L) 05/08/2019   HGB 11.3 (L) 05/08/2019   HCT 33.6 (L) 05/08/2019   PLT 128 (L) 05/08/2019   GLUCOSE 127 (H) 05/08/2019   CHOL 112 02/08/2019   TRIG 166 (H) 02/08/2019   HDL 48 02/08/2019   LDLDIRECT 85.3 08/23/2013   LDLCALC 31 02/08/2019   ALT 16 05/08/2019   AST 13 (L) 05/08/2019   NA 136 05/08/2019   K 3.9 05/08/2019   CL 102 05/08/2019    CREATININE 0.72 05/08/2019   BUN 15 05/08/2019   CO2 26 05/08/2019   TSH 1.586 02/20/2017   INR 0.9 05/08/2019   HGBA1C 6.3 (H) 02/08/2019   MICROALBUR 1.0 01/16/2015       Assessment & Plan:   Problem List Items Addressed This Visit    Diabetes mellitus ([place])    Low carb diet and exercise.  Follow met b and a1c.       Relevant Medications   metFORMIN (GLUCOPHAGE) 1000 MG tablet   sitaGLIPtin (JANUVIA) 100 MG tablet   rosuvastatin (CRESTOR) 10 MG tablet   Environmental allergies    Controlled.        Health care maintenance    Physical today 05/31/19.  Mammogram 12/13/17 -Birads I.  Schedule f/u mammogram.  cologuard 10/07/15 - negative.        Hypercholesterolemia    Low cholesterol diet and exercise.  Follow lipid panel.        Relevant Medications   rosuvastatin (CRESTOR) 10 MG tablet   Hypertension    Blood pressure under good control.  Continue same medication regimen.  Follow pressures.  Follow metabolic panel.        Relevant Medications   rosuvastatin (CRESTOR) 10 MG tablet   Myelodysplastic syndrome ([place])    Followed by Dr Florene Glen.        Thyroid nodule    Worked up by Dr Harlow Asa.  Follow tsh.         Other Visit Diagnoses    Need for immunization against influenza       Relevant  Orders   Flu Vaccine QUAD High Dose(Fluad) (Completed)       Einar Pheasant, MD

## 2019-05-31 NOTE — Assessment & Plan Note (Signed)
Physical today 05/31/19.  Mammogram 12/13/17 -Birads I.  Schedule f/u mammogram.  cologuard 10/07/15 - negative.

## 2019-06-02 ENCOUNTER — Encounter: Payer: Self-pay | Admitting: Internal Medicine

## 2019-06-02 NOTE — Assessment & Plan Note (Signed)
Low carb diet and exercise.  Follow met b and a1c.  

## 2019-06-02 NOTE — Assessment & Plan Note (Signed)
Low cholesterol diet and exercise.  Follow lipid panel.   

## 2019-06-02 NOTE — Assessment & Plan Note (Signed)
Followed by Dr Powell 

## 2019-06-02 NOTE — Assessment & Plan Note (Signed)
Worked up by Dr Gerkin.  Follow tsh.  

## 2019-06-02 NOTE — Assessment & Plan Note (Signed)
Blood pressure under good control.  Continue same medication regimen.  Follow pressures.  Follow metabolic panel.   

## 2019-06-02 NOTE — Assessment & Plan Note (Signed)
Controlled.  

## 2019-07-03 ENCOUNTER — Other Ambulatory Visit
Admission: RE | Admit: 2019-07-03 | Discharge: 2019-07-03 | Disposition: A | Discharge status: Home / Self Care | Payer: Medicare Other | Attending: Hematology & Oncology | Admitting: Hematology & Oncology

## 2019-07-03 ENCOUNTER — Other Ambulatory Visit: Payer: Self-pay

## 2019-07-03 DIAGNOSIS — D46C Myelodysplastic syndrome with isolated del(5q) chromosomal abnormality: Secondary | ICD-10-CM | POA: Insufficient documentation

## 2019-07-03 LAB — CBC WITH DIFFERENTIAL/PLATELET
Abs Immature Granulocytes: 0.1 10*3/uL — ABNORMAL HIGH (ref 0.00–0.07)
Band Neutrophils: 4 %
Basophils Absolute: 0.1 10*3/uL (ref 0.0–0.1)
Basophils Relative: 2 %
Eosinophils Absolute: 0.1 10*3/uL (ref 0.0–0.5)
Eosinophils Relative: 2 %
HCT: 33.8 % — ABNORMAL LOW (ref 36.0–46.0)
Hemoglobin: 11.3 g/dL — ABNORMAL LOW (ref 12.0–15.0)
Lymphocytes Relative: 12 %
Lymphs Abs: 0.3 10*3/uL — ABNORMAL LOW (ref 0.7–4.0)
MCH: 29.7 pg (ref 26.0–34.0)
MCHC: 33.4 g/dL (ref 30.0–36.0)
MCV: 88.9 fL (ref 80.0–100.0)
Metamyelocytes Relative: 3 %
Monocytes Absolute: 0.2 10*3/uL (ref 0.1–1.0)
Monocytes Relative: 7 %
Neutro Abs: 2.1 10*3/uL (ref 1.7–7.7)
Neutrophils Relative %: 70 %
Platelets: 118 10*3/uL — ABNORMAL LOW (ref 150–400)
RBC: 3.8 MIL/uL — ABNORMAL LOW (ref 3.87–5.11)
RDW: 15.9 % — ABNORMAL HIGH (ref 11.5–15.5)
WBC: 2.9 10*3/uL — ABNORMAL LOW (ref 4.0–10.5)
nRBC: 0 % (ref 0.0–0.2)

## 2019-07-04 ENCOUNTER — Other Ambulatory Visit: Payer: Self-pay | Admitting: Internal Medicine

## 2019-07-04 MED ORDER — LOSARTAN POTASSIUM 25 MG PO TABS: 25.0000 mg | ORAL_TABLET | Freq: Two times a day (BID) | ORAL | 1 refills | Status: DC

## 2019-07-04 NOTE — Telephone Encounter (Signed)
Pt has called  [facility] and needs a refill on losartan. [facility] [address]

## 2019-07-29 ENCOUNTER — Other Ambulatory Visit
Admission: RE | Admit: 2019-07-29 | Discharge: 2019-07-29 | Disposition: A | Discharge status: Home / Self Care | Payer: Medicare Other | Attending: Internal Medicine | Admitting: Internal Medicine

## 2019-07-29 ENCOUNTER — Other Ambulatory Visit: Payer: Self-pay

## 2019-07-29 DIAGNOSIS — D469 Myelodysplastic syndrome, unspecified: Secondary | ICD-10-CM | POA: Insufficient documentation

## 2019-07-29 LAB — CBC WITH DIFFERENTIAL/PLATELET
Abs Immature Granulocytes: 0.02 10*3/uL (ref 0.00–0.07)
Basophils Absolute: 0.1 10*3/uL (ref 0.0–0.1)
Basophils Relative: 2 %
Eosinophils Absolute: 0.1 10*3/uL (ref 0.0–0.5)
Eosinophils Relative: 2 %
HCT: 33.4 % — ABNORMAL LOW (ref 36.0–46.0)
Hemoglobin: 11.1 g/dL — ABNORMAL LOW (ref 12.0–15.0)
Immature Granulocytes: 1 %
Lymphocytes Relative: 17 %
Lymphs Abs: 0.5 10*3/uL — ABNORMAL LOW (ref 0.7–4.0)
MCH: 30 pg (ref 26.0–34.0)
MCHC: 33.2 g/dL (ref 30.0–36.0)
MCV: 90.3 fL (ref 80.0–100.0)
Monocytes Absolute: 0.5 10*3/uL (ref 0.1–1.0)
Monocytes Relative: 17 %
Neutro Abs: 1.8 10*3/uL (ref 1.7–7.7)
Neutrophils Relative %: 61 %
Platelets: 113 10*3/uL — ABNORMAL LOW (ref 150–400)
RBC: 3.7 MIL/uL — ABNORMAL LOW (ref 3.87–5.11)
RDW: 15.9 % — ABNORMAL HIGH (ref 11.5–15.5)
WBC: 2.9 10*3/uL — ABNORMAL LOW (ref 4.0–10.5)
nRBC: 0 % (ref 0.0–0.2)

## 2019-08-06 ENCOUNTER — Other Ambulatory Visit: Payer: Self-pay

## 2019-08-06 MED ORDER — LOSARTAN POTASSIUM 25 MG PO TABS: 25.0000 mg | ORAL_TABLET | Freq: Two times a day (BID) | ORAL | 1 refills | Status: DC

## 2019-09-16 DIAGNOSIS — H353211 Exudative age-related macular degeneration, right eye, with active choroidal neovascularization: Secondary | ICD-10-CM | POA: Diagnosis not present

## 2019-09-16 DIAGNOSIS — H40053 Ocular hypertension, bilateral: Secondary | ICD-10-CM | POA: Diagnosis not present

## 2019-09-16 LAB — HM DIABETES EYE EXAM

## 2019-09-24 ENCOUNTER — Telehealth: Payer: Self-pay | Admitting: Internal Medicine

## 2019-09-24 NOTE — Telephone Encounter (Signed)
Pt had a question about her Lancets and test strips

## 2019-09-25 ENCOUNTER — Other Ambulatory Visit: Payer: Self-pay

## 2019-09-25 ENCOUNTER — Other Ambulatory Visit
Admission: RE | Admit: 2019-09-25 | Discharge: 2019-09-25 | Disposition: A | Discharge status: Home / Self Care | Payer: Medicare PPO | Attending: Hematology & Oncology | Admitting: Hematology & Oncology

## 2019-09-25 DIAGNOSIS — D46C Myelodysplastic syndrome with isolated del(5q) chromosomal abnormality: Secondary | ICD-10-CM | POA: Insufficient documentation

## 2019-09-25 LAB — COMPREHENSIVE METABOLIC PANEL WITH GFR
ALT: 14 U/L (ref 0–44)
AST: 13 U/L — ABNORMAL LOW (ref 15–41)
Albumin: 3.5 g/dL (ref 3.5–5.0)
Alkaline Phosphatase: 67 U/L (ref 38–126)
Anion gap: 7 (ref 5–15)
BUN: 18 mg/dL (ref 8–23)
CO2: 25 mmol/L (ref 22–32)
Calcium: 8.6 mg/dL — ABNORMAL LOW (ref 8.9–10.3)
Chloride: 102 mmol/L (ref 98–111)
Creatinine, Ser: 0.71 mg/dL (ref 0.44–1.00)
GFR calc Af Amer: >60 mL/min
GFR calc non Af Amer: >60 mL/min
Glucose, Bld: 177 mg/dL — ABNORMAL HIGH (ref 70–99)
Potassium: 3.7 mmol/L (ref 3.5–5.1)
Sodium: 134 mmol/L — ABNORMAL LOW (ref 135–145)
Total Bilirubin: 0.5 mg/dL (ref 0.3–1.2)
Total Protein: 6.5 g/dL (ref 6.5–8.1)

## 2019-09-25 LAB — CBC WITH DIFFERENTIAL/PLATELET
Abs Immature Granulocytes: 0.04 10*3/uL (ref 0.00–0.07)
Basophils Absolute: 0.1 10*3/uL (ref 0.0–0.1)
Basophils Relative: 2 %
Eosinophils Absolute: 0.1 10*3/uL (ref 0.0–0.5)
Eosinophils Relative: 3 %
HCT: 32.7 % — ABNORMAL LOW (ref 36.0–46.0)
Hemoglobin: 10.8 g/dL — ABNORMAL LOW (ref 12.0–15.0)
Immature Granulocytes: 1 %
Lymphocytes Relative: 18 %
Lymphs Abs: 0.6 10*3/uL — ABNORMAL LOW (ref 0.7–4.0)
MCH: 29.3 pg (ref 26.0–34.0)
MCHC: 33 g/dL (ref 30.0–36.0)
MCV: 88.9 fL (ref 80.0–100.0)
Monocytes Absolute: 0.6 10*3/uL (ref 0.1–1.0)
Monocytes Relative: 16 %
Neutro Abs: 2.2 10*3/uL (ref 1.7–7.7)
Neutrophils Relative %: 60 %
Platelets: 136 10*3/uL — ABNORMAL LOW (ref 150–400)
RBC: 3.68 MIL/uL — ABNORMAL LOW (ref 3.87–5.11)
RDW: 15.8 % — ABNORMAL HIGH (ref 11.5–15.5)
WBC: 3.6 10*3/uL — ABNORMAL LOW (ref 4.0–10.5)
nRBC: 0 % (ref 0.0–0.2)

## 2019-09-25 MED ORDER — BLOOD GLUCOSE MONITOR KIT: PACK | 0 refills | Status: DC

## 2019-09-27 ENCOUNTER — Other Ambulatory Visit: Payer: Self-pay

## 2019-09-27 MED ORDER — BLOOD GLUCOSE MONITOR KIT: PACK | 0 refills | Status: AC

## 2019-09-27 NOTE — Telephone Encounter (Signed)
Rx for glucometer printed for signature

## 2019-10-03 ENCOUNTER — Other Ambulatory Visit: Payer: Self-pay

## 2019-10-03 MED ORDER — LOSARTAN POTASSIUM 25 MG PO TABS: 25.0000 mg | ORAL_TABLET | Freq: Two times a day (BID) | ORAL | 1 refills | Status: DC

## 2019-10-09 ENCOUNTER — Ambulatory Visit (INDEPENDENT_AMBULATORY_CARE_PROVIDER_SITE_OTHER): Payer: Medicare PPO | Admitting: Internal Medicine

## 2019-10-09 ENCOUNTER — Encounter: Payer: Self-pay | Admitting: Internal Medicine

## 2019-10-09 ENCOUNTER — Other Ambulatory Visit: Payer: Self-pay

## 2019-10-09 DIAGNOSIS — E7800 Pure hypercholesterolemia, unspecified: Secondary | ICD-10-CM | POA: Diagnosis not present

## 2019-10-09 DIAGNOSIS — E1165 Type 2 diabetes mellitus with hyperglycemia: Secondary | ICD-10-CM | POA: Diagnosis not present

## 2019-10-09 DIAGNOSIS — I10 Essential (primary) hypertension: Secondary | ICD-10-CM | POA: Diagnosis not present

## 2019-10-09 DIAGNOSIS — R42 Dizziness and giddiness: Secondary | ICD-10-CM

## 2019-10-09 DIAGNOSIS — D469 Myelodysplastic syndrome, unspecified: Secondary | ICD-10-CM

## 2019-10-09 DIAGNOSIS — Z9109 Other allergy status, other than to drugs and biological substances: Secondary | ICD-10-CM

## 2019-10-09 MED ORDER — AZELASTINE HCL 0.1 % NA SOLN: 1.0000 | Freq: Two times a day (BID) | NASAL | 1 refills | Status: DC

## 2019-10-09 NOTE — Progress Notes (Signed)
Patient ID: Kara Griffin, female   DOB: November 29, 1935, 84 y.o.   MRN: [id number]   Virtual Visit via telephone Note  This visit type was conducted due to national recommendations for restrictions regarding the COVID-19 pandemic (e.g. social distancing).  This format is felt to be most appropriate for this patient at this time.  All issues noted in this document were discussed and addressed.  No physical exam was performed (except for noted visual exam findings with Video Visits).   I connected with Maryanna Shape by telephone and verified that I am speaking with the correct person using two identifiers. Location patient: home Location provider: work  Persons participating in the telephone visit: patient, provider  The limitations, risks, security and privacy concerns of performing an evaluation and management service by telephone and the availability of in person appointments have been discussed. The patient expressed understanding and agreed to proceed.   Reason for visit:  Scheduled follow up.   HPI: She reports she is doing relatively well.  Reports sinus drainage.  Has tried flonase/nasacort.  Back on flonase now.  Still with persistent issues.  No headache.  No chest congestion.  Breathing stable.  Mild dizziness if turns fast or if bends over.  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  Lifted up fast.  Felt dizzy and weak.  This occurred two weeks ago.  Has not occurred since.  Eating.  No vomiting.  Bowels stable.  States am sugars averaging 160-180.  Discussed diet.  Arthritis - left shoulder.  Received covid vaccine 10/03/19.  Due to get second 10/25/19.     ROS: See pertinent positives and negatives per HPI.  Past Medical History:  Diagnosis Date  . Anemia   . Diabetes mellitus ([place])   . Environmental allergies   . Hypercholesterolemia   . Hypertension   . Myelodysplastic syndrome ([place])    Sees Dr Jerrye Noble    Past Surgical History:  Procedure Laterality  Date  . ABDOMINAL HYSTERECTOMY    . [place]  . DILATION AND CURETTAGE OF UTERUS    . TONSILECTOMY, ADENOIDECTOMY, BILATERAL MYRINGOTOMY AND TUBES  1963  . TOTAL ABDOMINAL HYSTERECTOMY W/ BILATERAL SALPINGOOPHORECTOMY  1985   secondary to abnormal cells and abnormal uterine bleeding    Family History  Problem Relation Age of Onset  . Heart disease Father        Deceased (MI) - 18  . Diabetes Mother        Deceased  . Thyroid cancer Mother   . Osteoarthritis Mother   . Asthma Other   . Diabetes Brother   . Heart disease Maternal Grandmother        myocardial infarction - 84  . Asthma Maternal Grandmother   . Heart disease Paternal Grandmother        myocardial infarction-52  . Heart disease Brother   . Sinusitis Brother   . Depression Son   . Diabetes Son   . Depression Son   . Diabetes Son   . Depression Son   . Breast cancer Neg Hx     SOCIAL HX: reviewed.    Current Outpatient Medications:  .  aspirin 81 MG tablet, Take 81 mg by mouth daily., Disp: , Rfl:  .  azelastine (ASTELIN) 0.1 % nasal spray, Place 1 spray into both nostrils 2 (two) times daily. Use in each nostril as directed, Disp: 30 mL, Rfl: 1 .  bevacizumab (AVASTIN) 400 MG/16ML SOLN, by Intravitreal route.,  Disp: , Rfl:  .  blood glucose meter kit and supplies KIT, Dispense based on patient and insurance preference. Use to check sugars twice daily. DX E11.9, Disp: 1 each, Rfl: 0 .  Calcium Carbonate-Vitamin D (CALCIUM 600+D) 600-400 MG-UNIT per tablet, Take 1 tablet by mouth daily. Reported on 01/15/2016, Disp: , Rfl:  .  cyanocobalamin 500 MCG tablet, Take 1,000 mcg by mouth daily. , Disp: , Rfl:  .  fluticasone (FLONASE) 50 MCG/ACT nasal spray, Place 2 sprays into both nostrils daily., Disp: , Rfl:  .  lenalidomide (REVLIMID) 10 MG capsule, Take 10 mg by mouth daily., Disp: , Rfl:  .  losartan (COZAAR) 25 MG tablet, Take 1 tablet (25 mg total) by mouth 2 (two) times daily., Disp: 60 tablet,  Rfl: 1 .  metFORMIN (GLUCOPHAGE) 1000 MG tablet, Take 1 tablet (1,000 mg total) by mouth 2 (two) times daily., Disp: 180 tablet, Rfl: 2 .  Multiple Vitamin (MULTIVITAMIN) tablet, Take 1 tablet by mouth daily., Disp: , Rfl:  .  Multiple Vitamins-Minerals (PRESERVISION AREDS 2+MULTI VIT) CAPS, Take 1 capsule by mouth 2 (two) times daily., Disp: , Rfl:  .  nystatin cream (MYCOSTATIN), Apply 1 application topically 2 (two) times daily. (Patient taking differently: Apply 1 application topically 2 (two) times daily as needed. ), Disp: 45 g, Rfl: 0 .  ONE TOUCH LANCETS MISC, Use to check sugars 1-2 times a day Dx. E11.9 (One Touch Ultra Mini), Disp: 100 each, Rfl: 3 .  ONETOUCH ULTRA test strip, USE TO TEST BLOOD SUGAR TWICE DAILY AS DIRECTED, Disp: 100 strip, Rfl: 4 .  rosuvastatin (CRESTOR) 10 MG tablet, TAKE 1 TABLET(10 MG) BY MOUTH DAILY, Disp: 90 tablet, Rfl: 2 .  sitaGLIPtin (JANUVIA) 100 MG tablet, TAKE 1 TABLET BY MOUTH DAILY, Disp: 90 tablet, Rfl: 1  EXAM:  GENERAL: alert.  Appears to be in no acute distress.  Answering questions appropriately.    PSYCH/NEURO: pleasant and cooperative, no obvious depression or anxiety, speech and thought processing grossly intact  ASSESSMENT AND PLAN:  Discussed the following assessment and plan:  Diabetes mellitus Low carb diet and exercise. Sugars as outlined.  Follow met b and a1c.   Dizziness Previously saw ENT.  S/p epley maneuvers.  Previously improved.  Intermittent dizziness as outlined.  Had the episode at the hair dresser.  No further episodes like that since.  Slow position changes and movements.  Wants to monitor.    Environmental allergies Persistent nasal congestion.  Trial of astelin nasal spray.  Follow.   Hypercholesterolemia Low cholesterol diet and exercise.  Follow lipid panel.    Hypertension Blood pressure under good control.  Continue same medication regimen.  Follow pressures.  Follow metabolic panel.    Myelodysplastic  syndrome Followed by hematology - dr Florene Glen.  Stable.    No orders of the defined types were placed in this encounter.   Meds ordered this encounter  Medications  . azelastine (ASTELIN) 0.1 % nasal spray    Sig: Place 1 spray into both nostrils 2 (two) times daily. Use in each nostril as directed    Dispense:  30 mL    Refill:  1     I discussed the assessment and treatment plan with the patient. The patient was provided an opportunity to ask questions and all were answered. The patient agreed with the plan and demonstrated an understanding of the instructions.   The patient was advised to call back or seek an in-person evaluation if the symptoms  worsen or if the condition fails to improve as anticipated.  I provided 22 minutes of non-face-to-face time during this encounter.   Einar Pheasant, MD

## 2019-10-13 ENCOUNTER — Encounter: Payer: Self-pay | Admitting: Internal Medicine

## 2019-10-13 NOTE — Assessment & Plan Note (Signed)
Followed by hematology - dr Florene Glen.  Stable.

## 2019-10-13 NOTE — Assessment & Plan Note (Signed)
Low cholesterol diet and exercise.  Follow lipid panel.   

## 2019-10-13 NOTE — Assessment & Plan Note (Signed)
Low carb diet and exercise.  Sugars as outlined.  Follow met b and a1c.   

## 2019-10-13 NOTE — Assessment & Plan Note (Signed)
Persistent nasal congestion.  Trial of astelin nasal spray.  Follow.

## 2019-10-13 NOTE — Assessment & Plan Note (Signed)
Previously saw ENT.  S/p epley maneuvers.  Previously improved.  Intermittent dizziness as outlined.  Had the episode at the hair dresser.  No further episodes like that since.  Slow position changes and movements.  Wants to monitor.

## 2019-10-13 NOTE — Assessment & Plan Note (Signed)
Blood pressure under good control.  Continue same medication regimen.  Follow pressures.  Follow metabolic panel.   

## 2019-10-21 DIAGNOSIS — H353211 Exudative age-related macular degeneration, right eye, with active choroidal neovascularization: Secondary | ICD-10-CM | POA: Diagnosis not present

## 2019-10-23 ENCOUNTER — Other Ambulatory Visit: Payer: Self-pay

## 2019-10-23 ENCOUNTER — Other Ambulatory Visit
Admission: RE | Admit: 2019-10-23 | Discharge: 2019-10-23 | Disposition: A | Discharge status: Home / Self Care | Payer: Medicare PPO | Attending: Hematology & Oncology | Admitting: Hematology & Oncology

## 2019-10-23 DIAGNOSIS — D46C Myelodysplastic syndrome with isolated del(5q) chromosomal abnormality: Secondary | ICD-10-CM | POA: Diagnosis not present

## 2019-10-23 LAB — COMPREHENSIVE METABOLIC PANEL
ALT: 16 U/L (ref 0–44)
AST: 13 U/L — ABNORMAL LOW (ref 15–41)
Albumin: 3.7 g/dL (ref 3.5–5.0)
Alkaline Phosphatase: 72 U/L (ref 38–126)
Anion gap: 7 (ref 5–15)
BUN: 18 mg/dL (ref 8–23)
CO2: 29 mmol/L (ref 22–32)
Calcium: 9 mg/dL (ref 8.9–10.3)
Chloride: 100 mmol/L (ref 98–111)
Creatinine, Ser: 0.81 mg/dL (ref 0.44–1.00)
GFR calc Af Amer: >60 mL/min (ref >60)
GFR calc non Af Amer: >60 mL/min (ref >60)
Glucose, Bld: 142 mg/dL — ABNORMAL HIGH (ref 70–99)
Potassium: 3.9 mmol/L (ref 3.5–5.1)
Sodium: 136 mmol/L (ref 135–145)
Total Bilirubin: 0.5 mg/dL (ref 0.3–1.2)
Total Protein: 6.6 g/dL (ref 6.5–8.1)

## 2019-10-23 LAB — CBC WITH DIFFERENTIAL/PLATELET
Abs Immature Granulocytes: 0.04 10*3/uL (ref 0.00–0.07)
Basophils Absolute: 0.1 10*3/uL (ref 0.0–0.1)
Basophils Relative: 2 %
Eosinophils Absolute: 0.1 10*3/uL (ref 0.0–0.5)
Eosinophils Relative: 3 %
HCT: 33.6 % — ABNORMAL LOW (ref 36.0–46.0)
Hemoglobin: 11.2 g/dL — ABNORMAL LOW (ref 12.0–15.0)
Immature Granulocytes: 1 %
Lymphocytes Relative: 18 %
Lymphs Abs: 0.6 10*3/uL — ABNORMAL LOW (ref 0.7–4.0)
MCH: 29.7 pg (ref 26.0–34.0)
MCHC: 33.3 g/dL (ref 30.0–36.0)
MCV: 89.1 fL (ref 80.0–100.0)
Monocytes Absolute: 0.4 10*3/uL (ref 0.1–1.0)
Monocytes Relative: 14 %
Neutro Abs: 1.9 10*3/uL (ref 1.7–7.7)
Neutrophils Relative %: 62 %
Platelets: 123 10*3/uL — ABNORMAL LOW (ref 150–400)
RBC: 3.77 MIL/uL — ABNORMAL LOW (ref 3.87–5.11)
RDW: 15.9 % — ABNORMAL HIGH (ref 11.5–15.5)
WBC: 3.1 10*3/uL — ABNORMAL LOW (ref 4.0–10.5)
nRBC: 0 % (ref 0.0–0.2)

## 2019-11-13 ENCOUNTER — Encounter: Payer: Self-pay | Admitting: Internal Medicine

## 2019-11-23 ENCOUNTER — Other Ambulatory Visit: Payer: Self-pay | Admitting: Internal Medicine

## 2019-11-25 DIAGNOSIS — H353211 Exudative age-related macular degeneration, right eye, with active choroidal neovascularization: Secondary | ICD-10-CM | POA: Diagnosis not present

## 2019-12-03 DIAGNOSIS — H26491 Other secondary cataract, right eye: Secondary | ICD-10-CM | POA: Diagnosis not present

## 2019-12-17 DIAGNOSIS — Z7984 Long term (current) use of oral hypoglycemic drugs: Secondary | ICD-10-CM | POA: Diagnosis not present

## 2019-12-17 DIAGNOSIS — Z79899 Other long term (current) drug therapy: Secondary | ICD-10-CM | POA: Diagnosis not present

## 2019-12-17 DIAGNOSIS — D46C Myelodysplastic syndrome with isolated del(5q) chromosomal abnormality: Secondary | ICD-10-CM | POA: Diagnosis not present

## 2019-12-17 DIAGNOSIS — I10 Essential (primary) hypertension: Secondary | ICD-10-CM | POA: Diagnosis not present

## 2019-12-17 DIAGNOSIS — E785 Hyperlipidemia, unspecified: Secondary | ICD-10-CM | POA: Diagnosis not present

## 2019-12-17 DIAGNOSIS — E119 Type 2 diabetes mellitus without complications: Secondary | ICD-10-CM | POA: Diagnosis not present

## 2019-12-17 DIAGNOSIS — H3530 Unspecified macular degeneration: Secondary | ICD-10-CM | POA: Diagnosis not present

## 2019-12-28 ENCOUNTER — Other Ambulatory Visit: Payer: Self-pay | Admitting: Internal Medicine

## 2019-12-31 DIAGNOSIS — H353211 Exudative age-related macular degeneration, right eye, with active choroidal neovascularization: Secondary | ICD-10-CM | POA: Diagnosis not present

## 2020-01-06 DIAGNOSIS — H26491 Other secondary cataract, right eye: Secondary | ICD-10-CM | POA: Diagnosis not present

## 2020-01-16 ENCOUNTER — Other Ambulatory Visit
Admission: RE | Admit: 2020-01-16 | Discharge: 2020-01-16 | Disposition: A | Discharge status: Home / Self Care | Payer: Medicare PPO | Attending: Hematology & Oncology | Admitting: Hematology & Oncology

## 2020-01-16 DIAGNOSIS — D46C Myelodysplastic syndrome with isolated del(5q) chromosomal abnormality: Secondary | ICD-10-CM | POA: Insufficient documentation

## 2020-01-16 LAB — CBC WITH DIFFERENTIAL/PLATELET
Abs Immature Granulocytes: 0.01 10*3/uL (ref 0.00–0.07)
Basophils Absolute: 0.1 10*3/uL (ref 0.0–0.1)
Basophils Relative: 2 %
Eosinophils Absolute: 0.1 10*3/uL (ref 0.0–0.5)
Eosinophils Relative: 3 %
HCT: 32.3 % — ABNORMAL LOW (ref 36.0–46.0)
Hemoglobin: 10.6 g/dL — ABNORMAL LOW (ref 12.0–15.0)
Immature Granulocytes: 0 %
Lymphocytes Relative: 17 %
Lymphs Abs: 0.5 10*3/uL — ABNORMAL LOW (ref 0.7–4.0)
MCH: 29.1 pg (ref 26.0–34.0)
MCHC: 32.8 g/dL (ref 30.0–36.0)
MCV: 88.7 fL (ref 80.0–100.0)
Monocytes Absolute: 0.5 10*3/uL (ref 0.1–1.0)
Monocytes Relative: 18 %
Neutro Abs: 1.7 10*3/uL (ref 1.7–7.7)
Neutrophils Relative %: 60 %
Platelets: 118 10*3/uL — ABNORMAL LOW (ref 150–400)
RBC: 3.64 MIL/uL — ABNORMAL LOW (ref 3.87–5.11)
RDW: 16.3 % — ABNORMAL HIGH (ref 11.5–15.5)
WBC: 2.9 10*3/uL — ABNORMAL LOW (ref 4.0–10.5)
nRBC: 0 % (ref 0.0–0.2)

## 2020-01-16 LAB — COMPREHENSIVE METABOLIC PANEL
ALT: 14 U/L (ref 0–44)
AST: 13 U/L — ABNORMAL LOW (ref 15–41)
Albumin: 3.6 g/dL (ref 3.5–5.0)
Alkaline Phosphatase: 67 U/L (ref 38–126)
Anion gap: 7 (ref 5–15)
BUN: 15 mg/dL (ref 8–23)
CO2: 26 mmol/L (ref 22–32)
Calcium: 8.6 mg/dL — ABNORMAL LOW (ref 8.9–10.3)
Chloride: 104 mmol/L (ref 98–111)
Creatinine, Ser: 0.84 mg/dL (ref 0.44–1.00)
GFR calc Af Amer: >60 mL/min (ref >60)
GFR calc non Af Amer: >60 mL/min (ref >60)
Glucose, Bld: 119 mg/dL — ABNORMAL HIGH (ref 70–99)
Potassium: 3.8 mmol/L (ref 3.5–5.1)
Sodium: 137 mmol/L (ref 135–145)
Total Bilirubin: 0.4 mg/dL (ref 0.3–1.2)
Total Protein: 6.6 g/dL (ref 6.5–8.1)

## 2020-02-05 ENCOUNTER — Other Ambulatory Visit: Payer: Self-pay | Admitting: Internal Medicine

## 2020-02-11 DIAGNOSIS — H353211 Exudative age-related macular degeneration, right eye, with active choroidal neovascularization: Secondary | ICD-10-CM | POA: Diagnosis not present

## 2020-02-12 ENCOUNTER — Encounter: Payer: Self-pay | Admitting: Internal Medicine

## 2020-02-12 ENCOUNTER — Ambulatory Visit: Payer: Medicare PPO | Admitting: Internal Medicine

## 2020-02-12 ENCOUNTER — Other Ambulatory Visit: Payer: Self-pay

## 2020-02-12 DIAGNOSIS — E7800 Pure hypercholesterolemia, unspecified: Secondary | ICD-10-CM | POA: Diagnosis not present

## 2020-02-12 DIAGNOSIS — D469 Myelodysplastic syndrome, unspecified: Secondary | ICD-10-CM | POA: Diagnosis not present

## 2020-02-12 DIAGNOSIS — E041 Nontoxic single thyroid nodule: Secondary | ICD-10-CM

## 2020-02-12 DIAGNOSIS — I10 Essential (primary) hypertension: Secondary | ICD-10-CM | POA: Diagnosis not present

## 2020-02-12 DIAGNOSIS — E1165 Type 2 diabetes mellitus with hyperglycemia: Secondary | ICD-10-CM

## 2020-02-12 MED ORDER — NYSTATIN 100000 UNIT/GM EX CREA: 1.0000 "application " | TOPICAL_CREAM | Freq: Two times a day (BID) | CUTANEOUS | 1 refills | Status: DC | PRN

## 2020-02-12 NOTE — Progress Notes (Signed)
Patient ID: Kara Griffin, female   DOB: 16-Aug-1936, 84 y.o.   MRN: [id number]   Subjective:    Patient ID: Kara Griffin, female    DOB: 27-Jan-1936, 84 y.o.   MRN: [id number]  HPI This visit occurred during the SARS-CoV-2 public health emergency.  Safety protocols were in place, including screening questions prior to the visit, additional usage of staff PPE, and extensive cleaning of exam room while observing appropriate contact time as indicated for disinfecting solutions.  Patient here for a scheduled follow up.  She reports she is doing relatively well.  Sugars are doing better.  Reviewed outside sugars.  AM sugars averaging 130-140s and pm sugars averaging 140-160s.  Tries to stay active.  No chest pain or sob reported.  Feels breathing is stable.  No abdominal pain or bowel change reported.  No dizziness.  No headache.    Past Medical History:  Diagnosis Date  . Anemia   . Diabetes mellitus ([place])   . Environmental allergies   . Hypercholesterolemia   . Hypertension   . Myelodysplastic syndrome ([place])    Sees Dr Jerrye Noble   Past Surgical History:  Procedure Laterality Date  . ABDOMINAL HYSTERECTOMY    . [place]  . DILATION AND CURETTAGE OF UTERUS    . TONSILECTOMY, ADENOIDECTOMY, BILATERAL MYRINGOTOMY AND TUBES  1963  . TOTAL ABDOMINAL HYSTERECTOMY W/ BILATERAL SALPINGOOPHORECTOMY  1985   secondary to abnormal cells and abnormal uterine bleeding   Family History  Problem Relation Age of Onset  . Heart disease Father        Deceased (MI) - 53  . Diabetes Mother        Deceased  . Thyroid cancer Mother   . Osteoarthritis Mother   . Asthma Other   . Diabetes Brother   . Heart disease Maternal Grandmother        myocardial infarction - 3  . Asthma Maternal Grandmother   . Heart disease Paternal Grandmother        myocardial infarction-52  . Heart disease Brother   . Sinusitis Brother   . Depression Son   . Diabetes Son   . Depression  Son   . Diabetes Son   . Depression Son   . Breast cancer Neg Hx    Social History   Socioeconomic History  . Marital status: Divorced    Spouse name: Not on file  . Number of children: 3  . Years of education: Not on file  . Highest education level: Not on file  Occupational History  . Occupation: retired Games developer  Tobacco Use  . Smoking status: Never Smoker  . Smokeless tobacco: Never Used  Vaping Use  . Vaping Use: Never used  Substance and Sexual Activity  . Alcohol use: No    Alcohol/week: 0.0 standard drinks  . Drug use: No  . Sexual activity: Never  Other Topics Concern  . Not on file  Social History Narrative  . Not on file   Social Determinants of Health   Financial Resource Strain:   . Difficulty of Paying Living Expenses:   Food Insecurity:   . Worried About Charity fundraiser in the Last Year:   . Arboriculturist in the Last Year:   Transportation Needs:   . Film/video editor (Medical):   Marland Kitchen Lack of Transportation (Non-Medical):   Physical Activity:   . Days of Exercise per Week:   . Minutes of Exercise per  Session:   Stress:   . Feeling of Stress :   Social Connections:   . Frequency of Communication with Friends and Family:   . Frequency of Social Gatherings with Friends and Family:   . Attends Religious Services:   . Active Member of Clubs or Organizations:   . Attends Archivist Meetings:   Marland Kitchen Marital Status:     Outpatient Encounter Medications as of 02/12/2020  Medication Sig  . ACCU-CHEK GUIDE test strip USE TO CHECK BLOOD SUGARS TWICE DAILY  . Accu-Chek Softclix Lancets lancets CHECK BLOOD SUGAR TWICE DAILY  . aspirin 81 MG tablet Take 81 mg by mouth daily.  Marland Kitchen azelastine (ASTELIN) 0.1 % nasal spray PLACE 1 SPRAY INTO BOTH NOSTRILS TWICE DAILY  . bevacizumab (AVASTIN) 400 MG/16ML SOLN by Intravitreal route.  . blood glucose meter kit and supplies KIT Dispense based on patient and insurance preference. Use to check sugars  twice daily. DX E11.9  . Calcium Carbonate-Vitamin D (CALCIUM 600+D) 600-400 MG-UNIT per tablet Take 1 tablet by mouth daily. Reported on 01/15/2016  . cyanocobalamin 500 MCG tablet Take 1,000 mcg by mouth daily.   . fluticasone (FLONASE) 50 MCG/ACT nasal spray Place 2 sprays into both nostrils daily.  Marland Kitchen lenalidomide (REVLIMID) 10 MG capsule Take 10 mg by mouth daily.  Marland Kitchen losartan (COZAAR) 25 MG tablet TAKE 1 TABLET(25 MG) BY MOUTH TWICE DAILY  . metFORMIN (GLUCOPHAGE) 1000 MG tablet Take 1 tablet (1,000 mg total) by mouth 2 (two) times daily.  . Multiple Vitamin (MULTIVITAMIN) tablet Take 1 tablet by mouth daily.  . Multiple Vitamins-Minerals (PRESERVISION AREDS 2+MULTI VIT) CAPS Take 1 capsule by mouth 2 (two) times daily.  Marland Kitchen nystatin cream (MYCOSTATIN) Apply 1 application topically 2 (two) times daily as needed.  . rosuvastatin (CRESTOR) 10 MG tablet TAKE 1 TABLET(10 MG) BY MOUTH DAILY  . sitaGLIPtin (JANUVIA) 100 MG tablet TAKE 1 TABLET BY MOUTH DAILY  . [DISCONTINUED] nystatin cream (MYCOSTATIN) Apply 1 application topically 2 (two) times daily. (Patient taking differently: Apply 1 application topically 2 (two) times daily as needed. )   No facility-administered encounter medications on file as of 02/12/2020.    Review of Systems  Constitutional: Negative for appetite change and unexpected weight change.  HENT: Negative for congestion and sinus pressure.   Respiratory: Negative for cough and chest tightness.        Breathing stable.   Cardiovascular: Negative for chest pain, palpitations and leg swelling.  Gastrointestinal: Negative for abdominal pain, diarrhea, nausea and vomiting.  Genitourinary: Negative for difficulty urinating and dysuria.  Musculoskeletal: Negative for joint swelling and myalgias.  Skin: Negative for color change and rash.  Neurological: Negative for dizziness, light-headedness and headaches.  Psychiatric/Behavioral: Negative for agitation and dysphoric mood.        Objective:    Physical Exam Vitals reviewed.  Constitutional:      General: She is not in acute distress.    Appearance: Normal appearance.  HENT:     Head: Normocephalic and atraumatic.     Right Ear: External ear normal.     Left Ear: External ear normal.  Eyes:     General: No scleral icterus.       Right eye: No discharge.        Left eye: No discharge.     Conjunctiva/sclera: Conjunctivae normal.  Neck:     Thyroid: No thyromegaly.  Cardiovascular:     Rate and Rhythm: Normal rate and regular rhythm.  Pulmonary:  Effort: No respiratory distress.     Breath sounds: Normal breath sounds. No wheezing.  Abdominal:     General: Bowel sounds are normal.     Palpations: Abdomen is soft.     Tenderness: There is no abdominal tenderness.  Musculoskeletal:        General: No swelling or tenderness.     Cervical back: Neck supple. No tenderness.  Lymphadenopathy:     Cervical: No cervical adenopathy.  Skin:    Findings: No erythema or rash.  Neurological:     Mental Status: She is alert.  Psychiatric:        Mood and Affect: Mood normal.        Behavior: Behavior normal.     BP 118/62   Pulse 82   Temp (!) 97.5 F (36.4 C)   Resp 16   Ht 5' 1" (1.549 m)   Wt 176 lb (79.8 kg)   LMP 08/13/1984   SpO2 98%   BMI 33.25 kg/m  Wt Readings from Last 3 Encounters:  02/12/20 176 lb (79.8 kg)  05/31/19 178 lb 9.6 oz (81 kg)  07/16/18 178 lb 8 oz (81 kg)     Lab Results  Component Value Date   WBC 2.9 (L) 02/13/2020   HGB 10.8 (L) 02/13/2020   HCT 32.4 (L) 02/13/2020   PLT 115 (L) 02/13/2020   GLUCOSE 137 (H) 02/13/2020   CHOL 112 02/08/2019   TRIG 166 (H) 02/08/2019   HDL 48 02/08/2019   LDLDIRECT 85.3 08/23/2013   LDLCALC 31 02/08/2019   ALT 16 02/13/2020   AST 15 02/13/2020   NA 137 02/13/2020   K 4.0 02/13/2020   CL 102 02/13/2020   CREATININE 0.85 02/13/2020   BUN 17 02/13/2020   CO2 26 02/13/2020   TSH 1.586 02/20/2017   INR 0.9  05/08/2019   HGBA1C 6.3 (H) 02/08/2019   MICROALBUR 1.0 01/16/2015       Assessment & Plan:   Problem List Items Addressed This Visit    Diabetes mellitus ([place])    Sugars better.  Low carb diet and exercise.  Follow met b and a1c. Continue metformin and januvia.        Hypercholesterolemia    Low cholesterol diet and exercise.  On crestor.  Follow lipid panel and liver function tests.       Hypertension    Blood pressure under good control.  Continue losartan.  Follow pressures.  Follow metabolic panel.       Myelodysplastic syndrome ([place])    Followed by hematology - Dr Florene Glen.  Stable.        Thyroid nodule    Worked up by Dr Harlow Asa.  Follow tsh.           Einar Pheasant, MD

## 2020-02-13 ENCOUNTER — Other Ambulatory Visit
Admission: RE | Admit: 2020-02-13 | Discharge: 2020-02-13 | Disposition: A | Discharge status: Home / Self Care | Payer: Medicare PPO | Attending: Hematology & Oncology | Admitting: Hematology & Oncology

## 2020-02-13 DIAGNOSIS — D46C Myelodysplastic syndrome with isolated del(5q) chromosomal abnormality: Secondary | ICD-10-CM | POA: Insufficient documentation

## 2020-02-13 LAB — COMPREHENSIVE METABOLIC PANEL
ALT: 16 U/L (ref 0–44)
AST: 15 U/L (ref 15–41)
Albumin: 3.7 g/dL (ref 3.5–5.0)
Alkaline Phosphatase: 70 U/L (ref 38–126)
Anion gap: 9 (ref 5–15)
BUN: 17 mg/dL (ref 8–23)
CO2: 26 mmol/L (ref 22–32)
Calcium: 9 mg/dL (ref 8.9–10.3)
Chloride: 102 mmol/L (ref 98–111)
Creatinine, Ser: 0.85 mg/dL (ref 0.44–1.00)
GFR calc Af Amer: >60 mL/min (ref >60)
GFR calc non Af Amer: >60 mL/min (ref >60)
Glucose, Bld: 137 mg/dL — ABNORMAL HIGH (ref 70–99)
Potassium: 4 mmol/L (ref 3.5–5.1)
Sodium: 137 mmol/L (ref 135–145)
Total Bilirubin: 0.3 mg/dL (ref 0.3–1.2)
Total Protein: 6.9 g/dL (ref 6.5–8.1)

## 2020-02-13 LAB — CBC WITH DIFFERENTIAL/PLATELET
Abs Immature Granulocytes: 0.02 10*3/uL (ref 0.00–0.07)
Basophils Absolute: 0.1 10*3/uL (ref 0.0–0.1)
Basophils Relative: 2 %
Eosinophils Absolute: 0.1 10*3/uL (ref 0.0–0.5)
Eosinophils Relative: 3 %
HCT: 32.4 % — ABNORMAL LOW (ref 36.0–46.0)
Hemoglobin: 10.8 g/dL — ABNORMAL LOW (ref 12.0–15.0)
Immature Granulocytes: 1 %
Lymphocytes Relative: 19 %
Lymphs Abs: 0.5 10*3/uL — ABNORMAL LOW (ref 0.7–4.0)
MCH: 29.8 pg (ref 26.0–34.0)
MCHC: 33.3 g/dL (ref 30.0–36.0)
MCV: 89.3 fL (ref 80.0–100.0)
Monocytes Absolute: 0.5 10*3/uL (ref 0.1–1.0)
Monocytes Relative: 18 %
Neutro Abs: 1.7 10*3/uL (ref 1.7–7.7)
Neutrophils Relative %: 57 %
Platelets: 115 10*3/uL — ABNORMAL LOW (ref 150–400)
RBC: 3.63 MIL/uL — ABNORMAL LOW (ref 3.87–5.11)
RDW: 16.3 % — ABNORMAL HIGH (ref 11.5–15.5)
WBC: 2.9 10*3/uL — ABNORMAL LOW (ref 4.0–10.5)
nRBC: 0 % (ref 0.0–0.2)

## 2020-02-19 ENCOUNTER — Encounter: Payer: Self-pay | Admitting: Internal Medicine

## 2020-02-19 NOTE — Assessment & Plan Note (Signed)
Worked up by Dr Harlow Asa.  Follow tsh.

## 2020-02-19 NOTE — Assessment & Plan Note (Signed)
Low cholesterol diet and exercise.  On crestor.  Follow lipid panel and liver function tests.  

## 2020-02-19 NOTE — Assessment & Plan Note (Signed)
Blood pressure under good control.  Continue losartan.  Follow pressures.  Follow metabolic panel.

## 2020-02-19 NOTE — Assessment & Plan Note (Signed)
Followed by hematology - Dr Florene Glen.  Stable.

## 2020-02-19 NOTE — Assessment & Plan Note (Addendum)
Sugars better.  Low carb diet and exercise.  Follow met b and a1c. Continue metformin and januvia.   

## 2020-02-21 ENCOUNTER — Other Ambulatory Visit: Payer: Self-pay | Admitting: Internal Medicine

## 2020-03-10 DIAGNOSIS — I10 Essential (primary) hypertension: Secondary | ICD-10-CM | POA: Diagnosis not present

## 2020-03-10 DIAGNOSIS — Z7984 Long term (current) use of oral hypoglycemic drugs: Secondary | ICD-10-CM | POA: Diagnosis not present

## 2020-03-10 DIAGNOSIS — Z7982 Long term (current) use of aspirin: Secondary | ICD-10-CM | POA: Diagnosis not present

## 2020-03-10 DIAGNOSIS — E119 Type 2 diabetes mellitus without complications: Secondary | ICD-10-CM | POA: Diagnosis not present

## 2020-03-10 DIAGNOSIS — D46C Myelodysplastic syndrome with isolated del(5q) chromosomal abnormality: Secondary | ICD-10-CM | POA: Diagnosis not present

## 2020-03-10 DIAGNOSIS — E785 Hyperlipidemia, unspecified: Secondary | ICD-10-CM | POA: Diagnosis not present

## 2020-03-10 DIAGNOSIS — Z79899 Other long term (current) drug therapy: Secondary | ICD-10-CM | POA: Diagnosis not present

## 2020-03-10 DIAGNOSIS — H3530 Unspecified macular degeneration: Secondary | ICD-10-CM | POA: Diagnosis not present

## 2020-03-23 DIAGNOSIS — H353211 Exudative age-related macular degeneration, right eye, with active choroidal neovascularization: Secondary | ICD-10-CM | POA: Diagnosis not present

## 2020-04-03 ENCOUNTER — Other Ambulatory Visit: Payer: Self-pay | Admitting: Internal Medicine

## 2020-04-07 ENCOUNTER — Other Ambulatory Visit
Admission: RE | Admit: 2020-04-07 | Discharge: 2020-04-07 | Disposition: A | Discharge status: Home / Self Care | Payer: Medicare PPO | Attending: Hematology & Oncology | Admitting: Hematology & Oncology

## 2020-04-07 ENCOUNTER — Other Ambulatory Visit: Payer: Self-pay

## 2020-04-07 DIAGNOSIS — D46C Myelodysplastic syndrome with isolated del(5q) chromosomal abnormality: Secondary | ICD-10-CM | POA: Diagnosis not present

## 2020-04-07 LAB — CBC WITH DIFFERENTIAL/PLATELET
Abs Immature Granulocytes: 0.04 10*3/uL (ref 0.00–0.07)
Basophils Absolute: 0.1 10*3/uL (ref 0.0–0.1)
Basophils Relative: 2 %
Eosinophils Absolute: 0.1 10*3/uL (ref 0.0–0.5)
Eosinophils Relative: 3 %
HCT: 33.4 % — ABNORMAL LOW (ref 36.0–46.0)
Hemoglobin: 11 g/dL — ABNORMAL LOW (ref 12.0–15.0)
Immature Granulocytes: 1 %
Lymphocytes Relative: 16 %
Lymphs Abs: 0.6 10*3/uL — ABNORMAL LOW (ref 0.7–4.0)
MCH: 29.5 pg (ref 26.0–34.0)
MCHC: 32.9 g/dL (ref 30.0–36.0)
MCV: 89.5 fL (ref 80.0–100.0)
Monocytes Absolute: 0.7 10*3/uL (ref 0.1–1.0)
Monocytes Relative: 19 %
Neutro Abs: 2.2 10*3/uL (ref 1.7–7.7)
Neutrophils Relative %: 59 %
Platelets: 155 10*3/uL (ref 150–400)
RBC: 3.73 MIL/uL — ABNORMAL LOW (ref 3.87–5.11)
RDW: 16.2 % — ABNORMAL HIGH (ref 11.5–15.5)
WBC: 3.8 10*3/uL — ABNORMAL LOW (ref 4.0–10.5)
nRBC: 0 % (ref 0.0–0.2)

## 2020-04-07 LAB — COMPREHENSIVE METABOLIC PANEL
ALT: 17 U/L (ref 0–44)
AST: 16 U/L (ref 15–41)
Albumin: 3.7 g/dL (ref 3.5–5.0)
Alkaline Phosphatase: 80 U/L (ref 38–126)
Anion gap: 9 (ref 5–15)
BUN: 18 mg/dL (ref 8–23)
CO2: 27 mmol/L (ref 22–32)
Calcium: 8.8 mg/dL — ABNORMAL LOW (ref 8.9–10.3)
Chloride: 101 mmol/L (ref 98–111)
Creatinine, Ser: 0.87 mg/dL (ref 0.44–1.00)
GFR calc Af Amer: >60 mL/min (ref >60)
GFR calc non Af Amer: >60 mL/min (ref >60)
Glucose, Bld: 166 mg/dL — ABNORMAL HIGH (ref 70–99)
Potassium: 4.5 mmol/L (ref 3.5–5.1)
Sodium: 137 mmol/L (ref 135–145)
Total Bilirubin: 0.6 mg/dL (ref 0.3–1.2)
Total Protein: 6.9 g/dL (ref 6.5–8.1)

## 2020-04-21 DIAGNOSIS — H353211 Exudative age-related macular degeneration, right eye, with active choroidal neovascularization: Secondary | ICD-10-CM | POA: Diagnosis not present

## 2020-05-07 ENCOUNTER — Other Ambulatory Visit: Payer: Self-pay

## 2020-05-07 ENCOUNTER — Other Ambulatory Visit
Admission: RE | Admit: 2020-05-07 | Discharge: 2020-05-07 | Disposition: A | Discharge status: Home / Self Care | Payer: Medicare PPO | Attending: Hematology & Oncology | Admitting: Hematology & Oncology

## 2020-05-07 DIAGNOSIS — D469 Myelodysplastic syndrome, unspecified: Secondary | ICD-10-CM | POA: Insufficient documentation

## 2020-05-07 LAB — CBC WITH DIFFERENTIAL/PLATELET
Abs Immature Granulocytes: 0.03 10*3/uL (ref 0.00–0.07)
Basophils Absolute: 0.1 10*3/uL (ref 0.0–0.1)
Basophils Relative: 3 %
Eosinophils Absolute: 0.1 10*3/uL (ref 0.0–0.5)
Eosinophils Relative: 4 %
HCT: 32.1 % — ABNORMAL LOW (ref 36.0–46.0)
Hemoglobin: 10.5 g/dL — ABNORMAL LOW (ref 12.0–15.0)
Immature Granulocytes: 1 %
Lymphocytes Relative: 16 %
Lymphs Abs: 0.4 10*3/uL — ABNORMAL LOW (ref 0.7–4.0)
MCH: 29.7 pg (ref 26.0–34.0)
MCHC: 32.7 g/dL (ref 30.0–36.0)
MCV: 90.7 fL (ref 80.0–100.0)
Monocytes Absolute: 0.5 10*3/uL (ref 0.1–1.0)
Monocytes Relative: 19 %
Neutro Abs: 1.6 10*3/uL — ABNORMAL LOW (ref 1.7–7.7)
Neutrophils Relative %: 57 %
Platelets: 125 10*3/uL — ABNORMAL LOW (ref 150–400)
RBC: 3.54 MIL/uL — ABNORMAL LOW (ref 3.87–5.11)
RDW: 16.4 % — ABNORMAL HIGH (ref 11.5–15.5)
WBC: 2.7 10*3/uL — ABNORMAL LOW (ref 4.0–10.5)
nRBC: 0 % (ref 0.0–0.2)

## 2020-05-18 ENCOUNTER — Other Ambulatory Visit: Payer: Self-pay | Admitting: Internal Medicine

## 2020-05-21 ENCOUNTER — Other Ambulatory Visit: Payer: Self-pay | Admitting: Internal Medicine

## 2020-05-26 DIAGNOSIS — H353211 Exudative age-related macular degeneration, right eye, with active choroidal neovascularization: Secondary | ICD-10-CM | POA: Diagnosis not present

## 2020-06-02 DIAGNOSIS — Z7982 Long term (current) use of aspirin: Secondary | ICD-10-CM | POA: Diagnosis not present

## 2020-06-02 DIAGNOSIS — E785 Hyperlipidemia, unspecified: Secondary | ICD-10-CM | POA: Diagnosis not present

## 2020-06-02 DIAGNOSIS — I10 Essential (primary) hypertension: Secondary | ICD-10-CM | POA: Diagnosis not present

## 2020-06-02 DIAGNOSIS — H3530 Unspecified macular degeneration: Secondary | ICD-10-CM | POA: Diagnosis not present

## 2020-06-02 DIAGNOSIS — E119 Type 2 diabetes mellitus without complications: Secondary | ICD-10-CM | POA: Diagnosis not present

## 2020-06-02 DIAGNOSIS — Z9181 History of falling: Secondary | ICD-10-CM | POA: Diagnosis not present

## 2020-06-02 DIAGNOSIS — Z9221 Personal history of antineoplastic chemotherapy: Secondary | ICD-10-CM | POA: Diagnosis not present

## 2020-06-02 DIAGNOSIS — D46C Myelodysplastic syndrome with isolated del(5q) chromosomal abnormality: Secondary | ICD-10-CM | POA: Diagnosis not present

## 2020-06-02 DIAGNOSIS — Z7984 Long term (current) use of oral hypoglycemic drugs: Secondary | ICD-10-CM | POA: Diagnosis not present

## 2020-06-17 ENCOUNTER — Ambulatory Visit (INDEPENDENT_AMBULATORY_CARE_PROVIDER_SITE_OTHER): Payer: Medicare PPO | Admitting: Internal Medicine

## 2020-06-17 ENCOUNTER — Encounter: Payer: Self-pay | Admitting: Internal Medicine

## 2020-06-17 ENCOUNTER — Other Ambulatory Visit: Payer: Self-pay

## 2020-06-17 VITALS — BP 120/62 | HR 94 | Temp 97.8°F | Resp 16 | Ht 61.0 in | Wt 168.0 lb

## 2020-06-17 DIAGNOSIS — E7800 Pure hypercholesterolemia, unspecified: Secondary | ICD-10-CM | POA: Diagnosis not present

## 2020-06-17 DIAGNOSIS — I10 Essential (primary) hypertension: Secondary | ICD-10-CM | POA: Diagnosis not present

## 2020-06-17 DIAGNOSIS — Z0000 Encounter for general adult medical examination without abnormal findings: Secondary | ICD-10-CM

## 2020-06-17 DIAGNOSIS — L989 Disorder of the skin and subcutaneous tissue, unspecified: Secondary | ICD-10-CM

## 2020-06-17 DIAGNOSIS — E1165 Type 2 diabetes mellitus with hyperglycemia: Secondary | ICD-10-CM

## 2020-06-17 DIAGNOSIS — Z23 Encounter for immunization: Secondary | ICD-10-CM | POA: Diagnosis not present

## 2020-06-17 DIAGNOSIS — Z9109 Other allergy status, other than to drugs and biological substances: Secondary | ICD-10-CM | POA: Diagnosis not present

## 2020-06-17 DIAGNOSIS — D469 Myelodysplastic syndrome, unspecified: Secondary | ICD-10-CM | POA: Diagnosis not present

## 2020-06-17 LAB — MICROALBUMIN / CREATININE URINE RATIO
Creatinine,U: 73.8 mg/dL
Microalb Creat Ratio: 1.6 mg/g (ref 0.0–30.0)
Microalb, Ur: 1.2 mg/dL (ref 0.0–1.9)

## 2020-06-17 LAB — HEMOGLOBIN A1C: Hgb A1c MFr Bld: 6.7 % — ABNORMAL HIGH (ref 4.6–6.5)

## 2020-06-17 LAB — LDL CHOLESTEROL, DIRECT: Direct LDL: 53 mg/dL

## 2020-06-17 LAB — LIPID PANEL
Cholesterol: 121 mg/dL (ref 0–200)
HDL: 44.7 mg/dL (ref >39.00)
NonHDL: 76.39
Total CHOL/HDL Ratio: 3
Triglycerides: 216 mg/dL — ABNORMAL HIGH (ref 0.0–149.0)
VLDL: 43.2 mg/dL — ABNORMAL HIGH (ref 0.0–40.0)

## 2020-06-17 LAB — TSH: TSH: 2.07 u[IU]/mL (ref 0.35–4.50)

## 2020-06-17 MED ORDER — MUPIROCIN 2 % EX OINT: TOPICAL_OINTMENT | CUTANEOUS | 0 refills | Status: DC

## 2020-06-17 NOTE — Assessment & Plan Note (Addendum)
Physical today 06/17/20.  Mammogram overdue.   cologuard 08/2016 - negative.

## 2020-06-17 NOTE — Progress Notes (Signed)
Patient ID: Kara Griffin, female   DOB: [birth year], 84 y.o.   MRN: [id number]   Subjective:    Patient ID: Kara Griffin, female    DOB: [birth year], 84 y.o.   MRN: [id number]  HPI This visit occurred during the SARS-CoV-2 public health emergency.  Safety protocols were in place, including screening questions prior to the visit, additional usage of staff PPE, and extensive cleaning of exam room while observing appropriate contact time as indicated for disinfecting solutions.  Patient here for her physical exam.  She reports she is doing relatively well.  Here to follow up regarding her sugar and her blood pressure.  Reviewed outside sugar readings.  Sugars in am averaging 130-150 and pm sugars averaging 140-170.  No chest pain or sob.  No increased cough or congestion.  No abdominal pain.  Bowels stable.  Does report a persistent lesion - buttock.  Initially bigger hard area.  Scab has come off a couple of times.  Persistent area.  No fever.  No other rash.    Past Medical History:  Diagnosis Date  . Anemia   . Diabetes mellitus ([place])   . Environmental allergies   . Hypercholesterolemia   . Hypertension   . Myelodysplastic syndrome ([place])    Sees Dr Jerrye Noble   Past Surgical History:  Procedure Laterality Date  . ABDOMINAL HYSTERECTOMY    . [place]  . DILATION AND CURETTAGE OF UTERUS    . TONSILECTOMY, ADENOIDECTOMY, BILATERAL MYRINGOTOMY AND TUBES  1963  . TOTAL ABDOMINAL HYSTERECTOMY W/ BILATERAL SALPINGOOPHORECTOMY  1985   secondary to abnormal cells and abnormal uterine bleeding   Family History  Problem Relation Age of Onset  . Heart disease Father        Deceased (MI) - 53  . Diabetes Mother        Deceased  . Thyroid cancer Mother   . Osteoarthritis Mother   . Asthma Other   . Diabetes Brother   . Heart disease Maternal Grandmother        myocardial infarction - 24  . Asthma Maternal Grandmother   . Heart disease Paternal Grandmother         myocardial infarction-52  . Heart disease Brother   . Sinusitis Brother   . Depression Son   . Diabetes Son   . Depression Son   . Diabetes Son   . Depression Son   . Breast cancer Neg Hx    Social History   Socioeconomic History  . Marital status: Divorced    Spouse name: Not on file  . Number of children: 3  . Years of education: Not on file  . Highest education level: Not on file  Occupational History  . Occupation: retired Games developer  Tobacco Use  . Smoking status: Never Smoker  . Smokeless tobacco: Never Used  Vaping Use  . Vaping Use: Never used  Substance and Sexual Activity  . Alcohol use: No    Alcohol/week: 0.0 standard drinks  . Drug use: No  . Sexual activity: Never  Other Topics Concern  . Not on file  Social History Narrative  . Not on file   Social Determinants of Health   Financial Resource Strain:   . Difficulty of Paying Living Expenses: Not on file  Food Insecurity:   . Worried About Charity fundraiser in the Last Year: Not on file  . Ran Out of Food in the Last Year: Not on file  Transportation  Needs:   . Lack of Transportation (Medical): Not on file  . Lack of Transportation (Non-Medical): Not on file  Physical Activity:   . Days of Exercise per Week: Not on file  . Minutes of Exercise per Session: Not on file  Stress:   . Feeling of Stress : Not on file  Social Connections:   . Frequency of Communication with Friends and Family: Not on file  . Frequency of Social Gatherings with Friends and Family: Not on file  . Attends Religious Services: Not on file  . Active Member of Clubs or Organizations: Not on file  . Attends Archivist Meetings: Not on file  . Marital Status: Not on file    Outpatient Encounter Medications as of 06/17/2020  Medication Sig  . ACCU-CHEK GUIDE test strip USE TO CHECK BLOOD SUGARS TWICE DAILY  . Accu-Chek Softclix Lancets lancets CHECK BLOOD SUGAR TWICE DAILY  . aspirin 81 MG tablet Take 81 mg  by mouth daily.  Marland Kitchen azelastine (ASTELIN) 0.1 % nasal spray PLACE 1 SPRAY INTO BOTH NOSTRILS TWICE DAILY  . blood glucose meter kit and supplies KIT Dispense based on patient and insurance preference. Use to check sugars twice daily. DX E11.9  . Calcium Carbonate-Vitamin D (CALCIUM 600+D) 600-400 MG-UNIT per tablet Take 1 tablet by mouth daily. Reported on 01/15/2016  . cyanocobalamin 500 MCG tablet Take 1,000 mcg by mouth daily.   . fluticasone (FLONASE) 50 MCG/ACT nasal spray Place 2 sprays into both nostrils daily.  Marland Kitchen lenalidomide (REVLIMID) 10 MG capsule Take 10 mg by mouth daily.  Marland Kitchen losartan (COZAAR) 25 MG tablet TAKE 1 TABLET(25 MG) BY MOUTH TWICE DAILY  . metFORMIN (GLUCOPHAGE) 1000 MG tablet TAKE 1 TABLET(1000 MG) BY MOUTH TWICE DAILY  . Multiple Vitamin (MULTIVITAMIN) tablet Take 1 tablet by mouth daily.  . Multiple Vitamins-Minerals (PRESERVISION AREDS 2+MULTI VIT) CAPS Take 1 capsule by mouth 2 (two) times daily.  . mupirocin ointment (BACTROBAN) 2 % Apply to affected area bid  . nystatin cream (MYCOSTATIN) Apply 1 application topically 2 (two) times daily as needed.  . rosuvastatin (CRESTOR) 10 MG tablet TAKE 1 TABLET(10 MG) BY MOUTH DAILY  . sitaGLIPtin (JANUVIA) 100 MG tablet TAKE 1 TABLET BY MOUTH DAILY  . [DISCONTINUED] bevacizumab (AVASTIN) 400 MG/16ML SOLN by Intravitreal route.   No facility-administered encounter medications on file as of 06/17/2020.    Review of Systems  Constitutional: Negative for appetite change and unexpected weight change.  HENT: Negative for congestion, sinus pressure and sore throat.   Eyes: Negative for pain and visual disturbance.  Respiratory: Negative for cough, chest tightness and shortness of breath.   Cardiovascular: Negative for chest pain and palpitations.       No increased swelling.   Gastrointestinal: Negative for abdominal pain, diarrhea, nausea and vomiting.  Genitourinary: Negative for difficulty urinating and dysuria.   Musculoskeletal: Negative for back pain and joint swelling.  Skin: Negative for color change and rash.  Neurological: Negative for dizziness, light-headedness and headaches.  Hematological: Negative for adenopathy. Does not bruise/bleed easily.  Psychiatric/Behavioral: Negative for agitation and dysphoric mood.       Objective:    Physical Exam Vitals reviewed.  Constitutional:      General: She is not in acute distress.    Appearance: Normal appearance. She is well-developed.  HENT:     Head: Normocephalic and atraumatic.     Right Ear: External ear normal.     Left Ear: External ear normal.  Eyes:  General: No scleral icterus.       Right eye: No discharge.        Left eye: No discharge.     Conjunctiva/sclera: Conjunctivae normal.  Neck:     Thyroid: No thyromegaly.  Cardiovascular:     Rate and Rhythm: Normal rate and regular rhythm.  Pulmonary:     Effort: No tachypnea, accessory muscle usage or respiratory distress.     Breath sounds: Normal breath sounds. No decreased breath sounds or wheezing.  Chest:     Breasts:        Right: No inverted nipple, mass, nipple discharge or tenderness (no axillary adenopathy).        Left: No inverted nipple, mass, nipple discharge or tenderness (no axilarry adenopathy).  Abdominal:     General: Bowel sounds are normal.     Palpations: Abdomen is soft.     Tenderness: There is no abdominal tenderness.  Musculoskeletal:        General: No tenderness.     Cervical back: Neck supple. No tenderness.  Lymphadenopathy:     Cervical: No cervical adenopathy.  Skin:    Findings: No erythema or rash.     Comments: Lesion - buttock.  No surrounding erythema - open scabbed area right  Neurological:     Mental Status: She is alert and oriented to person, place, and time.  Psychiatric:        Mood and Affect: Mood normal.        Behavior: Behavior normal.     BP 120/62   Pulse 94   Temp 97.8 F (36.6 C) (Oral)   Resp 16   Ht  _0  (1.549 m)   Wt 168 lb (76.2 kg)   LMP 08/13/1984   SpO2 98%   BMI 31.74 kg/m  Wt Readings from Last 3 Encounters:  06/17/20 168 lb (76.2 kg)  02/12/20 176 lb (79.8 kg)  05/31/19 178 lb 9.6 oz (81 kg)     Lab Results  Component Value Date   WBC 2.7 (L) 05/07/2020   HGB 10.5 (L) 05/07/2020   HCT 32.1 (L) 05/07/2020   PLT 125 (L) 05/07/2020   GLUCOSE 166 (H) 04/07/2020   CHOL 121 06/17/2020   TRIG 216.0 (H) 06/17/2020   HDL 44.70 06/17/2020   LDLDIRECT 53.0 06/17/2020   LDLCALC 31 02/08/2019   ALT 17 04/07/2020   AST 16 04/07/2020   NA 137 04/07/2020   K 4.5 04/07/2020   CL 101 04/07/2020   CREATININE 0.87 04/07/2020   BUN 18 04/07/2020   CO2 27 04/07/2020   TSH 2.07 06/17/2020   INR 0.9 05/08/2019   HGBA1C 6.7 (H) 06/17/2020   MICROALBUR 1.2 06/17/2020      Assessment & Plan:   Problem List Items Addressed This Visit    Skin lesion    Skin lesion of buttock.  Bactroban. Notify me if persistent.  May need dermatology evaluation.        Myelodysplastic syndrome ([place])    Followed by Dr Florene Glen - hematology.  Stable.       Hypertension    Blood pressure under good control.  On losartan. Follow pressures.  Follow metabolic panel.       Relevant Orders   TSH (Completed)   Hypercholesterolemia    On crestor.  Low cholesterol diet and exercise.  Follow lipid panel and liver function tests.        Relevant Orders   Lipid panel (Completed)   Health care  maintenance    Physical today 06/17/20.  Mammogram overdue.   cologuard 08/2016 - negative.       Environmental allergies    Controlled.        Diabetes mellitus ([place])    Low carb diet and exercise.  Follow met b and a1c. On metformin and januvia.        Relevant Orders   Hemoglobin A1c (Completed)   Microalbumin / creatinine urine ratio (Completed)    Other Visit Diagnoses    Routine general medical examination at a health care facility    -  Primary   Need for immunization against influenza        Relevant Orders   Flu Vaccine QUAD High Dose(Fluad) (Completed)       Einar Pheasant, MD

## 2020-06-21 ENCOUNTER — Encounter: Payer: Self-pay | Admitting: Internal Medicine

## 2020-06-21 DIAGNOSIS — L989 Disorder of the skin and subcutaneous tissue, unspecified: Secondary | ICD-10-CM | POA: Insufficient documentation

## 2020-06-21 NOTE — Assessment & Plan Note (Signed)
On crestor.  Low cholesterol diet and exercise.  Follow lipid panel and liver function tests.   

## 2020-06-21 NOTE — Assessment & Plan Note (Signed)
Low carb diet and exercise.  Follow met b and a1c. On metformin and januvia.

## 2020-06-21 NOTE — Assessment & Plan Note (Signed)
Skin lesion of buttock.  Bactroban. Notify me if persistent.  May need dermatology evaluation.

## 2020-06-21 NOTE — Assessment & Plan Note (Signed)
Controlled.  

## 2020-06-21 NOTE — Assessment & Plan Note (Signed)
Blood pressure under good control.  On losartan. Follow pressures.  Follow metabolic panel.

## 2020-06-21 NOTE — Assessment & Plan Note (Signed)
Followed by Dr Florene Glen - hematology.  Stable.

## 2020-06-30 ENCOUNTER — Other Ambulatory Visit: Payer: Self-pay | Admitting: Internal Medicine

## 2020-07-01 ENCOUNTER — Other Ambulatory Visit
Admission: RE | Admit: 2020-07-01 | Discharge: 2020-07-01 | Disposition: A | Discharge status: Home / Self Care | Payer: Medicare PPO | Attending: Hematology & Oncology | Admitting: Hematology & Oncology

## 2020-07-01 DIAGNOSIS — D46C Myelodysplastic syndrome with isolated del(5q) chromosomal abnormality: Secondary | ICD-10-CM | POA: Diagnosis not present

## 2020-07-01 LAB — COMPREHENSIVE METABOLIC PANEL
ALT: 14 U/L (ref 0–44)
AST: 12 U/L — ABNORMAL LOW (ref 15–41)
Albumin: 3.5 g/dL (ref 3.5–5.0)
Alkaline Phosphatase: 62 U/L (ref 38–126)
Anion gap: 9 (ref 5–15)
BUN: 16 mg/dL (ref 8–23)
CO2: 26 mmol/L (ref 22–32)
Calcium: 8.7 mg/dL — ABNORMAL LOW (ref 8.9–10.3)
Chloride: 104 mmol/L (ref 98–111)
Creatinine, Ser: 0.79 mg/dL (ref 0.44–1.00)
GFR, Estimated: >60 mL/min (ref >60)
Glucose, Bld: 133 mg/dL — ABNORMAL HIGH (ref 70–99)
Potassium: 3.7 mmol/L (ref 3.5–5.1)
Sodium: 139 mmol/L (ref 135–145)
Total Bilirubin: 0.3 mg/dL (ref 0.3–1.2)
Total Protein: 6.5 g/dL (ref 6.5–8.1)

## 2020-07-01 LAB — CBC WITH DIFFERENTIAL/PLATELET
Abs Immature Granulocytes: 0.01 10*3/uL (ref 0.00–0.07)
Basophils Absolute: 0.1 10*3/uL (ref 0.0–0.1)
Basophils Relative: 2 %
Eosinophils Absolute: 0.1 10*3/uL (ref 0.0–0.5)
Eosinophils Relative: 3 %
HCT: 31.4 % — ABNORMAL LOW (ref 36.0–46.0)
Hemoglobin: 10.2 g/dL — ABNORMAL LOW (ref 12.0–15.0)
Immature Granulocytes: 0 %
Lymphocytes Relative: 16 %
Lymphs Abs: 0.5 10*3/uL — ABNORMAL LOW (ref 0.7–4.0)
MCH: 29.1 pg (ref 26.0–34.0)
MCHC: 32.5 g/dL (ref 30.0–36.0)
MCV: 89.7 fL (ref 80.0–100.0)
Monocytes Absolute: 0.5 10*3/uL (ref 0.1–1.0)
Monocytes Relative: 17 %
Neutro Abs: 1.8 10*3/uL (ref 1.7–7.7)
Neutrophils Relative %: 62 %
Platelets: 136 10*3/uL — ABNORMAL LOW (ref 150–400)
RBC: 3.5 MIL/uL — ABNORMAL LOW (ref 3.87–5.11)
RDW: 16.4 % — ABNORMAL HIGH (ref 11.5–15.5)
WBC: 2.9 10*3/uL — ABNORMAL LOW (ref 4.0–10.5)
nRBC: 0 % (ref 0.0–0.2)

## 2020-07-06 DIAGNOSIS — H353211 Exudative age-related macular degeneration, right eye, with active choroidal neovascularization: Secondary | ICD-10-CM | POA: Diagnosis not present

## 2020-07-27 ENCOUNTER — Other Ambulatory Visit: Payer: Self-pay

## 2020-07-27 ENCOUNTER — Other Ambulatory Visit
Admission: RE | Admit: 2020-07-27 | Discharge: 2020-07-27 | Disposition: A | Discharge status: Home / Self Care | Payer: Medicare PPO | Attending: Hematology & Oncology | Admitting: Hematology & Oncology

## 2020-07-27 DIAGNOSIS — D46C Myelodysplastic syndrome with isolated del(5q) chromosomal abnormality: Secondary | ICD-10-CM | POA: Diagnosis not present

## 2020-07-27 LAB — CBC WITH DIFFERENTIAL/PLATELET
Abs Immature Granulocytes: 0.04 10*3/uL (ref 0.00–0.07)
Basophils Absolute: 0.1 10*3/uL (ref 0.0–0.1)
Basophils Relative: 2 %
Eosinophils Absolute: 0.1 10*3/uL (ref 0.0–0.5)
Eosinophils Relative: 3 %
HCT: 32.8 % — ABNORMAL LOW (ref 36.0–46.0)
Hemoglobin: 10.8 g/dL — ABNORMAL LOW (ref 12.0–15.0)
Immature Granulocytes: 1 %
Lymphocytes Relative: 15 %
Lymphs Abs: 0.5 10*3/uL — ABNORMAL LOW (ref 0.7–4.0)
MCH: 29.4 pg (ref 26.0–34.0)
MCHC: 32.9 g/dL (ref 30.0–36.0)
MCV: 89.4 fL (ref 80.0–100.0)
Monocytes Absolute: 0.6 10*3/uL (ref 0.1–1.0)
Monocytes Relative: 18 %
Neutro Abs: 2.1 10*3/uL (ref 1.7–7.7)
Neutrophils Relative %: 61 %
Platelets: 141 10*3/uL — ABNORMAL LOW (ref 150–400)
RBC: 3.67 MIL/uL — ABNORMAL LOW (ref 3.87–5.11)
RDW: 16.2 % — ABNORMAL HIGH (ref 11.5–15.5)
WBC: 3.4 10*3/uL — ABNORMAL LOW (ref 4.0–10.5)
nRBC: 0 % (ref 0.0–0.2)

## 2020-07-27 LAB — COMPREHENSIVE METABOLIC PANEL
ALT: 14 U/L (ref 0–44)
AST: 16 U/L (ref 15–41)
Albumin: 3.8 g/dL (ref 3.5–5.0)
Alkaline Phosphatase: 73 U/L (ref 38–126)
Anion gap: 10 (ref 5–15)
BUN: 18 mg/dL (ref 8–23)
CO2: 27 mmol/L (ref 22–32)
Calcium: 8.6 mg/dL — ABNORMAL LOW (ref 8.9–10.3)
Chloride: 98 mmol/L (ref 98–111)
Creatinine, Ser: 0.91 mg/dL (ref 0.44–1.00)
GFR, Estimated: >60 mL/min (ref >60)
Glucose, Bld: 149 mg/dL — ABNORMAL HIGH (ref 70–99)
Potassium: 4.1 mmol/L (ref 3.5–5.1)
Sodium: 135 mmol/L (ref 135–145)
Total Bilirubin: 0.3 mg/dL (ref 0.3–1.2)
Total Protein: 6.7 g/dL (ref 6.5–8.1)

## 2020-08-14 ENCOUNTER — Encounter: Payer: Self-pay | Admitting: Internal Medicine

## 2020-08-15 ENCOUNTER — Ambulatory Visit
Admission: RE | Admit: 2020-08-15 | Discharge: 2020-08-15 | Disposition: A | Discharge status: Home / Self Care | Payer: Medicare PPO | Source: Ambulatory Visit | Attending: Physician Assistant | Admitting: Physician Assistant

## 2020-08-15 ENCOUNTER — Other Ambulatory Visit: Payer: Self-pay

## 2020-08-15 VITALS — BP 119/67 | HR 76 | Temp 98.2°F | Resp 18 | Ht 61.0 in | Wt 162.0 lb

## 2020-08-15 DIAGNOSIS — R5383 Other fatigue: Secondary | ICD-10-CM | POA: Diagnosis not present

## 2020-08-15 DIAGNOSIS — D61818 Other pancytopenia: Secondary | ICD-10-CM

## 2020-08-15 DIAGNOSIS — R519 Headache, unspecified: Secondary | ICD-10-CM | POA: Diagnosis not present

## 2020-08-15 DIAGNOSIS — R197 Diarrhea, unspecified: Secondary | ICD-10-CM | POA: Diagnosis not present

## 2020-08-15 DIAGNOSIS — R410 Disorientation, unspecified: Secondary | ICD-10-CM | POA: Diagnosis not present

## 2020-08-15 DIAGNOSIS — R4182 Altered mental status, unspecified: Secondary | ICD-10-CM

## 2020-08-15 DIAGNOSIS — R6883 Chills (without fever): Secondary | ICD-10-CM

## 2020-08-15 DIAGNOSIS — E871 Hypo-osmolality and hyponatremia: Secondary | ICD-10-CM

## 2020-08-15 LAB — CBC WITH DIFFERENTIAL/PLATELET
Abs Immature Granulocytes: 0.03 10*3/uL (ref 0.00–0.07)
Basophils Absolute: 0 10*3/uL (ref 0.0–0.1)
Basophils Relative: 1 %
Eosinophils Absolute: 0.1 10*3/uL (ref 0.0–0.5)
Eosinophils Relative: 3 %
HCT: 32.2 % — ABNORMAL LOW (ref 36.0–46.0)
Hemoglobin: 10.6 g/dL — ABNORMAL LOW (ref 12.0–15.0)
Immature Granulocytes: 1 %
Lymphocytes Relative: 10 %
Lymphs Abs: 0.3 10*3/uL — ABNORMAL LOW (ref 0.7–4.0)
MCH: 29.4 pg (ref 26.0–34.0)
MCHC: 32.9 g/dL (ref 30.0–36.0)
MCV: 89.4 fL (ref 80.0–100.0)
Monocytes Absolute: 0.6 10*3/uL (ref 0.1–1.0)
Monocytes Relative: 20 %
Neutro Abs: 2.1 10*3/uL (ref 1.7–7.7)
Neutrophils Relative %: 65 %
Platelets: 145 10*3/uL — ABNORMAL LOW (ref 150–400)
RBC: 3.6 MIL/uL — ABNORMAL LOW (ref 3.87–5.11)
RDW: 16.2 % — ABNORMAL HIGH (ref 11.5–15.5)
WBC: 3.2 10*3/uL — ABNORMAL LOW (ref 4.0–10.5)
nRBC: 0 % (ref 0.0–0.2)

## 2020-08-15 LAB — URINALYSIS, COMPLETE (UACMP) WITH MICROSCOPIC
Bilirubin Urine: NEGATIVE
Glucose, UA: NEGATIVE mg/dL
Leukocytes,Ua: NEGATIVE
Nitrite: NEGATIVE
Protein, ur: NEGATIVE mg/dL
Specific Gravity, Urine: 1.02 (ref 1.005–1.030)
WBC, UA: NONE SEEN WBC/hpf (ref 0–5)
pH: 5 (ref 5.0–8.0)

## 2020-08-15 LAB — COMPREHENSIVE METABOLIC PANEL
ALT: 15 U/L (ref 0–44)
AST: 13 U/L — ABNORMAL LOW (ref 15–41)
Albumin: 3.9 g/dL (ref 3.5–5.0)
Alkaline Phosphatase: 54 U/L (ref 38–126)
Anion gap: 7 (ref 5–15)
BUN: 14 mg/dL (ref 8–23)
CO2: 26 mmol/L (ref 22–32)
Calcium: 8.9 mg/dL (ref 8.9–10.3)
Chloride: 97 mmol/L — ABNORMAL LOW (ref 98–111)
Creatinine, Ser: 0.87 mg/dL (ref 0.44–1.00)
GFR, Estimated: >60 mL/min (ref >60)
Glucose, Bld: 123 mg/dL — ABNORMAL HIGH (ref 70–99)
Potassium: 3.6 mmol/L (ref 3.5–5.1)
Sodium: 130 mmol/L — ABNORMAL LOW (ref 135–145)
Total Bilirubin: 0.6 mg/dL (ref 0.3–1.2)
Total Protein: 7.1 g/dL (ref 6.5–8.1)

## 2020-08-15 NOTE — ED Triage Notes (Signed)
Patient in today c/o intermittent headache, chills, diarrhea, fatigue and confusion x 2 weeks. Patient had a PCR covid test on 08/10/20 and it was negative. Patient denies any urinary symptoms. Patient has taken OTC Tylenol periodically.

## 2020-08-15 NOTE — ED Provider Notes (Signed)
[facility]    CSN: [id number] Arrival date & time: 08/15/20  1438      History   Chief Complaint Chief Complaint  Patient presents with  . Appointment  . Headache  . Chills  . Diarrhea  . Fatigue  . Altered Mental Status    HPI Kara Griffin is a 84 y.o. female who has been accompanied by her daughter-in-law today for concerns about increased fatigue over the past 2 weeks.  Patient has also been complaining of feeling a little bit confused at times.  She states that sometimes she will forget what she was just doing.  She has had chills and intermittent shaking of her upper extremities.  Patient has a chronic cough and says that it is not worse currently and has not really been productive.  Denies any associated breathing difficulty or wheezing.  She has a past medical history significant for diabetes, hypertension, hyperlipidemia, and myelodysplastic syndrome.  Patient denies current headaches, vision changes, dizziness, weakness, falls, syncope, seizures, sore throat, congestion.  Denies any chest pain, palpitations, increased leg swelling, or breathing difficulty.  Patient had a negative COVID-19 test a couple of days ago.  Denies any changes with her blood sugars.  Patient does not have any current diagnosis of dementia or memory issues.  Her daughter-in-law is concerned she may be having a urinary tract infection causing some of her symptoms.  Patient denies any dysuria, urinary frequency urgency.  Denies any hematuria.  They have no other concerns today.  HPI  Past Medical History:  Diagnosis Date  . Anemia   . Diabetes mellitus ([place])   . Environmental allergies   . Hypercholesterolemia   . Hypertension   . Myelodysplastic syndrome ([place])    Sees Dr Jerrye Noble    Patient Active Problem List   Diagnosis Date Noted  . Skin lesion 06/21/2020  . Pain of right thumb 05/25/2017  . Neuropathy 05/17/2016  . Left hip pain 01/15/2016  . Health care  maintenance 01/19/2015  . Dizziness 01/16/2015  . BMI 33.0-33.9,adult 09/14/2014  . Hypercholesterolemia 08/13/2012  . Thyroid nodule 08/13/2012  . Osteopenia 08/13/2012  . Myelodysplastic syndrome ([place]) 06/04/2012  . Hypertension 06/04/2012  . Diabetes mellitus ([place]) 06/04/2012  . Environmental allergies 06/04/2012    Past Surgical History:  Procedure Laterality Date  . ABDOMINAL HYSTERECTOMY    . [place]  . DILATION AND CURETTAGE OF UTERUS    . TONSILECTOMY, ADENOIDECTOMY, BILATERAL MYRINGOTOMY AND TUBES  1963  . TOTAL ABDOMINAL HYSTERECTOMY W/ BILATERAL SALPINGOOPHORECTOMY  1985   secondary to abnormal cells and abnormal uterine bleeding    OB History   No obstetric history on file.      Home Medications    Prior to Admission medications   Medication Sig Start Date End Date Taking? Authorizing Provider  ACCU-CHEK GUIDE test strip USE TO CHECK BLOOD SUGARS TWICE DAILY 06/30/20  Yes Einar Pheasant, MD  Accu-Chek Softclix Lancets lancets CHECK BLOOD SUGAR TWICE DAILY 12/30/19  Yes Einar Pheasant, MD  aspirin 81 MG tablet Take 81 mg by mouth daily.   Yes [provider]  azelastine (ASTELIN) 0.1 % nasal spray PLACE 1 SPRAY INTO BOTH NOSTRILS TWICE DAILY 05/18/20  Yes Einar Pheasant, MD  blood glucose meter kit and supplies KIT Dispense based on patient and insurance preference. Use to check sugars twice daily. DX E11.9 09/27/19  Yes Einar Pheasant, MD  Calcium Carbonate-Vitamin D 600-400 MG-UNIT tablet Take 1 tablet by mouth daily.  Reported on 01/15/2016   Yes [provider]  cyanocobalamin 500 MCG tablet Take 1,000 mcg by mouth daily.    Yes [provider]  fluticasone (FLONASE) 50 MCG/ACT nasal spray Place 2 sprays into both nostrils daily.   Yes [provider]  lenalidomide (REVLIMID) 10 MG capsule Take 10 mg by mouth daily.   Yes [provider]  losartan (COZAAR) 25 MG tablet TAKE 1 TABLET(25 MG) BY MOUTH  TWICE DAILY 05/21/20  Yes Einar Pheasant, MD  metFORMIN (GLUCOPHAGE) 1000 MG tablet TAKE 1 TABLET(1000 MG) BY MOUTH TWICE DAILY 02/21/20  Yes Einar Pheasant, MD  Multiple Vitamin (MULTIVITAMIN) tablet Take 1 tablet by mouth daily.   Yes [provider]  Multiple Vitamins-Minerals (PRESERVISION AREDS 2+MULTI VIT) CAPS Take 1 capsule by mouth 2 (two) times daily.   Yes [provider]  nystatin cream (MYCOSTATIN) Apply 1 application topically 2 (two) times daily as needed. 02/12/20  Yes Einar Pheasant, MD  rosuvastatin (CRESTOR) 10 MG tablet TAKE 1 TABLET(10 MG) BY MOUTH DAILY 02/21/20  Yes Einar Pheasant, MD  sitaGLIPtin (JANUVIA) 100 MG tablet TAKE 1 TABLET BY MOUTH DAILY 05/21/20  Yes Einar Pheasant, MD  mupirocin ointment (BACTROBAN) 2 % Apply to affected area bid 06/17/20   Einar Pheasant, MD    Family History Family History  Problem Relation Age of Onset  . Heart disease Father        Deceased (MI) - 34  . Diabetes Mother        Deceased  . Thyroid cancer Mother   . Osteoarthritis Mother   . Asthma Other   . Diabetes Brother   . Heart disease Maternal Grandmother        myocardial infarction - 55  . Asthma Maternal Grandmother   . Heart disease Paternal Grandmother        myocardial infarction-52  . Heart disease Brother   . Sinusitis Brother   . Depression Son   . Diabetes Son   . Depression Son   . Diabetes Son   . Depression Son   . Breast cancer Neg Hx     Social History Social History   Tobacco Use  . Smoking status: Never Smoker  . Smokeless tobacco: Never Used  Vaping Use  . Vaping Use: Never used  Substance Use Topics  . Alcohol use: No    Alcohol/week: 0.0 standard drinks  . Drug use: No     Allergies   Ibuprofen   Review of Systems Review of Systems  Constitutional: Positive for chills and fatigue. Negative for diaphoresis and fever.  HENT: Negative for congestion, ear pain, rhinorrhea, sinus pressure, sinus pain and sore  throat.   Eyes: Negative for photophobia and visual disturbance.  Respiratory: Positive for cough. Negative for shortness of breath and wheezing.   Cardiovascular: Positive for leg swelling (not increased from baseline). Negative for palpitations.  Gastrointestinal: Positive for diarrhea. Negative for abdominal pain, nausea and vomiting.  Genitourinary: Negative for dysuria, frequency and urgency.  Musculoskeletal: Negative for arthralgias, gait problem and myalgias.  Skin: Negative for rash.  Neurological: Positive for tremors and weakness. Negative for dizziness, seizures, syncope, facial asymmetry, speech difficulty, light-headedness, numbness and headaches.  Hematological: Negative for adenopathy.  Psychiatric/Behavioral: Positive for confusion. Negative for agitation, decreased concentration and hallucinations.     Physical Exam Triage Vital Signs ED Triage Vitals  Enc Vitals Group     BP 08/15/20 1528 119/67     Pulse Rate 08/15/20 1528 76  Resp 08/15/20 1528 18     Temp 08/15/20 1528 98.2 F (36.8 C)     Temp Source 08/15/20 1528 Oral     SpO2 08/15/20 1528 98 %     Weight 08/15/20 1529 162 lb (73.5 kg)     Height 08/15/20 1529 5' 1"  (1.549 m)     Head Circumference --      Peak Flow --      Pain Score 08/15/20 1528 0     Pain Loc --      Pain Edu? --      Excl. in [place]? --    No data found.  Updated Vital Signs BP 119/67 (BP Location: Left Arm)   Pulse 76   Temp 98.2 F (36.8 C) (Oral)   Resp 18   Ht 5' 1"  (1.549 m)   Wt 162 lb (73.5 kg)   LMP 08/13/1984   SpO2 98%   BMI 30.61 kg/m       Physical Exam Vitals and nursing note reviewed.  Constitutional:      General: She is not in acute distress.    Appearance: Normal appearance. She is not ill-appearing, toxic-appearing or diaphoretic.     Comments: Patient is alert and oriented, fully cooperative, and able to answer all of my questions.  HENT:     Head: Normocephalic and atraumatic.     Nose: Nose  normal.     Mouth/Throat:     Mouth: Mucous membranes are moist.     Pharynx: Oropharynx is clear.  Eyes:     General: No scleral icterus.       Right eye: No discharge.        Left eye: No discharge.     Conjunctiva/sclera: Conjunctivae normal.  Cardiovascular:     Rate and Rhythm: Normal rate and regular rhythm.     Pulses: Normal pulses.     Heart sounds: Normal heart sounds. No murmur heard.   Pulmonary:     Effort: Pulmonary effort is normal. No respiratory distress.     Breath sounds: Normal breath sounds. No wheezing, rhonchi or rales.  Abdominal:     Palpations: Abdomen is soft.     Tenderness: There is no abdominal tenderness.  Musculoskeletal:     Cervical back: Neck supple.     Right lower leg: Edema (mild to lower tibia bilaterally, non pitting) present.     Left lower leg: Edema present.  Skin:    General: Skin is dry.  Neurological:     General: No focal deficit present.     Mental Status: She is alert and oriented to person, place, and time. Mental status is at baseline.     Cranial Nerves: No cranial nerve deficit.     Motor: No weakness.     Coordination: Coordination normal. Finger-Nose-Finger Test normal.     Gait: Gait normal.     Comments: Patient has mild shaking tremors at rest and with action.  Shows normal finger-to-nose testing.  5 out of 5 strength bilateral upper and lower extremities  Psychiatric:        Mood and Affect: Mood normal.        Behavior: Behavior normal.        Thought Content: Thought content normal.      UC Treatments / Results  Labs (all labs ordered are listed, but only abnormal results are displayed) Labs Reviewed  URINALYSIS, COMPLETE (UACMP) WITH MICROSCOPIC - Abnormal; Notable for the following components:  Result Value   APPearance HAZY (*)    Hgb urine dipstick MODERATE (*)    Ketones, ur TRACE (*)    Bacteria, UA MANY (*)    All other components within normal limits  COMPREHENSIVE METABOLIC PANEL -  Abnormal; Notable for the following components:   Sodium 130 (*)    Chloride 97 (*)    Glucose, Bld 123 (*)    AST 13 (*)    All other components within normal limits  CBC WITH DIFFERENTIAL/PLATELET - Abnormal; Notable for the following components:   WBC 3.2 (*)    RBC 3.60 (*)    Hemoglobin 10.6 (*)    HCT 32.2 (*)    RDW 16.2 (*)    Platelets 145 (*)    Lymphs Abs 0.3 (*)    All other components within normal limits  URINE CULTURE    EKG   Radiology No results found.  Procedures Procedures (including critical care time)  Medications Ordered in UC Medications - No data to display  Initial Impression / Assessment and Plan / UC Course  I have reviewed the triage vital signs and the nursing notes.  Pertinent labs & imaging results that were available during my care of the patient were reviewed by me and considered in my medical decision making (see chart for details).   84 year old female has been brought in by a relative for concerns about increased fatigue and confusion over the past 2-1/2 weeks.  Other symptoms do include tremors, chills, and mild diarrhea.  She has significant medical history of type 2 diabetes and myelodysplastic syndrome.   All vital signs are normal and stable and she is well-appearing.  Completely alert and oriented and able to answer all questions.  Neurological exam all normal except she has mild shaking tremors at rest and with action.  They state that this is new.   Urinalysis was performed today which shows moderate blood and trace ketones.  Otherwise it is normal.  CBC obtained today and compared to CBC that she had done on 07/27/2020.  No significant changes, however she has pancytopenia with white blood cell count decreased to 3.2, red blood cells decreased to 3.6 and platelets decreased at 145.  Metabolic panel obtained which shows slightly elevated glucose at 123, normal creatinine, and sodium decreased to 130.  Review of labs from 07/27/2020  shows sodium at 135.  Decreased sodium is new over the last 2 and half weeks.  I discussed lab findings with patient and relative and advised that some of her symptoms may be coming from hyponatremia.  I did advise further work-up and treatment in the ED, but patient and relative want a wait until she is able to see her doctor hopefully on Monday.  I discussed the risks of untreated hyponatremia including seizures, coma, and brain herniation.  They state they are aware but not concerned.  They state they will go to the ED if any symptoms worsen.  ED precautions again reviewed with patient and relative.   Final Clinical Impressions(s) / UC Diagnoses   Final diagnoses:  Fatigue, unspecified type  Confusion  Hyponatremia  Other pancytopenia ([place])     Discharge Instructions     Sodium is very low and this could be a cause for all of the symptoms.  I do advise that she go to the emergency department this time for work-up and management of hyponatremia.  If you do not go to the emergency department, follow-up with your PCP as soon  as possible early this coming week.  Go to emergency department for any worsening symptoms.    ED Prescriptions    None     PDMP not reviewed this encounter.   Danton Clap, PA-C 08/16/20 [phone]

## 2020-08-15 NOTE — Discharge Instructions (Addendum)
Sodium is very low and this could be a cause for all of the symptoms.  I do advise that she go to the emergency department this time for work-up and management of hyponatremia.  If you do not go to the emergency department, follow-up with your PCP as soon as possible early this coming week.  Go to emergency department for any worsening symptoms.

## 2020-08-17 ENCOUNTER — Other Ambulatory Visit: Payer: Self-pay

## 2020-08-17 ENCOUNTER — Other Ambulatory Visit: Payer: Medicare PPO

## 2020-08-17 DIAGNOSIS — R5383 Other fatigue: Secondary | ICD-10-CM

## 2020-08-17 LAB — URINE CULTURE: Culture: <10000 — AB

## 2020-08-17 NOTE — Telephone Encounter (Signed)
Called and spoke with son. Pt is doing ok just extremely fatigued. Has little energy. He is coming in at 2:30 for her to be COVID tested. Advised we would schedule them for a virtual visit with Dr Nicki Reaper. Please advise what day you would like for me to schedule them and also which tests need to be ordered for this afternoon.

## 2020-08-17 NOTE — Telephone Encounter (Signed)
Pt's son Gaspar Bidding called and would like a call back to advise [phone].

## 2020-08-17 NOTE — Telephone Encounter (Signed)
Please call and see how pt is doing.  I feel she needs PCR test for covid.  Can schedule appt as we discussed to determine what is going on, etc.

## 2020-08-17 NOTE — Telephone Encounter (Signed)
We can do a virtual at 12:00 tomorrow.  Please order flu and covid test.  Thanks

## 2020-08-18 NOTE — Telephone Encounter (Signed)
08/19/20 at 12:00

## 2020-08-18 NOTE — Telephone Encounter (Signed)
Tried to call son x2. Unable to leave message

## 2020-08-18 NOTE — Telephone Encounter (Signed)
Patient scheduled for 12:00

## 2020-08-18 NOTE — Telephone Encounter (Signed)
Tried to call son to schedule. Unable to leave VM due to voicemail being full.

## 2020-08-19 ENCOUNTER — Emergency Department: Payer: Medicare PPO

## 2020-08-19 ENCOUNTER — Emergency Department
Admission: EM | Admit: 2020-08-19 | Discharge: 2020-08-19 | Disposition: A | Discharge status: Home / Self Care | Payer: Medicare PPO | Attending: Emergency Medicine | Admitting: Emergency Medicine

## 2020-08-19 ENCOUNTER — Other Ambulatory Visit: Payer: Self-pay

## 2020-08-19 ENCOUNTER — Encounter: Payer: Self-pay | Admitting: Emergency Medicine

## 2020-08-19 ENCOUNTER — Telehealth: Payer: Medicare PPO | Admitting: Internal Medicine

## 2020-08-19 ENCOUNTER — Telehealth: Payer: Self-pay | Admitting: Internal Medicine

## 2020-08-19 DIAGNOSIS — Z7984 Long term (current) use of oral hypoglycemic drugs: Secondary | ICD-10-CM | POA: Diagnosis not present

## 2020-08-19 DIAGNOSIS — R1031 Right lower quadrant pain: Secondary | ICD-10-CM | POA: Diagnosis not present

## 2020-08-19 DIAGNOSIS — J40 Bronchitis, not specified as acute or chronic: Secondary | ICD-10-CM

## 2020-08-19 DIAGNOSIS — Z7982 Long term (current) use of aspirin: Secondary | ICD-10-CM | POA: Insufficient documentation

## 2020-08-19 DIAGNOSIS — M533 Sacrococcygeal disorders, not elsewhere classified: Secondary | ICD-10-CM | POA: Diagnosis not present

## 2020-08-19 DIAGNOSIS — R059 Cough, unspecified: Secondary | ICD-10-CM

## 2020-08-19 DIAGNOSIS — E119 Type 2 diabetes mellitus without complications: Secondary | ICD-10-CM | POA: Insufficient documentation

## 2020-08-19 DIAGNOSIS — M419 Scoliosis, unspecified: Secondary | ICD-10-CM | POA: Diagnosis not present

## 2020-08-19 DIAGNOSIS — J209 Acute bronchitis, unspecified: Secondary | ICD-10-CM | POA: Insufficient documentation

## 2020-08-19 DIAGNOSIS — G319 Degenerative disease of nervous system, unspecified: Secondary | ICD-10-CM | POA: Diagnosis not present

## 2020-08-19 DIAGNOSIS — Y92009 Unspecified place in unspecified non-institutional (private) residence as the place of occurrence of the external cause: Secondary | ICD-10-CM | POA: Diagnosis not present

## 2020-08-19 DIAGNOSIS — J189 Pneumonia, unspecified organism: Secondary | ICD-10-CM | POA: Diagnosis not present

## 2020-08-19 DIAGNOSIS — S0083XA Contusion of other part of head, initial encounter: Secondary | ICD-10-CM | POA: Diagnosis not present

## 2020-08-19 DIAGNOSIS — S199XXA Unspecified injury of neck, initial encounter: Secondary | ICD-10-CM | POA: Diagnosis not present

## 2020-08-19 DIAGNOSIS — W19XXXA Unspecified fall, initial encounter: Secondary | ICD-10-CM | POA: Diagnosis not present

## 2020-08-19 DIAGNOSIS — R42 Dizziness and giddiness: Secondary | ICD-10-CM | POA: Insufficient documentation

## 2020-08-19 DIAGNOSIS — Z79899 Other long term (current) drug therapy: Secondary | ICD-10-CM | POA: Insufficient documentation

## 2020-08-19 DIAGNOSIS — W01198A Fall on same level from slipping, tripping and stumbling with subsequent striking against other object, initial encounter: Secondary | ICD-10-CM | POA: Diagnosis not present

## 2020-08-19 DIAGNOSIS — R5381 Other malaise: Secondary | ICD-10-CM | POA: Diagnosis not present

## 2020-08-19 DIAGNOSIS — M48061 Spinal stenosis, lumbar region without neurogenic claudication: Secondary | ICD-10-CM | POA: Diagnosis not present

## 2020-08-19 DIAGNOSIS — R109 Unspecified abdominal pain: Secondary | ICD-10-CM | POA: Diagnosis not present

## 2020-08-19 DIAGNOSIS — I6782 Cerebral ischemia: Secondary | ICD-10-CM | POA: Diagnosis not present

## 2020-08-19 DIAGNOSIS — S0990XA Unspecified injury of head, initial encounter: Secondary | ICD-10-CM | POA: Diagnosis not present

## 2020-08-19 DIAGNOSIS — I10 Essential (primary) hypertension: Secondary | ICD-10-CM | POA: Diagnosis not present

## 2020-08-19 DIAGNOSIS — R531 Weakness: Secondary | ICD-10-CM | POA: Diagnosis not present

## 2020-08-19 DIAGNOSIS — M76892 Other specified enthesopathies of left lower limb, excluding foot: Secondary | ICD-10-CM | POA: Diagnosis not present

## 2020-08-19 LAB — CBC WITH DIFFERENTIAL/PLATELET
Abs Immature Granulocytes: 0.03 10*3/uL (ref 0.00–0.07)
Basophils Absolute: 0 10*3/uL (ref 0.0–0.1)
Basophils Relative: 1 %
Eosinophils Absolute: 0 10*3/uL (ref 0.0–0.5)
Eosinophils Relative: 1 %
HCT: 31.9 % — ABNORMAL LOW (ref 36.0–46.0)
Hemoglobin: 10.6 g/dL — ABNORMAL LOW (ref 12.0–15.0)
Immature Granulocytes: 1 %
Lymphocytes Relative: 9 %
Lymphs Abs: 0.3 10*3/uL — ABNORMAL LOW (ref 0.7–4.0)
MCH: 29.9 pg (ref 26.0–34.0)
MCHC: 33.2 g/dL (ref 30.0–36.0)
MCV: 89.9 fL (ref 80.0–100.0)
Monocytes Absolute: 0.6 10*3/uL (ref 0.1–1.0)
Monocytes Relative: 17 %
Neutro Abs: 2.4 10*3/uL (ref 1.7–7.7)
Neutrophils Relative %: 71 %
Platelets: 124 10*3/uL — ABNORMAL LOW (ref 150–400)
RBC: 3.55 MIL/uL — ABNORMAL LOW (ref 3.87–5.11)
RDW: 15.9 % — ABNORMAL HIGH (ref 11.5–15.5)
WBC: 3.4 10*3/uL — ABNORMAL LOW (ref 4.0–10.5)
nRBC: 0 % (ref 0.0–0.2)

## 2020-08-19 LAB — URINALYSIS, COMPLETE (UACMP) WITH MICROSCOPIC
Bilirubin Urine: NEGATIVE
Glucose, UA: 50 mg/dL — AB
Ketones, ur: NEGATIVE mg/dL
Leukocytes,Ua: NEGATIVE
Nitrite: NEGATIVE
Protein, ur: NEGATIVE mg/dL
Specific Gravity, Urine: 1.009 (ref 1.005–1.030)
pH: 5 (ref 5.0–8.0)

## 2020-08-19 LAB — COMPREHENSIVE METABOLIC PANEL
ALT: 12 U/L (ref 0–44)
AST: 20 U/L (ref 15–41)
Albumin: 3.8 g/dL (ref 3.5–5.0)
Alkaline Phosphatase: 51 U/L (ref 38–126)
Anion gap: 11 (ref 5–15)
BUN: 10 mg/dL (ref 8–23)
CO2: 25 mmol/L (ref 22–32)
Calcium: 9.2 mg/dL (ref 8.9–10.3)
Chloride: 101 mmol/L (ref 98–111)
Creatinine, Ser: 0.77 mg/dL (ref 0.44–1.00)
GFR, Estimated: >60 mL/min (ref >60)
Glucose, Bld: 128 mg/dL — ABNORMAL HIGH (ref 70–99)
Potassium: 4.3 mmol/L (ref 3.5–5.1)
Sodium: 137 mmol/L (ref 135–145)
Total Bilirubin: 0.9 mg/dL (ref 0.3–1.2)
Total Protein: 6.8 g/dL (ref 6.5–8.1)

## 2020-08-19 LAB — BRAIN NATRIURETIC PEPTIDE: B Natriuretic Peptide: 61.8 pg/mL (ref 0.0–100.0)

## 2020-08-19 LAB — COVID-19, FLU A+B AND RSV
Influenza A, NAA: NOT DETECTED
Influenza B, NAA: NOT DETECTED
RSV, NAA: NOT DETECTED
SARS-CoV-2, NAA: NOT DETECTED

## 2020-08-19 MED ORDER — AZITHROMYCIN 250 MG PO TABS: ORAL_TABLET | ORAL | 0 refills | Status: AC

## 2020-08-19 MED ORDER — BENZONATATE 100 MG PO CAPS: 100.0000 mg | ORAL_CAPSULE | Freq: Three times a day (TID) | ORAL | 0 refills | Status: DC | PRN

## 2020-08-19 MED ORDER — IOHEXOL 300 MG/ML  SOLN
100.0000 mL | Freq: Once | INTRAMUSCULAR | Status: AC | PRN
  Administered 2020-08-19: 18:00:00 100 mL via INTRAVENOUS
  Filled 2020-08-19: qty 100

## 2020-08-19 NOTE — Telephone Encounter (Signed)
Kara Griffin in ER now.

## 2020-08-19 NOTE — ED Triage Notes (Addendum)
Pt comes into the ED via [facility] from home c/o a fall today from where she got dizzy and lost her balance.  Pt explains that she has a baseline of dizziness that has been ongoing for years..  Pt explained she hit her head on the door frame but denies any blood thinner use and there are no abrasions or swelling noted.  Pt in NAD at this time and is neurologically intact. Pt denies any pain from the fall.

## 2020-08-19 NOTE — ED Triage Notes (Signed)
First Nurse Note:  [place] at home.  Per report, patient felt dizzy.  Patient has not felt well since Thanksgiving.  VS wnl.

## 2020-08-19 NOTE — Telephone Encounter (Signed)
FYI will follow to confirm evaluated.

## 2020-08-19 NOTE — ED Provider Notes (Signed)
[facility] Emergency Department Provider Note  ____________________________________________   Event Date/Time   First MD Initiated Contact with Patient 08/19/20 1408     (approximate)  I have reviewed the triage vital signs and the nursing notes.   HISTORY  Chief Complaint Fall  HPI Kara Griffin is a 84 y.o. female who reports to the emergency department for evaluation of multiple medical complaints.  First, the patient sustained a fall today.  She states she went to take a shower, however felt dizzy and felt that she was unable to do so, stepped out and fell, hitting the back of her head on the door.  She did not lose consciousness.  She is concerned that she injured her low back and/or hips during the fall as she had some numb-like feeling in her bilateral legs.  The patient used her life alert bracelet in order to get help, and was brought here by EMS.  When asking about her dizziness, the patient states that she has had this ongoing for several years, however her dizziness acutely worsened in the last 2 to 3 weeks amongst other changes during that time.  She also reports a chronic cough with acute change in the last 2 to 3 weeks without any sputum production.  She reports chronic intermittent abdominal pain that has become constant in the right lower quadrant in the last 2 weeks.  She has had decreased appetite.  She was seen at an urgent care a few days ago when her family was concerned about possible UTI given her dizziness and recent falls, and at that time she was noted to have a hyponatremia.  At that time, they declined evaluation in the ER and were supposed to have follow-up today with her primary care until this fall occurred.  She denies any chest pain, shortness of breath, vision changes.  Family is present in the room and reports some confusion which seems to have been acutely worse in the last 2 to 3 weeks as well.  Family has transitioned her  from a cane to a walker at home, the patient reports that in general she feels that she can get around well with this.         Past Medical History:  Diagnosis Date  . Anemia   . Diabetes mellitus ([place])   . Environmental allergies   . Hypercholesterolemia   . Hypertension   . Myelodysplastic syndrome ([place])    Sees Dr Jerrye Noble    Patient Active Problem List   Diagnosis Date Noted  . Skin lesion 06/21/2020  . Pain of right thumb 05/25/2017  . Neuropathy 05/17/2016  . Left hip pain 01/15/2016  . Health care maintenance 01/19/2015  . Dizziness 01/16/2015  . BMI 33.0-33.9,adult 09/14/2014  . Hypercholesterolemia 08/13/2012  . Thyroid nodule 08/13/2012  . Osteopenia 08/13/2012  . Myelodysplastic syndrome ([place]) 06/04/2012  . Hypertension 06/04/2012  . Diabetes mellitus ([place]) 06/04/2012  . Environmental allergies 06/04/2012    Past Surgical History:  Procedure Laterality Date  . ABDOMINAL HYSTERECTOMY    . [place]  . DILATION AND CURETTAGE OF UTERUS    . TONSILECTOMY, ADENOIDECTOMY, BILATERAL MYRINGOTOMY AND TUBES  1963  . TOTAL ABDOMINAL HYSTERECTOMY W/ BILATERAL SALPINGOOPHORECTOMY  1985   secondary to abnormal cells and abnormal uterine bleeding    Prior to Admission medications   Medication Sig Start Date End Date Taking? Authorizing Provider  ACCU-CHEK GUIDE test strip USE TO CHECK BLOOD SUGARS TWICE DAILY  06/30/20   Einar Pheasant, MD  Accu-Chek Softclix Lancets lancets CHECK BLOOD SUGAR TWICE DAILY 12/30/19   Einar Pheasant, MD  aspirin 81 MG tablet Take 81 mg by mouth daily.    [provider]  azelastine (ASTELIN) 0.1 % nasal spray PLACE 1 SPRAY INTO BOTH NOSTRILS TWICE DAILY 05/18/20   Einar Pheasant, MD  azithromycin (ZITHROMAX Z-PAK) 250 MG tablet Take 2 tablets (500 mg) on  Day 1,  followed by 1 tablet (250 mg) once daily on Days 2 through 5. 08/19/20 08/24/20  Daniyah Fohl, Farrel Gordon, PA  benzonatate (TESSALON PERLES) 100 MG capsule  Take 1 capsule (100 mg total) by mouth 3 (three) times daily as needed for cough. 08/19/20 08/19/21  Marlana Salvage, PA  blood glucose meter kit and supplies KIT Dispense based on patient and insurance preference. Use to check sugars twice daily. DX E11.9 09/27/19   Einar Pheasant, MD  Calcium Carbonate-Vitamin D 600-400 MG-UNIT tablet Take 1 tablet by mouth daily. Reported on 01/15/2016    [provider]  cyanocobalamin 500 MCG tablet Take 1,000 mcg by mouth daily.     [provider]  fluticasone (FLONASE) 50 MCG/ACT nasal spray Place 2 sprays into both nostrils daily.    [provider]  lenalidomide (REVLIMID) 10 MG capsule Take 10 mg by mouth daily.    [provider]  losartan (COZAAR) 25 MG tablet TAKE 1 TABLET(25 MG) BY MOUTH TWICE DAILY 05/21/20   Einar Pheasant, MD  metFORMIN (GLUCOPHAGE) 1000 MG tablet TAKE 1 TABLET(1000 MG) BY MOUTH TWICE DAILY 02/21/20   Einar Pheasant, MD  Multiple Vitamin (MULTIVITAMIN) tablet Take 1 tablet by mouth daily.    [provider]  Multiple Vitamins-Minerals (PRESERVISION AREDS 2+MULTI VIT) CAPS Take 1 capsule by mouth 2 (two) times daily.    [provider]  mupirocin ointment (BACTROBAN) 2 % Apply to affected area bid 06/17/20   Einar Pheasant, MD  nystatin cream (MYCOSTATIN) Apply 1 application topically 2 (two) times daily as needed. 02/12/20   Einar Pheasant, MD  rosuvastatin (CRESTOR) 10 MG tablet TAKE 1 TABLET(10 MG) BY MOUTH DAILY 02/21/20   Einar Pheasant, MD  sitaGLIPtin (JANUVIA) 100 MG tablet TAKE 1 TABLET BY MOUTH DAILY 05/21/20   Einar Pheasant, MD    Allergies Ibuprofen  Family History  Problem Relation Age of Onset  . Heart disease Father        Deceased (MI) - 10  . Diabetes Mother        Deceased  . Thyroid cancer Mother   . Osteoarthritis Mother   . Asthma Other   . Diabetes Brother   . Heart disease Maternal Grandmother        myocardial infarction - 29  . Asthma  Maternal Grandmother   . Heart disease Paternal Grandmother        myocardial infarction-52  . Heart disease Brother   . Sinusitis Brother   . Depression Son   . Diabetes Son   . Depression Son   . Diabetes Son   . Depression Son   . Breast cancer Neg Hx     Social History Social History   Tobacco Use  . Smoking status: Never Smoker  . Smokeless tobacco: Never Used  Vaping Use  . Vaping Use: Never used  Substance Use Topics  . Alcohol use: No    Alcohol/week: 0.0 standard drinks  . Drug use: No    Review of Systems Constitutional: No fever/chills Eyes: No visual changes.  ENT: No sore throat. Cardiovascular: Denies chest pain. Respiratory: + Cough, denies shortness of breath. Gastrointestinal: RLQ  abdominal pain.  No nausea, no vomiting.  Intermittent diarrhea.  No constipation. Genitourinary: Negative for dysuria. Musculoskeletal: + for back pain. Skin: Negative for rash. Neurological: + Dizziness, negative for headaches, focal weakness or numbness.  ____________________________________________   PHYSICAL EXAM:  VITAL SIGNS: ED Triage Vitals  Enc Vitals Group     BP 08/19/20 1300 (!) 147/66     Pulse Rate 08/19/20 1300 85     Resp 08/19/20 1300 18     Temp 08/19/20 1300 97.9 F (36.6 C)     Temp Source 08/19/20 1300 Oral     SpO2 08/19/20 1300 98 %     Weight 08/19/20 1301 161 lb (73 kg)     Height 08/19/20 1301 _0  (1.549 m)     Head Circumference --      Peak Flow --      Pain Score 08/19/20 1300 0     Pain Loc --      Pain Edu? --      Excl. in [place]? --    Constitutional: Alert and oriented. Well appearing and in no acute distress. Eyes: Conjunctivae are normal. PERRL. EOMI. Head: Atraumatic.  No evidence of scalp hematoma or laceration. Nose: No congestion/rhinnorhea. Mouth/Throat: Mucous membranes are moist.  Oropharynx non-erythematous. Neck: No stridor.  No tenderness to palpation of the midline of the cervical spine, no step-off  deformities.  There is some mild tenderness to the right paraspinal muscle group, none to the left. Cardiovascular: Normal rate, regular rhythm. Grossly normal heart sounds.  Bilateral lower extremity edema, reported as stable by the patient. Respiratory: Normal respiratory effort.  No retractions. Lungs CTAB. Gastrointestinal: Soft and not distended there are bowel signs x4.  Right lower quadrant tenderness without guarding. No abdominal bruits. No CVA tenderness. Musculoskeletal: No lower extremity tenderness, bilateral lower extremity edema reported stable for the patient.  Negative logroll bilaterally, patient able to actively flex both hips without increase in pain.  No joint effusions. Neurologic:  Normal speech and language. No gross focal neurologic deficits are appreciated. No gait instability. Skin:  Skin is warm, dry and intact. No rash noted. Psychiatric: Mood and affect are normal. Speech and behavior are normal.  ____________________________________________   LABS (all labs ordered are listed, but only abnormal results are displayed)  Labs Reviewed  URINALYSIS, COMPLETE (UACMP) WITH MICROSCOPIC - Abnormal; Notable for the following components:      Result Value   Color, Urine YELLOW (*)    APPearance HAZY (*)    Glucose, UA 50 (*)    Hgb urine dipstick MODERATE (*)    Bacteria, UA FEW (*)    All other components within normal limits  COMPREHENSIVE METABOLIC PANEL - Abnormal; Notable for the following components:   Glucose, Bld 128 (*)    All other components within normal limits  CBC WITH DIFFERENTIAL/PLATELET - Abnormal; Notable for the following components:   WBC 3.4 (*)    RBC 3.55 (*)    Hemoglobin 10.6 (*)    HCT 31.9 (*)    RDW 15.9 (*)    Platelets 124 (*)    Lymphs Abs 0.3 (*)    All other components within normal limits  URINE CULTURE  BRAIN NATRIURETIC PEPTIDE   ____________________________________________  EKG  Normal sinus rhythm with evidence of a  first-degree AV block.  Rate of 84 bpm.  Normal QT interval.  No  ST elevations, depressions or T wave inversions to signify any acute ischemia.  This was compared to EKG of 06/25/2009 at which time she also had the AV block present. ____________________________________________  RADIOLOGY I, Marlana Salvage, personally viewed and evaluated these images (plain radiographs) as part of my medical decision making, as well as reviewing the written report by the radiologist.  ED provider interpretation: No acute focal pneumonia, no compression deformity or other fracture identified in the lumbar spine.  Official radiology report(s): DG Chest 1 View  Result Date: 08/19/2020 CLINICAL DATA:  Weakness with increased cough. Fall today. EXAM: CHEST  1 VIEW COMPARISON:  Radiograph 01/26/2016 FINDINGS: Heart is normal in size. Unchanged mediastinal contours. There is chronic hyperinflation. Bronchial thickening appears chronic but increased from prior exam. Subsegmental opacities in both mid lung zones and left lung base. Chronic biapical pleuroparenchymal scarring. No significant pleural effusion. No pneumothorax. No acute osseous abnormalities are seen. IMPRESSION: 1. Subsegmental opacities in both mid lung zones and left lung base, favor atelectasis or scarring, however atypical pneumonia could have a similar appearance. 2. Chronic but increased bronchial thickening may represent acute bronchitis. Chronic hyperinflation. Electronically Signed   By: Keith Rake M.D.   On: 08/19/2020 15:23   DG Lumbar Spine 2-3 Views  Result Date: 08/19/2020 CLINICAL DATA:  Weakness and cough. Fall today. EXAM: LUMBAR SPINE - 2-3 VIEW COMPARISON:  Lumbar radiograph 07/16/2018 FINDINGS: Slight dextroscoliotic curvature unchanged from prior exam. No listhesis. The vertebral body heights are preserved. Disc space narrowing and endplate spurring at M6-Q9, L4-L5, and L5-S1. There is lower lumbar facet hypertrophy. No acute  fracture. The sacroiliac joints are congruent. IMPRESSION: 1. No acute fracture or subluxation of the lumbar spine. 2. Mild scoliosis with stable degenerative change from 2019. Electronically Signed   By: Keith Rake M.D.   On: 08/19/2020 15:25   CT Head Wo Contrast  Result Date: 08/19/2020 CLINICAL DATA:  84 year old female with history of minor head trauma from a fall today. EXAM: CT HEAD WITHOUT CONTRAST CT CERVICAL SPINE WITHOUT CONTRAST TECHNIQUE: Multidetector CT imaging of the head and cervical spine was performed following the standard protocol without intravenous contrast. Multiplanar CT image reconstructions of the cervical spine were also generated. COMPARISON:  None. No priors. FINDINGS: CT HEAD FINDINGS Brain: Mild cerebral atrophy. Patchy and confluent areas of decreased attenuation are noted throughout the deep and periventricular white matter of the cerebral hemispheres bilaterally, compatible with chronic microvascular ischemic disease. No evidence of acute infarction, hemorrhage, hydrocephalus, extra-axial collection or mass lesion/mass effect. Vascular: No hyperdense vessel or unexpected calcification. Skull: Normal. Negative for fracture or focal lesion. Sinuses/Orbits: No acute finding. Other: None. CT CERVICAL SPINE FINDINGS Alignment: Normal. Skull base and vertebrae: No acute fracture. No primary bone lesion or focal pathologic process. Soft tissues and spinal canal: No prevertebral fluid or swelling. No visible canal hematoma. Disc levels: Very mild multilevel degenerative disc disease and facet arthropathy. Upper chest: Bilateral apical pleuroparenchymal thickening and architectural distortion, most compatible with areas of chronic post infectious or inflammatory scarring. Other: None. IMPRESSION: 1. No evidence of significant acute traumatic injury to the skull, brain or cervical spine. 2. Mild cerebral atrophy with chronic microvascular ischemic changes in the cerebral white  matter. 3. Very mild multilevel degenerative disc disease and facet arthropathy. Electronically Signed   By: Vinnie Langton M.D.   On: 08/19/2020 18:42   CT Cervical Spine Wo Contrast  Result Date: 08/19/2020 CLINICAL DATA:  84 year old female with history of minor head  trauma from a fall today. EXAM: CT HEAD WITHOUT CONTRAST CT CERVICAL SPINE WITHOUT CONTRAST TECHNIQUE: Multidetector CT imaging of the head and cervical spine was performed following the standard protocol without intravenous contrast. Multiplanar CT image reconstructions of the cervical spine were also generated. COMPARISON:  None. No priors. FINDINGS: CT HEAD FINDINGS Brain: Mild cerebral atrophy. Patchy and confluent areas of decreased attenuation are noted throughout the deep and periventricular white matter of the cerebral hemispheres bilaterally, compatible with chronic microvascular ischemic disease. No evidence of acute infarction, hemorrhage, hydrocephalus, extra-axial collection or mass lesion/mass effect. Vascular: No hyperdense vessel or unexpected calcification. Skull: Normal. Negative for fracture or focal lesion. Sinuses/Orbits: No acute finding. Other: None. CT CERVICAL SPINE FINDINGS Alignment: Normal. Skull base and vertebrae: No acute fracture. No primary bone lesion or focal pathologic process. Soft tissues and spinal canal: No prevertebral fluid or swelling. No visible canal hematoma. Disc levels: Very mild multilevel degenerative disc disease and facet arthropathy. Upper chest: Bilateral apical pleuroparenchymal thickening and architectural distortion, most compatible with areas of chronic post infectious or inflammatory scarring. Other: None. IMPRESSION: 1. No evidence of significant acute traumatic injury to the skull, brain or cervical spine. 2. Mild cerebral atrophy with chronic microvascular ischemic changes in the cerebral white matter. 3. Very mild multilevel degenerative disc disease and facet arthropathy.  Electronically Signed   By: Vinnie Langton M.D.   On: 08/19/2020 18:42   CT ABDOMEN PELVIS W CONTRAST  Result Date: 08/19/2020 CLINICAL DATA:  Right lower quadrant abdominal pain. Fall at home. Dizziness. EXAM: CT ABDOMEN AND PELVIS WITH CONTRAST TECHNIQUE: Multidetector CT imaging of the abdomen and pelvis was performed using the standard protocol following bolus administration of intravenous contrast. CONTRAST:  170m OMNIPAQUE IOHEXOL 300 MG/ML  SOLN COMPARISON:  None. FINDINGS: Lower chest: Right middle lobe bronchiectasis with distal mucous plugging. Reticulonodular opacities in the lingula. Subsegmental opacities in the left lower lobe likely atelectasis. No pleural fluid. No pericardial fluid. No basilar rib fracture. Hepatobiliary: Elongated right lobe of the liver. No focal hepatic abnormality. No evidence of perihepatic hematoma or hepatic injury. Distended gallbladder without pericholecystic inflammation or calcified gallstone. No biliary dilatation. Pancreas: No ductal dilatation or inflammation. Spleen: Subcentimeter hypodensity in the anterior spleen is nonspecific, typically benign. No splenomegaly. No splenic injury or perisplenic hematoma. Adrenals/Urinary Tract: No renal or adrenal injury. There is a 14 mm left adrenal nodule. Lobulated bilateral renal contours. Bilateral cortical cysts. Symmetric prominence of both renal pelvis likely extrarenal pelvis configuration. No perinephric edema. No renal calculi. The urinary bladder is distended. No bladder wall thickening or injury. Stomach/Bowel: Tiny hiatal hernia. The stomach is nondistended. No bowel obstruction, inflammation, or evidence of injury. Sigmoid colon is redundant. Small volume of colonic stool. The appendix is not confidently visualized. Vascular/Lymphatic: Aortic atherosclerosis. Patent portal vein. No acute vascular findings or evidence of injury. No adenopathy. Reproductive: Status post hysterectomy. No adnexal masses. Other:  No free air or free fluid. Postsurgical changes the anterior abdominal wall. No body wall hernia. Musculoskeletal: No acute fracture of the pelvis, lumbar spine, or included lower ribs. Multilevel degenerative change with vacuum phenomena and facet hypertrophy in the lumbar spine. IMPRESSION: 1. The appendix is not visualized, there is no evidence of appendicitis. 2. No acute traumatic injury to the abdomen or pelvis. 3. Distended gallbladder without pericholecystic inflammation or calcified gallstone. If there is clinical concern for acute gallbladder pathology, recommend right upper quadrant ultrasound. 4. Right middle lobe bronchiectasis with distal mucous plugging. Reticulonodular opacities in  the lingula. Findings suggest prior mycobacterium avium infection. 5. Left adrenal nodule measuring 14 mm, nonspecific. In the absence of known malignancy, this is likely benign. Aortic Atherosclerosis (ICD10-I70.0). Electronically Signed   By: Keith Rake M.D.   On: 08/19/2020 18:43   DG Pelvis Portable  Result Date: 08/19/2020 CLINICAL DATA:  Weakness. Fall today. EXAM: PORTABLE PELVIS 1-2 VIEWS COMPARISON:  Pelvis and right hip radiograph on 07/16/2018 FINDINGS: The cortical margins of the bony pelvis are intact. No fracture. Pubic symphysis and sacroiliac joints are congruent. Bilateral acetabular spurring. Both femoral heads are well-seated in the respective acetabula. IMPRESSION: No pelvic fracture. Electronically Signed   By: Keith Rake M.D.   On: 08/19/2020 15:26    ____________________________________________   INITIAL IMPRESSION / ASSESSMENT AND PLAN / ED COURSE  As part of my medical decision making, I reviewed the following data within the [facility] History obtained from family, Nursing notes reviewed and incorporated, Labs reviewed, Old EKG reviewed, Old chart reviewed, Radiograph reviewed and Notes from prior ED visits        Patient is an 84 year old female who  presents to the emergency department for evaluation of acute fall in which time she hit the back of her head.  The fall was caused by dizziness, which the family and patient report have been worse in the last 2 to 3 weeks since Thanksgiving.  This worsening has been along with other symptoms including worsening cough, right lower quadrant pain, decreased appetite.  She was seen at urgent care and diagnosed with hyponatremia at which time the family did not seek further evaluation for the patient.  See HPI for further details  On physical exam, the patient does not have any signs of trauma to the head, neck has mild tenderness.  The patient also has tenderness without guarding to the right lower quadrant.  She does not have any pain with palpation of the pelvis, logroll or active flexion of both hips.  There are no acute neurological findings to suggest acute stroke or other neuro abnormality.  Evaluation in the ER began with urinalysis, CMP, CBC, BNP.  BNP is within normal limits, CMP grossly normal.  Urinalysis does demonstrate a moderate amount of hemoglobin as well as a few bacteria but no leukocytes or nitrites.  This will be sent for culture.  CBC does demonstrate some pancytopenia, however this appears consistent with the patient's most recent labs and does not appear to deviate from her baseline.  X-ray of the chest, lumbar spine and pelvis were obtained.  Chest x-ray raised suspicion for possible atypical pneumonia versus bronchitis appearance versus scarring.  CT of the abdomen and pelvis incidentally caught a portion of the lungs which suggest prior atypical pneumonia infection.  X-rays of the lumbar spine and pelvis do not indicate any acute fracture.  CT of the head and neck are negative for any acute findings.  The CT of the abdomen pelvis also does not demonstrate any intra-abdominal finding to explain the patient's right lower quadrant pain.  EKG does demonstrate a first-degree AV node block however  this was present in prior EKG from 2010 and does not represent acute change.  Discussed the findings in detail with the patient and her son who is present in the room.  I do not have a specific cause for the patient's dizziness, and recommended that she follow-up with her primary care to determine if this is medication related or other.  Will begin treatment with azithromycin for  possible atypical pneumonia versus bronchitis and will treat the patient's cough with Tessalon Perles.  The patient and her son are amenable with this plan.  She is stable, ambulatory and safe at this time for outpatient follow-up.  They were instructed on strict return precautions and the patient and her son are amenable with this plan.      ____________________________________________   FINAL CLINICAL IMPRESSION(S) / ED DIAGNOSES  Final diagnoses:  Right lower quadrant pain  Fall in home, initial encounter  Contusion of other part of head, initial encounter  Cough  Bronchitis  Dizziness     ED Discharge Orders         Ordered    azithromycin (ZITHROMAX Z-PAK) 250 MG tablet        08/19/20 1908    benzonatate (TESSALON PERLES) 100 MG capsule  3 times daily PRN        08/19/20 1908          *Please note:  Gearline Spilman was evaluated in Emergency Department on 08/19/2020 for the symptoms described in the history of present illness. She was evaluated in the context of the global COVID-19 pandemic, which necessitated consideration that the patient might be at risk for infection with the SARS-CoV-2 virus that causes COVID-19. Institutional protocols and algorithms that pertain to the evaluation of patients at risk for COVID-19 are in a state of rapid change based on information released by regulatory bodies including the CDC and federal and state organizations. These policies and algorithms were followed during the patient's care in the ED.  Some ED evaluations and interventions may be delayed as a result  of limited staffing during and the pandemic.*   Note:  This document was prepared using Dragon voice recognition software and may include unintentional dictation errors.    Marlana Salvage, PA 08/19/20 1624    Harvest Dark, MD 08/22/20 2143

## 2020-08-19 NOTE — Telephone Encounter (Signed)
Pt son called to cancel her appt for today  EMS is taking her to the ED she was having dizziness, leg pain and she had a fall

## 2020-08-20 LAB — URINE CULTURE

## 2020-08-20 NOTE — Telephone Encounter (Signed)
Pt's son Earley Favor called and wants to get pt on the schedule for fall f/u

## 2020-08-20 NOTE — Telephone Encounter (Signed)
If no acute symptoms and passes screening, ok.  Confirm doing ok.

## 2020-08-20 NOTE — Telephone Encounter (Signed)
Called to move up appt. Scheduled for 12/21 at 12. Son is requesting in office. Patient has had two negative COVID tests and stated the only symptoms she has is brain fog, fatigue, and lack of mobility. Are you okay with seeing her in the office?

## 2020-08-20 NOTE — Telephone Encounter (Signed)
See phone note

## 2020-08-21 NOTE — Telephone Encounter (Signed)
Left detailed message for pt 

## 2020-08-24 ENCOUNTER — Encounter: Payer: Self-pay | Admitting: Internal Medicine

## 2020-08-25 ENCOUNTER — Ambulatory Visit: Payer: Medicare PPO | Admitting: Internal Medicine

## 2020-08-25 ENCOUNTER — Other Ambulatory Visit: Payer: Self-pay

## 2020-08-25 DIAGNOSIS — R42 Dizziness and giddiness: Secondary | ICD-10-CM

## 2020-08-25 DIAGNOSIS — M549 Dorsalgia, unspecified: Secondary | ICD-10-CM

## 2020-08-25 DIAGNOSIS — I10 Essential (primary) hypertension: Secondary | ICD-10-CM

## 2020-08-25 DIAGNOSIS — E1165 Type 2 diabetes mellitus with hyperglycemia: Secondary | ICD-10-CM

## 2020-08-25 DIAGNOSIS — W19XXXA Unspecified fall, initial encounter: Secondary | ICD-10-CM

## 2020-08-25 DIAGNOSIS — R410 Disorientation, unspecified: Secondary | ICD-10-CM

## 2020-08-25 DIAGNOSIS — E7800 Pure hypercholesterolemia, unspecified: Secondary | ICD-10-CM | POA: Diagnosis not present

## 2020-08-25 DIAGNOSIS — R197 Diarrhea, unspecified: Secondary | ICD-10-CM | POA: Insufficient documentation

## 2020-08-25 DIAGNOSIS — D469 Myelodysplastic syndrome, unspecified: Secondary | ICD-10-CM

## 2020-08-25 NOTE — Telephone Encounter (Signed)
Printed info for patient.

## 2020-08-25 NOTE — Progress Notes (Signed)
Patient ID: Tabbitha Janvrin, female   DOB: [birth year], 84 y.o.   MRN: [id number]   Subjective:    Patient ID: Aizley Stenseth, female    DOB: 1936/04/18, 84 y.o.   MRN: [id number]  HPI This visit occurred during the SARS-CoV-2 public health emergency.  Safety protocols were in place, including screening questions prior to the visit, additional usage of staff PPE, and extensive cleaning of exam room while observing appropriate contact time as indicated for disinfecting solutions.  Patient here as a work in with concerns regarding weakness, dizziness and decreased appetite.  She is accompanied by her son Gaspar Bidding).  History obtained from both of them.  Reports that she noticed increased fatigue after Thanksgiving.  Some intermittent headache.  Also intermittent symptoms including:  Cough (occasionally productive of colored sputum/blood tinged), shakiness/tremulous, light headedness, diarrhea, scratchy throat.  No chest pain or increased sob.  No urinary symptoms. Evaluated 08/15/20 - urgent care- labs revealed sodium 130.  CBC stable.  Urine culture - insignificant growth.  Discharged to f/u here.  Was reevaluated 08/19/20 in ER s/p fall.  Went to take a shower and felt dizzy.  Stepped out and fell, hitting her head on the door.  Also landed on her tail bone.  Denies LOC. Notticed increased pain right side/back.  CXR - subsegmental opacities in both mid lung zones and left lung base - favor atelectasis or scarring, increased bronchial thickening.  L-S spine xray - no acute fracture.  No pelvic fracture.  CT head - no acute changes. gvein zpak and tessalon perles. covid negative.  F/u sodium 137.  Since discharge from ER, continued weakness.  Using her walker. Decreased appetite.  Some nausea.  Family staying with her.  Encouraging her to eat. No increased headache or dizziness since fall.  No chest pain.  Breathing she feels is stable. Continued pain - back.  Limits movement.  Some diarrhea now.   Taking imodium prn.    Past Medical History:  Diagnosis Date  . Anemia   . Diabetes mellitus ([place])   . Environmental allergies   . Hypercholesterolemia   . Hypertension   . Myelodysplastic syndrome ([place])    Sees Dr Jerrye Noble   Past Surgical History:  Procedure Laterality Date  . ABDOMINAL HYSTERECTOMY    . [place]  . DILATION AND CURETTAGE OF UTERUS    . TONSILECTOMY, ADENOIDECTOMY, BILATERAL MYRINGOTOMY AND TUBES  1963  . TOTAL ABDOMINAL HYSTERECTOMY W/ BILATERAL SALPINGOOPHORECTOMY  1985   secondary to abnormal cells and abnormal uterine bleeding   Family History  Problem Relation Age of Onset  . Heart disease Father        Deceased (MI) - 76  . Diabetes Mother        Deceased  . Thyroid cancer Mother   . Osteoarthritis Mother   . Asthma Other   . Diabetes Brother   . Heart disease Maternal Grandmother        myocardial infarction - 85  . Asthma Maternal Grandmother   . Heart disease Paternal Grandmother        myocardial infarction-52  . Heart disease Brother   . Sinusitis Brother   . Depression Son   . Diabetes Son   . Depression Son   . Diabetes Son   . Depression Son   . Breast cancer Neg Hx    Social History   Socioeconomic History  . Marital status: Divorced    Spouse name: Not on file  .  Number of children: 3  . Years of education: Not on file  . Highest education level: Not on file  Occupational History  . Occupation: retired Games developer  Tobacco Use  . Smoking status: Never Smoker  . Smokeless tobacco: Never Used  Vaping Use  . Vaping Use: Never used  Substance and Sexual Activity  . Alcohol use: No    Alcohol/week: 0.0 standard drinks  . Drug use: No  . Sexual activity: Never  Other Topics Concern  . Not on file  Social History Narrative  . Not on file   Social Determinants of Health   Financial Resource Strain: Not on file  Food Insecurity: Not on file  Transportation Needs: Not on file  Physical Activity: Not on  file  Stress: Not on file  Social Connections: Not on file    Outpatient Encounter Medications as of 08/25/2020  Medication Sig  . ACCU-CHEK GUIDE test strip USE TO CHECK BLOOD SUGARS TWICE DAILY  . Accu-Chek Softclix Lancets lancets CHECK BLOOD SUGAR TWICE DAILY  . aspirin 81 MG tablet Take 81 mg by mouth daily.  Marland Kitchen azelastine (ASTELIN) 0.1 % nasal spray PLACE 1 SPRAY INTO BOTH NOSTRILS TWICE DAILY  . blood glucose meter kit and supplies KIT Dispense based on patient and insurance preference. Use to check sugars twice daily. DX E11.9  . Calcium Carbonate-Vitamin D 600-400 MG-UNIT tablet Take 1 tablet by mouth daily. Reported on 01/15/2016  . cyanocobalamin 500 MCG tablet Take 1,000 mcg by mouth daily.   . fluticasone (FLONASE) 50 MCG/ACT nasal spray Place 2 sprays into both nostrils daily.  Marland Kitchen lenalidomide (REVLIMID) 10 MG capsule Take 10 mg by mouth daily.  Marland Kitchen losartan (COZAAR) 25 MG tablet TAKE 1 TABLET(25 MG) BY MOUTH TWICE DAILY  . metFORMIN (GLUCOPHAGE) 1000 MG tablet TAKE 1 TABLET(1000 MG) BY MOUTH TWICE DAILY  . Multiple Vitamin (MULTIVITAMIN) tablet Take 1 tablet by mouth daily.  . Multiple Vitamins-Minerals (PRESERVISION AREDS 2+MULTI VIT) CAPS Take 1 capsule by mouth 2 (two) times daily.  . mupirocin ointment (BACTROBAN) 2 % Apply to affected area bid  . nystatin cream (MYCOSTATIN) Apply 1 application topically 2 (two) times daily as needed.  . rosuvastatin (CRESTOR) 10 MG tablet TAKE 1 TABLET(10 MG) BY MOUTH DAILY  . sitaGLIPtin (JANUVIA) 100 MG tablet TAKE 1 TABLET BY MOUTH DAILY  . [DISCONTINUED] benzonatate (TESSALON PERLES) 100 MG capsule Take 1 capsule (100 mg total) by mouth 3 (three) times daily as needed for cough.   No facility-administered encounter medications on file as of 08/25/2020.    Review of Systems  Constitutional: Positive for appetite change and fatigue. Negative for unexpected weight change.  HENT: Positive for congestion. Negative for sinus pressure.    Respiratory: Positive for cough. Negative for chest tightness and shortness of breath.   Cardiovascular: Negative for chest pain.       No increased heart rate reported.   Gastrointestinal: Positive for diarrhea and nausea. Negative for abdominal pain and vomiting.  Genitourinary: Negative for difficulty urinating and dysuria.  Musculoskeletal: Positive for back pain. Negative for joint swelling and myalgias.  Skin: Negative for color change and rash.  Neurological: Positive for dizziness.       No increased headache.   Psychiatric/Behavioral: Negative for agitation and dysphoric mood.       Objective:    Physical Exam Vitals reviewed.  Constitutional:      General: She is not in acute distress.    Appearance: Normal appearance.  HENT:  Head: Normocephalic and atraumatic.     Right Ear: External ear normal.     Left Ear: External ear normal.     Mouth/Throat:     Mouth: Oropharynx is clear and moist.  Eyes:     General: No scleral icterus.       Right eye: No discharge.        Left eye: No discharge.     Conjunctiva/sclera: Conjunctivae normal.  Neck:     Thyroid: No thyromegaly.  Cardiovascular:     Rate and Rhythm: Normal rate and regular rhythm.  Pulmonary:     Effort: No respiratory distress.     Breath sounds: Normal breath sounds. No wheezing.  Abdominal:     General: Bowel sounds are normal.     Palpations: Abdomen is soft.     Tenderness: There is no abdominal tenderness.  Musculoskeletal:        General: No edema.     Cervical back: Neck supple. No tenderness.     Comments: Increased back pain with going from sitting to standing and from sitting to lying down and lying down to sitting up.   Lymphadenopathy:     Cervical: No cervical adenopathy.  Skin:    Findings: No erythema or rash.  Neurological:     Mental Status: She is alert.     Comments: Answering questions.  Oriented.    Psychiatric:        Mood and Affect: Mood normal.        Behavior:  Behavior normal.     BP 122/70   Pulse 81   Temp 97.7 F (36.5 C) (Oral)   Resp 16   Ht _0  (1.549 m)   Wt 159 lb (72.1 kg)   LMP 08/13/1984   SpO2 98%   BMI 30.04 kg/m  Wt Readings from Last 3 Encounters:  08/25/20 159 lb (72.1 kg)  08/19/20 161 lb (73 kg)  08/15/20 162 lb (73.5 kg)     Lab Results  Component Value Date   WBC 3.4 (L) 08/19/2020   HGB 10.6 (L) 08/19/2020   HCT 31.9 (L) 08/19/2020   PLT 124 (L) 08/19/2020   GLUCOSE 128 (H) 08/19/2020   CHOL 121 06/17/2020   TRIG 216.0 (H) 06/17/2020   HDL 44.70 06/17/2020   LDLDIRECT 53.0 06/17/2020   LDLCALC 31 02/08/2019   ALT 12 08/19/2020   AST 20 08/19/2020   NA 137 08/19/2020   K 4.3 08/19/2020   CL 101 08/19/2020   CREATININE 0.77 08/19/2020   BUN 10 08/19/2020   CO2 25 08/19/2020   TSH 2.07 06/17/2020   INR 0.9 05/08/2019   HGBA1C 6.7 (H) 06/17/2020   MICROALBUR 1.2 06/17/2020    DG Chest 1 View  Result Date: 08/19/2020 CLINICAL DATA:  Weakness with increased cough. Fall today. EXAM: CHEST  1 VIEW COMPARISON:  Radiograph 01/26/2016 FINDINGS: Heart is normal in size. Unchanged mediastinal contours. There is chronic hyperinflation. Bronchial thickening appears chronic but increased from prior exam. Subsegmental opacities in both mid lung zones and left lung base. Chronic biapical pleuroparenchymal scarring. No significant pleural effusion. No pneumothorax. No acute osseous abnormalities are seen. IMPRESSION: 1. Subsegmental opacities in both mid lung zones and left lung base, favor atelectasis or scarring, however atypical pneumonia could have a similar appearance. 2. Chronic but increased bronchial thickening may represent acute bronchitis. Chronic hyperinflation. Electronically Signed   By: Keith Rake M.D.   On: 08/19/2020 15:23   DG Lumbar Spine 2-3  Views  Result Date: 08/19/2020 CLINICAL DATA:  Weakness and cough. Fall today. EXAM: LUMBAR SPINE - 2-3 VIEW COMPARISON:  Lumbar radiograph  07/16/2018 FINDINGS: Slight dextroscoliotic curvature unchanged from prior exam. No listhesis. The vertebral body heights are preserved. Disc space narrowing and endplate spurring at Z6-X0, L4-L5, and L5-S1. There is lower lumbar facet hypertrophy. No acute fracture. The sacroiliac joints are congruent. IMPRESSION: 1. No acute fracture or subluxation of the lumbar spine. 2. Mild scoliosis with stable degenerative change from 2019. Electronically Signed   By: Keith Rake M.D.   On: 08/19/2020 15:25   CT Head Wo Contrast  Result Date: 08/19/2020 CLINICAL DATA:  84 year old female with history of minor head trauma from a fall today. EXAM: CT HEAD WITHOUT CONTRAST CT CERVICAL SPINE WITHOUT CONTRAST TECHNIQUE: Multidetector CT imaging of the head and cervical spine was performed following the standard protocol without intravenous contrast. Multiplanar CT image reconstructions of the cervical spine were also generated. COMPARISON:  None. No priors. FINDINGS: CT HEAD FINDINGS Brain: Mild cerebral atrophy. Patchy and confluent areas of decreased attenuation are noted throughout the deep and periventricular white matter of the cerebral hemispheres bilaterally, compatible with chronic microvascular ischemic disease. No evidence of acute infarction, hemorrhage, hydrocephalus, extra-axial collection or mass lesion/mass effect. Vascular: No hyperdense vessel or unexpected calcification. Skull: Normal. Negative for fracture or focal lesion. Sinuses/Orbits: No acute finding. Other: None. CT CERVICAL SPINE FINDINGS Alignment: Normal. Skull base and vertebrae: No acute fracture. No primary bone lesion or focal pathologic process. Soft tissues and spinal canal: No prevertebral fluid or swelling. No visible canal hematoma. Disc levels: Very mild multilevel degenerative disc disease and facet arthropathy. Upper chest: Bilateral apical pleuroparenchymal thickening and architectural distortion, most compatible with areas of  chronic post infectious or inflammatory scarring. Other: None. IMPRESSION: 1. No evidence of significant acute traumatic injury to the skull, brain or cervical spine. 2. Mild cerebral atrophy with chronic microvascular ischemic changes in the cerebral white matter. 3. Very mild multilevel degenerative disc disease and facet arthropathy. Electronically Signed   By: Vinnie Langton M.D.   On: 08/19/2020 18:42   CT Cervical Spine Wo Contrast  Result Date: 08/19/2020 CLINICAL DATA:  84 year old female with history of minor head trauma from a fall today. EXAM: CT HEAD WITHOUT CONTRAST CT CERVICAL SPINE WITHOUT CONTRAST TECHNIQUE: Multidetector CT imaging of the head and cervical spine was performed following the standard protocol without intravenous contrast. Multiplanar CT image reconstructions of the cervical spine were also generated. COMPARISON:  None. No priors. FINDINGS: CT HEAD FINDINGS Brain: Mild cerebral atrophy. Patchy and confluent areas of decreased attenuation are noted throughout the deep and periventricular white matter of the cerebral hemispheres bilaterally, compatible with chronic microvascular ischemic disease. No evidence of acute infarction, hemorrhage, hydrocephalus, extra-axial collection or mass lesion/mass effect. Vascular: No hyperdense vessel or unexpected calcification. Skull: Normal. Negative for fracture or focal lesion. Sinuses/Orbits: No acute finding. Other: None. CT CERVICAL SPINE FINDINGS Alignment: Normal. Skull base and vertebrae: No acute fracture. No primary bone lesion or focal pathologic process. Soft tissues and spinal canal: No prevertebral fluid or swelling. No visible canal hematoma. Disc levels: Very mild multilevel degenerative disc disease and facet arthropathy. Upper chest: Bilateral apical pleuroparenchymal thickening and architectural distortion, most compatible with areas of chronic post infectious or inflammatory scarring. Other: None. IMPRESSION: 1. No evidence  of significant acute traumatic injury to the skull, brain or cervical spine. 2. Mild cerebral atrophy with chronic microvascular ischemic changes in the cerebral white  matter. 3. Very mild multilevel degenerative disc disease and facet arthropathy. Electronically Signed   By: Vinnie Langton M.D.   On: 08/19/2020 18:42   CT ABDOMEN PELVIS W CONTRAST  Result Date: 08/19/2020 CLINICAL DATA:  Right lower quadrant abdominal pain. Fall at home. Dizziness. EXAM: CT ABDOMEN AND PELVIS WITH CONTRAST TECHNIQUE: Multidetector CT imaging of the abdomen and pelvis was performed using the standard protocol following bolus administration of intravenous contrast. CONTRAST:  141m OMNIPAQUE IOHEXOL 300 MG/ML  SOLN COMPARISON:  None. FINDINGS: Lower chest: Right middle lobe bronchiectasis with distal mucous plugging. Reticulonodular opacities in the lingula. Subsegmental opacities in the left lower lobe likely atelectasis. No pleural fluid. No pericardial fluid. No basilar rib fracture. Hepatobiliary: Elongated right lobe of the liver. No focal hepatic abnormality. No evidence of perihepatic hematoma or hepatic injury. Distended gallbladder without pericholecystic inflammation or calcified gallstone. No biliary dilatation. Pancreas: No ductal dilatation or inflammation. Spleen: Subcentimeter hypodensity in the anterior spleen is nonspecific, typically benign. No splenomegaly. No splenic injury or perisplenic hematoma. Adrenals/Urinary Tract: No renal or adrenal injury. There is a 14 mm left adrenal nodule. Lobulated bilateral renal contours. Bilateral cortical cysts. Symmetric prominence of both renal pelvis likely extrarenal pelvis configuration. No perinephric edema. No renal calculi. The urinary bladder is distended. No bladder wall thickening or injury. Stomach/Bowel: Tiny hiatal hernia. The stomach is nondistended. No bowel obstruction, inflammation, or evidence of injury. Sigmoid colon is redundant. Small volume of  colonic stool. The appendix is not confidently visualized. Vascular/Lymphatic: Aortic atherosclerosis. Patent portal vein. No acute vascular findings or evidence of injury. No adenopathy. Reproductive: Status post hysterectomy. No adnexal masses. Other: No free air or free fluid. Postsurgical changes the anterior abdominal wall. No body wall hernia. Musculoskeletal: No acute fracture of the pelvis, lumbar spine, or included lower ribs. Multilevel degenerative change with vacuum phenomena and facet hypertrophy in the lumbar spine. IMPRESSION: 1. The appendix is not visualized, there is no evidence of appendicitis. 2. No acute traumatic injury to the abdomen or pelvis. 3. Distended gallbladder without pericholecystic inflammation or calcified gallstone. If there is clinical concern for acute gallbladder pathology, recommend right upper quadrant ultrasound. 4. Right middle lobe bronchiectasis with distal mucous plugging. Reticulonodular opacities in the lingula. Findings suggest prior mycobacterium avium infection. 5. Left adrenal nodule measuring 14 mm, nonspecific. In the absence of known malignancy, this is likely benign. Aortic Atherosclerosis (ICD10-I70.0). Electronically Signed   By: MKeith RakeM.D.   On: 08/19/2020 18:43   DG Pelvis Portable  Result Date: 08/19/2020 CLINICAL DATA:  Weakness. Fall today. EXAM: PORTABLE PELVIS 1-2 VIEWS COMPARISON:  Pelvis and right hip radiograph on 07/16/2018 FINDINGS: The cortical margins of the bony pelvis are intact. No fracture. Pubic symphysis and sacroiliac joints are congruent. Bilateral acetabular spurring. Both femoral heads are well-seated in the respective acetabula. IMPRESSION: No pelvic fracture. Electronically Signed   By: MKeith RakeM.D.   On: 08/19/2020 15:26       Assessment & Plan:   Problem List Items Addressed This Visit    [place]    Followed by Dr PFlorene Glen  Counts have been stable.       Hypertension    Not  orthostatic, but blood pressure lower today - 114/60 and 118/60.  Will temporarily decrease losartan to 233mq day (instead of bid).  Will need to follow blood pressures.  Adjust back up if bp increases.  Will see if nursing can follow.  Diabetes mellitus ([place])    Denies any low blood sugars.  Decreased appetite.  Encourage increased po intake.  Follow met b and a1c.       Hypercholesterolemia    Continue crestor.       Dizziness    Has been an intermittent issue for her.  Previously saw ENT.  S/p epley maneuvers.  Discussed importance of slow position changes and movements.  No increased dizziness currently.  Continue to use walker.  Recent fall. Have PT evaluated and treat - work with unsteady gait, etc.        Relevant Orders   Ambulatory referral to [place]   Ambulatory referral to Neurology   Fall    Recent fall.  XRAYs as outlined.  Persistent increased back pain/side pain.  Weakness.  Will have PT evaluate and treat.  Continue to use walker.        Relevant Orders   Ambulatory referral to [place]   Ambulatory referral to Neurology   Diarrhea    Persistent.  Check stool for c.diff.        Relevant Orders   Clostridium Difficile by PCR([facility])   Back pain    Persistent back pain.  Limited mobility.  xrays as outlined. Home health PT.        Relevant Orders   Ambulatory referral to Home Health   Confusion    Family reported some mild change in memory, thinking, completing tasks, etc.  Answering questions today.  Recent CT - no acute changes.  Discussed neurology evaluation.  Agreeable.           I spent over 45 minutes with the patient and more than 50% of the time was spent in consultation regarding the above.  Time spent discussing recent fall, UC and ER visits and recent symptoms and concerns.  Time also spent discussing further w/up and evaluation.  Einar Pheasant, MD

## 2020-08-27 ENCOUNTER — Inpatient Hospital Stay (HOSPITAL_COMMUNITY)
Admission: EM | Admit: 2020-08-27 | Discharge: 2020-10-06 | DRG: 064 | Disposition: E | Payer: Medicare PPO | Attending: Internal Medicine | Admitting: Internal Medicine

## 2020-08-27 ENCOUNTER — Encounter: Payer: Self-pay | Admitting: Internal Medicine

## 2020-08-27 ENCOUNTER — Encounter (HOSPITAL_COMMUNITY): Payer: Self-pay | Admitting: Internal Medicine

## 2020-08-27 ENCOUNTER — Other Ambulatory Visit: Payer: Self-pay

## 2020-08-27 DIAGNOSIS — R4189 Other symptoms and signs involving cognitive functions and awareness: Secondary | ICD-10-CM | POA: Diagnosis present

## 2020-08-27 DIAGNOSIS — R109 Unspecified abdominal pain: Secondary | ICD-10-CM | POA: Diagnosis not present

## 2020-08-27 DIAGNOSIS — R112 Nausea with vomiting, unspecified: Secondary | ICD-10-CM

## 2020-08-27 DIAGNOSIS — I6330 Cerebral infarction due to thrombosis of unspecified cerebral artery: Secondary | ICD-10-CM | POA: Diagnosis not present

## 2020-08-27 DIAGNOSIS — Z818 Family history of other mental and behavioral disorders: Secondary | ICD-10-CM

## 2020-08-27 DIAGNOSIS — Z20822 Contact with and (suspected) exposure to covid-19: Secondary | ICD-10-CM | POA: Diagnosis present

## 2020-08-27 DIAGNOSIS — D649 Anemia, unspecified: Secondary | ICD-10-CM | POA: Diagnosis present

## 2020-08-27 DIAGNOSIS — R4182 Altered mental status, unspecified: Secondary | ICD-10-CM | POA: Diagnosis not present

## 2020-08-27 DIAGNOSIS — R042 Hemoptysis: Secondary | ICD-10-CM | POA: Diagnosis not present

## 2020-08-27 DIAGNOSIS — N390 Urinary tract infection, site not specified: Secondary | ICD-10-CM | POA: Diagnosis present

## 2020-08-27 DIAGNOSIS — E871 Hypo-osmolality and hyponatremia: Secondary | ICD-10-CM | POA: Diagnosis present

## 2020-08-27 DIAGNOSIS — D469 Myelodysplastic syndrome, unspecified: Secondary | ICD-10-CM | POA: Diagnosis present

## 2020-08-27 DIAGNOSIS — I6782 Cerebral ischemia: Secondary | ICD-10-CM | POA: Diagnosis not present

## 2020-08-27 DIAGNOSIS — Z431 Encounter for attention to gastrostomy: Secondary | ICD-10-CM

## 2020-08-27 DIAGNOSIS — R29705 NIHSS score 5: Secondary | ICD-10-CM | POA: Diagnosis present

## 2020-08-27 DIAGNOSIS — Z90710 Acquired absence of both cervix and uterus: Secondary | ICD-10-CM | POA: Diagnosis not present

## 2020-08-27 DIAGNOSIS — G9389 Other specified disorders of brain: Secondary | ICD-10-CM | POA: Diagnosis not present

## 2020-08-27 DIAGNOSIS — D61818 Other pancytopenia: Secondary | ICD-10-CM | POA: Diagnosis present

## 2020-08-27 DIAGNOSIS — R509 Fever, unspecified: Secondary | ICD-10-CM

## 2020-08-27 DIAGNOSIS — E1165 Type 2 diabetes mellitus with hyperglycemia: Secondary | ICD-10-CM | POA: Diagnosis not present

## 2020-08-27 DIAGNOSIS — Z833 Family history of diabetes mellitus: Secondary | ICD-10-CM

## 2020-08-27 DIAGNOSIS — I6381 Other cerebral infarction due to occlusion or stenosis of small artery: Secondary | ICD-10-CM | POA: Diagnosis present

## 2020-08-27 DIAGNOSIS — Z886 Allergy status to analgesic agent status: Secondary | ICD-10-CM | POA: Diagnosis not present

## 2020-08-27 DIAGNOSIS — R410 Disorientation, unspecified: Secondary | ICD-10-CM | POA: Insufficient documentation

## 2020-08-27 DIAGNOSIS — I639 Cerebral infarction, unspecified: Secondary | ICD-10-CM | POA: Diagnosis present

## 2020-08-27 DIAGNOSIS — E119 Type 2 diabetes mellitus without complications: Secondary | ICD-10-CM | POA: Diagnosis present

## 2020-08-27 DIAGNOSIS — G9340 Encephalopathy, unspecified: Secondary | ICD-10-CM | POA: Diagnosis not present

## 2020-08-27 DIAGNOSIS — R630 Anorexia: Secondary | ICD-10-CM | POA: Diagnosis present

## 2020-08-27 DIAGNOSIS — W19XXXA Unspecified fall, initial encounter: Secondary | ICD-10-CM | POA: Diagnosis present

## 2020-08-27 DIAGNOSIS — G9341 Metabolic encephalopathy: Secondary | ICD-10-CM | POA: Diagnosis present

## 2020-08-27 DIAGNOSIS — K5900 Constipation, unspecified: Secondary | ICD-10-CM | POA: Diagnosis not present

## 2020-08-27 DIAGNOSIS — M549 Dorsalgia, unspecified: Secondary | ICD-10-CM | POA: Insufficient documentation

## 2020-08-27 DIAGNOSIS — E041 Nontoxic single thyroid nodule: Secondary | ICD-10-CM | POA: Diagnosis not present

## 2020-08-27 DIAGNOSIS — T17908A Unspecified foreign body in respiratory tract, part unspecified causing other injury, initial encounter: Secondary | ICD-10-CM | POA: Diagnosis not present

## 2020-08-27 DIAGNOSIS — I469 Cardiac arrest, cause unspecified: Secondary | ICD-10-CM | POA: Diagnosis not present

## 2020-08-27 DIAGNOSIS — R29818 Other symptoms and signs involving the nervous system: Secondary | ICD-10-CM | POA: Diagnosis not present

## 2020-08-27 DIAGNOSIS — R140 Abdominal distension (gaseous): Secondary | ICD-10-CM | POA: Diagnosis not present

## 2020-08-27 DIAGNOSIS — E785 Hyperlipidemia, unspecified: Secondary | ICD-10-CM | POA: Diagnosis present

## 2020-08-27 DIAGNOSIS — E7800 Pure hypercholesterolemia, unspecified: Secondary | ICD-10-CM | POA: Diagnosis present

## 2020-08-27 DIAGNOSIS — K828 Other specified diseases of gallbladder: Secondary | ICD-10-CM | POA: Diagnosis present

## 2020-08-27 DIAGNOSIS — E876 Hypokalemia: Secondary | ICD-10-CM | POA: Diagnosis present

## 2020-08-27 DIAGNOSIS — Z825 Family history of asthma and other chronic lower respiratory diseases: Secondary | ICD-10-CM

## 2020-08-27 DIAGNOSIS — R0682 Tachypnea, not elsewhere classified: Secondary | ICD-10-CM | POA: Diagnosis not present

## 2020-08-27 DIAGNOSIS — R1310 Dysphagia, unspecified: Secondary | ICD-10-CM | POA: Diagnosis present

## 2020-08-27 DIAGNOSIS — R197 Diarrhea, unspecified: Secondary | ICD-10-CM | POA: Diagnosis present

## 2020-08-27 DIAGNOSIS — L8990 Pressure ulcer of unspecified site, unspecified stage: Secondary | ICD-10-CM | POA: Insufficient documentation

## 2020-08-27 DIAGNOSIS — Z7982 Long term (current) use of aspirin: Secondary | ICD-10-CM

## 2020-08-27 DIAGNOSIS — I6523 Occlusion and stenosis of bilateral carotid arteries: Secondary | ICD-10-CM | POA: Diagnosis not present

## 2020-08-27 DIAGNOSIS — K224 Dyskinesia of esophagus: Secondary | ICD-10-CM

## 2020-08-27 DIAGNOSIS — I6389 Other cerebral infarction: Secondary | ICD-10-CM | POA: Diagnosis not present

## 2020-08-27 DIAGNOSIS — Y92009 Unspecified place in unspecified non-institutional (private) residence as the place of occurrence of the external cause: Secondary | ICD-10-CM

## 2020-08-27 DIAGNOSIS — I10 Essential (primary) hypertension: Secondary | ICD-10-CM | POA: Diagnosis present

## 2020-08-27 DIAGNOSIS — Z8249 Family history of ischemic heart disease and other diseases of the circulatory system: Secondary | ICD-10-CM | POA: Diagnosis not present

## 2020-08-27 DIAGNOSIS — Z808 Family history of malignant neoplasm of other organs or systems: Secondary | ICD-10-CM

## 2020-08-27 DIAGNOSIS — Z90722 Acquired absence of ovaries, bilateral: Secondary | ICD-10-CM

## 2020-08-27 DIAGNOSIS — I771 Stricture of artery: Secondary | ICD-10-CM | POA: Diagnosis not present

## 2020-08-27 DIAGNOSIS — D46C Myelodysplastic syndrome with isolated del(5q) chromosomal abnormality: Secondary | ICD-10-CM | POA: Diagnosis present

## 2020-08-27 DIAGNOSIS — I361 Nonrheumatic tricuspid (valve) insufficiency: Secondary | ICD-10-CM | POA: Diagnosis not present

## 2020-08-27 DIAGNOSIS — Z79899 Other long term (current) drug therapy: Secondary | ICD-10-CM

## 2020-08-27 DIAGNOSIS — Z7984 Long term (current) use of oral hypoglycemic drugs: Secondary | ICD-10-CM

## 2020-08-27 DIAGNOSIS — Z7902 Long term (current) use of antithrombotics/antiplatelets: Secondary | ICD-10-CM

## 2020-08-27 DIAGNOSIS — I749 Embolism and thrombosis of unspecified artery: Secondary | ICD-10-CM

## 2020-08-27 DIAGNOSIS — H6121 Impacted cerumen, right ear: Secondary | ICD-10-CM | POA: Diagnosis present

## 2020-08-27 LAB — COMPREHENSIVE METABOLIC PANEL
ALT: 15 U/L (ref 0–44)
AST: 11 U/L — ABNORMAL LOW (ref 15–41)
Albumin: 3.8 g/dL (ref 3.5–5.0)
Alkaline Phosphatase: 58 U/L (ref 38–126)
Anion gap: 12 (ref 5–15)
BUN: 13 mg/dL (ref 8–23)
CO2: 24 mmol/L (ref 22–32)
Calcium: 9.7 mg/dL (ref 8.9–10.3)
Chloride: 96 mmol/L — ABNORMAL LOW (ref 98–111)
Creatinine, Ser: 0.95 mg/dL (ref 0.44–1.00)
GFR, Estimated: 60 mL/min — ABNORMAL LOW (ref >60)
Glucose, Bld: 161 mg/dL — ABNORMAL HIGH (ref 70–99)
Potassium: 3.5 mmol/L (ref 3.5–5.1)
Sodium: 132 mmol/L — ABNORMAL LOW (ref 135–145)
Total Bilirubin: 0.8 mg/dL (ref 0.3–1.2)
Total Protein: 6.8 g/dL (ref 6.5–8.1)

## 2020-08-27 LAB — URINALYSIS, ROUTINE W REFLEX MICROSCOPIC
Bilirubin Urine: NEGATIVE
Glucose, UA: 50 mg/dL — AB
Ketones, ur: 5 mg/dL — AB
Nitrite: NEGATIVE
Protein, ur: NEGATIVE mg/dL
RBC / HPF: >50 RBC/hpf — ABNORMAL HIGH (ref 0–5)
Specific Gravity, Urine: 1.016 (ref 1.005–1.030)
Squamous Epithelial / HPF: >50 — ABNORMAL HIGH (ref 0–5)
WBC, UA: >50 WBC/hpf — ABNORMAL HIGH (ref 0–5)
pH: 5 (ref 5.0–8.0)

## 2020-08-27 LAB — CBG MONITORING, ED
Glucose-Capillary: 154 mg/dL — ABNORMAL HIGH (ref 70–99)
Glucose-Capillary: 154 mg/dL — ABNORMAL HIGH (ref 70–99)

## 2020-08-27 LAB — CBC
HCT: 34.5 % — ABNORMAL LOW (ref 36.0–46.0)
Hemoglobin: 11.2 g/dL — ABNORMAL LOW (ref 12.0–15.0)
MCH: 29.1 pg (ref 26.0–34.0)
MCHC: 32.5 g/dL (ref 30.0–36.0)
MCV: 89.6 fL (ref 80.0–100.0)
Platelets: 141 10*3/uL — ABNORMAL LOW (ref 150–400)
RBC: 3.85 MIL/uL — ABNORMAL LOW (ref 3.87–5.11)
RDW: 15.9 % — ABNORMAL HIGH (ref 11.5–15.5)
WBC: 3.4 10*3/uL — ABNORMAL LOW (ref 4.0–10.5)
nRBC: 0 % (ref 0.0–0.2)

## 2020-08-27 LAB — RESP PANEL BY RT-PCR (FLU A&B, COVID) ARPGX2
Influenza A by PCR: NEGATIVE
Influenza B by PCR: NEGATIVE
SARS Coronavirus 2 by RT PCR: NEGATIVE

## 2020-08-27 LAB — TROPONIN I (HIGH SENSITIVITY): Troponin I (High Sensitivity): 8 ng/L (ref <18)

## 2020-08-27 MED ORDER — ROSUVASTATIN CALCIUM 5 MG PO TABS
10.0000 mg | ORAL_TABLET | Freq: Every day | ORAL | Status: DC
  Administered 2020-08-28 – 2020-09-04 (×8): 10 mg via ORAL
  Filled 2020-08-27 (×8): qty 2

## 2020-08-27 MED ORDER — LENALIDOMIDE 10 MG PO CAPS
10.0000 mg | ORAL_CAPSULE | Freq: Every day | ORAL | Status: DC
  Administered 2020-09-02 – 2020-09-06 (×3): 10 mg via ORAL
  Filled 2020-08-27 (×6): qty 1

## 2020-08-27 MED ORDER — VITAMIN B-12 1000 MCG PO TABS
1000.0000 ug | ORAL_TABLET | Freq: Every day | ORAL | Status: DC
  Administered 2020-08-28 – 2020-09-10 (×10): 1000 ug via ORAL
  Filled 2020-08-27 (×11): qty 1

## 2020-08-27 MED ORDER — LOSARTAN POTASSIUM 50 MG PO TABS: 25.0000 mg | ORAL_TABLET | Freq: Every day | ORAL | Status: DC

## 2020-08-27 MED ORDER — ASPIRIN 81 MG PO CHEW
81.0000 mg | CHEWABLE_TABLET | Freq: Every day | ORAL | Status: DC
  Administered 2020-08-28 – 2020-09-04 (×8): 81 mg via ORAL
  Filled 2020-08-27 (×10): qty 1

## 2020-08-27 MED ORDER — INSULIN ASPART 100 UNIT/ML ~~LOC~~ SOLN
0.0000 [IU] | Freq: Three times a day (TID) | SUBCUTANEOUS | Status: DC
  Administered 2020-08-28 – 2020-08-29 (×3): 1 [IU] via SUBCUTANEOUS
  Administered 2020-08-29: 12:00:00 2 [IU] via SUBCUTANEOUS
  Administered 2020-08-29 – 2020-08-30 (×2): 1 [IU] via SUBCUTANEOUS
  Administered 2020-08-30: 18:00:00 2 [IU] via SUBCUTANEOUS
  Administered 2020-08-30: 13:00:00 1 [IU] via SUBCUTANEOUS
  Administered 2020-08-31: 16:00:00 2 [IU] via SUBCUTANEOUS
  Administered 2020-08-31: 12:00:00 1 [IU] via SUBCUTANEOUS
  Administered 2020-08-31: 09:00:00 2 [IU] via SUBCUTANEOUS
  Administered 2020-09-01 (×2): 1 [IU] via SUBCUTANEOUS
  Administered 2020-09-01 – 2020-09-02 (×2): 2 [IU] via SUBCUTANEOUS
  Administered 2020-09-02: 13:00:00 5 [IU] via SUBCUTANEOUS
  Administered 2020-09-03 (×2): 2 [IU] via SUBCUTANEOUS
  Administered 2020-09-03: 08:00:00 1 [IU] via SUBCUTANEOUS
  Administered 2020-09-04: 3 [IU] via SUBCUTANEOUS
  Administered 2020-09-04 – 2020-09-07 (×5): 2 [IU] via SUBCUTANEOUS
  Administered 2020-09-07 (×2): 1 [IU] via SUBCUTANEOUS
  Administered 2020-09-08 (×2): 2 [IU] via SUBCUTANEOUS
  Administered 2020-09-08 – 2020-09-09 (×2): 1 [IU] via SUBCUTANEOUS
  Administered 2020-09-09 (×2): 2 [IU] via SUBCUTANEOUS
  Administered 2020-09-10: 3 [IU] via SUBCUTANEOUS

## 2020-08-27 MED ORDER — MIDAZOLAM HCL 2 MG/2ML IJ SOLN
2.0000 mg | Freq: Once | INTRAMUSCULAR | Status: AC | PRN
  Administered 2020-08-28: 2 mg via INTRAVENOUS
  Filled 2020-08-27: qty 2

## 2020-08-27 NOTE — Assessment & Plan Note (Signed)
Has been an intermittent issue for her.  Previously saw ENT.  S/p epley maneuvers.  Discussed importance of slow position changes and movements.  No increased dizziness currently.  Continue to use walker.  Recent fall. Have PT evaluated and treat - work with unsteady gait, etc.

## 2020-08-27 NOTE — Assessment & Plan Note (Signed)
Persistent back pain.  Limited mobility.  xrays as outlined. Home health PT.

## 2020-08-27 NOTE — ED Notes (Signed)
Per son patient baseline is A0x4-- upon arrival A0x2.   Able to follow simple commands, patient states it its 1962 but able to tell what month it is.  Patient denies any pain, chronic BLE +2 edema.   Patient has been seen for same issue at other facilities as well as PCP.

## 2020-08-27 NOTE — H&P (Addendum)
History and Physical    Kara Griffin HUT:[id number] DOB: [birth year] DOA: 08/07/2020  PCP: Einar Pheasant, MD  Patient coming from: Home.  History obtained from patient's son.  Chief Complaint: Confusion.    HPI: Kara Griffin is a 84 y.o. female with history of myelodysplastic syndrome being followed by Dr. Florene Glen at [facility] on [place], pancytopenia, diabetes mellitus, hypertension and hyperlipidemia was brought to the ER by patient's son after patient was found to be increasingly confused last 2 days.  Patient son states that since Thanksgiving almost a month and a half ago patient has become gradually weak and not herself.  Recently her blood pressure was found to be low and her primary care physician decreased her antihypertensives during the recent visit 2 days ago.  Patient also has chronic dizziness.  On August 19, 2020 about 8 days ago patient was taken to [facility] after patient had a fall at home.  Over the CT head C-spine and abdomen was done which showed features concerning for right-sided mucous plugging and MAI features and was given Z-Pak.  Following which patient had some couple of episodes of diarrhea.  Has not recorded any fever at home.  Has been having poor appetite with some nausea.  CT scan also at abdomen showed some gallbladder distention with no definite signs of any inflammation.  ED Course: In the ER patient is generally weak oriented to name and place.  Moving all extremities.  At the time of my exam was afebrile not hypoxic Covid test was negative CBC shows pancytopenia at baseline high-sensitivity and was 8 and blood glucose of 161 sodium 132.  LFTs were normal.  MRI brain has been ordered and admitted for acute encephalopathy with generalized weakness and poor appetite.  Review of Systems: As per HPI, rest all negative.   Past Medical History:  Diagnosis Date  . Anemia   . Diabetes mellitus ([place])   . Environmental allergies   .  Hypercholesterolemia   . Hypertension   . Myelodysplastic syndrome ([place])    Sees Dr Jerrye Noble    Past Surgical History:  Procedure Laterality Date  . ABDOMINAL HYSTERECTOMY    . [place]  . DILATION AND CURETTAGE OF UTERUS    . TONSILECTOMY, ADENOIDECTOMY, BILATERAL MYRINGOTOMY AND TUBES  1963  . TOTAL ABDOMINAL HYSTERECTOMY W/ BILATERAL SALPINGOOPHORECTOMY  1985   secondary to abnormal cells and abnormal uterine bleeding     reports that she has never smoked. She has never used smokeless tobacco. She reports that she does not drink alcohol and does not use drugs.  Allergies  Allergen Reactions  . Ibuprofen     Lowers WBCs    Family History  Problem Relation Age of Onset  . Heart disease Father        Deceased (MI) - 14  . Diabetes Mother        Deceased  . Thyroid cancer Mother   . Osteoarthritis Mother   . Asthma Other   . Diabetes Brother   . Heart disease Maternal Grandmother        myocardial infarction - 41  . Asthma Maternal Grandmother   . Heart disease Paternal Grandmother        myocardial infarction-52  . Heart disease Brother   . Sinusitis Brother   . Depression Son   . Diabetes Son   . Depression Son   . Diabetes Son   . Depression Son   . Breast cancer Neg Hx  Prior to Admission medications   Medication Sig Start Date End Date Taking? Authorizing Provider  ACCU-CHEK GUIDE test strip USE TO CHECK BLOOD SUGARS TWICE DAILY 06/30/20  Yes Einar Pheasant, MD  Accu-Chek Softclix Lancets lancets CHECK BLOOD SUGAR TWICE DAILY 12/30/19  Yes Einar Pheasant, MD  aspirin 81 MG tablet Take 81 mg by mouth daily.   Yes [provider]  azelastine (ASTELIN) 0.1 % nasal spray PLACE 1 SPRAY INTO BOTH NOSTRILS TWICE DAILY Patient taking differently: Place 1 spray into both nostrils 2 (two) times daily. 05/18/20  Yes Einar Pheasant, MD  blood glucose meter kit and supplies KIT Dispense based on patient and insurance preference. Use to  check sugars twice daily. DX E11.9 09/27/19  Yes Einar Pheasant, MD  Calcium Carbonate-Vitamin D 600-400 MG-UNIT tablet Take 1 tablet by mouth at bedtime. Reported on 01/15/2016   Yes [provider]  cyanocobalamin 500 MCG tablet Take 1,000 mcg by mouth daily.    Yes [provider]  fluticasone (FLONASE) 50 MCG/ACT nasal spray Place 2 sprays into both nostrils daily.   Yes [provider]  lenalidomide (REVLIMID) 10 MG capsule Take 10 mg by mouth daily.   Yes [provider]  losartan (COZAAR) 25 MG tablet TAKE 1 TABLET(25 MG) BY MOUTH TWICE DAILY Patient taking differently: Take 25 mg by mouth at bedtime. 05/21/20  Yes Einar Pheasant, MD  metFORMIN (GLUCOPHAGE) 1000 MG tablet TAKE 1 TABLET(1000 MG) BY MOUTH TWICE DAILY Patient taking differently: Take 1,000 mg by mouth 2 (two) times daily with a meal. 02/21/20  Yes Einar Pheasant, MD  Multiple Vitamin (MULTIVITAMIN) tablet Take 1 tablet by mouth daily.   Yes [provider]  Multiple Vitamins-Minerals (PRESERVISION AREDS 2+MULTI VIT) CAPS Take 1 capsule by mouth 2 (two) times daily.   Yes [provider]  rosuvastatin (CRESTOR) 10 MG tablet TAKE 1 TABLET(10 MG) BY MOUTH DAILY Patient taking differently: Take 10 mg by mouth daily. 02/21/20  Yes Einar Pheasant, MD  sitaGLIPtin (JANUVIA) 100 MG tablet TAKE 1 TABLET BY MOUTH DAILY Patient taking differently: Take 100 mg by mouth daily. 05/21/20  Yes Einar Pheasant, MD    Physical Exam: Constitutional: Moderately built and nourished. Vitals:   08/17/2020 1810 08/12/2020 2045 08/21/2020 2145  BP: (!) 158/67 134/71 (!) 143/58  Pulse: 88 87 79  Resp: 18 16 17   Temp: 98.2 F (36.8 C)    TempSrc: Oral    SpO2: 98% 100% 100%   Eyes: Anicteric no pallor. ENMT: No discharge from the ears eyes nose or mouth. Neck: No mass felt.  No neck rigidity. Respiratory: No rhonchi or crepitations. Cardiovascular: S1-S2 heard. Abdomen: Soft nontender bowel  sounds present. Musculoskeletal: No edema.  Does complain of some pain in the ankle. Skin: No rash. Neurologic: Alert awake oriented to name and place moving all extremities 5 x 5.  No facial asymmetry. Psychiatric: Oriented to name and place.   Labs on Admission: I have personally reviewed following labs and imaging studies  CBC: Recent Labs  Lab 08/10/2020 1814  WBC 3.4*  HGB 11.2*  HCT 34.5*  MCV 89.6  PLT 720*   Basic Metabolic Panel: Recent Labs  Lab 08/16/2020 1814  NA 132*  K 3.5  CL 96*  CO2 24  GLUCOSE 161*  BUN 13  CREATININE 0.95  CALCIUM 9.7   GFR: Estimated Creatinine Clearance: 41.4 mL/min (by C-G formula based on SCr of 0.95 mg/dL). Liver Function Tests: Recent Labs  Lab 08/17/2020 1814  AST 11*  ALT 15  ALKPHOS 58  BILITOT 0.8  PROT 6.8  ALBUMIN 3.8   No results for input(s): LIPASE, AMYLASE in the last 168 hours. No results for input(s): AMMONIA in the last 168 hours. Coagulation Profile: No results for input(s): INR, PROTIME in the last 168 hours. Cardiac Enzymes: No results for input(s): CKTOTAL, CKMB, CKMBINDEX, TROPONINI in the last 168 hours. BNP (last 3 results) No results for input(s): PROBNP in the last 8760 hours. HbA1C: No results for input(s): HGBA1C in the last 72 hours. CBG: Recent Labs  Lab 08/22/2020 1817 08/17/2020 2047  GLUCAP 154* 154*   Lipid Profile: No results for input(s): CHOL, HDL, LDLCALC, TRIG, CHOLHDL, LDLDIRECT in the last 72 hours. Thyroid Function Tests: No results for input(s): TSH, T4TOTAL, FREET4, T3FREE, THYROIDAB in the last 72 hours. Anemia Panel: No results for input(s): VITAMINB12, FOLATE, FERRITIN, TIBC, IRON, RETICCTPCT in the last 72 hours. Urine analysis:    Component Value Date/Time   COLORURINE AMBER (A) 08/14/2020 1810   APPEARANCEUR TURBID (A) 08/07/2020 1810   LABSPEC 1.016 08/20/2020 1810   PHURINE 5.0 08/19/2020 1810   GLUCOSEU 50 (A) 08/13/2020 1810   HGBUR SMALL (A) 08/28/2020 1810    BILIRUBINUR NEGATIVE 08/20/2020 1810   KETONESUR 5 (A) 08/14/2020 1810   PROTEINUR NEGATIVE 08/10/2020 1810   NITRITE NEGATIVE 08/15/2020 1810   LEUKOCYTESUR SMALL (A) 08/11/2020 1810   Sepsis Labs: @LABRCNTIP (procalcitonin:4,lacticidven:4) ) Recent Results (from the past 240 hour(s))  Urine culture     Status: Abnormal   Collection Time: 08/19/20  3:59 PM   Specimen: Urine, Random  Result Value Ref Range Status   Specimen Description   Final    URINE, RANDOM Performed at [facility], [address] [place], [place] [address]    Special Requests   Final    NONE Performed at [facility], [address] [place], [place] [address]    Culture MULTIPLE SPECIES PRESENT, SUGGEST RECOLLECTION (A)  Final   Report Status 08/20/2020 FINAL  Final  Resp Panel by RT-PCR (Flu A&B, Covid) Nasopharyngeal Swab     Status: None   Collection Time: 08/07/2020 10:15 PM   Specimen: Nasopharyngeal Swab; Nasopharyngeal(NP) swabs in vial transport medium  Result Value Ref Range Status   SARS Coronavirus 2 by RT PCR NEGATIVE NEGATIVE Final    Comment: (NOTE) SARS-CoV-2 target nucleic acids are NOT DETECTED.  The SARS-CoV-2 RNA is generally detectable in upper respiratory specimens during the acute phase of infection. The lowest concentration of SARS-CoV-2 viral copies this assay can detect is 138 copies/mL. A negative result does not preclude SARS-Cov-2 infection and should not be used as the sole basis for treatment or other patient management decisions. A negative result may occur with  improper specimen collection/handling, submission of specimen other than nasopharyngeal swab, presence of viral mutation(s) within the areas targeted by this assay, and inadequate number of viral copies(<138 copies/mL). A negative result must be combined with clinical observations, patient history, and epidemiological information. The expected result is Negative.  Fact Sheet for Patients:   EntrepreneurPulse.com.au  Fact Sheet for Healthcare Providers:  IncredibleEmployment.be  This test is no t yet approved or cleared by the [place] FDA and  has been authorized for detection and/or diagnosis of SARS-CoV-2 by FDA under an Emergency Use Authorization (EUA). This EUA will remain  in effect (meaning this test can be used) for the duration of the COVID-19 declaration under Section 564(b)(1) of the Act, 21 U.S.C.section 360bbb-3(b)(1),  unless the authorization is terminated  or revoked sooner.       Influenza A by PCR NEGATIVE NEGATIVE Final   Influenza B by PCR NEGATIVE NEGATIVE Final    Comment: (NOTE) The Xpert Xpress SARS-CoV-2/FLU/RSV plus assay is intended as an aid in the diagnosis of influenza from Nasopharyngeal swab specimens and should not be used as a sole basis for treatment. Nasal washings and aspirates are unacceptable for Xpert Xpress SARS-CoV-2/FLU/RSV testing.  Fact Sheet for Patients: EntrepreneurPulse.com.au  Fact Sheet for Healthcare Providers: IncredibleEmployment.be  This test is not yet approved or cleared by the [place] FDA and has been authorized for detection and/or diagnosis of SARS-CoV-2 by FDA under an Emergency Use Authorization (EUA). This EUA will remain in effect (meaning this test can be used) for the duration of the COVID-19 declaration under Section 564(b)(1) of the Act, 21 U.S.C. section 360bbb-3(b)(1), unless the authorization is terminated or revoked.  Performed at [facility], [place] [address], [place], [place] [address]      Radiological Exams on Admission: No results found.  EKG: Independently reviewed.  Normal sinus rhythm posterior AV block.  Assessment/Plan Principal Problem:   Acute encephalopathy Active Problems:   Myelodysplastic syndrome ([place])   Diabetes mellitus ([place])   Pancytopenia ([place])    1. Acute  encephalopathy cause not clear.  Will check MRI brain, TSH B12 and ammonia levels. 2. Diabetes mellitus type 2 we will keep patient on current scale coverage. 3. Hypertension patient's antihypertensive ARB dose was decreased by the primary care physician recently.  Follow blood pressure trends. 4. Myelodysplastic syndrome and pancytopenia being followed by oncologist Dr. Florene Glen on development. 5. Abnormal CT scan done recently at [facility] with distended gallbladder and patient having poor appetite will check sonogram of the abdomen follow LFTs.  Abdomen appears benign on exam. 6. Diarrhea -patient has had couple of episodes of diarrhea and may be related to recent use of antibiotics.  If persist will need stool studies.  Addendum -patient MRI brain showed acute right basal ganglia lacunar infarct.  Neurologist was consulted.  Patient did fail swallow evaluation for which we will get speech therapy evaluation.  Patient also started spiking fever with temperature of 100.5 after admission.  We will get blood cultures urine cultures chest x-ray and may start empiric antibiotics based on the test ordered.  Hold antihypertensives allow for permissive hypertension neurochecks check MRI brain 2D echo carotid Doppler.  We will get a CT abdomen without contrast since on exam patient had mild tenderness with diarrhea and now developing fever.   DVT prophylaxis: SCDs. Code Status: Full code. Family Communication: Patient's son. Disposition Plan: Home when stable. Consults called: Neurologist. Admission status: Observation.   Rise Patience MD [facility] Pager [phone].  If 7PM-7AM, please contact night-coverage [email or web address] Password [facility]  08/15/2020, 11:56 PM

## 2020-08-27 NOTE — Assessment & Plan Note (Signed)
Recent fall.  XRAYs as outlined.  Persistent increased back pain/side pain.  Weakness.  Will have PT evaluate and treat.  Continue to use walker.

## 2020-08-27 NOTE — Telephone Encounter (Signed)
LM for son to call back

## 2020-08-27 NOTE — Assessment & Plan Note (Signed)
Persistent.  Check stool for c.diff.

## 2020-08-27 NOTE — Assessment & Plan Note (Signed)
Continue crestor 

## 2020-08-27 NOTE — Assessment & Plan Note (Signed)
Family reported some mild change in memory, thinking, completing tasks, etc.  Answering questions today.  Recent CT - no acute changes.  Discussed neurology evaluation.  Agreeable.

## 2020-08-27 NOTE — ED Triage Notes (Signed)
Pt presents to ED POV. Information provided by son, pt AMS, son reports possible UTI. Per son pt has experienced AVH x3w

## 2020-08-27 NOTE — Assessment & Plan Note (Signed)
Denies any low blood sugars.  Decreased appetite.  Encourage increased po intake.  Follow met b and a1c.

## 2020-08-27 NOTE — ED Provider Notes (Signed)
[facility] EMERGENCY DEPARTMENT Provider Note   CSN: [id number] Arrival date & time: 08/08/2020  1758     History Chief Complaint  Patient presents with  . Altered Mental Status    Kara Griffin is a 84 y.o. female with a history of diabetes, hypertension, myelodysplastic syndrome, presenting to emergency department family concern for confusion and general fatigue.  The patient has had 3 ER visits since the start of the month with complaints of dizziness and falls, most recently on December 15, at which time she fell and was having back pain.  CT scan of the head and C-spine were negative at that time.  She was discharged home with plans for home physical therapy.  However she returns today with her son at the bedside expressing concerns of not been able to set this up yet, that the patient's been progressively weaker and spending most of the day in bed for the past 24 hours.  They are unable to get her out of bed.  He is concerned that she may be confused and having auditory and possible visual hallucinations.  Her son is adamant that the patient's mental status began to change approximately 1 to 1-1/2 months ago, and prior to that she had no history of dementia.  She lives by herself but her family members have been stopping by to take care of her and visit her in the daytime.  She has received both dose of the Covid vaccine as well as the booster.    HPI     Past Medical History:  Diagnosis Date  . Anemia   . Diabetes mellitus ([place])   . Environmental allergies   . Hypercholesterolemia   . Hypertension   . Myelodysplastic syndrome ([place])    Sees Dr Jerrye Noble    Patient Active Problem List   Diagnosis Date Noted  . Back pain 09/01/2020  . Confusion 08/11/2020  . Acute encephalopathy 09/01/2020  . Pancytopenia ([place]) 08/14/2020  . Diarrhea 08/25/2020  . Skin lesion 06/21/2020  . Pain of right thumb 05/25/2017  . Fall 02/14/2017  . Neuropathy  05/17/2016  . Left hip pain 01/15/2016  . Health care maintenance 01/19/2015  . Dizziness 01/16/2015  . BMI 33.0-33.9,adult 09/14/2014  . Hypercholesterolemia 08/13/2012  . Thyroid nodule 08/13/2012  . Osteopenia 08/13/2012  . Myelodysplastic syndrome ([place]) 06/04/2012  . Hypertension 06/04/2012  . Diabetes mellitus ([place]) 06/04/2012  . Environmental allergies 06/04/2012    Past Surgical History:  Procedure Laterality Date  . ABDOMINAL HYSTERECTOMY    . [place]  . DILATION AND CURETTAGE OF UTERUS    . TONSILECTOMY, ADENOIDECTOMY, BILATERAL MYRINGOTOMY AND TUBES  1963  . TOTAL ABDOMINAL HYSTERECTOMY W/ BILATERAL SALPINGOOPHORECTOMY  1985   secondary to abnormal cells and abnormal uterine bleeding     OB History   No obstetric history on file.     Family History  Problem Relation Age of Onset  . Heart disease Father        Deceased ([place]) - 72  . Diabetes Mother        Deceased  . Thyroid cancer Mother   . Osteoarthritis Mother   . Asthma Other   . Diabetes Brother   . Heart disease Maternal Grandmother        myocardial infarction - 35  . Asthma Maternal Grandmother   . Heart disease Paternal Grandmother        myocardial infarction-52  . Heart disease Brother   .  Sinusitis Brother   . Depression Son   . Diabetes Son   . Depression Son   . Diabetes Son   . Depression Son   . Breast cancer Neg Hx     Social History   Tobacco Use  . Smoking status: Never Smoker  . Smokeless tobacco: Never Used  Vaping Use  . Vaping Use: Never used  Substance Use Topics  . Alcohol use: No    Alcohol/week: 0.0 standard drinks  . Drug use: No    Home Medications Prior to Admission medications   Medication Sig Start Date End Date Taking? Authorizing Provider  ACCU-CHEK GUIDE test strip USE TO CHECK BLOOD SUGARS TWICE DAILY 06/30/20  Yes Einar Pheasant, MD  Accu-Chek Softclix Lancets lancets CHECK BLOOD SUGAR TWICE DAILY 12/30/19  Yes Einar Pheasant, MD   aspirin 81 MG tablet Take 81 mg by mouth daily.   Yes [provider]  azelastine (ASTELIN) 0.1 % nasal spray PLACE 1 SPRAY INTO BOTH NOSTRILS TWICE DAILY Patient taking differently: Place 1 spray into both nostrils 2 (two) times daily. 05/18/20  Yes Einar Pheasant, MD  blood glucose meter kit and supplies KIT Dispense based on patient and insurance preference. Use to check sugars twice daily. DX E11.9 09/27/19  Yes Einar Pheasant, MD  Calcium Carbonate-Vitamin D 600-400 MG-UNIT tablet Take 1 tablet by mouth at bedtime. Reported on 01/15/2016   Yes [provider]  cyanocobalamin 500 MCG tablet Take 1,000 mcg by mouth daily.    Yes [provider]  fluticasone (FLONASE) 50 MCG/ACT nasal spray Place 2 sprays into both nostrils daily.   Yes [provider]  lenalidomide (REVLIMID) 10 MG capsule Take 10 mg by mouth daily.   Yes [provider]  losartan (COZAAR) 25 MG tablet TAKE 1 TABLET(25 MG) BY MOUTH TWICE DAILY Patient taking differently: Take 25 mg by mouth at bedtime. 05/21/20  Yes Einar Pheasant, MD  metFORMIN (GLUCOPHAGE) 1000 MG tablet TAKE 1 TABLET(1000 MG) BY MOUTH TWICE DAILY Patient taking differently: Take 1,000 mg by mouth 2 (two) times daily with a meal. 02/21/20  Yes Einar Pheasant, MD  Multiple Vitamin (MULTIVITAMIN) tablet Take 1 tablet by mouth daily.   Yes [provider]  Multiple Vitamins-Minerals (PRESERVISION AREDS 2+MULTI VIT) CAPS Take 1 capsule by mouth 2 (two) times daily.   Yes [provider]  rosuvastatin (CRESTOR) 10 MG tablet TAKE 1 TABLET(10 MG) BY MOUTH DAILY Patient taking differently: Take 10 mg by mouth daily. 02/21/20  Yes Einar Pheasant, MD  sitaGLIPtin (JANUVIA) 100 MG tablet TAKE 1 TABLET BY MOUTH DAILY Patient taking differently: Take 100 mg by mouth daily. 05/21/20  Yes Einar Pheasant, MD    Allergies    Ibuprofen  Review of Systems   Review of Systems  Unable to perform ROS: Mental  status change (level 5 caveat)    Physical Exam Updated Vital Signs BP (!) 143/58   Pulse 79   Temp 98.2 F (36.8 C) (Oral)   Resp 17   LMP 08/13/1984   SpO2 100%   Physical Exam Vitals and nursing note reviewed.  Constitutional:      General: She is not in acute distress.    Appearance: She is well-developed and well-nourished.     Comments: Occasional nonsensical statement  HENT:     Head: Normocephalic and atraumatic.  Eyes:     Conjunctiva/sclera: Conjunctivae normal.  Cardiovascular:     Rate and Rhythm: Normal rate and regular rhythm.  Pulses: Normal pulses.  Pulmonary:     Effort: Pulmonary effort is normal. No respiratory distress.  Abdominal:     Palpations: Abdomen is soft.     Tenderness: There is no abdominal tenderness.  Musculoskeletal:        General: No edema.     Cervical back: Neck supple.  Skin:    General: Skin is warm and dry.  Neurological:     Mental Status: She is alert.     GCS: GCS eye subscore is 4. GCS verbal subscore is 5. GCS motor subscore is 6.     Cranial Nerves: No dysarthria or facial asymmetry.     Sensory: Sensation is intact.     Comments: Very poor effort to move lower extremities, unable to raise either leg off bed to command, but later noted to be ambulating to the bathroom AAO x 2  Psychiatric:        Mood and Affect: Mood and affect normal.     ED Results / Procedures / Treatments   Labs (all labs ordered are listed, but only abnormal results are displayed) Labs Reviewed  COMPREHENSIVE METABOLIC PANEL - Abnormal; Notable for the following components:      Result Value   Sodium 132 (*)    Chloride 96 (*)    Glucose, Bld 161 (*)    AST 11 (*)    GFR, Estimated 60 (*)    All other components within normal limits  CBC - Abnormal; Notable for the following components:   WBC 3.4 (*)    RBC 3.85 (*)    Hemoglobin 11.2 (*)    HCT 34.5 (*)    RDW 15.9 (*)    Platelets 141 (*)    All other components within normal  limits  URINALYSIS, ROUTINE W REFLEX MICROSCOPIC - Abnormal; Notable for the following components:   Color, Urine AMBER (*)    APPearance TURBID (*)    Glucose, UA 50 (*)    Hgb urine dipstick SMALL (*)    Ketones, ur 5 (*)    Leukocytes,Ua SMALL (*)    RBC / HPF >50 (*)    WBC, UA >50 (*)    Bacteria, UA MANY (*)    Squamous Epithelial / LPF >50 (*)    All other components within normal limits  CBG MONITORING, ED - Abnormal; Notable for the following components:   Glucose-Capillary 154 (*)    All other components within normal limits  CBG MONITORING, ED - Abnormal; Notable for the following components:   Glucose-Capillary 154 (*)    All other components within normal limits  RESP PANEL BY RT-PCR (FLU A&B, COVID) ARPGX2  TSH  AMMONIA  RPR  VITAMIN B12  TROPONIN I (HIGH SENSITIVITY)    EKG EKG Interpretation  Date/Time:  Thursday August 27 2020 18:01:56 EST Ventricular Rate:  84 PR Interval:  210 QRS Duration: 70 QT Interval:  352 QTC Calculation: 415 R Axis:   16 Text Interpretation: Sinus rhythm with 1st degree A-V block with Premature atrial complexes Cannot rule out Anterior infarct , age undetermined Abnormal ECG No STEMI Confirmed by Octaviano Glow [phone]) on 08/28/2020 10:00:58 PM   Radiology No results found.  Procedures Procedures (including critical care time)  Medications Ordered in ED Medications  aspirin chewable tablet 81 mg (has no administration in time range)  losartan (COZAAR) tablet 25 mg (has no administration in time range)  rosuvastatin (CRESTOR) tablet 10 mg (has no administration in time range)  vitamin  B-12 (CYANOCOBALAMIN) tablet 1,000 mcg (has no administration in time range)  lenalidomide (REVLIMID) capsule 10 mg (has no administration in time range)  insulin aspart (novoLOG) injection 0-9 Units (has no administration in time range)  midazolam (VERSED) injection 2 mg (2 mg Intravenous Given 08/28/20 0014)    ED Course  I have  reviewed the triage vital signs and the nursing notes.  Pertinent labs & imaging results that were available during my care of the patient were reviewed by me and considered in my medical decision making (see chart for details).  This patient presents with family concern for worsening confusion, fatigue, weakness over past 4 weeks.  This involves an extensive number of treatment options, and is a complaint that carries with it a high risk of complications and morbidity.  The differential diagnosis includes infection vs ACS vs metabolic encephalopathy vs other  CTH last week for similar complaints was negative.  I don't think she needs a repeat head CT, but would consider an MRI at this time.  She has had vertigo issues in the past which were felt to be peripheral, but MR imaging would help evaluate for subacute stroke.  UA sample here appears contaminated.  Small leuks, neg nitrites.  Labs with trop 8, flu/covid negative, glucose wnl, WBC 3.4, Hgb 11.2, CMP unremarkable.  No significant anemia, hypoglycemia.  No clear nidus of infection.  No headache or meningismus or photophobia to suggest meningitis at this time.  Additional history was obtained from son at bedside  Previous records obtained and reviewed showing prior ED workups this month  Following initial workup I admitted the patient to the hospital for encephalopathy/AMS workup, as well as PT evaluation.  Patient and son in agreement with this plan.   Clinical Course as of 08/28/20 0113  Thu Aug 27, 2020  2254 Admitted to hospitalist [MT]    Clinical Course User Index [MT] Frederick Klinger, Carola Rhine, MD    Final Clinical Impression(s) / ED Diagnoses Final diagnoses:  Altered mental status, unspecified altered mental status type    Rx / DC Orders ED Discharge Orders    None       Clementina Mareno, Carola Rhine, MD 08/28/20 [phone]

## 2020-08-27 NOTE — Telephone Encounter (Signed)
Patient's son called office to make sure Dr. Nicki Reaper saw his the MyChart message sent to her.

## 2020-08-27 NOTE — Assessment & Plan Note (Signed)
Followed by Dr Powell.  Counts have been stable.   

## 2020-08-27 NOTE — Assessment & Plan Note (Signed)
Not orthostatic, but blood pressure lower today - 114/60 and 118/60.  Will temporarily decrease losartan to 25mg  q day (instead of bid).  Will need to follow blood pressures.  Adjust back up if bp increases.  Will see if nursing can follow.

## 2020-08-27 NOTE — ED Notes (Signed)
Handoff given Curator

## 2020-08-28 ENCOUNTER — Observation Stay (HOSPITAL_COMMUNITY): Payer: Medicare PPO

## 2020-08-28 ENCOUNTER — Inpatient Hospital Stay (HOSPITAL_COMMUNITY): Payer: Medicare PPO

## 2020-08-28 ENCOUNTER — Observation Stay (HOSPITAL_BASED_OUTPATIENT_CLINIC_OR_DEPARTMENT_OTHER): Payer: Medicare PPO

## 2020-08-28 DIAGNOSIS — I6330 Cerebral infarction due to thrombosis of unspecified cerebral artery: Secondary | ICD-10-CM | POA: Diagnosis not present

## 2020-08-28 DIAGNOSIS — W19XXXA Unspecified fall, initial encounter: Secondary | ICD-10-CM | POA: Diagnosis present

## 2020-08-28 DIAGNOSIS — R630 Anorexia: Secondary | ICD-10-CM | POA: Diagnosis present

## 2020-08-28 DIAGNOSIS — I639 Cerebral infarction, unspecified: Secondary | ICD-10-CM | POA: Diagnosis present

## 2020-08-28 DIAGNOSIS — I6389 Other cerebral infarction: Secondary | ICD-10-CM | POA: Diagnosis not present

## 2020-08-28 DIAGNOSIS — R109 Unspecified abdominal pain: Secondary | ICD-10-CM | POA: Diagnosis not present

## 2020-08-28 DIAGNOSIS — G9340 Encephalopathy, unspecified: Secondary | ICD-10-CM | POA: Diagnosis not present

## 2020-08-28 DIAGNOSIS — I469 Cardiac arrest, cause unspecified: Secondary | ICD-10-CM | POA: Diagnosis not present

## 2020-08-28 DIAGNOSIS — Z8249 Family history of ischemic heart disease and other diseases of the circulatory system: Secondary | ICD-10-CM | POA: Diagnosis not present

## 2020-08-28 DIAGNOSIS — R4182 Altered mental status, unspecified: Secondary | ICD-10-CM | POA: Diagnosis not present

## 2020-08-28 DIAGNOSIS — E871 Hypo-osmolality and hyponatremia: Secondary | ICD-10-CM | POA: Diagnosis present

## 2020-08-28 DIAGNOSIS — Z90722 Acquired absence of ovaries, bilateral: Secondary | ICD-10-CM | POA: Diagnosis not present

## 2020-08-28 DIAGNOSIS — G9341 Metabolic encephalopathy: Secondary | ICD-10-CM | POA: Diagnosis present

## 2020-08-28 DIAGNOSIS — D46C Myelodysplastic syndrome with isolated del(5q) chromosomal abnormality: Secondary | ICD-10-CM | POA: Diagnosis present

## 2020-08-28 DIAGNOSIS — E7800 Pure hypercholesterolemia, unspecified: Secondary | ICD-10-CM | POA: Diagnosis present

## 2020-08-28 DIAGNOSIS — Z825 Family history of asthma and other chronic lower respiratory diseases: Secondary | ICD-10-CM | POA: Diagnosis not present

## 2020-08-28 DIAGNOSIS — E119 Type 2 diabetes mellitus without complications: Secondary | ICD-10-CM | POA: Diagnosis present

## 2020-08-28 DIAGNOSIS — R197 Diarrhea, unspecified: Secondary | ICD-10-CM | POA: Diagnosis present

## 2020-08-28 DIAGNOSIS — I361 Nonrheumatic tricuspid (valve) insufficiency: Secondary | ICD-10-CM | POA: Diagnosis not present

## 2020-08-28 DIAGNOSIS — K828 Other specified diseases of gallbladder: Secondary | ICD-10-CM | POA: Diagnosis present

## 2020-08-28 DIAGNOSIS — I6381 Other cerebral infarction due to occlusion or stenosis of small artery: Secondary | ICD-10-CM | POA: Diagnosis present

## 2020-08-28 DIAGNOSIS — Z833 Family history of diabetes mellitus: Secondary | ICD-10-CM | POA: Diagnosis not present

## 2020-08-28 DIAGNOSIS — D61818 Other pancytopenia: Secondary | ICD-10-CM | POA: Diagnosis present

## 2020-08-28 DIAGNOSIS — E1165 Type 2 diabetes mellitus with hyperglycemia: Secondary | ICD-10-CM | POA: Diagnosis not present

## 2020-08-28 DIAGNOSIS — I10 Essential (primary) hypertension: Secondary | ICD-10-CM | POA: Diagnosis present

## 2020-08-28 DIAGNOSIS — R1310 Dysphagia, unspecified: Secondary | ICD-10-CM | POA: Diagnosis present

## 2020-08-28 DIAGNOSIS — Z90710 Acquired absence of both cervix and uterus: Secondary | ICD-10-CM | POA: Diagnosis not present

## 2020-08-28 DIAGNOSIS — Z818 Family history of other mental and behavioral disorders: Secondary | ICD-10-CM | POA: Diagnosis not present

## 2020-08-28 DIAGNOSIS — Z20822 Contact with and (suspected) exposure to covid-19: Secondary | ICD-10-CM | POA: Diagnosis present

## 2020-08-28 DIAGNOSIS — R112 Nausea with vomiting, unspecified: Secondary | ICD-10-CM | POA: Diagnosis not present

## 2020-08-28 DIAGNOSIS — Y92009 Unspecified place in unspecified non-institutional (private) residence as the place of occurrence of the external cause: Secondary | ICD-10-CM | POA: Diagnosis not present

## 2020-08-28 DIAGNOSIS — E785 Hyperlipidemia, unspecified: Secondary | ICD-10-CM | POA: Diagnosis present

## 2020-08-28 DIAGNOSIS — R509 Fever, unspecified: Secondary | ICD-10-CM | POA: Diagnosis not present

## 2020-08-28 DIAGNOSIS — N390 Urinary tract infection, site not specified: Secondary | ICD-10-CM | POA: Diagnosis present

## 2020-08-28 DIAGNOSIS — I6782 Cerebral ischemia: Secondary | ICD-10-CM | POA: Diagnosis not present

## 2020-08-28 DIAGNOSIS — Z808 Family history of malignant neoplasm of other organs or systems: Secondary | ICD-10-CM | POA: Diagnosis not present

## 2020-08-28 DIAGNOSIS — R042 Hemoptysis: Secondary | ICD-10-CM | POA: Diagnosis not present

## 2020-08-28 DIAGNOSIS — R29818 Other symptoms and signs involving the nervous system: Secondary | ICD-10-CM | POA: Diagnosis not present

## 2020-08-28 DIAGNOSIS — Z886 Allergy status to analgesic agent status: Secondary | ICD-10-CM | POA: Diagnosis not present

## 2020-08-28 LAB — GLUCOSE, CAPILLARY
Glucose-Capillary: 117 mg/dL — ABNORMAL HIGH (ref 70–99)
Glucose-Capillary: 151 mg/dL — ABNORMAL HIGH (ref 70–99)

## 2020-08-28 LAB — ECHOCARDIOGRAM COMPLETE
AR max vel: 2.61 cm2
AV Area VTI: 2.52 cm2
AV Area mean vel: 2.72 cm2
AV Mean grad: 5.4 mmHg
AV Peak grad: 11.3 mmHg
Ao pk vel: 1.68 m/s
Area-P 1/2: 3.77 cm2
S' Lateral: 1.9 cm

## 2020-08-28 LAB — LIPID PANEL
Cholesterol: 99 mg/dL (ref 0–200)
HDL: 40 mg/dL — ABNORMAL LOW (ref >40)
LDL Cholesterol: 35 mg/dL (ref 0–99)
Total CHOL/HDL Ratio: 2.5 RATIO
Triglycerides: 118 mg/dL (ref <150)
VLDL: 24 mg/dL (ref 0–40)

## 2020-08-28 LAB — HEMOGLOBIN A1C
Hgb A1c MFr Bld: 6 % — ABNORMAL HIGH (ref 4.8–5.6)
Mean Plasma Glucose: 125.5 mg/dL

## 2020-08-28 LAB — CBG MONITORING, ED
Glucose-Capillary: 133 mg/dL — ABNORMAL HIGH (ref 70–99)
Glucose-Capillary: 143 mg/dL — ABNORMAL HIGH (ref 70–99)

## 2020-08-28 LAB — VITAMIN B12: Vitamin B-12: 843 pg/mL (ref 180–914)

## 2020-08-28 LAB — AMMONIA: Ammonia: 11 umol/L (ref 9–35)

## 2020-08-28 LAB — RPR: RPR Ser Ql: NONREACTIVE

## 2020-08-28 LAB — TSH: TSH: 1.571 u[IU]/mL (ref 0.350–4.500)

## 2020-08-28 LAB — FOLATE: Folate: 34.7 ng/mL (ref >5.9)

## 2020-08-28 MED ORDER — SODIUM CHLORIDE 0.9 % IV SOLN
2.0000 g | Freq: Two times a day (BID) | INTRAVENOUS | Status: DC
  Administered 2020-08-28 – 2020-08-29 (×3): 2 g via INTRAVENOUS
  Filled 2020-08-28 (×3): qty 2

## 2020-08-28 MED ORDER — ACETAMINOPHEN 325 MG PO TABS
650.0000 mg | ORAL_TABLET | Freq: Four times a day (QID) | ORAL | Status: DC | PRN
  Administered 2020-09-02: 650 mg via ORAL
  Filled 2020-08-28 (×2): qty 2

## 2020-08-28 MED ORDER — STROKE: EARLY STAGES OF RECOVERY BOOK: Freq: Once | Status: AC

## 2020-08-28 MED ORDER — ACETAMINOPHEN 650 MG RE SUPP
650.0000 mg | Freq: Once | RECTAL | Status: AC
  Administered 2020-08-28: 05:00:00 650 mg via RECTAL
  Filled 2020-08-28: qty 1

## 2020-08-28 MED ORDER — CLOPIDOGREL BISULFATE 75 MG PO TABS
75.0000 mg | ORAL_TABLET | Freq: Every day | ORAL | Status: DC
  Administered 2020-08-28 – 2020-09-10 (×10): 75 mg via ORAL
  Filled 2020-08-28 (×11): qty 1

## 2020-08-28 MED ORDER — VANCOMYCIN HCL IN DEXTROSE 1-5 GM/200ML-% IV SOLN: 1000.0000 mg | INTRAVENOUS | Status: DC

## 2020-08-28 MED ORDER — SODIUM CHLORIDE 0.9 % IV SOLN: INTRAVENOUS | Status: AC

## 2020-08-28 MED ORDER — VANCOMYCIN HCL 1500 MG/300ML IV SOLN
1500.0000 mg | Freq: Once | INTRAVENOUS | Status: AC
  Administered 2020-08-28: 09:00:00 1500 mg via INTRAVENOUS
  Filled 2020-08-28: qty 300

## 2020-08-28 MED ORDER — ASPIRIN 300 MG RE SUPP: 300.0000 mg | Freq: Every day | RECTAL | Status: DC

## 2020-08-28 NOTE — Evaluation (Signed)
Speech Language Pathology Evaluation Patient Details Name: Kara Griffin MRN: [id number] DOB: Oct 04, 1935 Today's Date: 08/28/2020 Time: 0940-1000 SLP Time Calculation (min) (ACUTE ONLY): 20 min  Problem List:  Patient Active Problem List   Diagnosis Date Noted  . Cerebral thrombosis with cerebral infarction 08/28/2020  . Ischemic stroke ([place]) 08/28/2020  . Back pain 08/14/2020  . Confusion 08/31/2020  . Acute encephalopathy 08/06/2020  . Pancytopenia ([place]) 08/20/2020  . Diarrhea 08/25/2020  . Skin lesion 06/21/2020  . Pain of right thumb 05/25/2017  . Fall 02/14/2017  . Neuropathy 05/17/2016  . Left hip pain 01/15/2016  . Health care maintenance 01/19/2015  . Dizziness 01/16/2015  . BMI 33.0-33.9,adult 09/14/2014  . Hypercholesterolemia 08/13/2012  . Thyroid nodule 08/13/2012  . Osteopenia 08/13/2012  . Myelodysplastic syndrome ([place]) 06/04/2012  . Hypertension 06/04/2012  . Diabetes mellitus ([place]) 06/04/2012  . Environmental allergies 06/04/2012   Past Medical History:  Past Medical History:  Diagnosis Date  . Anemia   . Diabetes mellitus ([place])   . Environmental allergies   . Hypercholesterolemia   . Hypertension   . Myelodysplastic syndrome ([place])    Sees Dr Jerrye Noble   Past Surgical History:  Past Surgical History:  Procedure Laterality Date  . ABDOMINAL HYSTERECTOMY    . [place]  . DILATION AND CURETTAGE OF UTERUS    . TONSILECTOMY, ADENOIDECTOMY, BILATERAL MYRINGOTOMY AND TUBES  1963  . TOTAL ABDOMINAL HYSTERECTOMY W/ BILATERAL SALPINGOOPHORECTOMY  1985   secondary to abnormal cells and abnormal uterine bleeding   HPI:  Kara Griffin is a 84 y.o. female with PMH significant for DM2, HTN, Myelodysplastic syndrome, HLD who is admitted with dizziness and falls that have been progressively worsening for the last month. She was brought in by family for worsening confusion and now in her bed all day x 2 days.Son reports the  patient has had some symptoms of gastroenteritis with nausea, has not thrown up and some diarrhea. MRI brain demonstrated an acute right basal ganglia lacunar infarct. Neurologist feels Stroke is an incidental finding, unrelated to waxing and waning mentation.   Assessment / Plan / Recommendation Clinical Impression  Pt demonstrates primary cognitive impairment in areas of memory, awareness and reasoning. Speech and language are WNL. Pt is able toverbalize that she is in the hospital due to Dr. saying she had a stroke and family being worried about her, but does not feel there is anything particularly wrong. SHe is stimulable to contextual and visual cues to improve awareness and memory, but will need futher SLP interventions to address cognition for adequate safety. Recommend SNF at d/c.    SLP Assessment  SLP Recommendation/Assessment: Patient needs continued Speech Lanaguage Pathology Services SLP Visit Diagnosis: Cognitive communication deficit (R41.841)    Follow Up Recommendations  Skilled Nursing facility    Frequency and Duration min 2x/week  2 weeks      SLP Evaluation Cognition  Overall Cognitive Status: Impaired/Different from baseline Arousal/Alertness: Awake/alert Orientation Level: Oriented to person;Oriented to place;Oriented to situation;Disoriented to time Attention: Sustained Sustained Attention: Appears intact Memory: Impaired Memory Impairment: Storage deficit Awareness: Impaired Awareness Impairment: Emergent impairment;Anticipatory impairment Problem Solving: Impaired Problem Solving Impairment: Verbal complex Executive Function: Reasoning;Self Monitoring;Self Correcting Reasoning: Impaired Reasoning Impairment: Verbal basic Self Monitoring: Impaired Self Monitoring Impairment: Verbal basic Self Correcting: Impaired Self Correcting Impairment: Verbal basic       Comprehension  Auditory Comprehension Overall Auditory Comprehension: Appears within  functional limits for tasks assessed Reading Comprehension Reading Status:  Within funtional limits    Expression Verbal Expression Overall Verbal Expression: Appears within functional limits for tasks assessed Written Expression Dominant Hand: Right   Oral / Motor  Oral Motor/Sensory Function Overall Oral Motor/Sensory Function: Within functional limits Motor Speech Overall Motor Speech: Appears within functional limits for tasks assessed   GO                    Eddison Searls, Katherene Ponto 08/28/2020, 11:26 AM

## 2020-08-28 NOTE — Evaluation (Signed)
Occupational Therapy Evaluation Patient Details Name: Kara Griffin MRN: [id number] DOB: [birth year] Today's Date: 08/28/2020    History of Present Illness 84 y.o. female with history of myelodysplastic syndrome being followed by Dr. Florene Glen at [facility] on [place], pancytopenia, diabetes mellitus, hypertension and hyperlipidemia was brought to the ED by patient's son after patient was found to be increasingly confused last 2 days. On August 19, 2020, patient was taken to [facility] after patient had a fall at home. CT and xray were negative during that ED visit and pt was d/c'd home. Pt underwent MRI 12/23 which revealed small R bagal ganglia lacunar infarct.   Clinical Impression   PTA, pt was living alone and performing ADLs with use of SPC; since fall on 15th, pt's family has been providing increased support and physical A than normal. Pt currently presenting with decreased cognition, strength, balance, and activity tolerance. Pt reporting increased back pain with positional changes decreasing her functional performance. Pt would benefit from further acute OT to facilitate safe dc. Recommend dc to SNF for further OT to optimize safety, independence with ADLs, and return to PLOF. \    Follow Up Recommendations  SNF    Equipment Recommendations  None recommended by OT    Recommendations for Other Services PT consult;Speech consult     Precautions / Restrictions Precautions Precautions: Fall      Mobility Bed Mobility Overal bed mobility: Needs Assistance Bed Mobility: Rolling;Sidelying to Sit;Sit to Sidelying Rolling: +2 for physical assistance;Mod assist Sidelying to sit: +2 for physical assistance;Max assist     Sit to sidelying: Mod assist;+2 for physical assistance General bed mobility comments: logroll for comfort due to back pain. Pt resistive initially due to pain. Pt with report of pain relief upon sitting. Assist with all aspects of mobility. Decreased initiation.  Increased time to complete.    Transfers Overall transfer level: Needs assistance Equipment used: Straight cane;1 person hand held assist Transfers: Sit to/from Stand Sit to Stand: +2 physical assistance;Mod assist;From elevated surface         General transfer comment: assist to power up and stabilize balance    Balance Overall balance assessment: Needs assistance Sitting-balance support: Feet supported;No upper extremity supported Sitting balance-Leahy Scale: Fair     Standing balance support: Bilateral upper extremity supported;During functional activity Standing balance-Leahy Scale: Poor Standing balance comment: reliant on external support                           ADL either performed or assessed with clinical judgement   ADL Overall ADL's : Needs assistance/impaired Eating/Feeding: Set up;Sitting   Grooming: Set up;Supervision/safety;Sitting   Upper Body Bathing: Minimal assistance;Sitting   Lower Body Bathing: Moderate assistance;Sit to/from stand   Upper Body Dressing : Minimal assistance;Sitting   Lower Body Dressing: Sit to/from stand;Maximal assistance Lower Body Dressing Details (indicate cue type and reason): max a to don slippers Toilet Transfer: Moderate assistance;+2 for physical assistance;Ambulation (simulated in room)           Functional mobility during ADLs: Moderate assistance;+2 for safety/equipment;+2 for physical assistance;Cane General ADL Comments: Pt presenting with poor cognition, balance, strength, and activity tolerance     Vision Baseline Vision/History: Wears glasses Patient Visual Report: No change from baseline       Perception     Praxis      Pertinent Vitals/Pain Pain Assessment: Faces Faces Pain Scale: Hurts even more Pain Location: low back with mobility (Son  reports pt with c/o back pain since 12/15 fall. Xray negative 12/15.) Pain Descriptors / Indicators: Guarding;Moaning;Grimacing;Discomfort Pain  Intervention(s): Monitored during session;Limited activity within patient's tolerance;Repositioned     Hand Dominance Right   Extremity/Trunk Assessment Upper Extremity Assessment Upper Extremity Assessment: Generalized weakness   Lower Extremity Assessment Lower Extremity Assessment: Defer to PT evaluation   Cervical / Trunk Assessment Cervical / Trunk Assessment: Kyphotic   Communication Communication Communication: HOH;Receptive difficulties;Expressive difficulties   Cognition Arousal/Alertness: Awake/alert Behavior During Therapy: WFL for tasks assessed/performed Overall Cognitive Status: Impaired/Different from baseline Area of Impairment: Orientation;Attention;Memory;Following commands;Safety/judgement;Awareness;Problem solving                 Orientation Level: Disoriented to;Place;Time;Situation (Pt knew she was in the hospital but thought she was in [place]. Stated the year as 1961.) Current Attention Level: Sustained Memory: Decreased short-term memory Following Commands: Follows one step commands inconsistently;Follows one step commands with increased time Safety/Judgement: Decreased awareness of safety;Decreased awareness of deficits Awareness: Intellectual Problem Solving: Slow processing;Decreased initiation;Difficulty sequencing;Requires verbal cues;Requires tactile cues     General Comments  VSS on RA. HR 80-90s. BP stable. Son present throughout    Exercises     Shoulder Instructions      Home Living Family/patient expects to be discharged to:: Private residence Living Arrangements: Alone Available Help at Discharge: Family;Available PRN/intermittently (possibly able to provide 24-hour assist) Type of Home: House Home Access: Stairs to enter [employer] of Steps: 4 Entrance Stairs-Rails: Left Home Layout: One level     Bathroom Shower/Tub: Tub/shower unit;Curtain   Bathroom Toilet: Handicapped height Bathroom Accessibility: No  (Instructed son on turning RW wheels to inside for access through narrow doorways.)   Home Equipment: Walker - 2 wheels;Cane - single point;Shower seat   Additional Comments: Family is in the process of building a ramp. Comfort height toilets were just installed. Family is also currently getting quotes to convert tub/shower to walk-in shower.      Prior Functioning/Environment Level of Independence: Independent with assistive device(s)        Comments: Prior to 12/15, pt mod I using SPC. Since 12/15, family has been staying with pt providing 24-hour care. Assist has been required for mobility and all ADLs. And pt ambulating with RW.        OT Problem List: Decreased strength;Decreased activity tolerance;Decreased range of motion;Impaired balance (sitting and/or standing);Decreased safety awareness;Decreased knowledge of use of DME or AE;Decreased knowledge of precautions;Decreased cognition;Pain      OT Treatment/Interventions: Self-care/ADL training;Therapeutic exercise;Energy conservation;DME and/or AE instruction;Therapeutic activities;Patient/family education    OT Goals(Current goals can be found in the care plan section) Acute Rehab OT Goals Patient Stated Goal: home per son OT Goal Formulation: With patient Time For Goal Achievement: 09/11/20 Potential to Achieve Goals: Good  OT Frequency: Min 2X/week   Barriers to D/C:            Co-evaluation              AM-PAC OT "6 Clicks" Daily Activity     Outcome Measure Help from another person eating meals?: A Little Help from another person taking care of personal grooming?: A Little Help from another person toileting, which includes using toliet, bedpan, or urinal?: A Lot Help from another person bathing (including washing, rinsing, drying)?: A Lot Help from another person to put on and taking off regular upper body clothing?: A Little Help from another person to put on and taking off regular lower body clothing?: A  Lot  6 Click Score: 15   End of Session Equipment Utilized During Treatment: Gait belt;Other (comment) [facility]) Nurse Communication: Mobility status;Other (comment) (IV leaking)  Activity Tolerance: Patient limited by fatigue;Patient limited by pain Patient left: in bed;with call bell/phone within reach;with nursing/sitter in room  OT Visit Diagnosis: Unsteadiness on feet (R26.81);Other abnormalities of gait and mobility (R26.89);Muscle weakness (generalized) (M62.81);History of falling (Z91.81);Pain Pain - part of body:  (Back)                Time: 8185-6314 OT Time Calculation (min): 30 min Charges:  OT General Charges $OT Visit: 1 Visit OT Evaluation $OT Eval Moderate Complexity: [place], OTR/L Acute Rehab Pager: [phone] Office: [place] 08/28/2020, 10:19 AM

## 2020-08-28 NOTE — Progress Notes (Signed)
PROGRESS NOTE    Kara Griffin  [name] DOB: 10-15-1935 DOA: 08/07/2020 PCP: Einar Pheasant, MD   Brief Narrative:  Patient is 84 year old female with past medical history of myelodysplastic syndrome-followed by Dr. Florene Glen at [facility] on [place], pancytopenia, diabetes mellitus, hypertension, hyperlipidemia presents to emergency department with increasingly confusion since 2 days.Patient son states that since Thanksgiving almost a month and a half ago patient has become gradually weak and not herself.  Recently her blood pressure was found to be low and her primary care physician decreased her antihypertensives during the recent visit 2 days ago.  Patient also has chronic dizziness.  On August 19, 2020 about 8 days ago patient was taken to [facility] after patient had a fall at home.  Over the CT head C-spine and abdomen was done which showed features concerning for right-sided mucous plugging and MAI features and was given Z-Pak.  Following which patient had some couple of episodes of diarrhea.  Has not recorded any fever at home.  Has been having poor appetite with some nausea.  CT scan also at abdomen showed some gallbladder distention with no definite signs of any inflammation.  ED Course: In the ER patient is generally weak oriented to name and place.  Moving all extremities.  At the time of my exam was afebrile not hypoxic Covid test was negative CBC shows pancytopenia at baseline high-sensitivity and was 8 and blood glucose of 161 sodium 132.  LFTs were normal.  MRI brain has been ordered and admitted for acute encephalopathy with generalized weakness and poor appetite.  Assessment & Plan:   Acute right basal ganglia lacunar infarct: -Reviewed MRI brain. -Allow permissive hypertension. -Echo shows ejection fraction more than [id number], grade 1 diastolic dysfunction, -Carotid Doppler: Pending -A1c 6.0%, lipid panel: WNL -Neurology recommended aspirin 81 mg and Plavix 75 mg  daily for 3 weeks followed by aspirin alone.  Continue statin.  Appreciate neurology recommendations. -Consult PT/OT/SLP. -We will keep her n.p.o. until she passes bedside swallow evaluation.  Acute metabolic encephalopathy: -Likely in the setting of UTI.  UA looks infected.  Patient had fever of 100.5 in ED.  Patient started on broad-spectrum antibiotics.  Urine culture is pending.  Continue IV antibiotics and follow urine culture result.  Blood culture is pending. -B12, folate, TSH, ammonia level, RPR: All came back within normal limits. -EEG negative for seizures -CT renal stone study, right upper quadrant ultrasound, chest x-ray: Negative for acute findings.  COVID-19 negative.  Continue cefepime.  Discontinue vancomycin.  Follow culture result.  C. difficile is pending. -Continue with PT/OT.  Neurochecks.  Hypertension: -Allow permissive hypertension and hold p.o. meds for now.  Monitor blood pressure closely.  Hyperlipidemia: Continue statin  Type 2 diabetes mellitus: Well controlled.  A1c 6.0%.  Tinea sliding scale insulin.  Myelodysplastic syndrome and pancytopenia: -Followed by oncologist Dr. Florene Glen at [facility]. -WBC/H&H/platelet: At baseline.  Continue to monitor.  DVT prophylaxis: SCD Code Status: Full code Family Communication: Patient's son present at bedside.  Plan of care discussed with patient in length and she verbalized understanding and agreed with it. Disposition Plan: To be determined  Consultants:   Neurology  Procedures:   MRI/MRA brain  EEG  Carotid Doppler  CT renal study  Right upper quadrant ultrasound  Antimicrobials:   Cefepime  Vancomycin  Status is: Inpatient  Remains inpatient appropriate because:Ongoing diagnostic testing needed not appropriate for outpatient work up   Dispo: The patient is from: Home  Anticipated d/c is to: SNF              Anticipated d/c date is: 2 days              Patient currently is not  medically stable to d/c.         Subjective: Patient seen and examined.  Son at bedside.  Patient denies any complaints including chest pain, headache, lightheadedness, nausea, vomiting, abdominal pain.  She is alert and following commands.  Objective: Vitals:   08/28/20 0500 08/28/20 0630 08/28/20 0715 08/28/20 0841  BP: (!) 172/83  (!) 118/49 (!) 146/57  Pulse: 92  79 87  Resp: 18  19 (!) 21  Temp:  98.5 F (36.9 C)    TempSrc:  Oral    SpO2: 96%   98%    Intake/Output Summary (Last 24 hours) at 08/28/2020 1346 Last data filed at 08/28/2020 1146 Gross per 24 hour  Intake 400 ml  Output --  Net 400 ml   There were no vitals filed for this visit.  Examination:  General exam: Appears calm and comfortable, on room air, elderly, communicating well and following commands. Respiratory system: Clear to auscultation. Respiratory effort normal. Cardiovascular system: S1 & S2 heard, RRR. No JVD, murmurs, rubs, gallops or clicks. No pedal edema. Gastrointestinal system: Abdomen is nondistended, soft and nontender. No organomegaly or masses felt. Normal bowel sounds heard. Central nervous system: Alert and oriented.  Following commands.  Skin: No rashes, lesions or ulcers    Data Reviewed: I have personally reviewed following labs and imaging studies  CBC: Recent Labs  Lab 08/12/2020 1814  WBC 3.4*  HGB 11.2*  HCT 34.5*  MCV 89.6  PLT [id number]*   Basic Metabolic Panel: Recent Labs  Lab 09/02/2020 1814  NA 132*  K 3.5  CL 96*  CO2 24  GLUCOSE 161*  BUN 13  CREATININE 0.95  CALCIUM 9.7   GFR: Estimated Creatinine Clearance: 41.4 mL/min (by C-G formula based on SCr of 0.95 mg/dL). Liver Function Tests: Recent Labs  Lab 08/29/2020 1814  AST 11*  ALT 15  ALKPHOS 58  BILITOT 0.8  PROT 6.8  ALBUMIN 3.8   No results for input(s): LIPASE, AMYLASE in the last 168 hours. Recent Labs  Lab 08/07/2020 2356  AMMONIA 11   Coagulation Profile: No results for input(s):  INR, PROTIME in the last 168 hours. Cardiac Enzymes: No results for input(s): CKTOTAL, CKMB, CKMBINDEX, TROPONINI in the last 168 hours. BNP (last 3 results) No results for input(s): PROBNP in the last 8760 hours. HbA1C: Recent Labs    08/28/20 0455  HGBA1C 6.0*   CBG: Recent Labs  Lab 08/07/2020 1817 08/16/2020 2047 08/28/20 0804 08/28/20 1232  GLUCAP 154* 154* 133* 143*   Lipid Profile: Recent Labs    08/28/20 0455  CHOL 99  HDL 40*  LDLCALC 35  TRIG 118  CHOLHDL 2.5   Thyroid Function Tests: Recent Labs    09/03/2020 2356  TSH 1.571   Anemia Panel: Recent Labs    08/09/2020 2356 08/28/20 0416  VITAMINB12 843  --   FOLATE  --  34.7   Sepsis Labs: No results for input(s): PROCALCITON, LATICACIDVEN in the last 168 hours.  Recent Results (from the past 240 hour(s))  Urine culture     Status: Abnormal   Collection Time: 08/19/20  3:59 PM   Specimen: Urine, Random  Result Value Ref Range Status   Specimen Description   Final  URINE, RANDOM Performed at [facility], [address] [place], [place] [address]    Special Requests   Final    NONE Performed at [facility], [address] [place], [place] [address]    Culture MULTIPLE SPECIES PRESENT, SUGGEST RECOLLECTION (A)  Final   Report Status 08/20/2020 FINAL  Final  Resp Panel by RT-PCR (Flu A&B, Covid) Nasopharyngeal Swab     Status: None   Collection Time: 09-05-2020 10:15 PM   Specimen: Nasopharyngeal Swab; Nasopharyngeal(NP) swabs in vial transport medium  Result Value Ref Range Status   SARS Coronavirus 2 by RT PCR NEGATIVE NEGATIVE Final    Comment: (NOTE) SARS-CoV-2 target nucleic acids are NOT DETECTED.  The SARS-CoV-2 RNA is generally detectable in upper respiratory specimens during the acute phase of infection. The lowest concentration of SARS-CoV-2 viral copies this assay can detect is 138 copies/mL. A negative result does not preclude SARS-Cov-2 infection and should  not be used as the sole basis for treatment or other patient management decisions. A negative result may occur with  improper specimen collection/handling, submission of specimen other than nasopharyngeal swab, presence of viral mutation(s) within the areas targeted by this assay, and inadequate number of viral copies(<138 copies/mL). A negative result must be combined with clinical observations, patient history, and epidemiological information. The expected result is Negative.  Fact Sheet for Patients:  EntrepreneurPulse.com.au  Fact Sheet for Healthcare Providers:  IncredibleEmployment.be  This test is no t yet approved or cleared by the [place] FDA and  has been authorized for detection and/or diagnosis of SARS-CoV-2 by FDA under an Emergency Use Authorization (EUA). This EUA will remain  in effect (meaning this test can be used) for the duration of the COVID-19 declaration under Section 564(b)(1) of the Act, 21 U.S.C.section 360bbb-3(b)(1), unless the authorization is terminated  or revoked sooner.       Influenza A by PCR NEGATIVE NEGATIVE Final   Influenza B by PCR NEGATIVE NEGATIVE Final    Comment: (NOTE) The Xpert Xpress SARS-CoV-2/FLU/RSV plus assay is intended as an aid in the diagnosis of influenza from Nasopharyngeal swab specimens and should not be used as a sole basis for treatment. Nasal washings and aspirates are unacceptable for Xpert Xpress SARS-CoV-2/FLU/RSV testing.  Fact Sheet for Patients: EntrepreneurPulse.com.au  Fact Sheet for Healthcare Providers: IncredibleEmployment.be  This test is not yet approved or cleared by the [place] FDA and has been authorized for detection and/or diagnosis of SARS-CoV-2 by FDA under an Emergency Use Authorization (EUA). This EUA will remain in effect (meaning this test can be used) for the duration of the COVID-19 declaration under  Section 564(b)(1) of the Act, 21 U.S.C. section 360bbb-3(b)(1), unless the authorization is terminated or revoked.  Performed at [facility], [facility] [address], [place], [place] [address]       Radiology Studies: MR ANGIO HEAD WO CONTRAST  Result Date: 08/28/2020 CLINICAL DATA:  Stroke follow-up. Acute right basal ganglia infarct on earlier MRI. EXAM: MRA HEAD WITHOUT CONTRAST TECHNIQUE: Angiographic images of the Circle of Willis were obtained using MRA technique without intravenous contrast. COMPARISON:  None. FINDINGS: The visualized distal vertebral arteries are widely patent to the basilar with the left being strongly dominant. Patent AICA and SCA origins are identified bilaterally. The basilar artery is widely patent. Posterior communicating arteries are not identified and may be diminutive or absent. The PCAs are patent without evidence of a significant proximal stenosis. There are distal cervical internal carotid artery loops  bilaterally. The intracranial internal carotid arteries are patent without evidence of significant stenosis allowing for motion artifact through the anterior genu of the left ICA. ACAs and MCAs are patent without evidence of a proximal branch occlusion or significant M1 stenosis. Assessment for branch vessel stenosis is limited by motion. There is a moderate stenosis versus motion artifact in the distal left A1 segment. No aneurysm is identified. IMPRESSION: 1. No large vessel occlusion. 2. Moderate left A1 stenosis versus motion artifact. Electronically Signed   By: Logan Bores M.D.   On: 08/28/2020 03:11   MR BRAIN WO CONTRAST  Result Date: 08/28/2020 CLINICAL DATA:  Dizziness.  Memory loss. EXAM: MRI HEAD WITHOUT CONTRAST TECHNIQUE: Multiplanar, multiecho pulse sequences of the brain and surrounding structures were obtained without intravenous contrast. COMPARISON:  Head CT 08/19/2020 FINDINGS: Brain: There is a 4 mm acute infarct at the anteroinferior aspect  of the right basal ganglia. Small T2 hyperintensities scattered throughout the cerebral white matter bilaterally are nonspecific but compatible with mild chronic small vessel ischemic disease. Two small adjacent chronic hemorrhages are noted in the subcortical white matter of the left frontal lobe. Mild cerebral atrophy is within normal limits for age. Vascular: Major intracranial vascular flow voids are preserved. Skull and upper cervical spine: Unremarkable bone marrow signal. Sinuses/Orbits: Bilateral cataract extraction. Paranasal sinuses and mastoid air cells are clear. Other: None. IMPRESSION: 1. Acute right basal ganglia lacunar infarct. 2. Mild chronic small vessel ischemic disease. Electronically Signed   By: Logan Bores M.D.   On: 08/28/2020 01:18   DG CHEST PORT 1 VIEW  Result Date: 08/28/2020 CLINICAL DATA:  Fever EXAM: PORTABLE CHEST 1 VIEW COMPARISON:  08/19/2020 FINDINGS: Borderline heart size. Blunting at the lateral left costophrenic sulcus that is stable and most likely from mediastinal fat. Reticulation of lung markings. There is signs of chronic indolent infection/scarring in the lower lungs on recent abdominal CT. There is no edema, consolidation, effusion, or pneumothorax. IMPRESSION: Stable from prior.  No acute finding. Electronically Signed   By: Monte Fantasia M.D.   On: 08/28/2020 07:05   EEG adult  Result Date: 08/28/2020 Lora Havens, MD     08/28/2020 12:15 PM Patient Name: Aaliya Schenone MRN: [id number] Epilepsy Attending: Lora Havens Referring Physician/Provider: Dr Antony Contras Date: 08/28/2020 Duration: 24.43 mins Patient history: 84yo F with R basal ganglia infarct and cognitive impairment. EEG to evaluate for seizure. Level of alertness: Awake AEDs during EEG study: None Technical aspects: This EEG study was done with scalp electrodes positioned according to the 10-20 International system of electrode placement. Electrical activity was acquired at a  sampling rate of 500Hz  and reviewed with a high frequency filter of 70Hz  and a low frequency filter of 1Hz . EEG data were recorded continuously and digitally stored. Description: The posterior dominant rhythm consists of 8 Hz activity of moderate voltage (25-35 uV) seen predominantly in posterior head regions, symmetric and reactive to eye opening and eye closing. EEG showed intermittent generalized 3 to 6 Hz theta-delta slowing.  Hyperventilation and photic stimulation were not performed.   ABNORMALITY -Intermittent slow, generalized IMPRESSION: This study is suggestive of mild diffuse encephalopathy, nonspecific etiology. No seizures or epileptiform discharges were seen throughout the recording. Lora Havens   ECHOCARDIOGRAM COMPLETE  Result Date: 08/28/2020    ECHOCARDIOGRAM REPORT   Patient Name:   Benson Setting Date of Exam: 08/28/2020 Medical Rec #:  [id number]  Height:       61.0 in Accession #:    [id number]             Weight:       159.0 lb Date of Birth:  07/20/1936              BSA:          1.713 m Patient Age:    48 years               BP:           118/49 mmHg Patient Gender: F                      HR:           88 bpm. Exam Location:  Inpatient Procedure: 2D Echo, Cardiac Doppler and Color Doppler Indications:    Stroke 434.91 / I163.9  History:        Patient has no prior history of Echocardiogram examinations.                 Risk Factors:Hypertension and Diabetes.  Sonographer:    Bernadene Person RDCS Referring Phys: [place]  1. Left ventricular ejection fraction, by estimation, is >75%. The left ventricle has normal function. The left ventricle has no regional wall motion abnormalities. Left ventricular diastolic parameters are consistent with Grade I diastolic dysfunction (impaired relaxation).  2. Right ventricular systolic function is normal. The right ventricular size is normal. There is mildly elevated pulmonary artery systolic  pressure.  3. The mitral valve is normal in structure. No evidence of mitral valve regurgitation. No evidence of mitral stenosis.  4. The aortic valve is tricuspid. There is mild calcification of the aortic valve. There is mild thickening of the aortic valve. Aortic valve regurgitation is not visualized. No aortic stenosis is present.  5. The inferior vena cava is normal in size with greater than 50% respiratory variability, suggesting right atrial pressure of 3 mmHg. FINDINGS  Left Ventricle: Left ventricular ejection fraction, by estimation, is >75%. The left ventricle has normal function. The left ventricle has no regional wall motion abnormalities. The left ventricular internal cavity size was normal in size. There is no left ventricular hypertrophy. Left ventricular diastolic parameters are consistent with Grade I diastolic dysfunction (impaired relaxation). Normal left ventricular filling pressure. Right Ventricle: The right ventricular size is normal. No increase in right ventricular wall thickness. Right ventricular systolic function is normal. There is mildly elevated pulmonary artery systolic pressure. The tricuspid regurgitant velocity is 2.96  m/s, and with an assumed right atrial pressure of 3 mmHg, the estimated right ventricular systolic pressure is 99991111 mmHg. Left Atrium: Left atrial size was normal in size. Right Atrium: Right atrial size was normal in size. Pericardium: There is no evidence of pericardial effusion. Mitral Valve: The mitral valve is normal in structure. No evidence of mitral valve regurgitation. No evidence of mitral valve stenosis. Tricuspid Valve: The tricuspid valve is normal in structure. Tricuspid valve regurgitation is mild . No evidence of tricuspid stenosis. Aortic Valve: The aortic valve is tricuspid. There is mild calcification of the aortic valve. There is mild thickening of the aortic valve. There is mild aortic valve annular calcification. Aortic valve regurgitation is  not visualized. No aortic stenosis  is present. Aortic valve mean gradient measures 5.4 mmHg. Aortic valve peak gradient measures 11.3 mmHg. Aortic valve area, by VTI measures 2.52 cm. Pulmonic Valve: The pulmonic  valve was not well visualized. Pulmonic valve regurgitation is not visualized. No evidence of pulmonic stenosis. Aorta: The aortic root is normal in size and structure. Pulmonary Artery: 35. Venous: The inferior vena cava is normal in size with greater than 50% respiratory variability, suggesting right atrial pressure of 3 mmHg. IAS/Shunts: No atrial level shunt detected by color flow Doppler.  LEFT VENTRICLE PLAX 2D LVIDd:         4.00 cm  Diastology LVIDs:         1.90 cm  LV e' medial:    7.34 cm/s LV PW:         1.00 cm  LV E/e' medial:  11.9 LV IVS:        1.00 cm  LV e' lateral:   6.09 cm/s LVOT diam:     2.10 cm  LV E/e' lateral: 14.4 LV SV:         72 LV SV Index:   42 LVOT Area:     3.46 cm  RIGHT VENTRICLE RV S prime:     18.10 cm/s TAPSE (M-mode): 1.9 cm LEFT ATRIUM             Index       RIGHT ATRIUM           Index LA diam:        2.60 cm 1.52 cm/m  RA Area:     12.40 cm LA Vol (A2C):   32.6 ml 19.03 ml/m RA Volume:   28.30 ml  16.52 ml/m LA Vol (A4C):   42.1 ml 24.57 ml/m LA Biplane Vol: 36.8 ml 21.48 ml/m  AORTIC VALVE AV Area (Vmax):    2.61 cm AV Area (Vmean):   2.72 cm AV Area (VTI):     2.52 cm AV Vmax:           168.33 cm/s AV Vmean:          109.711 cm/s AV VTI:            0.286 m AV Peak Grad:      11.3 mmHg AV Mean Grad:      5.4 mmHg LVOT Vmax:         127.00 cm/s LVOT Vmean:        86.300 cm/s LVOT VTI:          0.208 m LVOT/AV VTI ratio: 0.73  AORTA Ao Root diam: 3.00 cm Ao Asc diam:  3.00 cm MITRAL VALVE                TRICUSPID VALVE MV Area (PHT): 3.77 cm     TR Peak grad:   35.0 mmHg MV Decel Time: 201 msec     TR Vmax:        296.00 cm/s MV E velocity: 87.50 cm/s MV A velocity: 158.00 cm/s  SHUNTS MV E/A ratio:  0.55         Systemic VTI:  0.21 m                              Systemic Diam: 2.10 cm Carlyle Dolly MD Electronically signed by Carlyle Dolly MD Signature Date/Time: 08/28/2020/8:53:08 AM    Final    CT RENAL STONE STUDY  Result Date: 08/28/2020 CLINICAL DATA:  Flank pain and fevers EXAM: CT ABDOMEN AND PELVIS WITHOUT CONTRAST TECHNIQUE: Multidetector CT imaging of the abdomen and pelvis was performed following the standard protocol without IV contrast.  COMPARISON:  08/19/2020 FINDINGS: Lower chest: Lung bases again demonstrates some bronchiectatic changes in the right middle lobe and lingula similar to that seen on prior study. No new focal infiltrate is seen. Hepatobiliary: No focal liver abnormality is seen. No gallstones, gallbladder wall thickening, or biliary dilatation. Pancreas: Unremarkable. No pancreatic ductal dilatation or surrounding inflammatory changes. Spleen: Normal in size without focal abnormality. Adrenals/Urinary Tract: Adrenal glands are within normal limits. Kidneys are well visualized bilaterally. No renal calculi or obstructive changes are seen. Small cyst is noted in the upper pole of the right kidney. The bladder is well distended. Stomach/Bowel: The appendix is not well visualized and may have been surgically removed. No obstructive or inflammatory changes of the large or small bowel are seen. Stomach is decompressed. Vascular/Lymphatic: Aortic atherosclerosis. No enlarged abdominal or pelvic lymph nodes. Reproductive: Status post hysterectomy. No adnexal masses. Other: No abdominal wall hernia or abnormality. No abdominopelvic ascites. Musculoskeletal: No acute or significant osseous findings. IMPRESSION: Changes similar to that seen on prior exam. No acute abnormality is noted. Electronically Signed   By: Inez Catalina M.D.   On: 08/28/2020 09:15   US Abdomen Limited RUQ (LIVER/GB)  Result Date: 08/28/2020 CLINICAL DATA:  Nausea and vomiting. EXAM: ULTRASOUND ABDOMEN LIMITED RIGHT UPPER QUADRANT COMPARISON:  CT scan  08/19/2020 FINDINGS: Gallbladder: No gallstones or gallbladder wall thickening. No pericholecystic fluid. The sonographer reports no sonographic Murphy's sign. Common bile duct: Diameter: 1-2 mm Liver: No focal lesion identified. Within normal limits in parenchymal echogenicity. Portal vein is patent on color Doppler imaging with normal direction of blood flow towards the liver. Other: None. IMPRESSION: No acute findings. No evidence to explain the patient's history of nausea and vomiting. Electronically Signed   By: Misty Stanley M.D.   On: 08/28/2020 08:04    Scheduled Meds: .  stroke: mapping our early stages of recovery book   Does not apply Once  . aspirin  81 mg Oral Daily  . aspirin  300 mg Rectal Daily  . insulin aspart  0-9 Units Subcutaneous TID WC  . lenalidomide  10 mg Oral Daily  . rosuvastatin  10 mg Oral q1800  . vitamin B-12  1,000 mcg Oral Daily   Continuous Infusions: . sodium chloride 75 mL/hr at 08/28/20 0630  . ceFEPime (MAXIPIME) IV Stopped (08/28/20 1024)  . [START ON 08/29/2020] vancomycin       LOS: 0 days   Time spent: 35 minutes  Mianna Iezzi Loann Quill, MD [facility]  If 7PM-7AM, please contact night-coverage [email or web address] 08/28/2020, 1:46 PM

## 2020-08-28 NOTE — Progress Notes (Signed)
Kara Griffin is  84 y.o. female patient admitted from ED, Arrived to the unit  via stretcher at 1637. Pateint  awake, alert  & orientated  X 3,  Full Code, VSS - Blood pressure (!) 129/54, pulse 79, temperature 98.2 F (36.8 C), temperature source Oral, resp. rate 20, last menstrual period 08/13/1984, SpO2 98 %., O2   Room air, no c/o shortness of breath, no c/o chest pain, no distress noted. Tele  in placed and pt is currently running:NSR   IV site WDL:  with a transparent dsg that's clean dry and intact.  orientation to unit, room and routine. Information packet given to patient/family and safety  Admission INP armband ID verified with patient/family, and in place. SR up x 2, fall risk assessment complete with Patient and family verbalizing understanding of risks associated with falls. Pt verbalizes an understanding of how to use the call bell and to call for help before getting out of bed.  Skin, clean-dry- intact without evidence of bruising, or skin tears. Redness and scab noted on the buttocks. Patient is in room resting at the moment with son at bed side.   Will cont to monitor and assist as needed.  Dorris Carnes, RN 08/28/2020 6:38 PM

## 2020-08-28 NOTE — CV Procedure (Signed)
Carotid Duplex completed.  Results can be found under chart review under CV PROC. 08/28/2020 2:21 PM Shamal Stracener RVT, RDMS

## 2020-08-28 NOTE — Progress Notes (Signed)
Pharmacy Antibiotic Note  Kara Griffin is a 84 y.o. female admitted on 08/17/2020 with AMS, found to have small acute right basal ganglia lacunar infarct, and now spiking fever >> concern for sepsis.  Pharmacy has been consulted for vancomycin and cefepime dosing.  Plan: Vancomycin 1500mg  x1 then 1000mg  IV every 24 hours.  Goal trough 15-20 mcg/mL. Cefepime 2g IV every 12 hours.   Temp (24hrs), Avg:99.4 F (37.4 C), Min:98.2 F (36.8 C), Max:100.5 F (38.1 C)  Recent Labs  Lab 08/13/2020 1814  WBC 3.4*  CREATININE 0.95    Estimated Creatinine Clearance: 41.4 mL/min (by C-G formula based on SCr of 0.95 mg/dL).    Allergies  Allergen Reactions  . Ibuprofen     Lowers WBCs     Thank you for allowing pharmacy to be a part of this patient's care.  Wynona Neat, PharmD, BCPS  08/28/2020 6:30 AM

## 2020-08-28 NOTE — ED Notes (Signed)
Patient transported to MRI 

## 2020-08-28 NOTE — Progress Notes (Signed)
  Echocardiogram 2D Echocardiogram has been performed.  Kara Griffin 08/28/2020, 8:28 AM

## 2020-08-28 NOTE — ED Notes (Signed)
Pt transported to vascular.  °

## 2020-08-28 NOTE — Plan of Care (Signed)
  Problem: Intracerebral Hemorrhage Tissue Perfusion: Goal: Complications of Intracerebral Hemorrhage will be minimized Outcome: Progressing   Problem: Intracerebral Hemorrhage Tissue Perfusion: Goal: Complications of Intracerebral Hemorrhage will be minimized Outcome: Progressing   Problem: Spontaneous Subarachnoid Hemorrhage Tissue Perfusion: Goal: Complications of Spontaneous Subarachnoid Hemorrhage will be minimized Outcome: Progressing   Problem: Education: Goal: Knowledge of disease or condition will improve Outcome: Progressing Goal: Knowledge of secondary prevention will improve Outcome: Progressing Goal: Knowledge of patient specific risk factors addressed and post discharge goals established will improve Outcome: Progressing Goal: Individualized Educational Video(s) Outcome: Progressing

## 2020-08-28 NOTE — Progress Notes (Signed)
Portable EEG completed, results pending. 

## 2020-08-28 NOTE — ED Notes (Signed)
Attempted report x1. 

## 2020-08-28 NOTE — Consult Note (Signed)
NEUROLOGY CONSULTATION NOTE   Date of service: August 28, 2020 Patient Name: Kara Griffin MRN:  [id number] DOB:  1935/10/02 Reason for consult: "right basal ganglia stroke" _ _ _   _ __   _ __ _ _  __ __   _ __   __ _  History of Present Illness  Kara Griffin is a 84 y.o. female with PMH significant for DM2, HTN, Myelodysplastic syndrome, HLD who is admitted with dizziness and falls that have been progressively worsening for the last month. She was brought in by family for worsening confusion and now in her bed all day x 2 days.  Workup with CTH without contrast with no acute abnormality. MRI Brain demonstrated a small right basal ganglia lacunar infarct.  Vitals here have been stable, labs with mild hyponatremia, UA with ?uti and yeast but sample dirty specially with many squams. Vit b12 level is 800s.  Neurology was consulted to assist with workup for potential neurological caues of AMS and to assist with stroke workup.  Patient is sleeping on my evaluation.  She does wake up and is oriented to self.  She falls back asleep shortly.  During the later part of exam, she is able to maintain wakefulness.  Patient's son reports that they have been living with the patient since 15 December after she had a fall.  They have noted the patient has had a gradual progressive decline over the course of last few months.  She has been more forgetful, does have tremors and is slow.  She is lucid most of the time but has had some periods of confusion.  He reports that over the last 2 days, she is more confused and appears to have waxing and waning of her cognition with much more prolonged periods of confusion.  She is also been eating less and drinking less over the last few days.  They eventually brought her to the emergency department for evaluation.  Patient denies any prior history of stroke, no family history of stroke, she endorses taking aspirin 81 mg daily at home.  Denies any arm  or leg weakness or numbness, no dysarthria, son reports that when patient is confused, her speech is more mumbling but improves when she is more awake and alert.  Son reports the patient has had some symptoms of gastroenteritis with nausea, has not thrown up and some diarrhea.  No symptoms concerning for urinary tract infection, no symptoms of an upper respiratory infection.  No cuts or wounds on her skin.   ROS   Constitutional Denies weight loss, fever and chills.   HEENT Denies changes in vision and hearing.   Respiratory Denies SOB and cough.   CV Denies palpitations and CP   GI Denies abdominal pain, nausea, vomiting and diarrhea.   GU Denies dysuria and urinary frequency.   MSK Denies myalgia and joint pain.   Skin Denies rash and pruritus.   Neurological Denies headache and syncope.   Psychiatric Denies recent changes in mood. Denies anxiety and depression.    Past History   Past Medical History:  Diagnosis Date  . Anemia   . Diabetes mellitus ([place])   . Environmental allergies   . Hypercholesterolemia   . Hypertension   . Myelodysplastic syndrome ([place])    Sees Dr Jerrye Noble   Past Surgical History:  Procedure Laterality Date  . ABDOMINAL HYSTERECTOMY    . [place]  . DILATION AND CURETTAGE OF UTERUS    .  TONSILECTOMY, ADENOIDECTOMY, BILATERAL MYRINGOTOMY AND TUBES  1963  . TOTAL ABDOMINAL HYSTERECTOMY W/ BILATERAL SALPINGOOPHORECTOMY  1985   secondary to abnormal cells and abnormal uterine bleeding   Family History  Problem Relation Age of Onset  . Heart disease Father        Deceased (MI) - 39  . Diabetes Mother        Deceased  . Thyroid cancer Mother   . Osteoarthritis Mother   . Asthma Other   . Diabetes Brother   . Heart disease Maternal Grandmother        myocardial infarction - 62  . Asthma Maternal Grandmother   . Heart disease Paternal Grandmother        myocardial infarction-52  . Heart disease Brother   . Sinusitis Brother   .  Depression Son   . Diabetes Son   . Depression Son   . Diabetes Son   . Depression Son   . Breast cancer Neg Hx    Social History   Socioeconomic History  . Marital status: Divorced    Spouse name: Not on file  . Number of children: 3  . Years of education: Not on file  . Highest education level: Not on file  Occupational History  . Occupation: retired Games developer  Tobacco Use  . Smoking status: Never Smoker  . Smokeless tobacco: Never Used  Vaping Use  . Vaping Use: Never used  Substance and Sexual Activity  . Alcohol use: No    Alcohol/week: 0.0 standard drinks  . Drug use: No  . Sexual activity: Never  Other Topics Concern  . Not on file  Social History Narrative  . Not on file   Social Determinants of Health   Financial Resource Strain: Not on file  Food Insecurity: Not on file  Transportation Needs: Not on file  Physical Activity: Not on file  Stress: Not on file  Social Connections: Not on file   Allergies  Allergen Reactions  . Ibuprofen     Lowers WBCs    Medications  (Not in a hospital admission)    Vitals   Vitals:   08/08/2020 1810 08/25/2020 2045 08/20/2020 2145  BP: (!) 158/67 134/71 (!) 143/58  Pulse: 88 87 79  Resp: 18 16 17   Temp: 98.2 F (36.8 C)    TempSrc: Oral    SpO2: 98% 100% 100%     There is no height or weight on file to calculate BMI.  Physical Exam   General: Laying comfortably in bed; in no acute distress.  HENT: Normal oropharynx and mucosa. Normal external appearance of ears and nose.  Neck: Supple, no pain or tenderness  CV: No JVD. No peripheral edema.  Pulmonary: Symmetric Chest rise. Normal respiratory effort.  Abdomen: Soft to touch, non-tender.  Ext: No cyanosis, edema, or deformity  Skin: No rash. Normal palpation of skin.   Musculoskeletal: Normal digits and nails by inspection. No clubbing.   Neurologic Examination  Mental status/Cognition: Somnolent, oriented to self, but not to place or month or year.   Poor attention.  Does become more awake during the later part of the exam. Speech/language: Fluent, comprehension intact, object naming intact, repetition intact.  Cranial nerves:   CN II Pupils equal and reactive to light, no VF deficits    CN III,IV,VI EOM intact, no gaze preference or deviation, no nystagmus    CN V normal sensation in V1, V2, and V3 segments bilaterally    CN VII no asymmetry, no  nasolabial fold flattening    CN VIII normal hearing to speech    CN IX & X normal palatal elevation, no uvular deviation    CN XI 5/5 head turn and 5/5 shoulder shrug bilaterally    CN XII midline tongue protrusion    Motor:  Muscle bulk: poor, tone increased with mild cogwheeling and paratonia, pronator drift none tremor yes, intention tremor. Mvmt Root Nerve  Muscle Right Left Comments  SA C5/6 Ax Deltoid 4+ 4+   EF C5/6 Mc Biceps 5 5   EE C6/7/8 Rad Triceps 5 5   WF C6/7 Med FCR 5 5   WE C7/8 PIN ECU 5 5   F Ab C8/T1 U ADM/FDI 5 5   HF L1/2/3 Fem Illopsoas 4 4 Pain in both knees with swelling of BL legs.  KE L2/3/4 Fem Quad 4 4 Pain in both knees with swelling of BL legs  DF L4/5 D Peron Tib Ant 4+ 4+   PF S1/2 Tibial Grc/Sol 4+ 4+    Reflexes:  Right Left Comments  Pectoralis      Biceps (C5/6) 2 2   Brachioradialis (C5/6) 2 2    Triceps (C6/7) 2 2    Patellar (L3/4) 1 1    Achilles (S1) 0 0    Hoffman      Plantar     Jaw jerk    Sensation:  Light touch Intact throughout   Pin prick    Temperature    Vibration   Proprioception    Coordination/Complex Motor:  - Finger to Nose intact BL - Heel to shin unable to do due to weakness and pain in BL knees. - Rapid alternating movement are slowed. - Gait: deferred   NIHSS components Score: Comment  1a Level of Conscious 0[x]  1[]  2[]  3[]      1b LOC Questions 0[]  1[x]  2[]       1c LOC Commands 0[x]  1[]  2[]       2 Best Gaze 0[x]  1[]  2[]       3 Visual 0[x]  1[]  2[]  3[]      4 Facial Palsy 0[x]  1[]  2[]  3[]      5a Motor  Arm - left 0[x]  1[]  2[]  3[]  4[]  UN[]    5b Motor Arm - Right 0[x]  1[]  2[]  3[]  4[]  UN[]    6a Motor Leg - Left 0[]  1[]  2[x]  3[]  4[]  UN[]    6b Motor Leg - Right 0[]  1[]  2[x]  3[]  4[]  UN[]    7 Limb Ataxia 0[x]  1[]  2[]  3[]  UN[]     8 Sensory 0[x]  1[]  2[]  UN[]      9 Best Language 0[x]  1[]  2[]  3[]      10 Dysarthria 0[x]  1[]  2[]  UN[]      11 Extinct. and Inattention 0[x]  1[]  2[]       TOTAL: 5      Labs   CBC:  Recent Labs  Lab 08/21/2020 1814  WBC 3.4*  HGB 11.2*  HCT 34.5*  MCV 89.6  PLT 141*    Basic Metabolic Panel:  Lab Results  Component Value Date   NA 132 (L) 08/19/2020   K 3.5 09/02/2020   CO2 24 08/07/2020   GLUCOSE 161 (H) 08/11/2020   BUN 13 08/31/2020   CREATININE 0.95 08/20/2020   CALCIUM 9.7 09/01/2020   GFRNONAA 60 (L) 08/13/2020   GFRAA >60 04/07/2020   Lipid Panel:  Lab Results  Component Value Date   LDLCALC 31 02/08/2019   HgbA1c:  Lab Results  Component Value Date   HGBA1C 6.7 (  H) 06/17/2020   Urine Drug Screen: No results found for: LABOPIA, COCAINSCRNUR, LABBENZ, AMPHETMU, THCU, LABBARB  Alcohol Level No results found for: [place]  CT Head without contrast: CTH was negative for a large hypodensity concerning for a large territory infarct or hyperdensity concerning for an ICH   MRI Brain: Acute R basal ganglia infarct.  Impression   Arline Ketter is a 84 y.o. female with PMH significant for DM2, HTN, Myelodysplastic syndrome, HLD who is admitted with dizziness and falls that have been progressively worsening for the last month. She was brought in by family for worsening confusion and now in her bed all day x 2 days with poor p.o intake, nausea and diarrhea. Her neurologic examination is notable for waxing and waning encephalopathy and bilateral leg weakness likely due to pain and deconditioning.  MRI brain demonstrated an acute right basal ganglia lacunar infarct. NIHSS of 5. Baseline mRS of 3. No clear LKW.  I think that the noted stroke is  likely incidental and unlikely to explain her waxing and waning encephalopathy.  Recommendations  Plan:  - Frequent Neuro checks per stroke unit protocol - Recommend Vascular imaging with MRA Angio Head without contrast and US Carotid doppler - Recommend obtaining TTE - Recommend obtaining Lipid panel with LDL - Please start statin if LDL > 70 - Recommend HbA1c - Antithrombotic - Aspirin 81mg  daily. - Recommend DVT ppx - SBP goal - permissive hypertension first 24 h < 220/110. Hold home meds.  - Recommend Telemetry monitoring for arrythmia - Recommend bedside swallow screen prior to PO intake. - Stroke education booklet - Recommend PT/OT/SLP consult  ____________________________________________________________________  Thank you for the opportunity to take part in the care of this patient. If you have any further questions, please contact the neurology consultation attending.  Signed,  [place] Pager Number [phone] _ _ _   _ __   _ __ _ _  __ __   _ __   __ _

## 2020-08-28 NOTE — ED Notes (Signed)
Patient transported to CT 

## 2020-08-28 NOTE — Progress Notes (Signed)
STROKE TEAM PROGRESS NOTE   INTERVAL HISTORY Her son Kara Griffin is at the bedside.  Pt was independent until Dec 15 when she fell. Since then, she has had 24h care. Progressive declining health recently with confusion and fatigue. Inttially thought to be a UTI, but it would come and go. Progressive confusion and hallucinations led to current ED visit.  I have personally reviewed history of presenting illness with the patient, her son and electronic medical records and imaging films in PACS.  MRI scan of the brain shows a small right basal ganglia lacunar infarct and MRA of the brain shows no major large vessel stenosis more focal area of left A1 stenosis versus artifact.  LDL cholesterol is 35 mg percent.  Hemoglobin A1c 6.0.  Echocardiogram and carotid ultrasound are pending  Vitals:   08/28/20 0500 08/28/20 0630 08/28/20 0715 08/28/20 0841  BP: (!) 172/83  (!) 118/49 (!) 146/57  Pulse: 92  79 87  Resp: 18  19 (!) 21  Temp:  98.5 F (36.9 C)    TempSrc:  Oral    SpO2: 96%   98%   CBC:  Recent Labs  Lab 08/12/2020 1814  WBC 3.4*  HGB 11.2*  HCT 34.5*  MCV 89.6  PLT [id number]*   Basic Metabolic Panel:  Recent Labs  Lab 08/10/2020 1814  NA 132*  K 3.5  CL 96*  CO2 24  GLUCOSE 161*  BUN 13  CREATININE 0.95  CALCIUM 9.7   Lipid Panel:  Recent Labs  Lab 08/28/20 0455  CHOL 99  TRIG 118  HDL 40*  CHOLHDL 2.5  VLDL 24  LDLCALC 35   HgbA1c:  Recent Labs  Lab 08/28/20 0455  HGBA1C 6.0*   Urine Drug Screen: No results for input(s): LABOPIA, COCAINSCRNUR, LABBENZ, AMPHETMU, THCU, LABBARB in the last 168 hours.  Alcohol Level No results for input(s): ETH in the last 168 hours.  IMAGING past 24 hours MR ANGIO HEAD WO CONTRAST  Result Date: 08/28/2020 CLINICAL DATA:  Stroke follow-up. Acute right basal ganglia infarct on earlier MRI. EXAM: MRA HEAD WITHOUT CONTRAST TECHNIQUE: Angiographic images of the Circle of Willis were obtained using MRA technique without intravenous contrast.  COMPARISON:  None. FINDINGS: The visualized distal vertebral arteries are widely patent to the basilar with the left being strongly dominant. Patent AICA and SCA origins are identified bilaterally. The basilar artery is widely patent. Posterior communicating arteries are not identified and may be diminutive or absent. The PCAs are patent without evidence of a significant proximal stenosis. There are distal cervical internal carotid artery loops bilaterally. The intracranial internal carotid arteries are patent without evidence of significant stenosis allowing for motion artifact through the anterior genu of the left ICA. ACAs and MCAs are patent without evidence of a proximal branch occlusion or significant M1 stenosis. Assessment for branch vessel stenosis is limited by motion. There is a moderate stenosis versus motion artifact in the distal left A1 segment. No aneurysm is identified. IMPRESSION: 1. No large vessel occlusion. 2. Moderate left A1 stenosis versus motion artifact. Electronically Signed   By: Logan Bores M.D.   On: 08/28/2020 03:11   MR BRAIN WO CONTRAST  Result Date: 08/28/2020 CLINICAL DATA:  Dizziness.  Memory loss. EXAM: MRI HEAD WITHOUT CONTRAST TECHNIQUE: Multiplanar, multiecho pulse sequences of the brain and surrounding structures were obtained without intravenous contrast. COMPARISON:  Head CT 08/19/2020 FINDINGS: Brain: There is a 4 mm acute infarct at the anteroinferior aspect of the right basal ganglia. Small  T2 hyperintensities scattered throughout the cerebral white matter bilaterally are nonspecific but compatible with mild chronic small vessel ischemic disease. Two small adjacent chronic hemorrhages are noted in the subcortical white matter of the left frontal lobe. Mild cerebral atrophy is within normal limits for age. Vascular: Major intracranial vascular flow voids are preserved. Skull and upper cervical spine: Unremarkable bone marrow signal. Sinuses/Orbits: Bilateral  cataract extraction. Paranasal sinuses and mastoid air cells are clear. Other: None. IMPRESSION: 1. Acute right basal ganglia lacunar infarct. 2. Mild chronic small vessel ischemic disease. Electronically Signed   By: Logan Bores M.D.   On: 08/28/2020 01:18   DG CHEST PORT 1 VIEW  Result Date: 08/28/2020 CLINICAL DATA:  Fever EXAM: PORTABLE CHEST 1 VIEW COMPARISON:  08/19/2020 FINDINGS: Borderline heart size. Blunting at the lateral left costophrenic sulcus that is stable and most likely from mediastinal fat. Reticulation of lung markings. There is signs of chronic indolent infection/scarring in the lower lungs on recent abdominal CT. There is no edema, consolidation, effusion, or pneumothorax. IMPRESSION: Stable from prior.  No acute finding. Electronically Signed   By: Monte Fantasia M.D.   On: 08/28/2020 07:05   ECHOCARDIOGRAM COMPLETE  Result Date: 08/28/2020    ECHOCARDIOGRAM REPORT   Patient Name:   Benson Setting Date of Exam: 08/28/2020 Medical Rec #:  [id number]              Height:       61.0 in Accession #:    [id number]             Weight:       159.0 lb Date of Birth:  02-01-1936              BSA:          1.713 m Patient Age:    84 years               BP:           118/49 mmHg Patient Gender: F                      HR:           88 bpm. Exam Location:  Inpatient Procedure: 2D Echo, Cardiac Doppler and Color Doppler Indications:    Stroke 434.91 / I163.9  History:        Patient has no prior history of Echocardiogram examinations.                 Risk Factors:Hypertension and Diabetes.  Sonographer:    Bernadene Person RDCS Referring Phys: Gilbert  1. Left ventricular ejection fraction, by estimation, is >75%. The left ventricle has normal function. The left ventricle has no regional wall motion abnormalities. Left ventricular diastolic parameters are consistent with Grade I diastolic dysfunction (impaired relaxation).  2. Right ventricular systolic function  is normal. The right ventricular size is normal. There is mildly elevated pulmonary artery systolic pressure.  3. The mitral valve is normal in structure. No evidence of mitral valve regurgitation. No evidence of mitral stenosis.  4. The aortic valve is tricuspid. There is mild calcification of the aortic valve. There is mild thickening of the aortic valve. Aortic valve regurgitation is not visualized. No aortic stenosis is present.  5. The inferior vena cava is normal in size with greater than 50% respiratory variability, suggesting right atrial pressure of 3 mmHg. FINDINGS  Left Ventricle: Left ventricular ejection fraction, by estimation,  is >75%. The left ventricle has normal function. The left ventricle has no regional wall motion abnormalities. The left ventricular internal cavity size was normal in size. There is no left ventricular hypertrophy. Left ventricular diastolic parameters are consistent with Grade I diastolic dysfunction (impaired relaxation). Normal left ventricular filling pressure. Right Ventricle: The right ventricular size is normal. No increase in right ventricular wall thickness. Right ventricular systolic function is normal. There is mildly elevated pulmonary artery systolic pressure. The tricuspid regurgitant velocity is 2.96  m/s, and with an assumed right atrial pressure of 3 mmHg, the estimated right ventricular systolic pressure is 76.7 mmHg. Left Atrium: Left atrial size was normal in size. Right Atrium: Right atrial size was normal in size. Pericardium: There is no evidence of pericardial effusion. Mitral Valve: The mitral valve is normal in structure. No evidence of mitral valve regurgitation. No evidence of mitral valve stenosis. Tricuspid Valve: The tricuspid valve is normal in structure. Tricuspid valve regurgitation is mild . No evidence of tricuspid stenosis. Aortic Valve: The aortic valve is tricuspid. There is mild calcification of the aortic valve. There is mild thickening  of the aortic valve. There is mild aortic valve annular calcification. Aortic valve regurgitation is not visualized. No aortic stenosis  is present. Aortic valve mean gradient measures 5.4 mmHg. Aortic valve peak gradient measures 11.3 mmHg. Aortic valve area, by VTI measures 2.52 cm. Pulmonic Valve: The pulmonic valve was not well visualized. Pulmonic valve regurgitation is not visualized. No evidence of pulmonic stenosis. Aorta: The aortic root is normal in size and structure. Pulmonary Artery: 35. Venous: The inferior vena cava is normal in size with greater than 50% respiratory variability, suggesting right atrial pressure of 3 mmHg. IAS/Shunts: No atrial level shunt detected by color flow Doppler.  LEFT VENTRICLE PLAX 2D LVIDd:         4.00 cm  Diastology LVIDs:         1.90 cm  LV e' medial:    7.34 cm/s LV PW:         1.00 cm  LV E/e' medial:  11.9 LV IVS:        1.00 cm  LV e' lateral:   6.09 cm/s LVOT diam:     2.10 cm  LV E/e' lateral: 14.4 LV SV:         72 LV SV Index:   42 LVOT Area:     3.46 cm  RIGHT VENTRICLE RV S prime:     18.10 cm/s TAPSE (M-mode): 1.9 cm LEFT ATRIUM             Index       RIGHT ATRIUM           Index LA diam:        2.60 cm 1.52 cm/m  RA Area:     12.40 cm LA Vol (A2C):   32.6 ml 19.03 ml/m RA Volume:   28.30 ml  16.52 ml/m LA Vol (A4C):   42.1 ml 24.57 ml/m LA Biplane Vol: 36.8 ml 21.48 ml/m  AORTIC VALVE AV Area (Vmax):    2.61 cm AV Area (Vmean):   2.72 cm AV Area (VTI):     2.52 cm AV Vmax:           168.33 cm/s AV Vmean:          109.711 cm/s AV VTI:            0.286 m AV Peak Grad:  11.3 mmHg AV Mean Grad:      5.4 mmHg LVOT Vmax:         127.00 cm/s LVOT Vmean:        86.300 cm/s LVOT VTI:          0.208 m LVOT/AV VTI ratio: 0.73  AORTA Ao Root diam: 3.00 cm Ao Asc diam:  3.00 cm MITRAL VALVE                TRICUSPID VALVE MV Area (PHT): 3.77 cm     TR Peak grad:   35.0 mmHg MV Decel Time: 201 msec     TR Vmax:        296.00 cm/s MV E velocity: 87.50 cm/s  MV A velocity: 158.00 cm/s  SHUNTS MV E/A ratio:  0.55         Systemic VTI:  0.21 m                             Systemic Diam: 2.10 cm Carlyle Dolly MD Electronically signed by Carlyle Dolly MD Signature Date/Time: 08/28/2020/8:53:08 AM    Final    CT RENAL STONE STUDY  Result Date: 08/28/2020 CLINICAL DATA:  Flank pain and fevers EXAM: CT ABDOMEN AND PELVIS WITHOUT CONTRAST TECHNIQUE: Multidetector CT imaging of the abdomen and pelvis was performed following the standard protocol without IV contrast. COMPARISON:  08/19/2020 FINDINGS: Lower chest: Lung bases again demonstrates some bronchiectatic changes in the right middle lobe and lingula similar to that seen on prior study. No new focal infiltrate is seen. Hepatobiliary: No focal liver abnormality is seen. No gallstones, gallbladder wall thickening, or biliary dilatation. Pancreas: Unremarkable. No pancreatic ductal dilatation or surrounding inflammatory changes. Spleen: Normal in size without focal abnormality. Adrenals/Urinary Tract: Adrenal glands are within normal limits. Kidneys are well visualized bilaterally. No renal calculi or obstructive changes are seen. Small cyst is noted in the upper pole of the right kidney. The bladder is well distended. Stomach/Bowel: The appendix is not well visualized and may have been surgically removed. No obstructive or inflammatory changes of the large or small bowel are seen. Stomach is decompressed. Vascular/Lymphatic: Aortic atherosclerosis. No enlarged abdominal or pelvic lymph nodes. Reproductive: Status post hysterectomy. No adnexal masses. Other: No abdominal wall hernia or abnormality. No abdominopelvic ascites. Musculoskeletal: No acute or significant osseous findings. IMPRESSION: Changes similar to that seen on prior exam. No acute abnormality is noted. Electronically Signed   By: Inez Catalina M.D.   On: 08/28/2020 09:15   US Abdomen Limited RUQ (LIVER/GB)  Result Date: 08/28/2020 CLINICAL DATA:   Nausea and vomiting. EXAM: ULTRASOUND ABDOMEN LIMITED RIGHT UPPER QUADRANT COMPARISON:  CT scan 08/19/2020 FINDINGS: Gallbladder: No gallstones or gallbladder wall thickening. No pericholecystic fluid. The sonographer reports no sonographic Murphy's sign. Common bile duct: Diameter: 1-2 mm Liver: No focal lesion identified. Within normal limits in parenchymal echogenicity. Portal vein is patent on color Doppler imaging with normal direction of blood flow towards the liver. Other: None. IMPRESSION: No acute findings. No evidence to explain the patient's history of nausea and vomiting. Electronically Signed   By: Misty Stanley M.D.   On: 08/28/2020 08:04    PHYSICAL EXAM Pleasant elderly Caucasian lady not in distress. . Afebrile. Head is nontraumatic. Neck is supple without bruit.    Cardiac exam no murmur or gallop. Lungs are clear to auscultation. Distal pulses are well felt. Neurological Exam  : She is awake alert she  is oriented to person only.  Diminished attention, registration and recall.  Poor insight into her condition.  He can follow simple 1 and occasional two-step commands.  Speech is normal.  No dysarthria or aphasia.  Extraocular movements are full range without nystagmus.  Blinks to threat bilaterally.  Face is symmetric without weakness.  Tongue midline.  Motor system exam shows no upper extremity drift with symmetric strength without focal weakness.  Bilateral lower extremity weakness with 3/5 left lower and 2/5 right lower extremity strength with poor effort.  Sensation appears preserved bilaterally.  Plantars were downgoing.  Gait not tested. ASSESSMENT/PLAN Ms. Seleny Smullen is a 84 y.o. female with history of DM2, HTN, Myelodysplastic syndrome, HLD presenting with worsening dizziness and falls over past 1 month, waxing and waning confusion along with hallucinations.    Stroke:   R basal ganglia infarct secondary to small vessel disease .  Patient has had subacute functional  cognitive decline likely suspect underlying mild cognitive impairment worsened by ongoing urinary tract infection and stroke  MRI  Acute small R basal ganglia lacunar infarct (couple of days old). Small vessel disease.   MRA  No LVO. Moderate L A1 stenosis   Carotid Doppler  pending   2D Echo EF 75%. No source of embolus   LDL 35  HgbA1c 6.0  VTE prophylaxis - SCDs     Diet   Diet NPO time specified    aspirin 81 mg daily prior to admission, now on aspirin 81 mg daily or 300 suppository depending on swallow function (SLP seeing her now).   Therapy recommendations:  pending   Disposition:  pending   Hypertension  Stable . BP goal normotensive  Hyperlipidemia  Home meds:  crestor 10, resumed in hospital  LDL 35, goal < 70  Continue statin at discharge  Diabetes type II Uncontrolled  HgbA1c 6.0, goal < 7.0  Other Stroke Risk Factors  Advanced Age >/= 98   Obesity, There is no height or weight on file to calculate BMI., BMI >/= 30 associated with increased stroke risk, recommend weight loss, diet and exercise as appropriate   Other Active Problems  Myelodysplastic syndrome  Hospital day # 0  Recommend speech therapy for swallow eval and if she is able to swallow safely aspirin 81 and Plavix 75 mg daily for 3 weeks followed by aspirin alone.  Continue ongoing stroke work-up.  Check vitamin B12, TSH and RPR and EEG for reversible causes of cognitive impairment.  Physical occupational therapy and speech therapy for cognitive eval.  Long discussion with the patient and son at the bedside and answered questions.  Discussed with Dr. Early Osmond.  Greater than 50% time during this 35-minute visit was spent in counseling and coordination of care about her lacunar infarct and cognitive impairment and answering questions about her care.   Antony Contras, MD   To contact Stroke Continuity provider, please refer to [email or web address] After hours, contact General Neurology

## 2020-08-28 NOTE — Progress Notes (Signed)
   08/28/20 1002  SLP Visit Information  SLP Received On 08/28/20  General Information  HPI Kara Griffin is a 84 y.o. female with PMH significant for DM2, HTN, Myelodysplastic syndrome, HLD who is admitted with dizziness and falls that have been progressively worsening for the last month. She was brought in by family for worsening confusion and now in her bed all day x 2 days.Son reports the patient has had some symptoms of gastroenteritis with nausea, has not thrown up and some diarrhea. MRI brain demonstrated an acute right basal ganglia lacunar infarct. Neurologist feels Stroke is an incidental finding, unrelated to waxing and waning mentation.  Type of Study Bedside Swallow Evaluation  Diet Prior to this Study NPO  Temperature Spikes Noted No  Respiratory Status Room air  History of Recent Intubation No  Behavior/Cognition Alert;Cooperative;Pleasant mood  Oral Cavity Assessment WFL  Oral Care Completed by SLP No  Oral Cavity - Dentition Adequate natural dentition  Vision Functional for self-feeding  Self-Feeding Abilities Able to feed self  Patient Positioning Upright in bed  Baseline Vocal Quality Normal  Volitional Cough Strong  Volitional Swallow Able to elicit  Pain Assessment  Pain Assessment Faces  Faces Pain Scale 6  Pain Location low back with mobility (Son reports pt with c/o back pain since 12/15 fall. Xray negative 12/15.)  Pain Descriptors / Indicators Guarding;Moaning;Grimacing;Discomfort  Pain Intervention(s) Monitored during session;Limited activity within patient's tolerance;Repositioned  Oral Motor/Sensory Function  Overall Oral Motor/Sensory Function WFL  Thin Liquid  Thin Liquid WFL  Presentation Cup;Straw  Nectar Thick Liquid  Nectar Thick Liquid NT  Honey Thick Liquid  Honey Thick Liquid NT  Puree  Puree WFL  Solid  Solid [facility]  SLP Assessment  Clinical Impression Statement (ACUTE ONLY) Pt demonstrates no signs of aspiration, tolerates regular  textured solids and thin liquids well. Pt and son do reports increased complaint of acid refulx recently as well as occasional globus with pills. Suggested to son they consider f/u with gasteroenterologist as an OP to address these concerns. Will starte a regular diet and thin liquids and sign off  SLP Visit Diagnosis Dysphagia, unspecified (R13.10)  Swallow Evaluation Recommendations  SLP Diet Recommendations Regular;Thin liquid  Liquid Administration via Cup;Straw  Medication Administration Whole meds with liquid  Supervision Patient able to self feed  Compensations Slow rate;Small sips/bites  Postural Changes Seated upright at 90 degrees  Treatment Plan  Treatment Recommendations No treatment recommended at this time  Individuals Consulted  Consulted and Agree with Results and Recommendations Patient;Family member/caregiver;RN  SLP Time Calculation  SLP Start Time (ACUTE ONLY) 0940  SLP Stop Time (ACUTE ONLY) 1000  SLP Time Calculation (min) (ACUTE ONLY) 20 min  SLP Evaluations  $ SLP Speech Visit 1 Visit  SLP Evaluations  $BSS Swallow 1 Procedure

## 2020-08-28 NOTE — Procedures (Signed)
Patient Name: Kara Griffin  MRN: [id number]  Epilepsy Attending: Lora Havens  Referring Physician/Provider: Dr Antony Contras Date: 08/28/2020 Duration: 24.43 mins  Patient history: 84yo F with R basal ganglia infarct and cognitive impairment. EEG to evaluate for seizure.   Level of alertness: Awake  AEDs during EEG study: None  Technical aspects: This EEG study was done with scalp electrodes positioned according to the 10-20 International system of electrode placement. Electrical activity was acquired at a sampling rate of 500Hz  and reviewed with a high frequency filter of 70Hz  and a low frequency filter of 1Hz . EEG data were recorded continuously and digitally stored.   Description: The posterior dominant rhythm consists of 8 Hz activity of moderate voltage (25-35 uV) seen predominantly in posterior head regions, symmetric and reactive to eye opening and eye closing. EEG showed intermittent generalized 3 to 6 Hz theta-delta slowing.  Hyperventilation and photic stimulation were not performed.     ABNORMALITY -Intermittent slow, generalized  IMPRESSION: This study is suggestive of mild diffuse encephalopathy, nonspecific etiology. No seizures or epileptiform discharges were seen throughout the recording.   Kara Griffin

## 2020-08-28 NOTE — Evaluation (Signed)
Physical Therapy Evaluation Patient Details Name: Kara Griffin MRN: [id number] DOB: 1935-12-28 Today's Date: 08/28/2020   History of Present Illness  84 y.o. female with history of myelodysplastic syndrome being followed by Dr. Lowell Guitar at [facility] on [place], pancytopenia, diabetes mellitus, hypertension and hyperlipidemia was brought to the ER by patient's son after patient was found to be increasingly confused last 2 days. On August 19, 2020, patient was taken to [facility] after patient had a fall at home. CT and xray were negative during that ED visit and pt was d/c'd home. Pt underwent MRI 12/23 which revealed small R bagal ganglia lacunar infarct.    Clinical Impression  Pt admitted with above diagnosis. On eval, pt required +2 mod/max assist bed mobility, +2 mod assist transfers, and mod assist +2 safety/equipment ambulation 12' with SPC. She presents with deficits in cognition, balance, activity tolerance, and strength.  Pt currently with functional limitations due to the deficits listed below (see PT Problem List). Pt will benefit from skilled PT to increase their independence and safety with mobility to allow discharge to the venue listed below. If pt/family declines SNF and pt d/c's home, she will need 24-hour assist, HHPT, and below noted DME.      Follow Up Recommendations SNF;Supervision/Assistance - 24 hour    Equipment Recommendations  Wheelchair (measurements PT);Wheelchair cushion (measurements PT);Hospital bed (if pt d/c's home)    Recommendations for Other Services       Precautions / Restrictions Precautions Precautions: Fall      Mobility  Bed Mobility Overal bed mobility: Needs Assistance Bed Mobility: Rolling;Sidelying to Sit;Sit to Sidelying Rolling: +2 for physical assistance;Mod assist Sidelying to sit: +2 for physical assistance;Max assist     Sit to sidelying: Mod assist;+2 for physical assistance General bed mobility comments: logroll for  comfort due to back pain. Pt resistive initially due to pain. Pt with report of pain relief upon sitting. Assist with all aspects of mobility. Decreased initiation. Increased time to complete.    Transfers Overall transfer level: Needs assistance Equipment used: Straight cane;1 person hand held assist Transfers: Sit to/from Stand Sit to Stand: +2 physical assistance;Mod assist;From elevated surface         General transfer comment: assist to power up and stabilize balance  Ambulation/Gait Ambulation/Gait assistance: +2 safety/equipment;Mod assist Gait Distance (Feet): 12 Feet Assistive device: Straight cane;1 person hand held assist Gait Pattern/deviations: Step-through pattern;Decreased stride length;Shuffle Gait velocity: decreased Gait velocity interpretation: <1.8 ft/sec, indicate of risk for recurrent falls General Gait Details: Slow, shuffle steps. Unsteady gait. SPC R hand and HHA on L with close body contact with therapist to provide increased support. +2 safety/IV pole.  Stairs            Wheelchair Mobility    Modified Rankin (Stroke Patients Only) Modified Rankin (Stroke Patients Only) Pre-Morbid Rankin Score: No significant disability Modified Rankin: Moderately severe disability     Balance Overall balance assessment: Needs assistance Sitting-balance support: Feet supported;No upper extremity supported Sitting balance-Leahy Scale: Fair     Standing balance support: Bilateral upper extremity supported;During functional activity Standing balance-Leahy Scale: Poor Standing balance comment: reliant on external support                             Pertinent Vitals/Pain Pain Assessment: Faces Faces Pain Scale: Hurts even more Pain Location: low back with mobility (Son reports pt with c/o back pain since 12/15 fall. Xray negative 12/15.) Pain  Descriptors / Indicators: Guarding;Moaning;Grimacing;Discomfort Pain Intervention(s): Monitored during  session;Limited activity within patient's tolerance;Repositioned    Home Living Family/patient expects to be discharged to:: Private residence Living Arrangements: Alone Available Help at Discharge: Family;Available PRN/intermittently (possibly able to provide 24-hour assist) Type of Home: House Home Access: Stairs to enter Entrance Stairs-Rails: Left Entrance Stairs-Number of Steps: 4 Home Layout: One level Home Equipment: Walker - 2 wheels;Cane - single point;Shower seat Additional Comments: Family is in the process of building a ramp. Comfort height toilets were just installed. Family is also currently getting quotes to convert tub/shower to walk-in shower.    Prior Function Level of Independence: Independent with assistive device(s)         Comments: Prior to 12/15, pt mod I using SPC. Since 12/15, family has been staying with pt providing 24-hour care. Assist has been required for mobility and all ADLs. And pt ambulating with RW.     Hand Dominance   Dominant Hand: Right    Extremity/Trunk Assessment   Upper Extremity Assessment Upper Extremity Assessment: Defer to OT evaluation    Lower Extremity Assessment Lower Extremity Assessment: Generalized weakness    Cervical / Trunk Assessment Cervical / Trunk Assessment: Kyphotic  Communication   Communication: HOH;Receptive difficulties;Expressive difficulties  Cognition Arousal/Alertness: Awake/alert Behavior During Therapy: WFL for tasks assessed/performed Overall Cognitive Status: Impaired/Different from baseline Area of Impairment: Orientation;Attention;Memory;Following commands;Safety/judgement;Awareness;Problem solving                 Orientation Level: Disoriented to;Place;Time;Situation (Pt knew she was in the hospital but thought she was in [place]. Stated the year as 1961.) Current Attention Level: Sustained Memory: Decreased short-term memory Following Commands: Follows one step commands  inconsistently;Follows one step commands with increased time Safety/Judgement: Decreased awareness of safety;Decreased awareness of deficits Awareness: Intellectual Problem Solving: Slow processing;Decreased initiation;Difficulty sequencing;Requires verbal cues;Requires tactile cues        General Comments General comments (skin integrity, edema, etc.): VSS on RA    Exercises     Assessment/Plan    PT Assessment Patient needs continued PT services  PT Problem List Decreased strength;Decreased mobility;Decreased safety awareness;Decreased knowledge of precautions;Decreased activity tolerance;Decreased cognition;Pain;Decreased balance;Decreased knowledge of use of DME       PT Treatment Interventions DME instruction;Therapeutic exercise;Gait training;Balance training;Functional mobility training;Therapeutic activities;Patient/family education;Cognitive remediation    PT Goals (Current goals can be found in the Care Plan section)  Acute Rehab PT Goals Patient Stated Goal: home per son PT Goal Formulation: With patient/family Time For Goal Achievement: 09/11/20 Potential to Achieve Goals: Good    Frequency Min 3X/week   Barriers to discharge        Co-evaluation               AM-PAC PT "6 Clicks" Mobility  Outcome Measure Help needed turning from your back to your side while in a flat bed without using bedrails?: A Lot Help needed moving from lying on your back to sitting on the side of a flat bed without using bedrails?: A Lot Help needed moving to and from a bed to a chair (including a wheelchair)?: A Lot Help needed standing up from a chair using your arms (e.g., wheelchair or bedside chair)?: A Lot Help needed to walk in hospital room?: A Lot Help needed climbing 3-5 steps with a railing? : Total 6 Click Score: 11    End of Session Equipment Utilized During Treatment: Gait belt Activity Tolerance: Patient tolerated treatment well Patient left: in bed;with call  bell/phone within  reach;with family/visitor present Nurse Communication: Mobility status PT Visit Diagnosis: Unsteadiness on feet (R26.81);Other abnormalities of gait and mobility (R26.89);Muscle weakness (generalized) (M62.81);Pain    Time: 0902-0932 PT Time Calculation (min) (ACUTE ONLY): 30 min   Charges:   PT Evaluation $PT Eval Moderate Complexity: 1 Mod          Lorrin Goodell, PT  Office # [phone] Pager [phone]   Lorriane Shire 08/28/2020, 10:03 AM

## 2020-08-29 ENCOUNTER — Inpatient Hospital Stay (HOSPITAL_COMMUNITY): Payer: Medicare PPO

## 2020-08-29 DIAGNOSIS — E1165 Type 2 diabetes mellitus with hyperglycemia: Secondary | ICD-10-CM | POA: Diagnosis not present

## 2020-08-29 DIAGNOSIS — G9340 Encephalopathy, unspecified: Secondary | ICD-10-CM | POA: Diagnosis not present

## 2020-08-29 LAB — URINE CULTURE

## 2020-08-29 LAB — GLUCOSE, CAPILLARY
Glucose-Capillary: 143 mg/dL — ABNORMAL HIGH (ref 70–99)
Glucose-Capillary: 165 mg/dL — ABNORMAL HIGH (ref 70–99)
Glucose-Capillary: 127 mg/dL — ABNORMAL HIGH (ref 70–99)
Glucose-Capillary: 144 mg/dL — ABNORMAL HIGH (ref 70–99)

## 2020-08-29 LAB — CBC
HCT: 26.5 % — ABNORMAL LOW (ref 36.0–46.0)
Hemoglobin: 9.3 g/dL — ABNORMAL LOW (ref 12.0–15.0)
MCH: 30.9 pg (ref 26.0–34.0)
MCHC: 35.1 g/dL (ref 30.0–36.0)
MCV: 88 fL (ref 80.0–100.0)
Platelets: 115 10*3/uL — ABNORMAL LOW (ref 150–400)
RBC: 3.01 MIL/uL — ABNORMAL LOW (ref 3.87–5.11)
RDW: 15.9 % — ABNORMAL HIGH (ref 11.5–15.5)
WBC: 2.4 10*3/uL — ABNORMAL LOW (ref 4.0–10.5)
nRBC: 0 % (ref 0.0–0.2)

## 2020-08-29 LAB — MAGNESIUM: Magnesium: 1.6 mg/dL — ABNORMAL LOW (ref 1.7–2.4)

## 2020-08-29 LAB — BASIC METABOLIC PANEL
Anion gap: 11 (ref 5–15)
BUN: 9 mg/dL (ref 8–23)
CO2: 21 mmol/L — ABNORMAL LOW (ref 22–32)
Calcium: 8.2 mg/dL — ABNORMAL LOW (ref 8.9–10.3)
Chloride: 101 mmol/L (ref 98–111)
Creatinine, Ser: 0.76 mg/dL (ref 0.44–1.00)
GFR, Estimated: >60 mL/min (ref >60)
Glucose, Bld: 141 mg/dL — ABNORMAL HIGH (ref 70–99)
Potassium: 3.1 mmol/L — ABNORMAL LOW (ref 3.5–5.1)
Sodium: 133 mmol/L — ABNORMAL LOW (ref 135–145)

## 2020-08-29 MED ORDER — SODIUM CHLORIDE 0.9 % IV SOLN
1.0000 g | Freq: Every day | INTRAVENOUS | Status: DC
  Administered 2020-08-29: 21:00:00 1 g via INTRAVENOUS
  Filled 2020-08-29: qty 10

## 2020-08-29 MED ORDER — ALUM & MAG HYDROXIDE-SIMETH 200-200-20 MG/5ML PO SUSP
30.0000 mL | Freq: Once | ORAL | Status: AC
  Administered 2020-08-29: 23:00:00 30 mL via ORAL
  Filled 2020-08-29: qty 30

## 2020-08-29 MED ORDER — ONDANSETRON HCL 4 MG/2ML IJ SOLN
4.0000 mg | Freq: Four times a day (QID) | INTRAMUSCULAR | Status: DC | PRN
  Administered 2020-08-29 – 2020-08-31 (×3): 4 mg via INTRAVENOUS
  Filled 2020-08-29 (×4): qty 2

## 2020-08-29 MED ORDER — POTASSIUM CHLORIDE 20 MEQ PO PACK
40.0000 meq | PACK | Freq: Two times a day (BID) | ORAL | Status: AC
  Administered 2020-08-29 (×2): 40 meq via ORAL
  Filled 2020-08-29 (×2): qty 2

## 2020-08-29 NOTE — Plan of Care (Signed)
  Problem: Education: Goal: Knowledge of disease or condition will improve Outcome: Progressing Goal: Knowledge of secondary prevention will improve Outcome: Progressing Goal: Knowledge of patient specific risk factors addressed and post discharge goals established will improve Outcome: Progressing   Problem: Coping: Goal: Will verbalize positive feelings about self Outcome: Progressing Goal: Will identify appropriate support needs Outcome: Progressing   Problem: Health Behavior/Discharge Planning: Goal: Ability to manage health-related needs will improve Outcome: Progressing   Problem: Self-Care: Goal: Ability to participate in self-care as condition permits will improve Outcome: Progressing Goal: Verbalization of feelings and concerns over difficulty with self-care will improve Outcome: Progressing Goal: Ability to communicate needs accurately will improve Outcome: Progressing   Problem: Nutrition: Goal: Risk of aspiration will decrease Outcome: Progressing   Problem: Intracerebral Hemorrhage Tissue Perfusion: Goal: Complications of Intracerebral Hemorrhage will be minimized Outcome: Progressing   Problem: Spontaneous Subarachnoid Hemorrhage Tissue Perfusion: Goal: Complications of Spontaneous Subarachnoid Hemorrhage will be minimized Outcome: Progressing   

## 2020-08-29 NOTE — Progress Notes (Signed)
STROKE TEAM PROGRESS NOTE   INTERVAL HISTORY Patient is sitting up in bed.  Her son is at the bedside.  She is doing well.  She has no complaints.  Neurological exam unchanged.  Vital signs stable.  2D echo shows normal ejection fraction without cardiac source of embolism.  Carotid ultrasound shows no significant extracranial carotid stenosis.  Therapy recommendations are for rehab illness skilled nursing setting EEGs suggest mild diffuse onset encephalopathy.  No seizures noted.  Vitamin B12, TSH were normal and RPR was negative Vitals:   08/28/20 1503 08/28/20 2034 08/28/20 2344 08/29/20 0354  BP: (!) 129/54 (!) 139/50 136/62 134/64  Pulse: 79 87 78 76  Resp: 20  16 16   Temp: 98.2 F (36.8 C) 98.6 F (37 C) 98.7 F (37.1 C) 98.5 F (36.9 C)  TempSrc: Oral Oral Oral Oral  SpO2: 98% 98% 99% 97%   CBC:  Recent Labs  Lab 08/16/2020 1814 08/29/20 0324  WBC 3.4* 2.4*  HGB 11.2* 9.3*  HCT 34.5* 26.5*  MCV 89.6 88.0  PLT 141* [id number]*   Basic Metabolic Panel:  Recent Labs  Lab 08/12/2020 1814 08/29/20 0324  NA 132* 133*  K 3.5 3.1*  CL 96* 101  CO2 24 21*  GLUCOSE 161* 141*  BUN 13 9  CREATININE 0.95 0.76  CALCIUM 9.7 8.2*   Lipid Panel:  Recent Labs  Lab 08/28/20 0455  CHOL 99  TRIG 118  HDL 40*  CHOLHDL 2.5  VLDL 24  LDLCALC 35   HgbA1c:  Recent Labs  Lab 08/28/20 0455  HGBA1C 6.0*   Urine Drug Screen: No results for input(s): LABOPIA, COCAINSCRNUR, LABBENZ, AMPHETMU, THCU, LABBARB in the last 168 hours.  Alcohol Level No results for input(s): ETH in the last 168 hours.  IMAGING past 24 hours EEG adult  Result Date: 08/28/2020 Lora Havens, MD     08/28/2020 12:15 PM Patient Name: Darene Steimer MRN: [id number] Epilepsy Attending: Lora Havens Referring Physician/Provider: Dr Antony Contras Date: 08/28/2020 Duration: 24.43 mins Patient history: 84yo F with R basal ganglia infarct and cognitive impairment. EEG to evaluate for seizure. Level of  alertness: Awake AEDs during EEG study: None Technical aspects: This EEG study was done with scalp electrodes positioned according to the 10-20 International system of electrode placement. Electrical activity was acquired at a sampling rate of 500Hz  and reviewed with a high frequency filter of 70Hz  and a low frequency filter of 1Hz . EEG data were recorded continuously and digitally stored. Description: The posterior dominant rhythm consists of 8 Hz activity of moderate voltage (25-35 uV) seen predominantly in posterior head regions, symmetric and reactive to eye opening and eye closing. EEG showed intermittent generalized 3 to 6 Hz theta-delta slowing.  Hyperventilation and photic stimulation were not performed.   ABNORMALITY -Intermittent slow, generalized IMPRESSION: This study is suggestive of mild diffuse encephalopathy, nonspecific etiology. No seizures or epileptiform discharges were seen throughout the recording. Lora Havens   CT RENAL STONE STUDY  Result Date: 08/28/2020 CLINICAL DATA:  Flank pain and fevers EXAM: CT ABDOMEN AND PELVIS WITHOUT CONTRAST TECHNIQUE: Multidetector CT imaging of the abdomen and pelvis was performed following the standard protocol without IV contrast. COMPARISON:  08/19/2020 FINDINGS: Lower chest: Lung bases again demonstrates some bronchiectatic changes in the right middle lobe and lingula similar to that seen on prior study. No new focal infiltrate is seen. Hepatobiliary: No focal liver abnormality is seen. No gallstones, gallbladder wall thickening, or biliary dilatation. Pancreas: Unremarkable. No  pancreatic ductal dilatation or surrounding inflammatory changes. Spleen: Normal in size without focal abnormality. Adrenals/Urinary Tract: Adrenal glands are within normal limits. Kidneys are well visualized bilaterally. No renal calculi or obstructive changes are seen. Small cyst is noted in the upper pole of the right kidney. The bladder is well distended. Stomach/Bowel:  The appendix is not well visualized and may have been surgically removed. No obstructive or inflammatory changes of the large or small bowel are seen. Stomach is decompressed. Vascular/Lymphatic: Aortic atherosclerosis. No enlarged abdominal or pelvic lymph nodes. Reproductive: Status post hysterectomy. No adnexal masses. Other: No abdominal wall hernia or abnormality. No abdominopelvic ascites. Musculoskeletal: No acute or significant osseous findings. IMPRESSION: Changes similar to that seen on prior exam. No acute abnormality is noted. Electronically Signed   By: Inez Catalina M.D.   On: 08/28/2020 09:15   VAS US CAROTID (at [facility] and [facility] only)  Result Date: 08/29/2020 Carotid Arterial Duplex Study Indications:  Recent fall with progressive decline of mental status. Risk Factors: Hypertension, hyperlipidemia, Diabetes. Performing Technologist: Rogelia Rohrer  Examination Guidelines: A complete evaluation includes B-mode imaging, spectral Doppler, color Doppler, and power Doppler as needed of all accessible portions of each vessel. Bilateral testing is considered an integral part of a complete examination. Limited examinations for reoccurring indications may be performed as noted.  Right Carotid Findings: +----------+--------+--------+--------+------------------+--------+           PSV cm/sEDV cm/sStenosisPlaque DescriptionComments +----------+--------+--------+--------+------------------+--------+ CCA Prox  97      5                                          +----------+--------+--------+--------+------------------+--------+ CCA Distal122     28                                         +----------+--------+--------+--------+------------------+--------+ ICA Prox  87      23      1-39%                              +----------+--------+--------+--------+------------------+--------+ ECA       112     0                                           +----------+--------+--------+--------+------------------+--------+ +----------+--------+-------+----------------+-------------------+           PSV cm/sEDV cmsDescribe        Arm Pressure (mmHG) +----------+--------+-------+----------------+-------------------+ [id number]     0      Multiphasic, WNL                    +----------+--------+-------+----------------+-------------------+ +---------+--------+--+--------+--+---------+ VertebralPSV cm/s58EDV cm/s10Antegrade +---------+--------+--+--------+--+---------+  Left Carotid Findings: +----------+--------+--------+--------+------------------+--------+           PSV cm/sEDV cm/sStenosisPlaque DescriptionComments +----------+--------+--------+--------+------------------+--------+ CCA Prox  104     0                                          +----------+--------+--------+--------+------------------+--------+ CCA Distal100     12                                         +----------+--------+--------+--------+------------------+--------+  ICA Prox  53      12      1-39%                              +----------+--------+--------+--------+------------------+--------+ ICA Distal77      17                                         +----------+--------+--------+--------+------------------+--------+ ECA       63      0                                          +----------+--------+--------+--------+------------------+--------+ +----------+--------+--------+----------------+-------------------+           PSV cm/sEDV cm/sDescribe        Arm Pressure (mmHG) +----------+--------+--------+----------------+-------------------+ [id number]      0       Multiphasic, WNL                    +----------+--------+--------+----------------+-------------------+ +---------+--------+--+--------+--+---------+ VertebralPSV cm/s65EDV cm/s17Antegrade +---------+--------+--+--------+--+---------+   Summary: Right  Carotid: Velocities in the right ICA are consistent with a 1-39% stenosis. Left Carotid: Velocities in the left ICA are consistent with a 1-39% stenosis. Vertebrals:  Bilateral vertebral arteries demonstrate antegrade flow. Subclavians: Normal flow hemodynamics were seen in bilateral subclavian              arteries. *See table(s) above for measurements and observations.  Electronically signed by Servando Snare MD on 08/29/2020 at 7:59:00 AM.    Final     PHYSICAL EXAM   Pleasant elderly Caucasian lady not in distress. . Afebrile. Head is nontraumatic. Neck is supple without bruit.    Cardiac exam no murmur or gallop. Lungs are clear to auscultation. Distal pulses are well felt. Neurological Exam  : She is awake alert she is oriented to person only.  Diminished attention, registration and recall.  Poor insight into her condition.  He can follow simple 1 and occasional two-step commands.  Speech is normal.  No dysarthria or aphasia.  Extraocular movements are full range without nystagmus.  Blinks to threat bilaterally.  Face is symmetric without weakness.  Tongue midline.  Motor system exam shows no upper extremity drift with symmetric strength without focal weakness.  Bilateral lower extremity weakness with 3/5 left lower and 2/5 right lower extremity strength with poor effort.  Sensation appears preserved bilaterally.  Plantars were downgoing.  Gait not tested.   ASSESSMENT/PLAN Ms. Yoshiye Kraft is a 84 y.o. female with history of DM2, HTN, Myelodysplastic syndrome, and HLD presenting with worsening dizziness and falls over past 1 month, waxing and waning confusion along with hallucinations.    Stroke:   R basal ganglia infarct secondary to small vessel disease .  Patient has had subacute functional cognitive decline likely suspect underlying mild cognitive impairment worsened by ongoing urinary tract infection and stroke  MRI  Acute small R basal ganglia lacunar infarct (couple of days old).  Small vessel disease.   MRA  No LVO. Moderate L A1 stenosis   Carotid Doppler - unremarkable  2D Echo EF 75%. No source of embolus   EEG - Intermittent slow, generalized - suggestive of mild diffuse encephalopathy, nonspecific etiology. No seizures or epileptiform discharges were seen throughout the  recording.  LDL 35  HgbA1c 6.0  VTE prophylaxis - SCDs     Diet   Diet regular Room service appropriate? Yes; Fluid consistency: Thin    aspirin 81 mg daily prior to admission, now on aspirin 81 mg daily or 300 suppository depending on swallow function (SLP seeing her now). ( If she is able to swallow safely aspirin 81 and Plavix 75 mg daily for 3 weeks followed by aspirin alone )  Therapy recommendations: [place] placement recommended by therapists  Disposition:  pending   Hypertension  Stable . BP goal normotensive  Hyperlipidemia  Home meds:  Crestor 10, resumed in hospital  LDL 35, goal < 70  Continue statin at discharge  Diabetes type II Uncontrolled  HgbA1c 6.0, goal < 7.0  Other Stroke Risk Factors  Advanced Age >/= 53   Obesity, There is no height or weight on file to calculate BMI., BMI >/= 30 associated with increased stroke risk, recommend weight loss, diet and exercise as appropriate   Other Active Problems, Findings and Recommendations  Myelodysplastic syndrome  Hypokalemia - 3.1 - supplemented - recheck tomorrow  EEG - Intermittent slow, generalized - suggestive of mild diffuse encephalopathy, nonspecific etiology. No seizures or epileptiform discharges were seen throughout the recording.   If she is able to swallow safely aspirin 81 and Plavix 75 mg daily for 3 weeks followed by aspirin alone    B12 - 843 (wnl) ; TSH - 1.571 (wnl) ; RPR - non reactive  Suspected sepsis - Blood Cultures - no growth < 12 hrs - Cefepime and Vancomycin started 12/24 (afebrile) (WBC's 2.4)   Mild thrombocytopenia - platelets - [facility] day #  1  Recommend aspirin Plavix for 3 weeks followed by aspirin alone and aggressive risk factor modification.  Transfer to skilled nursing facility for rehabilitation over the next few days.  Long discussion with patient and son at the bedside and answered questions.  Greater than 50% time during this 25-minute visit was spent in counseling and coordination of care about her lacunar stroke and cognitive impairment and answering questions.  Follow-up as an outpatient stroke clinic in 6 weeks.  Discussed with Dr. Early Osmond.  Stroke team will sign off.  Kindly call for questions  Antony Contras, MD  To contact Stroke Continuity provider, please refer to [email or web address] After hours, contact General Neurology

## 2020-08-29 NOTE — TOC Initial Note (Addendum)
Transition of Care [facility]) - Initial/Assessment Note    Patient Details  Name: Kara Griffin MRN: [id number] Date of Birth: 1936/01/20  [facility] CM/SW Contact:    Coralee Pesa, [place] Phone Number: 08/29/2020, 11:54 AM  Clinical Narrative:                 CSW spoke with pt and son, Earley Favor at bedside. CSW discussed SNF recommendation and pt and family are agreeable. They have no preference, but would like to be out toward [place]. She has had both Pfizers and the booster. CSW will complete workup and faxout, but unable to determine if pt is managed through [facility] or [employer] due to them being closed for the holiday.  Expected Discharge Plan: Skilled Nursing Facility Barriers to Discharge: SNF Pending bed offer,Insurance Authorization,Continued Medical Work up   Patient Goals and CMS Choice Patient states their goals for this hospitalization and ongoing recovery are:: Pt and son agreeable to SNF placement. CMS Medicare.gov Compare Post Acute Care list provided to:: Patient Choice offered to / list presented to : Patient  Expected Discharge Plan and Services Expected Discharge Plan: [place] Choice: [place] arrangements for the past 2 months: Single Family Home                                      Prior Living Arrangements/Services Living arrangements for the past 2 months: Single Family Home Lives with:: Self Patient language and need for interpreter reviewed:: Yes Do you feel safe going back to the place where you live?: Yes      Need for Family Participation in Patient Care: Yes (Comment) Care giver support system in place?: Yes (comment)   Criminal Activity/Legal Involvement Pertinent to Current Situation/Hospitalization: No - Comment as needed  Activities of Daily Living      Permission Sought/Granted Permission sought to share information with : Family Supports Permission  granted to share information with : Yes, Verbal Permission Granted  Share Information with NAME: Xavier Fournier [phone]     Permission granted to share info w Relationship: son  Permission granted to share info w Contact Information: [phone]  Emotional Assessment Appearance:: Appears stated age Attitude/Demeanor/Rapport: Engaged Affect (typically observed): Pleasant Orientation: : Oriented to Self,Oriented to Place,Oriented to  Time,Oriented to Situation Alcohol / Substance Use: Not Applicable Psych Involvement: No (comment)  Admission diagnosis:  Nausea & vomiting [R11.2] Fever [R50.9] Acute encephalopathy [G93.40] Altered mental status, unspecified altered mental status type [R41.82] Ischemic stroke [facility]) [I63.9] Patient Active Problem List   Diagnosis Date Noted  . Cerebral thrombosis with cerebral infarction 08/28/2020  . Ischemic stroke ([place]) 08/28/2020  . Back pain 08/09/2020  . Confusion 08/23/2020  . Acute encephalopathy 08/21/2020  . Pancytopenia ([place]) 08/28/2020  . Diarrhea 08/25/2020  . Skin lesion 06/21/2020  . Pain of right thumb 05/25/2017  . Fall 02/14/2017  . Neuropathy 05/17/2016  . Left hip pain 01/15/2016  . Health care maintenance 01/19/2015  . Dizziness 01/16/2015  . BMI 33.0-33.9,adult 09/14/2014  . Hypercholesterolemia 08/13/2012  . Thyroid nodule 08/13/2012  . Osteopenia 08/13/2012  . Myelodysplastic syndrome ([place]) 06/04/2012  . Hypertension 06/04/2012  . Diabetes mellitus ([place]) 06/04/2012  . Environmental allergies 06/04/2012   PCP:  Einar Pheasant, MD Pharmacy:   [facility] [address] [place]  [address] [place] [address] Phone: [phone] Fax: [place], [place] - [place] [facility] [address] [place] [address] [facility] [place] [address] Phone: [phone] Fax: [phone]     Social Determinants of Health (SDOH) Interventions    Readmission Risk  Interventions No flowsheet data found.

## 2020-08-29 NOTE — Progress Notes (Addendum)
PROGRESS NOTE    Kara Griffin  SHF:[id number] DOB: 16-Dec-1935 DOA: 08/13/2020 PCP: Einar Pheasant, MD   Brief Narrative:  Patient is 84 year old female with past medical history of myelodysplastic syndrome-followed by Dr. Florene Glen at [facility] on Revlimid, pancytopenia, diabetes mellitus, hypertension, hyperlipidemia presents to emergency department with increasingly confusion since 2 days.Patient son states that since Thanksgiving almost a month and a half ago patient has become gradually weak and not herself.  Recently her blood pressure was found to be low and her primary care physician decreased her antihypertensives during the recent visit 2 days ago.  Patient also has chronic dizziness.  On August 19, 2020 about 8 days ago patient was taken to [facility] after patient had a fall at home.  Over the CT head C-spine and abdomen was done which showed features concerning for right-sided mucous plugging and MAI features and was given Z-Pak.  Following which patient had some couple of episodes of diarrhea.  Has not recorded any fever at home.  Has been having poor appetite with some nausea.  CT scan also at abdomen showed some gallbladder distention with no definite signs of any inflammation.  ED Course: In the ER patient is generally weak oriented to name and place.  Moving all extremities.  At the time of my exam was afebrile not hypoxic Covid test was negative CBC shows pancytopenia at baseline high-sensitivity and was 8 and blood glucose of 161 sodium 132.  LFTs were normal.  MRI brain has been ordered and admitted for acute encephalopathy with generalized weakness and poor appetite.  Assessment & Plan:   Acute right basal ganglia lacunar infarct: -Reviewed MRI brain. -Allow permissive hypertension. -Echo shows ejection fraction more than 50%, grade 1 diastolic dysfunction, -Carotid Doppler: 1 to 39% stenosis in the right and left carotid arteries. -A1c 6.0%, lipid panel:  WNL -Neurology recommended aspirin 81 mg and Plavix 75 mg daily for 3 weeks followed by aspirin alone.  Continue statin.  Appreciate neurology recommendations. -Consult PT/OT/SLP-recommend SNF  Acute metabolic encephalopathy: -Likely in the setting of UTI.  UA looks infected.  Patient had fever of 100.5 in ED.  Patient started on broad-spectrum antibiotics.  Urine culture shows multiple species suggest recollection. -Patient remained afebrile.  Blood culture so far negative.  Change cefepime to Rocephin -B12, folate, TSH, ammonia level, RPR: All came back within normal limits. -EEG negative for seizures -CT renal stone study, right upper quadrant ultrasound, chest x-ray: Negative for acute findings.  COVID-19 negative.    Hypertension: -Allow permissive hypertension and hold p.o. meds for now.  Monitor blood pressure closely.  Hyperlipidemia: Continue statin  Type 2 diabetes mellitus: Well controlled.  A1c 6.0%.  Continue sliding scale insulin.  Myelodysplastic syndrome and pancytopenia: -Followed by oncologist Dr. Florene Glen at [facility]. -WBC/H&H/platelet: At baseline.  Continue to monitor.   -Continue Revlimid  Hypokalemia: Potassium 3.1.  Replenished.  Check magnesium level.  Repeat BMP tomorrow a.m.   DVT prophylaxis: SCD Code Status: Full code Family Communication: Patient's son present at bedside.  Plan of care discussed with patient in length and she verbalized understanding and agreed with it. Disposition Plan: To be determined  Consultants:   Neurology  Procedures:   MRI/MRA brain  EEG  Carotid Doppler  CT renal study  Right upper quadrant ultrasound  Antimicrobials:   Cefepime  Vancomycin  Status is: Inpatient  Remains inpatient appropriate because:Ongoing diagnostic testing needed not appropriate for outpatient work up   Dispo: The patient is from: Home  Anticipated d/c is to: SNF              Anticipated d/c date is: 2 days               Patient currently is not medically stable to d/c.         Subjective: Patient seen and examined.  Son at bedside.  She is more communicative this morning.  She denies any symptoms including headache, chest pain, shortness of breath, urinary symptoms.  Objective: Vitals:   08/28/20 1503 08/28/20 2034 08/28/20 2344 08/29/20 0354  BP: (!) 129/54 (!) 139/50 136/62 134/64  Pulse: 79 87 78 76  Resp: 20  16 16   Temp: 98.2 F (36.8 C) 98.6 F (37 C) 98.7 F (37.1 C) 98.5 F (36.9 C)  TempSrc: Oral Oral Oral Oral  SpO2: 98% 98% 99% 97%    Intake/Output Summary (Last 24 hours) at 08/29/2020 1030 Last data filed at 08/29/2020 0300 Gross per 24 hour  Intake 1744.63 ml  Output --  Net 1744.63 ml   There were no vitals filed for this visit.  Examination:  General exam: Appears calm and comfortable, on room air, elderly, communicating well and following commands. Respiratory system: Clear to auscultation. Respiratory effort normal. Cardiovascular system: S1 & S2 heard, RRR. No JVD, murmurs, rubs, gallops or clicks. No pedal edema. Gastrointestinal system: Abdomen is nondistended, soft and nontender. No organomegaly or masses felt. Normal bowel sounds heard. Central nervous system: Alert and oriented.  Following commands.  Power 5 out of 5 in bilateral upper extremities and 3 out of 5 in bilateral lower extremities.  Sensation intact. Skin: No rashes, lesions or ulcers    Data Reviewed: I have personally reviewed following labs and imaging studies  CBC: Recent Labs  Lab 08/19/2020 1814 08/29/20 0324  WBC 3.4* 2.4*  HGB 11.2* 9.3*  HCT 34.5* 26.5*  MCV 89.6 88.0  PLT 141* [id number]*   Basic Metabolic Panel: Recent Labs  Lab 08/25/2020 1814 08/29/20 0324  NA 132* 133*  K 3.5 3.1*  CL 96* 101  CO2 24 21*  GLUCOSE 161* 141*  BUN 13 9  CREATININE 0.95 0.76  CALCIUM 9.7 8.2*   GFR: Estimated Creatinine Clearance: 49.2 mL/min (by C-G formula based on SCr of 0.76  mg/dL). Liver Function Tests: Recent Labs  Lab 08/12/2020 1814  AST 11*  ALT 15  ALKPHOS 58  BILITOT 0.8  PROT 6.8  ALBUMIN 3.8   No results for input(s): LIPASE, AMYLASE in the last 168 hours. Recent Labs  Lab 09/01/2020 2356  AMMONIA 11   Coagulation Profile: No results for input(s): INR, PROTIME in the last 168 hours. Cardiac Enzymes: No results for input(s): CKTOTAL, CKMB, CKMBINDEX, TROPONINI in the last 168 hours. BNP (last 3 results) No results for input(s): PROBNP in the last 8760 hours. HbA1C: Recent Labs    08/28/20 0455  HGBA1C 6.0*   CBG: Recent Labs  Lab 08/28/20 0804 08/28/20 1232 08/28/20 1820 08/28/20 2239 08/29/20 0644  GLUCAP 133* 143* 117* 151* 143*   Lipid Profile: Recent Labs    08/28/20 0455  CHOL 99  HDL 40*  LDLCALC 35  TRIG 118  CHOLHDL 2.5   Thyroid Function Tests: Recent Labs    08/28/2020 2356  TSH 1.571   Anemia Panel: Recent Labs    08/23/2020 2356 08/28/20 0416  VITAMINB12 843  --   FOLATE  --  34.7   Sepsis Labs: No results for input(s): PROCALCITON, LATICACIDVEN in the last  168 hours.  Recent Results (from the past 240 hour(s))  Urine culture     Status: Abnormal   Collection Time: 08/19/20  3:59 PM   Specimen: Urine, Random  Result Value Ref Range Status   Specimen Description   Final    URINE, RANDOM Performed at [facility], [address], [place], [place] [address]    Special Requests   Final    NONE Performed at [facility], [address] [place], [place] [address]    Culture MULTIPLE SPECIES PRESENT, SUGGEST RECOLLECTION (A)  Final   Report Status 08/20/2020 FINAL  Final  Culture, Urine     Status: Abnormal   Collection Time: 08/21/2020  6:56 PM   Specimen: Urine, Random  Result Value Ref Range Status   Specimen Description URINE, RANDOM  Final   Special Requests   Final    NONE Performed at [facility], [place] [address], [place],  [address]    Culture MULTIPLE  SPECIES PRESENT, SUGGEST RECOLLECTION (A)  Final   Report Status 08/29/2020 FINAL  Final  Resp Panel by RT-PCR (Flu A&B, Covid) Nasopharyngeal Swab     Status: None   Collection Time: 08/28/2020 10:15 PM   Specimen: Nasopharyngeal Swab; Nasopharyngeal(NP) swabs in vial transport medium  Result Value Ref Range Status   SARS Coronavirus 2 by RT PCR NEGATIVE NEGATIVE Final    Comment: (NOTE) SARS-CoV-2 target nucleic acids are NOT DETECTED.  The SARS-CoV-2 RNA is generally detectable in upper respiratory specimens during the acute phase of infection. The lowest concentration of SARS-CoV-2 viral copies this assay can detect is 138 copies/mL. A negative result does not preclude SARS-Cov-2 infection and should not be used as the sole basis for treatment or other patient management decisions. A negative result may occur with  improper specimen collection/handling, submission of specimen other than nasopharyngeal swab, presence of viral mutation(s) within the areas targeted by this assay, and inadequate number of viral copies(<138 copies/mL). A negative result must be combined with clinical observations, patient history, and epidemiological information. The expected result is Negative.  Fact Sheet for Patients:  EntrepreneurPulse.com.au  Fact Sheet for Healthcare Providers:  IncredibleEmployment.be  This test is no t yet approved or cleared by the [place] FDA and  has been authorized for detection and/or diagnosis of SARS-CoV-2 by FDA under an Emergency Use Authorization (EUA). This EUA will remain  in effect (meaning this test can be used) for the duration of the COVID-19 declaration under Section 564(b)(1) of the Act, 21 U.S.C.section 360bbb-3(b)(1), unless the authorization is terminated  or revoked sooner.       Influenza A by PCR NEGATIVE NEGATIVE Final   Influenza B by PCR NEGATIVE NEGATIVE Final    Comment: (NOTE) The Xpert Xpress  SARS-CoV-2/FLU/RSV plus assay is intended as an aid in the diagnosis of influenza from Nasopharyngeal swab specimens and should not be used as a sole basis for treatment. Nasal washings and aspirates are unacceptable for Xpert Xpress SARS-CoV-2/FLU/RSV testing.  Fact Sheet for Patients: EntrepreneurPulse.com.au  Fact Sheet for Healthcare Providers: IncredibleEmployment.be  This test is not yet approved or cleared by the [place] FDA and has been authorized for detection and/or diagnosis of SARS-CoV-2 by FDA under an Emergency Use Authorization (EUA). This EUA will remain in effect (meaning this test can be used) for the duration of the COVID-19 declaration under Section 564(b)(1) of the Act, 21 U.S.C. section 360bbb-3(b)(1), unless the authorization is terminated or revoked.  Performed at  [facility], [place] [address] [place], [place] [address]   Culture, blood (routine x 2)     Status: None (Preliminary result)   Collection Time: 08/28/20  4:26 AM   Specimen: BLOOD RIGHT HAND  Result Value Ref Range Status   Specimen Description BLOOD RIGHT HAND  Final   Special Requests   Final    BOTTLES DRAWN AEROBIC AND ANAEROBIC Blood Culture adequate volume   Culture   Final    NO GROWTH < 12 HOURS Performed at [facility], [place] [address] [place], [place] [address]    Report Status PENDING  Incomplete  Culture, blood (routine x 2)     Status: None (Preliminary result)   Collection Time: 08/28/20  4:35 AM   Specimen: BLOOD LEFT WRIST  Result Value Ref Range Status   Specimen Description BLOOD LEFT WRIST  Final   Special Requests   Final    BOTTLES DRAWN AEROBIC AND ANAEROBIC Blood Culture adequate volume   Culture   Final    NO GROWTH < 12 HOURS Performed at [facility], [place] [address] [place], [place] [address]    Report Status PENDING  Incomplete      Radiology Studies: MR ANGIO HEAD WO CONTRAST  Result Date:  08/28/2020 CLINICAL DATA:  Stroke follow-up. Acute right basal ganglia infarct on earlier MRI. EXAM: MRA HEAD WITHOUT CONTRAST TECHNIQUE: Angiographic images of the Circle of Willis were obtained using MRA technique without intravenous contrast. COMPARISON:  None. FINDINGS: The visualized distal vertebral arteries are widely patent to the basilar with the left being strongly dominant. Patent AICA and SCA origins are identified bilaterally. The basilar artery is widely patent. Posterior communicating arteries are not identified and may be diminutive or absent. The PCAs are patent without evidence of a significant proximal stenosis. There are distal cervical internal carotid artery loops bilaterally. The intracranial internal carotid arteries are patent without evidence of significant stenosis allowing for motion artifact through the anterior genu of the left ICA. ACAs and MCAs are patent without evidence of a proximal branch occlusion or significant M1 stenosis. Assessment for branch vessel stenosis is limited by motion. There is a moderate stenosis versus motion artifact in the distal left A1 segment. No aneurysm is identified. IMPRESSION: 1. No large vessel occlusion. 2. Moderate left A1 stenosis versus motion artifact. Electronically Signed   By: Logan Bores M.D.   On: 08/28/2020 03:11   MR BRAIN WO CONTRAST  Result Date: 08/28/2020 CLINICAL DATA:  Dizziness.  Memory loss. EXAM: MRI HEAD WITHOUT CONTRAST TECHNIQUE: Multiplanar, multiecho pulse sequences of the brain and surrounding structures were obtained without intravenous contrast. COMPARISON:  Head CT 08/19/2020 FINDINGS: Brain: There is a 4 mm acute infarct at the anteroinferior aspect of the right basal ganglia. Small T2 hyperintensities scattered throughout the cerebral white matter bilaterally are nonspecific but compatible with mild chronic small vessel ischemic disease. Two small adjacent chronic hemorrhages are noted in the subcortical white  matter of the left frontal lobe. Mild cerebral atrophy is within normal limits for age. Vascular: Major intracranial vascular flow voids are preserved. Skull and upper cervical spine: Unremarkable bone marrow signal. Sinuses/Orbits: Bilateral cataract extraction. Paranasal sinuses and mastoid air cells are clear. Other: None. IMPRESSION: 1. Acute right basal ganglia lacunar infarct. 2. Mild chronic small vessel ischemic disease. Electronically Signed   By: Logan Bores M.D.   On: 08/28/2020 01:18   DG CHEST PORT 1 VIEW  Result Date: 08/28/2020 CLINICAL  DATA:  Fever EXAM: PORTABLE CHEST 1 VIEW COMPARISON:  08/19/2020 FINDINGS: Borderline heart size. Blunting at the lateral left costophrenic sulcus that is stable and most likely from mediastinal fat. Reticulation of lung markings. There is signs of chronic indolent infection/scarring in the lower lungs on recent abdominal CT. There is no edema, consolidation, effusion, or pneumothorax. IMPRESSION: Stable from prior.  No acute finding. Electronically Signed   By: Monte Fantasia M.D.   On: 08/28/2020 07:05   EEG adult  Result Date: 08/28/2020 Lora Havens, MD     08/28/2020 12:15 PM Patient Name: Teala Kolbo MRN: [id number] Epilepsy Attending: Lora Havens Referring Physician/Provider: Dr Antony Contras Date: 08/28/2020 Duration: 24.43 mins Patient history: 84yo F with R basal ganglia infarct and cognitive impairment. EEG to evaluate for seizure. Level of alertness: Awake AEDs during EEG study: None Technical aspects: This EEG study was done with scalp electrodes positioned according to the 10-20 International system of electrode placement. Electrical activity was acquired at a sampling rate of 500Hz  and reviewed with a high frequency filter of 70Hz  and a low frequency filter of 1Hz . EEG data were recorded continuously and digitally stored. Description: The posterior dominant rhythm consists of 8 Hz activity of moderate voltage (25-35 uV)  seen predominantly in posterior head regions, symmetric and reactive to eye opening and eye closing. EEG showed intermittent generalized 3 to 6 Hz theta-delta slowing.  Hyperventilation and photic stimulation were not performed.   ABNORMALITY -Intermittent slow, generalized IMPRESSION: This study is suggestive of mild diffuse encephalopathy, nonspecific etiology. No seizures or epileptiform discharges were seen throughout the recording. Lora Havens   ECHOCARDIOGRAM COMPLETE  Result Date: 08/28/2020    ECHOCARDIOGRAM REPORT   Patient Name:   Benson Setting Date of Exam: 08/28/2020 Medical Rec #:  [id number]              Height:       61.0 in Accession #:    [id number]             Weight:       159.0 lb Date of Birth:  Sep 05, 1936              BSA:          1.713 m Patient Age:    65 years               BP:           118/49 mmHg Patient Gender: F                      HR:           88 bpm. Exam Location:  Inpatient Procedure: 2D Echo, Cardiac Doppler and Color Doppler Indications:    Stroke 434.91 / I163.9  History:        Patient has no prior history of Echocardiogram examinations.                 Risk Factors:Hypertension and Diabetes.  Sonographer:    Bernadene Person RDCS Referring Phys: [place]  1. Left ventricular ejection fraction, by estimation, is >75%. The left ventricle has normal function. The left ventricle has no regional wall motion abnormalities. Left ventricular diastolic parameters are consistent with Grade I diastolic dysfunction (impaired relaxation).  2. Right ventricular systolic function is normal. The right ventricular size is normal. There is mildly elevated pulmonary artery systolic pressure.  3. The mitral valve is normal  in structure. No evidence of mitral valve regurgitation. No evidence of mitral stenosis.  4. The aortic valve is tricuspid. There is mild calcification of the aortic valve. There is mild thickening of the aortic valve. Aortic valve  regurgitation is not visualized. No aortic stenosis is present.  5. The inferior vena cava is normal in size with greater than 50% respiratory variability, suggesting right atrial pressure of 3 mmHg. FINDINGS  Left Ventricle: Left ventricular ejection fraction, by estimation, is >75%. The left ventricle has normal function. The left ventricle has no regional wall motion abnormalities. The left ventricular internal cavity size was normal in size. There is no left ventricular hypertrophy. Left ventricular diastolic parameters are consistent with Grade I diastolic dysfunction (impaired relaxation). Normal left ventricular filling pressure. Right Ventricle: The right ventricular size is normal. No increase in right ventricular wall thickness. Right ventricular systolic function is normal. There is mildly elevated pulmonary artery systolic pressure. The tricuspid regurgitant velocity is 2.96  m/s, and with an assumed right atrial pressure of 3 mmHg, the estimated right ventricular systolic pressure is 38.0 mmHg. Left Atrium: Left atrial size was normal in size. Right Atrium: Right atrial size was normal in size. Pericardium: There is no evidence of pericardial effusion. Mitral Valve: The mitral valve is normal in structure. No evidence of mitral valve regurgitation. No evidence of mitral valve stenosis. Tricuspid Valve: The tricuspid valve is normal in structure. Tricuspid valve regurgitation is mild . No evidence of tricuspid stenosis. Aortic Valve: The aortic valve is tricuspid. There is mild calcification of the aortic valve. There is mild thickening of the aortic valve. There is mild aortic valve annular calcification. Aortic valve regurgitation is not visualized. No aortic stenosis  is present. Aortic valve mean gradient measures 5.4 mmHg. Aortic valve peak gradient measures 11.3 mmHg. Aortic valve area, by VTI measures 2.52 cm. Pulmonic Valve: The pulmonic valve was not well visualized. Pulmonic valve  regurgitation is not visualized. No evidence of pulmonic stenosis. Aorta: The aortic root is normal in size and structure. Pulmonary Artery: 35. Venous: The inferior vena cava is normal in size with greater than 50% respiratory variability, suggesting right atrial pressure of 3 mmHg. IAS/Shunts: No atrial level shunt detected by color flow Doppler.  LEFT VENTRICLE PLAX 2D LVIDd:         4.00 cm  Diastology LVIDs:         1.90 cm  LV e' medial:    7.34 cm/s LV PW:         1.00 cm  LV E/e' medial:  11.9 LV IVS:        1.00 cm  LV e' lateral:   6.09 cm/s LVOT diam:     2.10 cm  LV E/e' lateral: 14.4 LV SV:         72 LV SV Index:   42 LVOT Area:     3.46 cm  RIGHT VENTRICLE RV S prime:     18.10 cm/s TAPSE (M-mode): 1.9 cm LEFT ATRIUM             Index       RIGHT ATRIUM           Index LA diam:        2.60 cm 1.52 cm/m  RA Area:     12.40 cm LA Vol (A2C):   32.6 ml 19.03 ml/m RA Volume:   28.30 ml  16.52 ml/m LA Vol (A4C):   42.1 ml 24.57 ml/m LA Biplane Vol:  36.8 ml 21.48 ml/m  AORTIC VALVE AV Area (Vmax):    2.61 cm AV Area (Vmean):   2.72 cm AV Area (VTI):     2.52 cm AV Vmax:           168.33 cm/s AV Vmean:          109.711 cm/s AV VTI:            0.286 m AV Peak Grad:      11.3 mmHg AV Mean Grad:      5.4 mmHg LVOT Vmax:         127.00 cm/s LVOT Vmean:        86.300 cm/s LVOT VTI:          0.208 m LVOT/AV VTI ratio: 0.73  AORTA Ao Root diam: 3.00 cm Ao Asc diam:  3.00 cm MITRAL VALVE                TRICUSPID VALVE MV Area (PHT): 3.77 cm     TR Peak grad:   35.0 mmHg MV Decel Time: 201 msec     TR Vmax:        296.00 cm/s MV E velocity: 87.50 cm/s MV A velocity: 158.00 cm/s  SHUNTS MV E/A ratio:  0.55         Systemic VTI:  0.21 m                             Systemic Diam: 2.10 cm Carlyle Dolly MD Electronically signed by Carlyle Dolly MD Signature Date/Time: 08/28/2020/8:53:08 AM    Final    CT RENAL STONE STUDY  Result Date: 08/28/2020 CLINICAL DATA:  Flank pain and fevers EXAM: CT ABDOMEN  AND PELVIS WITHOUT CONTRAST TECHNIQUE: Multidetector CT imaging of the abdomen and pelvis was performed following the standard protocol without IV contrast. COMPARISON:  08/19/2020 FINDINGS: Lower chest: Lung bases again demonstrates some bronchiectatic changes in the right middle lobe and lingula similar to that seen on prior study. No new focal infiltrate is seen. Hepatobiliary: No focal liver abnormality is seen. No gallstones, gallbladder wall thickening, or biliary dilatation. Pancreas: Unremarkable. No pancreatic ductal dilatation or surrounding inflammatory changes. Spleen: Normal in size without focal abnormality. Adrenals/Urinary Tract: Adrenal glands are within normal limits. Kidneys are well visualized bilaterally. No renal calculi or obstructive changes are seen. Small cyst is noted in the upper pole of the right kidney. The bladder is well distended. Stomach/Bowel: The appendix is not well visualized and may have been surgically removed. No obstructive or inflammatory changes of the large or small bowel are seen. Stomach is decompressed. Vascular/Lymphatic: Aortic atherosclerosis. No enlarged abdominal or pelvic lymph nodes. Reproductive: Status post hysterectomy. No adnexal masses. Other: No abdominal wall hernia or abnormality. No abdominopelvic ascites. Musculoskeletal: No acute or significant osseous findings. IMPRESSION: Changes similar to that seen on prior exam. No acute abnormality is noted. Electronically Signed   By: Inez Catalina M.D.   On: 08/28/2020 09:15   VAS US CAROTID (at [facility] and [facility] only)  Result Date: 08/29/2020 Carotid Arterial Duplex Study Indications:  Recent fall with progressive decline of mental status. Risk Factors: Hypertension, hyperlipidemia, Diabetes. Performing Technologist: Rogelia Rohrer  Examination Guidelines: A complete evaluation includes B-mode imaging, spectral Doppler, color Doppler, and power Doppler as needed of all accessible portions of each vessel. Bilateral  testing is considered an integral part of a complete examination. Limited examinations for reoccurring indications may be performed  as noted.  Right Carotid Findings: +----------+--------+--------+--------+------------------+--------+           PSV cm/sEDV cm/sStenosisPlaque DescriptionComments +----------+--------+--------+--------+------------------+--------+ CCA Prox  97      5                                          +----------+--------+--------+--------+------------------+--------+ CCA Distal122     28                                         +----------+--------+--------+--------+------------------+--------+ ICA Prox  87      23      1-39%                              +----------+--------+--------+--------+------------------+--------+ ECA       112     0                                          +----------+--------+--------+--------+------------------+--------+ +----------+--------+-------+----------------+-------------------+           PSV cm/sEDV cmsDescribe        Arm Pressure (mmHG) +----------+--------+-------+----------------+-------------------+ [id number]     0      Multiphasic, WNL                    +----------+--------+-------+----------------+-------------------+ +---------+--------+--+--------+--+---------+ VertebralPSV cm/s58EDV cm/s10Antegrade +---------+--------+--+--------+--+---------+  Left Carotid Findings: +----------+--------+--------+--------+------------------+--------+           PSV cm/sEDV cm/sStenosisPlaque DescriptionComments +----------+--------+--------+--------+------------------+--------+ CCA Prox  104     0                                          +----------+--------+--------+--------+------------------+--------+ CCA Distal100     12                                         +----------+--------+--------+--------+------------------+--------+ ICA Prox  53      12      1-39%                               +----------+--------+--------+--------+------------------+--------+ ICA Distal77      17                                         +----------+--------+--------+--------+------------------+--------+ ECA       63      0                                          +----------+--------+--------+--------+------------------+--------+ +----------+--------+--------+----------------+-------------------+           PSV cm/sEDV cm/sDescribe        Arm Pressure (mmHG) +----------+--------+--------+----------------+-------------------+ [id number]      0       Multiphasic, WNL                    +----------+--------+--------+----------------+-------------------+ +---------+--------+--+--------+--+---------+  VertebralPSV cm/s65EDV cm/s17Antegrade +---------+--------+--+--------+--+---------+   Summary: Right Carotid: Velocities in the right ICA are consistent with a 1-39% stenosis. Left Carotid: Velocities in the left ICA are consistent with a 1-39% stenosis. Vertebrals:  Bilateral vertebral arteries demonstrate antegrade flow. Subclavians: Normal flow hemodynamics were seen in bilateral subclavian              arteries. *See table(s) above for measurements and observations.  Electronically signed by Servando Snare MD on 08/29/2020 at 7:59:00 AM.    Final    US Abdomen Limited RUQ (LIVER/GB)  Result Date: 08/28/2020 CLINICAL DATA:  Nausea and vomiting. EXAM: ULTRASOUND ABDOMEN LIMITED RIGHT UPPER QUADRANT COMPARISON:  CT scan 08/19/2020 FINDINGS: Gallbladder: No gallstones or gallbladder wall thickening. No pericholecystic fluid. The sonographer reports no sonographic Murphy's sign. Common bile duct: Diameter: 1-2 mm Liver: No focal lesion identified. Within normal limits in parenchymal echogenicity. Portal vein is patent on color Doppler imaging with normal direction of blood flow towards the liver. Other: None. IMPRESSION: No acute findings. No evidence to explain the patient's history of  nausea and vomiting. Electronically Signed   By: Misty Stanley M.D.   On: 08/28/2020 08:04    Scheduled Meds: .  stroke: mapping our early stages of recovery book   Does not apply Once  . aspirin  81 mg Oral Daily  . aspirin  300 mg Rectal Daily  . clopidogrel  75 mg Oral Daily  . insulin aspart  0-9 Units Subcutaneous TID WC  . lenalidomide  10 mg Oral Daily  . potassium chloride  40 mEq Oral BID  . rosuvastatin  10 mg Oral q1800  . vitamin B-12  1,000 mcg Oral Daily   Continuous Infusions: . ceFEPime (MAXIPIME) IV 2 g (08/29/20 0842)     LOS: 1 day   Time spent: 35 minutes  Kori Colin Loann Quill, MD [facility]  If 7PM-7AM, please contact night-coverage [email or web address] 08/29/2020, 10:30 AM

## 2020-08-29 NOTE — NC FL2 (Signed)
[place] MEDICAID FL2 LEVEL OF CARE SCREENING TOOL     IDENTIFICATION  Patient Name: Kara Griffin Birthdate: [birth year] Sex: female Admission Date (Current Location): 2020-08-29  [facility] [place] Number:  Herbalist and Address:  [employer] [facility], [place] [address] [place], [place] [address]      Provider Number: [id number]  Attending Physician Name and Address:  Mckinley Jewel, MD  Relative Name and Phone Number:  Merline Perkin  [phone]    Current Level of Care: Hospital Recommended Level of Care: [place] Prior Approval Number:    Date Approved/Denied:   PASRR Number: [id number]  Discharge Plan: SNF    Current Diagnoses: Patient Active Problem List   Diagnosis Date Noted  . Cerebral thrombosis with cerebral infarction 08/28/2020  . Ischemic stroke ([place]) 08/28/2020  . Back pain August 29, 2020  . Confusion 08/29/2020  . Acute encephalopathy 08-29-2020  . Pancytopenia ([place]) 08-29-2020  . Diarrhea 08/25/2020  . Skin lesion 06/21/2020  . Pain of right thumb 05/25/2017  . Fall 02/14/2017  . Neuropathy 05/17/2016  . Left hip pain 01/15/2016  . Health care maintenance 01/19/2015  . Dizziness 01/16/2015  . BMI 33.0-33.9,adult 09/14/2014  . Hypercholesterolemia 08/13/2012  . Thyroid nodule 08/13/2012  . Osteopenia 08/13/2012  . Myelodysplastic syndrome ([place]) 06/04/2012  . Hypertension 06/04/2012  . Diabetes mellitus ([place]) 06/04/2012  . Environmental allergies 06/04/2012    Orientation RESPIRATION BLADDER Height & Weight     Self,Time,Situation,Place  Normal External catheter Weight:   Height:     BEHAVIORAL SYMPTOMS/MOOD NEUROLOGICAL BOWEL NUTRITION STATUS      Continent Diet (See DC summary)  AMBULATORY STATUS COMMUNICATION OF NEEDS Skin   Extensive Assist Verbally Normal                       Personal Care Assistance Level of Assistance  Bathing,Feeding,Dressing Bathing Assistance:  Maximum assistance Feeding assistance: Limited assistance Dressing Assistance: Maximum assistance     Functional Limitations Info  Sight,Hearing,Speech Sight Info: Adequate Hearing Info: Adequate Speech Info: Adequate    SPECIAL CARE FACTORS FREQUENCY  PT (By licensed PT),OT (By licensed OT)     PT Frequency: 5x week OT Frequency: 5x week            Contractures Contractures Info: Not present    Additional Factors Info  Code Status,Allergies,Insulin Sliding Scale Code Status Info: Full Allergies Info: Ibuprofen   Insulin Sliding Scale Info: Insulin Aspart (Novolog) 0-9 U 3x daily w/ meals       Current Medications (08/29/2020):  This is the current hospital active medication list Current Facility-Administered Medications  Medication Dose Route Frequency Provider Last Rate Last Admin  .  stroke: mapping our early stages of recovery book   Does not apply Once Rise Patience, MD      . acetaminophen (TYLENOL) tablet 650 mg  650 mg Oral Q6H PRN Rise Patience, MD      . aspirin chewable tablet 81 mg  81 mg Oral Daily Rise Patience, MD   81 mg at 08/29/20 1019  . cefTRIAXone (ROCEPHIN) 1 g in sodium chloride 0.9 % 100 mL IVPB  1 g Intravenous QHS Pahwani, Rinka R, MD      . clopidogrel (PLAVIX) tablet 75 mg  75 mg Oral Daily Pahwani, Rinka R, MD   75 mg at 08/29/20 1019  . insulin aspart (novoLOG) injection 0-9 Units  0-9 Units Subcutaneous TID  WC Rise Patience, MD   2 Units at 08/29/20 1206  . lenalidomide (REVLIMID) capsule 10 mg  10 mg Oral Daily Rise Patience, MD      . potassium chloride (KLOR-CON) packet 40 mEq  40 mEq Oral BID Pahwani, Rinka R, MD   40 mEq at 08/29/20 1019  . rosuvastatin (CRESTOR) tablet 10 mg  10 mg Oral q1800 Rise Patience, MD   10 mg at 08/28/20 1821  . vitamin B-12 (CYANOCOBALAMIN) tablet 1,000 mcg  1,000 mcg Oral Daily Rise Patience, MD   1,000 mcg at 08/29/20 1019     Discharge  Medications: Please see discharge summary for a list of discharge medications.  Relevant Imaging Results:  Relevant Lab Results:   Additional Information SS# [place], [id number]

## 2020-08-30 DIAGNOSIS — E1165 Type 2 diabetes mellitus with hyperglycemia: Secondary | ICD-10-CM | POA: Diagnosis not present

## 2020-08-30 LAB — BASIC METABOLIC PANEL
Anion gap: 9 (ref 5–15)
BUN: 7 mg/dL — ABNORMAL LOW (ref 8–23)
CO2: 22 mmol/L (ref 22–32)
Calcium: 8.5 mg/dL — ABNORMAL LOW (ref 8.9–10.3)
Chloride: 101 mmol/L (ref 98–111)
Creatinine, Ser: 0.64 mg/dL (ref 0.44–1.00)
GFR, Estimated: >60 mL/min (ref >60)
Glucose, Bld: 143 mg/dL — ABNORMAL HIGH (ref 70–99)
Potassium: 3.8 mmol/L (ref 3.5–5.1)
Sodium: 132 mmol/L — ABNORMAL LOW (ref 135–145)

## 2020-08-30 LAB — GLUCOSE, CAPILLARY
Glucose-Capillary: 150 mg/dL — ABNORMAL HIGH (ref 70–99)
Glucose-Capillary: 139 mg/dL — ABNORMAL HIGH (ref 70–99)
Glucose-Capillary: 172 mg/dL — ABNORMAL HIGH (ref 70–99)
Glucose-Capillary: 139 mg/dL — ABNORMAL HIGH (ref 70–99)

## 2020-08-30 LAB — CBC
HCT: 29.5 % — ABNORMAL LOW (ref 36.0–46.0)
Hemoglobin: 9.8 g/dL — ABNORMAL LOW (ref 12.0–15.0)
MCH: 29.1 pg (ref 26.0–34.0)
MCHC: 33.2 g/dL (ref 30.0–36.0)
MCV: 87.5 fL (ref 80.0–100.0)
Platelets: 125 10*3/uL — ABNORMAL LOW (ref 150–400)
RBC: 3.37 MIL/uL — ABNORMAL LOW (ref 3.87–5.11)
RDW: 15.6 % — ABNORMAL HIGH (ref 11.5–15.5)
WBC: 2.5 10*3/uL — ABNORMAL LOW (ref 4.0–10.5)
nRBC: 0 % (ref 0.0–0.2)

## 2020-08-30 LAB — URINE CULTURE: Culture: NO GROWTH

## 2020-08-30 MED ORDER — BISACODYL 10 MG RE SUPP
10.0000 mg | Freq: Once | RECTAL | Status: AC
  Administered 2020-08-30: 11:00:00 10 mg via RECTAL
  Filled 2020-08-30: qty 1

## 2020-08-30 MED ORDER — POLYETHYLENE GLYCOL 3350 17 G PO PACK
17.0000 g | PACK | Freq: Every day | ORAL | Status: DC
  Administered 2020-08-30 – 2020-09-10 (×8): 17 g via ORAL
  Filled 2020-08-30 (×9): qty 1

## 2020-08-30 MED ORDER — LOSARTAN POTASSIUM 25 MG PO TABS
25.0000 mg | ORAL_TABLET | Freq: Every day | ORAL | Status: DC
  Administered 2020-08-30 – 2020-09-02 (×4): 25 mg via ORAL
  Filled 2020-08-30 (×4): qty 1

## 2020-08-30 MED ORDER — SODIUM CHLORIDE 0.9 % IV SOLN
1.0000 g | Freq: Every day | INTRAVENOUS | Status: AC
  Administered 2020-08-30: 22:00:00 1 g via INTRAVENOUS
  Filled 2020-08-30: qty 10

## 2020-08-30 MED ORDER — SENNOSIDES-DOCUSATE SODIUM 8.6-50 MG PO TABS
1.0000 | ORAL_TABLET | Freq: Two times a day (BID) | ORAL | Status: DC
  Administered 2020-08-30 – 2020-09-10 (×13): 1 via ORAL
  Filled 2020-08-30 (×16): qty 1

## 2020-08-30 NOTE — Progress Notes (Signed)
PROGRESS NOTE    Kara Griffin  [name] DOB: July 26, 1936 DOA: 08/13/2020 PCP: Einar Pheasant, MD   Brief Narrative:  Patient is 84 year old female with past medical history of myelodysplastic syndrome-followed by Dr. Florene Glen at [facility] on Revlimid, pancytopenia, diabetes mellitus, hypertension, hyperlipidemia presents to emergency department with increasingly confusion since 2 days.Patient son states that since Thanksgiving almost a month and a half ago patient has become gradually weak and not herself.  Recently her blood pressure was found to be low and her primary care physician decreased her antihypertensives during the recent visit 2 days ago.  Patient also has chronic dizziness.  On August 19, 2020 about 8 days ago patient was taken to [facility] after patient had a fall at home.  Over the CT head C-spine and abdomen was done which showed features concerning for right-sided mucous plugging and MAI features and was given Z-Pak.  Following which patient had some couple of episodes of diarrhea.  Has not recorded any fever at home.  Has been having poor appetite with some nausea.  CT scan also at abdomen showed some gallbladder distention with no definite signs of any inflammation.  ED Course: In the ER patient is generally weak oriented to name and place.  Moving all extremities.  At the time of my exam was afebrile not hypoxic Covid test was negative CBC shows pancytopenia at baseline high-sensitivity and was 8 and blood glucose of 161 sodium 132.  LFTs were normal.  MRI brain has been ordered and admitted for acute encephalopathy with generalized weakness and poor appetite.  Assessment & Plan:   Acute right basal ganglia lacunar infarct: -Reviewed MRI brain. -Allow permissive hypertension. -Echo shows ejection fraction more than [id number], grade 1 diastolic dysfunction, -Carotid Doppler: 1 to 39% stenosis in the right and left carotid arteries. -A1c 6.0%, lipid panel:  WNL -Neurology recommended aspirin 81 mg and Plavix 75 mg daily for 3 weeks followed by aspirin alone.  Continue statin.  Appreciate neurology recommendations. -Consult PT/OT/SLP-recommend SNF-await bed availability/insurance authorization.  Acute metabolic encephalopathy: Improving -Likely in the setting of UTI.  UA looks infected.  Patient had fever of 100.5 in ED.  Patient started on broad-spectrum antibiotics. -Patient remained afebrile.  Blood culture so far negative.   -B12, folate, TSH, ammonia level, RPR: All came back within normal limits. -EEG negative for seizures -CT renal stone study, right upper quadrant ultrasound, chest x-ray: Negative for acute findings.  COVID-19 negative.   -Initial urine culture shows multiple species.  Repeat urine culture came back negative. -Received IV cefepime for 1 day and then  IV Rocephin- day 2.  Will DC Rocephin after today's dose.  Hypertension: -Resume losartan today.  Monitor blood pressure closely  Hyperlipidemia: Continue statin  Type 2 diabetes mellitus: Well controlled.  A1c 6.0%.  Continue sliding scale insulin.  Myelodysplastic syndrome and pancytopenia: -Followed by oncologist Dr. Florene Glen at [facility]. -WBC/H&H/platelet: At baseline.  Continue to monitor.   -Continue Revlimid  Hypokalemia: Resolved  Hyponatremia: Sodium 132.  Patient is asymptomatic.  Continue to monitor.  Constipation: Ordered MiraLAX and Dulcolax suppository  Patient is doing well.  Await SNF bed placement and insurance authorization.  DVT prophylaxis: SCD Code Status: Full code Family Communication: Patient's son present at bedside.  Plan of care discussed with patient in length and she verbalized understanding and agreed with it. Disposition Plan: SNF tomorrow  Consultants:   Neurology  Procedures:   MRI/MRA brain  EEG  Carotid Doppler  CT renal study  Right upper quadrant ultrasound  Antimicrobials:    Cefepime  Vancomycin  Rocephin  Status is: Inpatient   Dispo: The patient is from: Home              Anticipated d/c is to: SNF              Anticipated d/c date is: 1 day              Patient currently is not medically stable to d/c.    Subjective: Patient seen and examined.  Son at bedside.  Son reports that patient had not had bowel movement since admission.  No headache, blurry vision, chest pain, shortness of breath, abdominal pain.  Objective: Vitals:   08/30/20 0007 08/30/20 0438 08/30/20 0820 08/30/20 1200  BP: (!) 159/64 (!) 151/61 (!) 151/55 (!) 146/52  Pulse: 75 76 76 82  Resp: 16 17 16 16   Temp: 97.8 F (36.6 C) 97.8 F (36.6 C) 98.1 F (36.7 C) 98 F (36.7 C)  TempSrc: Oral Oral Oral Oral  SpO2: 96% 97% 96% 97%    Intake/Output Summary (Last 24 hours) at 08/30/2020 1323 Last data filed at 08/30/2020 0316 Gross per 24 hour  Intake 100 ml  Output 550 ml  Net -450 ml   There were no vitals filed for this visit.  Examination:  General exam: Appears calm and comfortable, on room air, elderly, communicating well and following commands. Respiratory system: Clear to auscultation. Respiratory effort normal. Cardiovascular system: S1 & S2 heard, RRR. No JVD, murmurs, rubs, gallops or clicks. No pedal edema. Gastrointestinal system: Abdomen is nondistended, soft and nontender. No organomegaly or masses felt. Normal bowel sounds heard. Central nervous system: Alert and oriented x3.  Following commands.  Power 5 out of 5 in bilateral upper extremities and 3 out of 5 in bilateral lower extremities.  Sensation intact. Skin: No rashes, lesions or ulcers    Data Reviewed: I have personally reviewed following labs and imaging studies  CBC: Recent Labs  Lab 08/26/2020 1814 08/29/20 0324 08/30/20 0225  WBC 3.4* 2.4* 2.5*  HGB 11.2* 9.3* 9.8*  HCT 34.5* 26.5* 29.5*  MCV 89.6 88.0 87.5  PLT 141* 115* 0000000*   Basic Metabolic Panel: Recent Labs  Lab  08/05/2020 1814 08/29/20 0324 08/30/20 0225  NA 132* 133* 132*  K 3.5 3.1* 3.8  CL 96* 101 101  CO2 24 21* 22  GLUCOSE 161* 141* 143*  BUN 13 9 7*  CREATININE 0.95 0.76 0.64  CALCIUM 9.7 8.2* 8.5*  MG  --  1.6*  --    GFR: Estimated Creatinine Clearance: 49.2 mL/min (by C-G formula based on SCr of 0.64 mg/dL). Liver Function Tests: Recent Labs  Lab 09/04/2020 1814  AST 11*  ALT 15  ALKPHOS 58  BILITOT 0.8  PROT 6.8  ALBUMIN 3.8   No results for input(s): LIPASE, AMYLASE in the last 168 hours. Recent Labs  Lab 08/11/2020 2356  AMMONIA 11   Coagulation Profile: No results for input(s): INR, PROTIME in the last 168 hours. Cardiac Enzymes: No results for input(s): CKTOTAL, CKMB, CKMBINDEX, TROPONINI in the last 168 hours. BNP (last 3 results) No results for input(s): PROBNP in the last 8760 hours. HbA1C: Recent Labs    08/28/20 0455  HGBA1C 6.0*   CBG: Recent Labs  Lab 08/29/20 1104 08/29/20 1554 08/29/20 2201 08/30/20 0633 08/30/20 1205  GLUCAP 165* 127* 144* 150* 139*   Lipid Profile: Recent Labs    08/28/20 0455  CHOL  99  HDL 40*  LDLCALC 35  TRIG 628  CHOLHDL 2.5   Thyroid Function Tests: Recent Labs    08/24/2020 2356  TSH 1.571   Anemia Panel: Recent Labs    08/26/2020 2356 08/28/20 0416  VITAMINB12 843  --   FOLATE  --  34.7   Sepsis Labs: No results for input(s): PROCALCITON, LATICACIDVEN in the last 168 hours.  Recent Results (from the past 240 hour(s))  Culture, Urine     Status: Abnormal   Collection Time: 08/22/2020  6:56 PM   Specimen: Urine, Random  Result Value Ref Range Status   Specimen Description URINE, RANDOM  Final   Special Requests   Final    NONE Performed at [facility], [address] [place], [place] [address]    Culture MULTIPLE SPECIES PRESENT, SUGGEST RECOLLECTION (A)  Final   Report Status 08/29/2020 FINAL  Final  Resp Panel by RT-PCR (Flu A&B, Covid) Nasopharyngeal Swab     Status: None   Collection  Time: 08/13/2020 10:15 PM   Specimen: Nasopharyngeal Swab; Nasopharyngeal(NP) swabs in vial transport medium  Result Value Ref Range Status   SARS Coronavirus 2 by RT PCR NEGATIVE NEGATIVE Final    Comment: (NOTE) SARS-CoV-2 target nucleic acids are NOT DETECTED.  The SARS-CoV-2 RNA is generally detectable in upper respiratory specimens during the acute phase of infection. The lowest concentration of SARS-CoV-2 viral copies this assay can detect is 138 copies/mL. A negative result does not preclude SARS-Cov-2 infection and should not be used as the sole basis for treatment or other patient management decisions. A negative result may occur with  improper specimen collection/handling, submission of specimen other than nasopharyngeal swab, presence of viral mutation(s) within the areas targeted by this assay, and inadequate number of viral copies(<138 copies/mL). A negative result must be combined with clinical observations, patient history, and epidemiological information. The expected result is Negative.  Fact Sheet for Patients:  BloggerCourse.com  Fact Sheet for Healthcare Providers:  SeriousBroker.it  This test is no t yet approved or cleared by the [place] FDA and  has been authorized for detection and/or diagnosis of SARS-CoV-2 by FDA under an Emergency Use Authorization (EUA). This EUA will remain  in effect (meaning this test can be used) for the duration of the COVID-19 declaration under Section 564(b)(1) of the Act, 21 U.S.C.section 360bbb-3(b)(1), unless the authorization is terminated  or revoked sooner.       Influenza A by PCR NEGATIVE NEGATIVE Final   Influenza B by PCR NEGATIVE NEGATIVE Final    Comment: (NOTE) The Xpert Xpress SARS-CoV-2/FLU/RSV plus assay is intended as an aid in the diagnosis of influenza from Nasopharyngeal swab specimens and should not be used as a sole basis for treatment. Nasal washings  and aspirates are unacceptable for Xpert Xpress SARS-CoV-2/FLU/RSV testing.  Fact Sheet for Patients: BloggerCourse.com  Fact Sheet for Healthcare Providers: SeriousBroker.it  This test is not yet approved or cleared by the [place] FDA and has been authorized for detection and/or diagnosis of SARS-CoV-2 by FDA under an Emergency Use Authorization (EUA). This EUA will remain in effect (meaning this test can be used) for the duration of the COVID-19 declaration under Section 564(b)(1) of the Act, 21 U.S.C. section 360bbb-3(b)(1), unless the authorization is terminated or revoked.  Performed at [facility], [address] [place], [place] [address]   Culture, blood (routine x 2)     Status: None (Preliminary result)   Collection Time:  08/28/20  4:26 AM   Specimen: BLOOD RIGHT HAND  Result Value Ref Range Status   Specimen Description BLOOD RIGHT HAND  Final   Special Requests   Final    BOTTLES DRAWN AEROBIC AND ANAEROBIC Blood Culture adequate volume   Culture   Final    NO GROWTH 1 DAY Performed at [facility], [address], [place], [place] [address]    Report Status PENDING  Incomplete  Culture, blood (routine x 2)     Status: None (Preliminary result)   Collection Time: 08/28/20  4:35 AM   Specimen: BLOOD LEFT WRIST  Result Value Ref Range Status   Specimen Description BLOOD LEFT WRIST  Final   Special Requests   Final    BOTTLES DRAWN AEROBIC AND ANAEROBIC Blood Culture adequate volume   Culture   Final    NO GROWTH 1 DAY Performed at [facility], [place] [address], [place], [place] [address]    Report Status PENDING  Incomplete  Culture, Urine     Status: None   Collection Time: 08/29/20  7:35 AM   Specimen: Urine, Random  Result Value Ref Range Status   Specimen Description URINE, RANDOM  Final   Special Requests NONE  Final   Culture   Final    NO GROWTH Performed at [facility], [place] [address], [place], [place] [address]    Report Status 08/30/2020 FINAL  Final      Radiology Studies: DG Abd 1 View  Result Date: 08/29/2020 CLINICAL DATA:  Abdominal pain and bloating. EXAM: ABDOMEN - 1 VIEW COMPARISON:  None. FINDINGS: The bowel gas pattern is normal, with large amount of air seen throughout the colon. No radio-opaque calculi or other significant radiographic abnormality are seen. Subcentimeter phleboliths are noted within lower pelvis. IMPRESSION: Negative. Electronically Signed   By: Virgina Norfolk M.D.   On: 08/29/2020 22:54   VAS US CAROTID (at [facility] and [facility] only)  Result Date: 08/29/2020 Carotid Arterial Duplex Study Indications:  Recent fall with progressive decline of mental status. Risk Factors: Hypertension, hyperlipidemia, Diabetes. Performing Technologist: Rogelia Rohrer  Examination Guidelines: A complete evaluation includes B-mode imaging, spectral Doppler, color Doppler, and power Doppler as needed of all accessible portions of each vessel. Bilateral testing is considered an integral part of a complete examination. Limited examinations for reoccurring indications may be performed as noted.  Right Carotid Findings: +----------+--------+--------+--------+------------------+--------+           PSV cm/sEDV cm/sStenosisPlaque DescriptionComments +----------+--------+--------+--------+------------------+--------+ CCA Prox  97      5                                          +----------+--------+--------+--------+------------------+--------+ CCA Distal122     28                                         +----------+--------+--------+--------+------------------+--------+ ICA Prox  87      23      1-39%                              +----------+--------+--------+--------+------------------+--------+ ECA       112     0                                          +----------+--------+--------+--------+------------------+--------+  +----------+--------+-------+----------------+-------------------+  PSV cm/sEDV cmsDescribe        Arm Pressure (mmHG) +----------+--------+-------+----------------+-------------------+ [id number]     0      Multiphasic, WNL                    +----------+--------+-------+----------------+-------------------+ +---------+--------+--+--------+--+---------+ VertebralPSV cm/s58EDV cm/s10Antegrade +---------+--------+--+--------+--+---------+  Left Carotid Findings: +----------+--------+--------+--------+------------------+--------+           PSV cm/sEDV cm/sStenosisPlaque DescriptionComments +----------+--------+--------+--------+------------------+--------+ CCA Prox  104     0                                          +----------+--------+--------+--------+------------------+--------+ CCA Distal100     12                                         +----------+--------+--------+--------+------------------+--------+ ICA Prox  53      12      1-39%                              +----------+--------+--------+--------+------------------+--------+ ICA Distal77      17                                         +----------+--------+--------+--------+------------------+--------+ ECA       63      0                                          +----------+--------+--------+--------+------------------+--------+ +----------+--------+--------+----------------+-------------------+           PSV cm/sEDV cm/sDescribe        Arm Pressure (mmHG) +----------+--------+--------+----------------+-------------------+ [id number]      0       Multiphasic, WNL                    +----------+--------+--------+----------------+-------------------+ +---------+--------+--+--------+--+---------+ VertebralPSV cm/s65EDV cm/s17Antegrade +---------+--------+--+--------+--+---------+   Summary: Right Carotid: Velocities in the right ICA are consistent with a 1-39%  stenosis. Left Carotid: Velocities in the left ICA are consistent with a 1-39% stenosis. Vertebrals:  Bilateral vertebral arteries demonstrate antegrade flow. Subclavians: Normal flow hemodynamics were seen in bilateral subclavian              arteries. *See table(s) above for measurements and observations.  Electronically signed by Servando Snare MD on 08/29/2020 at 7:59:00 AM.    Final     Scheduled Meds: .  stroke: mapping our early stages of recovery book   Does not apply Once  . aspirin  81 mg Oral Daily  . clopidogrel  75 mg Oral Daily  . insulin aspart  0-9 Units Subcutaneous TID WC  . lenalidomide  10 mg Oral Daily  . losartan  25 mg Oral QHS  . polyethylene glycol  17 g Oral Daily  . rosuvastatin  10 mg Oral q1800  . senna-docusate  1 tablet Oral BID  . vitamin B-12  1,000 mcg Oral Daily   Continuous Infusions: . cefTRIAXone (ROCEPHIN)  IV Stopped (08/29/20 2202)     LOS: 2 days   Time spent: 35 minutes  Lenox Bink Loann Quill, MD [facility]  If 7PM-7AM, please contact night-coverage [email or web address] 08/30/2020, 1:23 PM

## 2020-08-30 NOTE — TOC Progression Note (Signed)
Transition of Care [facility]) - Progression Note    Patient Details  Name: Kara Griffin MRN: [id number] Date of Birth: 08-Jan-1936  Transition of Care [facility]) CM/SW [place], [place] Phone Number: 08/30/2020, 11:27 AM  Clinical Narrative:    CSW started insurance authorization. Only [place] has offered on this pt and they wanted a facility in [place]. CSW also requested new PT/OT evals. SW will continue to follow for DC planning.   Expected Discharge Plan: [place] Barriers to Discharge: SNF Pending bed offer,Insurance Authorization,Continued Medical Work up  Expected Discharge Plan and Services Expected Discharge Plan: [place] Choice: [place] arrangements for the past 2 months: Single Family Home                                       Social Determinants of Health (SDOH) Interventions    Readmission Risk Interventions No flowsheet data found.

## 2020-08-31 DIAGNOSIS — E1165 Type 2 diabetes mellitus with hyperglycemia: Secondary | ICD-10-CM | POA: Diagnosis not present

## 2020-08-31 LAB — BASIC METABOLIC PANEL
Anion gap: 10 (ref 5–15)
BUN: 9 mg/dL (ref 8–23)
CO2: 24 mmol/L (ref 22–32)
Calcium: 8.7 mg/dL — ABNORMAL LOW (ref 8.9–10.3)
Chloride: 99 mmol/L (ref 98–111)
Creatinine, Ser: 0.64 mg/dL (ref 0.44–1.00)
GFR, Estimated: >60 mL/min (ref >60)
Glucose, Bld: 165 mg/dL — ABNORMAL HIGH (ref 70–99)
Potassium: 3.5 mmol/L (ref 3.5–5.1)
Sodium: 133 mmol/L — ABNORMAL LOW (ref 135–145)

## 2020-08-31 LAB — CBC
HCT: 27.7 % — ABNORMAL LOW (ref 36.0–46.0)
Hemoglobin: 9.1 g/dL — ABNORMAL LOW (ref 12.0–15.0)
MCH: 29.4 pg (ref 26.0–34.0)
MCHC: 32.9 g/dL (ref 30.0–36.0)
MCV: 89.4 fL (ref 80.0–100.0)
Platelets: 117 10*3/uL — ABNORMAL LOW (ref 150–400)
RBC: 3.1 MIL/uL — ABNORMAL LOW (ref 3.87–5.11)
RDW: 15.9 % — ABNORMAL HIGH (ref 11.5–15.5)
WBC: 2.3 10*3/uL — ABNORMAL LOW (ref 4.0–10.5)
nRBC: 0 % (ref 0.0–0.2)

## 2020-08-31 LAB — GLUCOSE, CAPILLARY
Glucose-Capillary: 196 mg/dL — ABNORMAL HIGH (ref 70–99)
Glucose-Capillary: 141 mg/dL — ABNORMAL HIGH (ref 70–99)
Glucose-Capillary: 187 mg/dL — ABNORMAL HIGH (ref 70–99)
Glucose-Capillary: 167 mg/dL — ABNORMAL HIGH (ref 70–99)
Glucose-Capillary: 157 mg/dL — ABNORMAL HIGH (ref 70–99)

## 2020-08-31 LAB — RESP PANEL BY RT-PCR (FLU A&B, COVID) ARPGX2
Influenza A by PCR: NEGATIVE
Influenza B by PCR: NEGATIVE
SARS Coronavirus 2 by RT PCR: NEGATIVE

## 2020-08-31 MED ORDER — MECLIZINE HCL 12.5 MG PO TABS
12.5000 mg | ORAL_TABLET | Freq: Three times a day (TID) | ORAL | Status: DC | PRN
  Administered 2020-08-31: 16:00:00 12.5 mg via ORAL
  Filled 2020-08-31: qty 1

## 2020-08-31 MED ORDER — STROKE: EARLY STAGES OF RECOVERY BOOK
Status: AC
  Administered 2020-08-31: 09:00:00 1
  Filled 2020-08-31: qty 1

## 2020-08-31 NOTE — TOC Progression Note (Signed)
Transition of Care [facility]) - Progression Note    Patient Details  Name: Kara Griffin MRN: [id number] Date of Birth: [birth year]  Transition [facility] Care [facility]) CM/SW Contact  Mearl Latin, LCSW Phone Number: 08/31/2020, 3:38 PM  Clinical Narrative:    CSW received denials from [facility], [address], [address], [place] [facility], [place], and [facility]. No other [place] SNF options available. Will follow up with [facility] as patient's Revlim[place] is a current barrier.    Expected Discharge Plan: Skilled Nursing Facility Barriers to Discharge: SNF Pending bed offer,Insurance Authorization,Continued Medical Work up  Expected Discharge Plan and Services Expected Discharge Plan: Skilled Nursing Facility     Post Acute Care Choice: Skilled Nursing Facility Living arrangements for the past 2 months: Single Family Home                                       Social Determinants of Health (SDOH) Interventions    Readmission Risk Interventions No flowsheet data found.

## 2020-08-31 NOTE — Progress Notes (Signed)
Occupational Therapy Treatment Patient Details Name: Kara Griffin MRN: [id number] DOB: 26-Nov-1935 Today's Date: 08/31/2020    History of present illness 84 y.o. female with history of myelodysplastic syndrome being followed by Dr. Lowell Griffin at [facility] on [place], pancytopenia, diabetes mellitus, hypertension and hyperlipidemia was brought to the ED by patient's son after patient was found to be increasingly confused last 2 days. On August 19, 2020, patient was taken to [facility] after patient had a fall at home. CT and xray were negative during that ED visit and pt was d/c'd home. Pt underwent MRI 12/23 which revealed small R bagal ganglia lacunar infarct.   OT comments  Son present during session and asking for resource to address patients "depression". Pt very flat affect throughout session and back pain / dizziness limiting session. Pt reports "dizziness' but unable to describe without using the word dizziness. Pt noted to have drop in SBP 10 points during session. Pt / family report 2 days in the bed except for 3n1 transfers. Pt encouraged oob for all meals and 3n1 for toileting during waking hours. Recommendations SNF at this time.    Follow Up Recommendations  SNF    Equipment Recommendations  Wheelchair cushion (measurements OT);Wheelchair (measurements OT);Hospital bed;3 in 1 bedside commode (if dc home)    Recommendations for Other Services      Precautions / Restrictions Precautions Precautions: Fall Restrictions Weight Bearing Restrictions: No       Mobility Bed Mobility Overal bed mobility: Needs Assistance Bed Mobility: Supine to Sit     Supine to sit: Min assist;+2 for safety/equipment;HOB elevated     General bed mobility comments: Min +2 for trunk and LE management, pt able to scoot to EOB with increased time and effort.  Transfers Overall transfer level: Needs assistance Equipment used: Straight cane;Rolling walker (2 wheeled) Transfers: Sit to/from  Stand Sit to Stand: Min assist;+2 physical assistance;+2 safety/equipment         General transfer comment: Min +2 for power up, steadying, and cues for hand placement when rising and sitting. STS x3, from EOB each trial. First stand attempt with SPC, pt very unsteady so opted for RW.    Balance Overall balance assessment: Needs assistance Sitting-balance support: Feet supported;No upper extremity supported Sitting balance-Leahy Scale: Poor (to fair) Sitting balance - Comments: Intermittent posterior assist to maintian upright sitting   Standing balance support: Bilateral upper extremity supported;During functional activity Standing balance-Leahy Scale: Poor Standing balance comment: reliant on external support                           ADL either performed or assessed with clinical judgement   ADL Overall ADL's : Needs assistance/impaired Eating/Feeding: Set up;Sitting   Grooming: Brushing hair;Minimal assistance;Sitting Grooming Details (indicate cue type and reason): only brushing R side of hair                 Toilet Transfer: Moderate assistance;+2 for physical assistance;Ambulation           Functional mobility during ADLs: Moderate assistance;+2 for safety/equipment;+2 for physical assistance;Cane       Vision       Perception     Praxis      Cognition Arousal/Alertness: Awake/alert Behavior During Therapy: WFL for tasks assessed/performed Overall Cognitive Status: Impaired/Different from baseline Area of Impairment: Attention;Following commands;Safety/judgement;Problem solving                   Current Attention  Level: Selective   Following Commands: Follows one step commands with increased time Safety/Judgement: Decreased awareness of safety;Decreased awareness of deficits   Problem Solving: Requires verbal cues;Requires tactile cues;Slow processing General Comments: requires cues throughout mobility for safety, pt very flat  affect for majority of session        Exercises     Shoulder Instructions       General Comments SBP dropped 10 points during session and reports "dizziness"    Pertinent Vitals/ Pain       Pain Assessment: Faces Faces Pain Scale: Hurts little more Pain Location: low back (since fall) Pain Descriptors / Indicators: Guarding;Moaning;Grimacing;Discomfort Pain Intervention(s): Limited activity within patient's tolerance;Monitored during session;Repositioned  Home Living                                          Prior Functioning/Environment              Frequency  Min 2X/week        Progress Toward Goals  OT Goals(current goals can now be found in the care plan section)  Progress towards OT goals: Progressing toward goals  Acute Rehab OT Goals Patient Stated Goal: son Kara Griffin present OT Goal Formulation: With patient Time For Goal Achievement: 09/11/20 Potential to Achieve Goals: Good ADL Goals Pt Will Perform Grooming: with set-up;with supervision;standing Pt Will Perform Upper Body Dressing: with set-up;with supervision;sitting Pt Will Perform Lower Body Dressing: with min guard assist;sitting/lateral leans;sit to/from stand;with adaptive equipment Pt Will Transfer to Toilet: with min guard assist;ambulating;bedside commode Pt Will Perform Toileting - Clothing Manipulation and hygiene: with min guard assist;sitting/lateral leans;sit to/from stand Additional ADL Goal #1: Pt will demonstrate selective attention during grooming task with Min cues  Plan Discharge plan remains appropriate    Co-evaluation    PT/OT/SLP Co-Evaluation/Treatment: Yes Reason for Co-Treatment: For patient/therapist safety PT goals addressed during session: Mobility/safety with mobility;Balance;Proper use of DME OT goals addressed during session: ADL's and self-care;Proper use of Adaptive equipment and DME;Other (comment)      AM-PAC OT "6 Clicks" Daily Activity      Outcome Measure   Help from another person eating meals?: A Little Help from another person taking care of personal grooming?: A Little Help from another person toileting, which includes using toliet, bedpan, or urinal?: A Lot Help from another person bathing (including washing, rinsing, drying)?: A Lot Help from another person to put on and taking off regular upper body clothing?: A Little Help from another person to put on and taking off regular lower body clothing?: A Lot 6 Click Score: 15    End of Session Equipment Utilized During Treatment: Gait belt;Rolling walker  OT Visit Diagnosis: Unsteadiness on feet (R26.81);Other abnormalities of gait and mobility (R26.89);Muscle weakness (generalized) (M62.81);History of falling (Z91.81);Pain   Activity Tolerance Patient limited by fatigue;Patient limited by pain   Patient Left in chair;with call bell/phone within reach;with chair alarm set;with family/visitor present   Nurse Communication Mobility status;Precautions        Time: 4132-4401 OT Time Calculation (min): 25 min  Charges: OT General Charges $OT Visit: 1 Visit OT Treatments $Self Care/Home Management : 8-22 mins   Brynn, OTR/L  Acute Rehabilitation Services Pager: [phone] Office: [phone] .    Mateo Flow 08/31/2020, 3:19 PM

## 2020-08-31 NOTE — TOC Progression Note (Signed)
Transition of Care [facility]) - Progression Note    Patient Details  Name: Kara Griffin MRN: [id number] Date of Birth: 21-Dec-1935  [facility]) CM/SW Contact  Mearl Latin, LCSW Phone Number: 08/31/2020, 9:56 AM  Clinical Narrative:    CSW awaiting response from [employer] and [facility] and then CSW can let insurance know bed choice. Patient will require COVID test.    Expected Discharge Plan: Skilled Nursing Facility Barriers to Discharge: SNF Pending bed offer,Insurance Authorization,Continued Medical Work up  Expected Discharge Plan and Services Expected Discharge Plan: Skilled Nursing Facility     Post Acute Care Choice: Skilled Nursing Facility Living arrangements for the past 2 months: Single Family Home                                       Social Determinants of Health (SDOH) Interventions    Readmission Risk Interventions No flowsheet data found.

## 2020-08-31 NOTE — Care Management Important Message (Signed)
Important Message  Patient Details  Name: Kara Griffin MRN: [id number] Date of Birth: 04-Mar-1936   Medicare Important Message Given:  Yes     [name] 08/31/2020, 2:50 PM

## 2020-08-31 NOTE — Telephone Encounter (Signed)
Called and spoke to [place]. Kara Griffin states that Kara Griffin is doing much better since being admitted to the hospital. They have placed her on antibiotics for a UTI and her mental state has cleared up. Kara Griffin states that she is still admitted and will be attending in patient rehab for her weakened physical state.

## 2020-08-31 NOTE — Progress Notes (Signed)
PROGRESS NOTE    Kara Griffin  CVE:[id number] DOB: [birth year] DOA: 08/17/2020 PCP: Einar Pheasant, MD   Brief Narrative:  Patient is 84 year old female with past medical history of myelodysplastic syndrome-followed by Dr. Florene Glen at [facility] on Revlimid, pancytopenia, diabetes mellitus, hypertension, hyperlipidemia presents to emergency department with increasingly confusion since 2 days.Patient son states that since Thanksgiving almost a month and a half ago patient has become gradually weak and not herself.  Recently her blood pressure was found to be low and her primary care physician decreased her antihypertensives during the recent visit 2 days ago.  Patient also has chronic dizziness.  On August 19, 2020 about 8 days ago patient was taken to [facility] after patient had a fall at home.  Over the CT head C-spine and abdomen was done which showed features concerning for right-sided mucous plugging and MAI features and was given Z-Pak.  Following which patient had some couple of episodes of diarrhea.  Has not recorded any fever at home.  Has been having poor appetite with some nausea.  CT scan also at abdomen showed some gallbladder distention with no definite signs of any inflammation.  ED Course: In the ER patient is generally weak oriented to name and place.  Moving all extremities.  At the time of my exam was afebrile not hypoxic Covid test was negative CBC shows pancytopenia at baseline high-sensitivity and was 8 and blood glucose of 161 sodium 132.  LFTs were normal.  MRI brain has been ordered and admitted for acute encephalopathy with generalized weakness and poor appetite.  Assessment & Plan:   Acute right basal ganglia lacunar infarct: -Reviewed MRI brain. -Allow permissive hypertension. -Echo shows ejection fraction more than 02%, grade 1 diastolic dysfunction, -Carotid Doppler: 1 to 39% stenosis in the right and left carotid arteries. -A1c 6.0%, lipid panel:  WNL -Neurology recommended aspirin 81 mg and Plavix 75 mg daily for 3 weeks followed by aspirin alone.  Continue statin.  Appreciate neurology recommendations. -Consult PT/OT/SLP-recommend SNF-await bed availability/insurance authorization.  Acute metabolic encephalopathy: Improving -Likely in the setting of UTI.  UA looks infected.  Patient had fever of 100.5 in ED.  Patient started on broad-spectrum antibiotics. -Patient remained afebrile.  Blood culture so far negative.   -B12, folate, TSH, ammonia level, RPR: All came back within normal limits. -EEG negative for seizures -CT renal stone study, right upper quadrant ultrasound, chest x-ray: Negative for acute findings.  COVID-19 negative.   -Initial urine culture shows multiple species.  Repeat urine culture came back negative. -Received IV antibiotics for 3 days.  Dizziness/Nausea: -Eer etiology?-as she has same issues in the past & followed by ENT outpatient -On Ear exam she has wax in Right ear-Left ear-small amount of wax-TM-intact & non erythematous. -Meclizine & Zofran as needed -May need ENT eval outpatient if symptoms persists  Hypertension: -Cont. Losartan  Hyperlipidemia: Continue statin  Type 2 diabetes mellitus: Well controlled.  A1c 6.0%.  Continue sliding scale insulin.  Myelodysplastic syndrome and pancytopenia: -Followed by oncologist Dr. Florene Glen at [facility]. -WBC/H&H/platelet: At baseline.  Continue to monitor.   -Continue Revlimid  Hypokalemia: Resolved  Hyponatremia: Sodium 132-132.  Patient is asymptomatic.  Continue to monitor.  Constipation/Bloating: Ordered MiraLAX and Dulcolax suppository -had BM, Xray abdomen on 12/25: negative for acute findings  Patient is doing well.  Await SNF bed placement and insurance authorization.  DVT prophylaxis: SCD Code Status: Full code Family Communication: Patient's son present at bedside.  Plan of care discussed with  patient in length and she verbalized  understanding and agreed with it. Disposition Plan: SNF tomorrow  Consultants:   Neurology  Procedures:   MRI/MRA brain  EEG  Carotid Doppler  CT renal study  Right upper quadrant ultrasound  Antimicrobials:   Cefepime  Vancomycin  Rocephin  Status is: Inpatient   Dispo: The patient is from: Home              Anticipated d/c is to: SNF              Anticipated d/c date is: 1 day              Patient currently is not medically stable to d/c.    Subjective: Patient seen and examined.  Son at bedside.  Pt reports dizziness. Son concerned about poor appetite & dizziness. Remained afebrile, no acute events overnight. No HA, CP, SOB, vomiting or urinary symptoms.  Objective: Vitals:   08/31/20 0421 08/31/20 0820 08/31/20 1134 08/31/20 1553  BP: (!) 151/56 133/73 135/61 (!) 159/57  Pulse: 82 87 73 77  Resp: 17 18  16   Temp: 98.8 F (37.1 C) 98.8 F (37.1 C) 98.4 F (36.9 C) 97.9 F (36.6 C)  TempSrc: Oral Oral Oral Oral  SpO2: 97% 98% 97% 99%    Intake/Output Summary (Last 24 hours) at 08/31/2020 1559 Last data filed at 08/30/2020 2151 Gross per 24 hour  Intake 50 ml  Output 150 ml  Net -100 ml   There were no vitals filed for this visit.  Examination:  General exam: Appears calm and comfortable, on room air, elderly, communicating well and following commands. Right ear: Full of wax-could not evaluate TM Left ear: small amount of wax-TM-intact & non erythematous. Respiratory system: Clear to auscultation. Respiratory effort normal. Cardiovascular system: S1 & S2 heard, RRR. No JVD, murmurs, rubs, gallops or clicks. No pedal edema. Gastrointestinal system: Abdomen is nondistended, soft and nontender. No organomegaly or masses felt. Normal bowel sounds heard. Central nervous system: Alert and oriented x3.  Following commands.  Moves all extremities equally. Skin: No rashes, lesions or ulcers    Data Reviewed: I have personally reviewed following  labs and imaging studies  CBC: Recent Labs  Lab 08/08/2020 1814 08/29/20 0324 08/30/20 0225 08/31/20 0246  WBC 3.4* 2.4* 2.5* 2.3*  HGB 11.2* 9.3* 9.8* 9.1*  HCT 34.5* 26.5* 29.5* 27.7*  MCV 89.6 88.0 87.5 89.4  PLT 141* 115* 125* 123XX123*   Basic Metabolic Panel: Recent Labs  Lab 08/08/2020 1814 08/29/20 0324 08/30/20 0225 08/31/20 0246  NA 132* 133* 132* 133*  K 3.5 3.1* 3.8 3.5  CL 96* 101 101 99  CO2 24 21* 22 24  GLUCOSE 161* 141* 143* 165*  BUN 13 9 7* 9  CREATININE 0.95 0.76 0.64 0.64  CALCIUM 9.7 8.2* 8.5* 8.7*  MG  --  1.6*  --   --    GFR: Estimated Creatinine Clearance: 49.2 mL/min (by C-G formula based on SCr of 0.64 mg/dL). Liver Function Tests: Recent Labs  Lab 08/06/2020 1814  AST 11*  ALT 15  ALKPHOS 58  BILITOT 0.8  PROT 6.8  ALBUMIN 3.8   No results for input(s): LIPASE, AMYLASE in the last 168 hours. Recent Labs  Lab 09/02/2020 2356  AMMONIA 11   Coagulation Profile: No results for input(s): INR, PROTIME in the last 168 hours. Cardiac Enzymes: No results for input(s): CKTOTAL, CKMB, CKMBINDEX, TROPONINI in the last 168 hours. BNP (last 3 results) No results for  input(s): PROBNP in the last 8760 hours. HbA1C: No results for input(s): HGBA1C in the last 72 hours. CBG: Recent Labs  Lab 08/30/20 2203 08/31/20 0911 08/31/20 1129 08/31/20 1456 08/31/20 1557  GLUCAP 139* 196* 141* 187* 167*   Lipid Profile: No results for input(s): CHOL, HDL, LDLCALC, TRIG, CHOLHDL, LDLDIRECT in the last 72 hours. Thyroid Function Tests: No results for input(s): TSH, T4TOTAL, FREET4, T3FREE, THYROIDAB in the last 72 hours. Anemia Panel: No results for input(s): VITAMINB12, FOLATE, FERRITIN, TIBC, IRON, RETICCTPCT in the last 72 hours. Sepsis Labs: No results for input(s): PROCALCITON, LATICACIDVEN in the last 168 hours.  Recent Results (from the past 240 hour(s))  Culture, Urine     Status: Abnormal   Collection Time: 2020-08-03  6:56 PM   Specimen:  Urine, Random  Result Value Ref Range Status   Specimen Description URINE, RANDOM  Final   Special Requests   Final    NONE Performed at [facility], [address] [place], [place] [address]    Culture MULTIPLE SPECIES PRESENT, SUGGEST RECOLLECTION (A)  Final   Report Status 08/29/2020 FINAL  Final  Resp Panel by RT-PCR (Flu A&B, Covid) Nasopharyngeal Swab     Status: None   Collection Time: 2020-08-03 10:15 PM   Specimen: Nasopharyngeal Swab; Nasopharyngeal(NP) swabs in vial transport medium  Result Value Ref Range Status   SARS Coronavirus 2 by RT PCR NEGATIVE NEGATIVE Final    Comment: (NOTE) SARS-CoV-2 target nucleic acids are NOT DETECTED.  The SARS-CoV-2 RNA is generally detectable in upper respiratory specimens during the acute phase of infection. The lowest concentration of SARS-CoV-2 viral copies this assay can detect is 138 copies/mL. A negative result does not preclude SARS-Cov-2 infection and should not be used as the sole basis for treatment or other patient management decisions. A negative result may occur with  improper specimen collection/handling, submission of specimen other than nasopharyngeal swab, presence of viral mutation(s) within the areas targeted by this assay, and inadequate number of viral copies(<138 copies/mL). A negative result must be combined with clinical observations, patient history, and epidemiological information. The expected result is Negative.  Fact Sheet for Patients:  BloggerCourse.comhttps://www.fda.gov/media/152166/download  Fact Sheet for Healthcare Providers:  SeriousBroker.ithttps://www.fda.gov/media/152162/download  This test is no t yet approved or cleared by the [place] FDA and  has been authorized for detection and/or diagnosis of SARS-CoV-2 by FDA under an Emergency Use Authorization (EUA). This EUA will remain  in effect (meaning this test can be used) for the duration of the COVID-19 declaration under Section 564(b)(1) of the Act,  21 U.S.C.section 360bbb-3(b)(1), unless the authorization is terminated  or revoked sooner.       Influenza A by PCR NEGATIVE NEGATIVE Final   Influenza B by PCR NEGATIVE NEGATIVE Final    Comment: (NOTE) The Xpert Xpress SARS-CoV-2/FLU/RSV plus assay is intended as an aid in the diagnosis of influenza from Nasopharyngeal swab specimens and should not be used as a sole basis for treatment. Nasal washings and aspirates are unacceptable for Xpert Xpress SARS-CoV-2/FLU/RSV testing.  Fact Sheet for Patients: BloggerCourse.comhttps://www.fda.gov/media/152166/download  Fact Sheet for Healthcare Providers: SeriousBroker.ithttps://www.fda.gov/media/152162/download  This test is not yet approved or cleared by the [place] FDA and has been authorized for detection and/or diagnosis of SARS-CoV-2 by FDA under an Emergency Use Authorization (EUA). This EUA will remain in effect (meaning this test can be used) for the duration of the COVID-19 declaration under Section 564(b)(1) of the Act, 21 U.S.C. section 360bbb-3(b)(1), unless the  authorization is terminated or revoked.  Performed at [facility], [place] [address], [place], [place] [address]   Culture, blood (routine x 2)     Status: None (Preliminary result)   Collection Time: 08/28/20  4:26 AM   Specimen: BLOOD RIGHT HAND  Result Value Ref Range Status   Specimen Description BLOOD RIGHT HAND  Final   Special Requests   Final    BOTTLES DRAWN AEROBIC AND ANAEROBIC Blood Culture adequate volume   Culture   Final    NO GROWTH 3 DAYS Performed at [facility], [place] [address] [place], [place] [address]    Report Status PENDING  Incomplete  Culture, blood (routine x 2)     Status: None (Preliminary result)   Collection Time: 08/28/20  4:35 AM   Specimen: BLOOD LEFT WRIST  Result Value Ref Range Status   Specimen Description BLOOD LEFT WRIST  Final   Special Requests   Final    BOTTLES DRAWN AEROBIC AND ANAEROBIC Blood Culture adequate volume    Culture   Final    NO GROWTH 3 DAYS Performed at [facility], [place] [address] [place], [place] [address]    Report Status PENDING  Incomplete  Culture, Urine     Status: None   Collection Time: 08/29/20  7:35 AM   Specimen: Urine, Random  Result Value Ref Range Status   Specimen Description URINE, RANDOM  Final   Special Requests NONE  Final   Culture   Final    NO GROWTH Performed at [facility], [place] [address] [place],  [address]    Report Status 08/30/2020 FINAL  Final  Resp Panel by RT-PCR (Flu A&B, Covid) Nasopharyngeal Swab     Status: None   Collection Time: 08/31/20 12:10 PM   Specimen: Nasopharyngeal Swab; Nasopharyngeal(NP) swabs in vial transport medium  Result Value Ref Range Status   SARS Coronavirus 2 by RT PCR NEGATIVE NEGATIVE Final    Comment: (NOTE) SARS-CoV-2 target nucleic acids are NOT DETECTED.  The SARS-CoV-2 RNA is generally detectable in upper respiratory specimens during the acute phase of infection. The lowest concentration of SARS-CoV-2 viral copies this assay can detect is 138 copies/mL. A negative result does not preclude SARS-Cov-2 infection and should not be used as the sole basis for treatment or other patient management decisions. A negative result may occur with  improper specimen collection/handling, submission of specimen other than nasopharyngeal swab, presence of viral mutation(s) within the areas targeted by this assay, and inadequate number of viral copies(<138 copies/mL). A negative result must be combined with clinical observations, patient history, and epidemiological information. The expected result is Negative.  Fact Sheet for Patients:  EntrepreneurPulse.com.au  Fact Sheet for Healthcare Providers:  IncredibleEmployment.be  This test is no t yet approved or cleared by the [place] FDA and  has been authorized for detection and/or diagnosis of SARS-CoV-2 by FDA  under an Emergency Use Authorization (EUA). This EUA will remain  in effect (meaning this test can be used) for the duration of the COVID-19 declaration under Section 564(b)(1) of the Act, 21 U.S.C.section 360bbb-3(b)(1), unless the authorization is terminated  or revoked sooner.       Influenza A by PCR NEGATIVE NEGATIVE Final   Influenza B by PCR NEGATIVE NEGATIVE Final    Comment: (NOTE) The Xpert Xpress SARS-CoV-2/FLU/RSV plus assay is intended as an aid in the diagnosis of influenza from Nasopharyngeal swab specimens and should not be  used as a sole basis for treatment. Nasal washings and aspirates are unacceptable for Xpert Xpress SARS-CoV-2/FLU/RSV testing.  Fact Sheet for Patients: EntrepreneurPulse.com.au  Fact Sheet for Healthcare Providers: IncredibleEmployment.be  This test is not yet approved or cleared by the [place] FDA and has been authorized for detection and/or diagnosis of SARS-CoV-2 by FDA under an Emergency Use Authorization (EUA). This EUA will remain in effect (meaning this test can be used) for the duration of the COVID-19 declaration under Section 564(b)(1) of the Act, 21 U.S.C. section 360bbb-3(b)(1), unless the authorization is terminated or revoked.  Performed at [facility], [place] [address] [place], [place] [address]       Radiology Studies: DG Abd 1 View  Result Date: 08/29/2020 CLINICAL DATA:  Abdominal pain and bloating. EXAM: ABDOMEN - 1 VIEW COMPARISON:  None. FINDINGS: The bowel gas pattern is normal, with large amount of air seen throughout the colon. No radio-opaque calculi or other significant radiographic abnormality are seen. Subcentimeter phleboliths are noted within lower pelvis. IMPRESSION: Negative. Electronically Signed   By: Virgina Norfolk M.D.   On: 08/29/2020 22:54    Scheduled Meds: . aspirin  81 mg Oral Daily  . clopidogrel  75 mg Oral Daily  . insulin aspart  0-9 Units  Subcutaneous TID WC  . lenalidomide  10 mg Oral Daily  . losartan  25 mg Oral QHS  . polyethylene glycol  17 g Oral Daily  . rosuvastatin  10 mg Oral q1800  . senna-docusate  1 tablet Oral BID  . vitamin B-12  1,000 mcg Oral Daily   Continuous Infusions:    LOS: 3 days   Time spent: 35 minutes  Noble Cicalese Loann Quill, MD [facility]  If 7PM-7AM, please contact night-coverage [email or web address] 08/31/2020, 3:59 PM

## 2020-08-31 NOTE — Progress Notes (Signed)
  Speech Language Pathology Treatment: Cognitive-Linquistic  Patient Details Name: Kara Griffin MRN: [id number] DOB: [birth year] Today's Date: 08/31/2020 Time: 5573-2202 SLP Time Calculation (min) (ACUTE ONLY): 25 min  Assessment / Plan / Recommendation Clinical Impression  Kara Griffin demonstrates improved, but ongoing cognitive impairment. She was re-evaluated for recommendations given upcoming discharge. She previously lived independently, driving, handling finances, etc. She presently tested in moderate cognitive impairment range on the [facility] Mental Status Examination with a total core of 20/30, with 27+/30 being WNL. Deficits were most notable in the areas of memory, working memory/attention, and thought organization. Clock drawing was WNL. She demonstrates good insight into her deficits and was oriented x4. She was able to problem solve the need for further care, given deficits, and verbalized, "I don't want to be forgetting my medicine or something important." She is presently recommended for short term rehab at SNF level, which appears appropriate. At this time, would not recommend pt handling finances, driving, meds, independently. No further ST indicated on acute level. Recommend follow up ST at SNF.    HPI HPI: Kara Griffin is a 84 y.o. female with PMH significant for DM2, HTN, Myelodysplastic syndrome, HLD who is admitted with dizziness and falls that have been progressively worsening for the last month. She was brought in by family for worsening confusion and now in her bed all day x 2 days.Kara Griffin reports the patient has had some symptoms of gastroenteritis with nausea, has not thrown up and some diarrhea. MRI brain demonstrated an acute right basal ganglia lacunar infarct. Neurologist feels Stroke is an incidental finding, unrelated to waxing and waning mentation.      SLP Plan  Continue with current plan of care       Recommendations   Further ST  at SNF.                Oral Care Recommendations: Oral care BID SLP Visit Diagnosis: Cognitive communication deficit (R42.706) Plan: Continue with current plan of care       GO              Sladen Plancarte P. Demetrius Mahler, M.S., CCC-SLP Speech-Language Pathologist Acute Rehabilitation Services Pager: [phone]   Susanne Borders Jerica Creegan 08/31/2020, 11:25 AM

## 2020-08-31 NOTE — Progress Notes (Signed)
Physical Therapy Treatment Patient Details Name: Kara Griffin MRN: [id number] DOB: 08/17/1936 Today's Date: 08/31/2020    History of Present Illness 84 y.o. female with history of myelodysplastic syndrome being followed by Dr. Florene Glen at [facility] on Revlimid, pancytopenia, diabetes mellitus, hypertension and hyperlipidemia was brought to the ED by patient's son after patient was found to be increasingly confused last 2 days. On August 19, 2020, patient was taken to [facility] after patient had a fall at home. CT and xray were negative during that ED visit and pt was d/c'd home. Pt underwent MRI 12/23 which revealed small R bagal ganglia lacunar infarct.    PT Comments    Pt reporting dizziness (also described as lightheadedness) throughout mobility, with 10 pt drop in SBP but not technically orthostatic. Pt requiring min-mod +2 assist for mobility tasks today, mostly during gait as pt with poor control of RW and is moderately unsteady in standing. Pt continues to present with low activity tolerance as well. PT continuing to recommend SNF level of care post-acutely, will continue to follow.    Follow Up Recommendations  SNF;Supervision/Assistance - 24 hour     Equipment Recommendations  Wheelchair (measurements PT);Wheelchair cushion (measurements PT);Hospital bed (if pt d/c's home)    Recommendations for Other Services       Precautions / Restrictions Precautions Precautions: Fall Restrictions Weight Bearing Restrictions: No    Mobility  Bed Mobility Overal bed mobility: Needs Assistance Bed Mobility: Supine to Sit     Supine to sit: Min assist;+2 for safety/equipment;HOB elevated     General bed mobility comments: Min +2 for trunk and LE management, pt able to scoot to EOB with increased time and effort.  Transfers Overall transfer level: Needs assistance Equipment used: Straight cane;Rolling walker (2 wheeled) Transfers: Sit to/from Stand Sit to Stand: Min  assist;+2 physical assistance;+2 safety/equipment         General transfer comment: Min +2 for power up, steadying, and cues for hand placement when rising and sitting. STS x3, from EOB each trial. First stand attempt with SPC, pt very unsteady so opted for RW.  Ambulation/Gait Ambulation/Gait assistance: Mod assist;+2 physical assistance Gait Distance (Feet): 15 Feet (+10) Assistive device: Rolling walker (2 wheeled) Gait Pattern/deviations: Step-through pattern;Decreased stride length;Trunk flexed;Staggering right Gait velocity: decr   General Gait Details: Mod +2 for steadying, physically maneuvering RW, correcting unsteadiness. Increasing LE buckling with fatigue, mostly pt-corrected.   Stairs             Wheelchair Mobility    Modified Rankin (Stroke Patients Only) Modified Rankin (Stroke Patients Only) Pre-Morbid Rankin Score: No significant disability Modified Rankin: Moderately severe disability     Balance Overall balance assessment: Needs assistance Sitting-balance support: Feet supported;No upper extremity supported Sitting balance-Leahy Scale: Poor (to fair) Sitting balance - Comments: Intermittent posterior assist to maintian upright sitting   Standing balance support: Bilateral upper extremity supported;During functional activity Standing balance-Leahy Scale: Poor Standing balance comment: reliant on external support                            Cognition Arousal/Alertness: Awake/alert Behavior During Therapy: WFL for tasks assessed/performed Overall Cognitive Status: Impaired/Different from baseline Area of Impairment: Attention;Following commands;Safety/judgement;Problem solving                   Current Attention Level: Selective   Following Commands: Follows one step commands with increased time Safety/Judgement: Decreased awareness of safety;Decreased awareness of  deficits   Problem Solving: Requires verbal cues;Requires  tactile cues;Slow processing General Comments: requires cues throughout mobility for safety      Exercises      General Comments General comments (skin integrity, edema, etc.): SBP 155 post-initial ambulation, SBP 144 post-second bout of ambulation with pt-reported dizziness      Pertinent Vitals/Pain Pain Assessment: Faces Faces Pain Scale: Hurts little more Pain Location: low back (since fall) Pain Descriptors / Indicators: Guarding;Moaning;Grimacing;Discomfort Pain Intervention(s): Limited activity within patient's tolerance;Monitored during session;Repositioned    Home Living                      Prior Function            PT Goals (current goals can now be found in the care plan section) Acute Rehab PT Goals Patient Stated Goal: home per son PT Goal Formulation: With patient/family Time For Goal Achievement: 09/11/20 Potential to Achieve Goals: Good Progress towards PT goals: Progressing toward goals    Frequency    Min 3X/week      PT Plan Current plan remains appropriate    Co-evaluation              AM-PAC PT "6 Clicks" Mobility   Outcome Measure  Help needed turning from your back to your side while in a flat bed without using bedrails?: A Little Help needed moving from lying on your back to sitting on the side of a flat bed without using bedrails?: A Little Help needed moving to and from a bed to a chair (including a wheelchair)?: A Little Help needed standing up from a chair using your arms (e.g., wheelchair or bedside chair)?: A Little Help needed to walk in hospital room?: A Lot Help needed climbing 3-5 steps with a railing? : Total 6 Click Score: 15    End of Session Equipment Utilized During Treatment: Gait belt Activity Tolerance: Patient tolerated treatment well Patient left: with call bell/phone within reach;with family/visitor present;in chair;with chair alarm set Nurse Communication: Mobility status PT Visit Diagnosis:  Unsteadiness on feet (R26.81);Other abnormalities of gait and mobility (R26.89);Muscle weakness (generalized) (M62.81);Pain     Time: 3662-9476 PT Time Calculation (min) (ACUTE ONLY): 24 min  Charges:  $Gait Training: 8-22 mins                     Stacie Glaze, PT Acute Rehabilitation Services Pager [phone]  Office [phone]    [place] 08/31/2020, 11:35 AM

## 2020-09-01 DIAGNOSIS — E1165 Type 2 diabetes mellitus with hyperglycemia: Secondary | ICD-10-CM | POA: Diagnosis not present

## 2020-09-01 LAB — GLUCOSE, CAPILLARY
Glucose-Capillary: 128 mg/dL — ABNORMAL HIGH (ref 70–99)
Glucose-Capillary: 187 mg/dL — ABNORMAL HIGH (ref 70–99)
Glucose-Capillary: 134 mg/dL — ABNORMAL HIGH (ref 70–99)
Glucose-Capillary: 133 mg/dL — ABNORMAL HIGH (ref 70–99)
Glucose-Capillary: 153 mg/dL — ABNORMAL HIGH (ref 70–99)

## 2020-09-01 LAB — URINALYSIS, ROUTINE W REFLEX MICROSCOPIC
Bilirubin Urine: NEGATIVE
Glucose, UA: >=500 mg/dL — AB
Ketones, ur: 20 mg/dL — AB
Nitrite: NEGATIVE
Protein, ur: 30 mg/dL — AB
Specific Gravity, Urine: 1.015 (ref 1.005–1.030)
pH: 5 (ref 5.0–8.0)

## 2020-09-01 LAB — SARS CORONAVIRUS 2 (TAT 6-24 HRS): SARS Coronavirus 2: NEGATIVE

## 2020-09-01 MED ORDER — ADULT MULTIVITAMIN W/MINERALS CH
1.0000 | ORAL_TABLET | Freq: Every day | ORAL | Status: DC
  Administered 2020-09-01 – 2020-09-10 (×6): 1 via ORAL
  Filled 2020-09-01 (×8): qty 1

## 2020-09-01 MED ORDER — ENSURE ENLIVE PO LIQD
237.0000 mL | Freq: Three times a day (TID) | ORAL | Status: DC
  Administered 2020-09-01 – 2020-09-07 (×9): 237 mL via ORAL

## 2020-09-01 NOTE — Progress Notes (Signed)
Skilled Nursing Rehab Facilities-   [email or web address]    Ratings out of 5 possible   Name          Address       Phone #                                 Quality Care-Staffing-Health-Overall [facility] [address] [place], [phone].  5 5 4 5  [facility] -[address], [place], [place].     4 2 4 4  [facility] [phone] [place], [place], [place].   1 3 1 1  [facility] & [address], [place], [facility] .   3 3 3 3  [facility] [address], [place], .      3 1 2 1  [facility] & [phone] [address] [place], .     3 2 2 2  [facility] [address] [place], [place].     4 2 2 2  [phone] [address] [place], [place].    4 1 2 1  [facility] -[address] [place], .  5 2 2 3  [facility] [phone], [place], [phone]. 5 1 2 2  [address], [place], [phone]. 5 2 2 3  [facility] [phone], [phone].   3 2 1  1

## 2020-09-01 NOTE — TOC Progression Note (Addendum)
Transition of Care [facility]) - Progression Note    Patient Details  Name: Kara Griffin MRN: [id number] Date of Birth: 1936/06/17  [facility]) CM/SW Contact  Mearl Latin, LCSW Phone Number: 09/01/2020, 9:19 AM  Clinical Narrative:    9am-CSW provided patient's son, [name], with an update on facility options, with two added facilities, [facility] [place] and [facility]. He reported that he will consult with his family but that he is requesting an ENT consult for patient's dizziness and will likely appeal patient's discharge if she is not feeling better when discharged. CSW to continue to follow.   11am-CSW provided additional bed offer of [facility] to patient's son. He is also looking into [facility].   2:30pm-Patient's son requested CSW look into [facility] (CSW found out not in network with [employer] [facility] [employer]) and [facility]. [facility] reviewing.   3:30pm-CSW alerted patient's son that [facility] and [facility] ($40/day private room) can accept patient.  3:47pm-CSW received call from [name] stating they would like to accept the bed at [facility] tomorrow and can pay the $40/day. CSW made him aware of visitation policy and he will bring patient's lenalidomide (REVLIMID) to the facility to administer, as they are unable to obtain it through their pharmacy.   CSW obtained insurance approval: #[id number], effective 12/29-12/31. CSW requested updated COVID test from MD as Larita Fife is requesting one within 24 hours of discharge.   Expected Discharge Plan: Skilled Nursing Facility Barriers to Discharge: SNF Pending bed offer,Insurance Authorization,Continued Medical Work up  Expected Discharge Plan and Services Expected Discharge Plan: Skilled Nursing Facility     Post Acute Care Choice: Skilled Nursing Facility Living arrangements for the past 2 months: Single Family Home                                       Social Determinants of Health (SDOH) Interventions     Readmission Risk Interventions No flowsheet data found.

## 2020-09-01 NOTE — Progress Notes (Signed)
PROGRESS NOTE    Kara Griffin  [name] DOB: 06/27/1936 DOA: 08/17/2020 PCP: Einar Pheasant, MD   Brief Narrative:  Patient is 84 year old female with past medical history of myelodysplastic syndrome-followed by Dr. Florene Glen at [facility] on Revlimid, pancytopenia, diabetes mellitus, hypertension, hyperlipidemia presents to emergency department with increasingly confusion since 2 days.Patient son states that since Thanksgiving almost a month and a half ago patient has become gradually weak and not herself.  Recently her blood pressure was found to be low and her primary care physician decreased her antihypertensives during the recent visit 2 days ago.  Patient also has chronic dizziness.  On August 19, 2020 about 8 days ago patient was taken to [facility] after patient had a fall at home.  Over the CT head C-spine and abdomen was done which showed features concerning for right-sided mucous plugging and MAI features and was given Z-Pak.  Following which patient had some couple of episodes of diarrhea.  Has not recorded any fever at home.  Has been having poor appetite with some nausea.  CT scan also at abdomen showed some gallbladder distention with no definite signs of any inflammation.  ED Course: In the ER patient is generally weak oriented to name and place.  Moving all extremities.  At the time of my exam was afebrile not hypoxic Covid test was negative CBC shows pancytopenia at baseline high-sensitivity and was 8 and blood glucose of 161 sodium 132.  LFTs were normal.  MRI brain has been ordered and admitted for acute encephalopathy with generalized weakness and poor appetite.  Assessment & Plan:   Acute right basal ganglia lacunar infarct: -Reviewed MRI brain. -Allow permissive hypertension. -Echo shows ejection fraction more than [id number], grade 1 diastolic dysfunction, -Carotid Doppler: 1 to 39% stenosis in the right and left carotid arteries. -A1c 6.0%, lipid panel:  WNL -Neurology recommended aspirin 81 mg and Plavix 75 mg daily for 3 weeks followed by aspirin alone.  Continue statin.  Appreciate neurology recommendations. -Consult PT/OT/SLP-recommend SNF-await bed availability/insurance authorization.  Acute metabolic encephalopathy: Improving -Likely in the setting of UTI.  UA looks infected.  Patient had fever of 100.5 in ED.  Patient started on broad-spectrum antibiotics. -Patient remained afebrile.  Blood culture so far negative.   -B12, folate, TSH, ammonia level, RPR: All came back within normal limits. -EEG negative for seizures -CT renal stone study, right upper quadrant ultrasound, chest x-ray: Negative for acute findings.  COVID-19 negative.   -Initial urine culture shows multiple species.  Repeat urine culture came back negative. -Received IV antibiotics for 3 days.  Dizziness/Nausea: -Symptoms improved.  Check her orthostatic vitals as her systolic blood pressure drops 10 points with PT/OT yesterday. -Meclizine & Zofran as needed -May need ENT eval outpatient if symptoms persists  Hemoptysis: -Chronic issue.  Chest x-ray on admission shows stable from prior.  No acute findings.  Discussed risk versus benefits of dual antiplatelet therapy-son agreed to continue with aspirin & Plavix to prevent further stroke.  Monitor H&H.  Hypertension: -Cont. Losartan  Hyperlipidemia: Continue statin  Type 2 diabetes mellitus: Well controlled.  A1c 6.0%.  Continue sliding scale insulin.  Myelodysplastic syndrome and pancytopenia: -Followed by oncologist Dr. Florene Glen at [facility]. -WBC/H&H/platelet: At baseline.  Continue to monitor.   -Continue Revlimid  Hypokalemia: Resolved  Hyponatremia: Sodium 132-132.  Patient is asymptomatic.  Continue to monitor.  Constipation/Bloating: Continue MiraLAX. - Xray abdomen on 12/25: negative for acute findings  Patient is doing well.  Await SNF bed placement  and insurance authorization.  DVT  prophylaxis: SCD Code Status: Full code Family Communication: Patient's son present at bedside.  Plan of care discussed with patient in length and she verbalized understanding and agreed with it. Disposition Plan: SNF-await bed availability and insurance authorization Consultants:   Neurology  Procedures:   MRI/MRA brain  EEG  Carotid Doppler  CT renal study  Right upper quadrant ultrasound  Antimicrobials:   Cefepime  Vancomycin  Rocephin  Status is: Inpatient   Dispo: The patient is from: Home              Anticipated d/c is to: SNF              Anticipated d/c date is: 1 day              Patient currently is medically stable for the discharge   Subjective: Patient seen and examined.  Son at bedside.    Tells me that patient has chronic hemoptysis which was previously discussed with her PCP however he was concerned that if she has any underlying etiology.  I reassured him about her chest x-ray findings on admission and discussed about risk and benefits of dual antiplatelet therapy-wishes to continue-dual antiplatelet therapy and follow-up with PCP and oncology .  He is concerned about patient's dizziness and nausea and poor appetite.  Reassured him.  Advised him for ENT follow-up outpatient and continue meclizine as needed for dizziness and Zofran for nausea and vomiting.  I told him that I consulted dietitian for her poor appetite and awaiting their recommendations.  He is concerned about that patient's systolic blood pressure drops 10 points with PT/OT yesterday.  Objective: Vitals:   09/01/20 0044 09/01/20 0442 09/01/20 0826 09/01/20 1211  BP: (!) 150/62 (!) 152/62 (!) 147/60 (!) 148/55  Pulse: 78 80 84 75  Resp: 17 17 17 18   Temp: 97.6 F (36.4 C) 97.7 F (36.5 C) 98.3 F (36.8 C) 98 F (36.7 C)  TempSrc: Oral Oral Oral Oral  SpO2: 98% 98% 96% 97%    Intake/Output Summary (Last 24 hours) at 09/01/2020 1521 Last data filed at 09/01/2020 0500 Gross  per 24 hour  Intake --  Output 250 ml  Net -250 ml   There were no vitals filed for this visit.  Examination:  General exam: Appears calm and comfortable, on room air, elderly, communicating well and following commands. Respiratory system: Clear to auscultation. Respiratory effort normal. Cardiovascular system: S1 & S2 heard, RRR. No JVD, murmurs, rubs, gallops or clicks. No pedal edema. Gastrointestinal system: Abdomen is nondistended, soft and nontender. No organomegaly or masses felt. Normal bowel sounds heard. Central nervous system: Alert and oriented.  Following commands.  Moves all extremities equally. Skin: No rashes, lesions or ulcers    Data Reviewed: I have personally reviewed following labs and imaging studies  CBC: Recent Labs  Lab 08/26/2020 1814 08/29/20 0324 08/30/20 0225 08/31/20 0246  WBC 3.4* 2.4* 2.5* 2.3*  HGB 11.2* 9.3* 9.8* 9.1*  HCT 34.5* 26.5* 29.5* 27.7*  MCV 89.6 88.0 87.5 89.4  PLT 141* 115* 125* 123XX123*   Basic Metabolic Panel: Recent Labs  Lab 08/15/2020 1814 08/29/20 0324 08/30/20 0225 08/31/20 0246  NA 132* 133* 132* 133*  K 3.5 3.1* 3.8 3.5  CL 96* 101 101 99  CO2 24 21* 22 24  GLUCOSE 161* 141* 143* 165*  BUN 13 9 7* 9  CREATININE 0.95 0.76 0.64 0.64  CALCIUM 9.7 8.2* 8.5* 8.7*  MG  --  1.6*  --   --    GFR: Estimated Creatinine Clearance: 49.2 mL/min (by C-G formula based on SCr of 0.64 mg/dL). Liver Function Tests: Recent Labs  Lab 08/13/2020 1814  AST 11*  ALT 15  ALKPHOS 58  BILITOT 0.8  PROT 6.8  ALBUMIN 3.8   No results for input(s): LIPASE, AMYLASE in the last 168 hours. Recent Labs  Lab 08/30/2020 2356  AMMONIA 11   Coagulation Profile: No results for input(s): INR, PROTIME in the last 168 hours. Cardiac Enzymes: No results for input(s): CKTOTAL, CKMB, CKMBINDEX, TROPONINI in the last 168 hours. BNP (last 3 results) No results for input(s): PROBNP in the last 8760 hours. HbA1C: No results for input(s): HGBA1C  in the last 72 hours. CBG: Recent Labs  Lab 08/31/20 1557 08/31/20 2206 09/01/20 0615 09/01/20 0922 09/01/20 1207  GLUCAP 167* 157* 128* 187* 134*   Lipid Profile: No results for input(s): CHOL, HDL, LDLCALC, TRIG, CHOLHDL, LDLDIRECT in the last 72 hours. Thyroid Function Tests: No results for input(s): TSH, T4TOTAL, FREET4, T3FREE, THYROIDAB in the last 72 hours. Anemia Panel: No results for input(s): VITAMINB12, FOLATE, FERRITIN, TIBC, IRON, RETICCTPCT in the last 72 hours. Sepsis Labs: No results for input(s): PROCALCITON, LATICACIDVEN in the last 168 hours.  Recent Results (from the past 240 hour(s))  Culture, Urine     Status: Abnormal   Collection Time: 08/14/2020  6:56 PM   Specimen: Urine, Random  Result Value Ref Range Status   Specimen Description URINE, RANDOM  Final   Special Requests   Final    NONE Performed at [facility], [address], [place], [place] [address]    Culture MULTIPLE SPECIES PRESENT, SUGGEST RECOLLECTION (A)  Final   Report Status 08/29/2020 FINAL  Final  Resp Panel by RT-PCR (Flu A&B, Covid) Nasopharyngeal Swab     Status: None   Collection Time: 09/04/2020 10:15 PM   Specimen: Nasopharyngeal Swab; Nasopharyngeal(NP) swabs in vial transport medium  Result Value Ref Range Status   SARS Coronavirus 2 by RT PCR NEGATIVE NEGATIVE Final    Comment: (NOTE) SARS-CoV-2 target nucleic acids are NOT DETECTED.  The SARS-CoV-2 RNA is generally detectable in upper respiratory specimens during the acute phase of infection. The lowest concentration of SARS-CoV-2 viral copies this assay can detect is 138 copies/mL. A negative result does not preclude SARS-Cov-2 infection and should not be used as the sole basis for treatment or other patient management decisions. A negative result may occur with  improper specimen collection/handling, submission of specimen other than nasopharyngeal swab, presence of viral mutation(s) within the areas targeted by  this assay, and inadequate number of viral copies(<138 copies/mL). A negative result must be combined with clinical observations, patient history, and epidemiological information. The expected result is Negative.  Fact Sheet for Patients:  EntrepreneurPulse.com.au  Fact Sheet for Healthcare Providers:  IncredibleEmployment.be  This test is no t yet approved or cleared by the [place] FDA and  has been authorized for detection and/or diagnosis of SARS-CoV-2 by FDA under an Emergency Use Authorization (EUA). This EUA will remain  in effect (meaning this test can be used) for the duration of the COVID-19 declaration under Section 564(b)(1) of the Act, 21 U.S.C.section 360bbb-3(b)(1), unless the authorization is terminated  or revoked sooner.       Influenza A by PCR NEGATIVE NEGATIVE Final   Influenza B by PCR NEGATIVE NEGATIVE Final    Comment: (NOTE) The Xpert Xpress SARS-CoV-2/FLU/RSV plus assay is intended  as an aid in the diagnosis of influenza from Nasopharyngeal swab specimens and should not be used as a sole basis for treatment. Nasal washings and aspirates are unacceptable for Xpert Xpress SARS-CoV-2/FLU/RSV testing.  Fact Sheet for Patients: EntrepreneurPulse.com.au  Fact Sheet for Healthcare Providers: IncredibleEmployment.be  This test is not yet approved or cleared by the [place] FDA and has been authorized for detection and/or diagnosis of SARS-CoV-2 by FDA under an Emergency Use Authorization (EUA). This EUA will remain in effect (meaning this test can be used) for the duration of the COVID-19 declaration under Section 564(b)(1) of the Act, 21 U.S.C. section 360bbb-3(b)(1), unless the authorization is terminated or revoked.  Performed at [facility], [place] [address], [place], [place] [address]   Culture, blood (routine x 2)     Status: None (Preliminary result)    Collection Time: 08/28/20  4:26 AM   Specimen: BLOOD RIGHT HAND  Result Value Ref Range Status   Specimen Description BLOOD RIGHT HAND  Final   Special Requests   Final    BOTTLES DRAWN AEROBIC AND ANAEROBIC Blood Culture adequate volume   Culture   Final    NO GROWTH 4 DAYS Performed at [facility], [place] [address] [place], [place] [address]    Report Status PENDING  Incomplete  Culture, blood (routine x 2)     Status: None (Preliminary result)   Collection Time: 08/28/20  4:35 AM   Specimen: BLOOD LEFT WRIST  Result Value Ref Range Status   Specimen Description BLOOD LEFT WRIST  Final   Special Requests   Final    BOTTLES DRAWN AEROBIC AND ANAEROBIC Blood Culture adequate volume   Culture   Final    NO GROWTH 4 DAYS Performed at [facility], [place] [address], [place], [place] [address]    Report Status PENDING  Incomplete  Culture, Urine     Status: None   Collection Time: 08/29/20  7:35 AM   Specimen: Urine, Random  Result Value Ref Range Status   Specimen Description URINE, RANDOM  Final   Special Requests NONE  Final   Culture   Final    NO GROWTH Performed at [facility], [place] [address], [place], [place] [address]    Report Status 08/30/2020 FINAL  Final  Resp Panel by RT-PCR (Flu A&B, Covid) Nasopharyngeal Swab     Status: None   Collection Time: 08/31/20 12:10 PM   Specimen: Nasopharyngeal Swab; Nasopharyngeal(NP) swabs in vial transport medium  Result Value Ref Range Status   SARS Coronavirus 2 by RT PCR NEGATIVE NEGATIVE Final    Comment: (NOTE) SARS-CoV-2 target nucleic acids are NOT DETECTED.  The SARS-CoV-2 RNA is generally detectable in upper respiratory specimens during the acute phase of infection. The lowest concentration of SARS-CoV-2 viral copies this assay can detect is 138 copies/mL. A negative result does not preclude SARS-Cov-2 infection and should not be used as the sole basis for treatment or other patient management  decisions. A negative result may occur with  improper specimen collection/handling, submission of specimen other than nasopharyngeal swab, presence of viral mutation(s) within the areas targeted by this assay, and inadequate number of viral copies(<138 copies/mL). A negative result must be combined with clinical observations, patient history, and epidemiological information. The expected result is Negative.  Fact Sheet for Patients:  EntrepreneurPulse.com.au  Fact Sheet for Healthcare Providers:  IncredibleEmployment.be  This test is no t yet approved or cleared by the [place]  States FDA and  has been authorized for detection and/or diagnosis of SARS-CoV-2 by FDA under an Emergency Use Authorization (EUA). This EUA will remain  in effect (meaning this test can be used) for the duration of the COVID-19 declaration under Section 564(b)(1) of the Act, 21 U.S.C.section 360bbb-3(b)(1), unless the authorization is terminated  or revoked sooner.       Influenza A by PCR NEGATIVE NEGATIVE Final   Influenza B by PCR NEGATIVE NEGATIVE Final    Comment: (NOTE) The Xpert Xpress SARS-CoV-2/FLU/RSV plus assay is intended as an aid in the diagnosis of influenza from Nasopharyngeal swab specimens and should not be used as a sole basis for treatment. Nasal washings and aspirates are unacceptable for Xpert Xpress SARS-CoV-2/FLU/RSV testing.  Fact Sheet for Patients: BloggerCourse.com  Fact Sheet for Healthcare Providers: SeriousBroker.it  This test is not yet approved or cleared by the [place] FDA and has been authorized for detection and/or diagnosis of SARS-CoV-2 by FDA under an Emergency Use Authorization (EUA). This EUA will remain in effect (meaning this test can be used) for the duration of the COVID-19 declaration under Section 564(b)(1) of the Act, 21 U.S.C. section 360bbb-3(b)(1), unless the  authorization is terminated or revoked.  Performed at [facility], [address], [place], [place] [address]       Radiology Studies: No results found.  Scheduled Meds: . aspirin  81 mg Oral Daily  . clopidogrel  75 mg Oral Daily  . insulin aspart  0-9 Units Subcutaneous TID WC  . lenalidomide  10 mg Oral Daily  . losartan  25 mg Oral QHS  . polyethylene glycol  17 g Oral Daily  . rosuvastatin  10 mg Oral q1800  . senna-docusate  1 tablet Oral BID  . vitamin B-12  1,000 mcg Oral Daily   Continuous Infusions:    LOS: 4 days   Time spent: 35 minutes  Raveen Wieseler Estill Cotta, MD [facility]  If 7PM-7AM, please contact night-coverage [email or web address] 09/01/2020, 3:21 PM

## 2020-09-01 NOTE — Progress Notes (Signed)
Initial Nutrition Assessment  DOCUMENTATION CODES:   Not applicable  INTERVENTION:   Please obtain updated measured weight.   Ensure Enlive po TID, each supplement provides 350 kcal and 20 grams of protein  Magic cup TID with meals, each supplement provides 290 kcal and 9 grams of protein  MVI with minerals daily   NUTRITION DIAGNOSIS:   Inadequate oral intake related to poor appetite as evidenced by per patient/family report.    GOAL:   Patient will meet greater than or equal to 90% of their needs    MONITOR:   PO intake,Supplement acceptance,Weight trends,Labs,I & O's  REASON FOR ASSESSMENT:   Consult Assessment of nutrition requirement/status,Poor PO  ASSESSMENT:   Pt admitted with acute R basal ganglia lacunar infarct. PMH includes myelodysplastic syndrome, pancytopenia, DM, HTN, HLD.  Pt unavailable at time of RD visit. Per H&P, pt's son reports that pt has become gradually weaker/not herself since Thanksgiving. Pt has been experiencing poor appetite with some nausea.   No PO intake documented.  Reviewed weight history. Pt weighed 79.8 kg on 02/12/20 and now weighs 72.1 kg, indicating 9.65% wt loss x 6 months. This is close to being clinically significant (10% or more in 6 months). Note that pt hasn't had updated weight since 12/21. Recommend obtaining updated measured weight to fully assess weight history.   UOP: documented x24 hours  Labs: Na 133 (L), CBGs 127-187 Medications: ss novolog TID w/ meals, miralax, senokot-s, vitamin B12  NUTRITION - FOCUSED PHYSICAL EXAM:  Unable to perform at this time. Will attempt at follow-up.  Diet Order:   Diet Order            Diet regular Room service appropriate? Yes; Fluid consistency: Thin  Diet effective now                 EDUCATION NEEDS:   No education needs have been identified at this time  Skin:  Skin Assessment: Reviewed RN Assessment  Last BM:  12/26 type 4  Height:   Ht Readings  from Last 1 Encounters:  08/25/20 5\' 1"  (1.549 m)    Weight:   Wt Readings from Last 1 Encounters:  08/25/20 72.1 kg   BMI:  There is no height or weight on file to calculate BMI.  Estimated Nutritional Needs:   Kcal:  1600-1800  Protein:  80-95 grams  Fluid:  >1.6L/d    08/13/2020, MS, RD, LDN RD pager number and weekend/on-call pager number located in Amion.

## 2020-09-02 ENCOUNTER — Inpatient Hospital Stay (HOSPITAL_COMMUNITY): Payer: Medicare PPO

## 2020-09-02 DIAGNOSIS — I639 Cerebral infarction, unspecified: Secondary | ICD-10-CM | POA: Diagnosis not present

## 2020-09-02 DIAGNOSIS — G9340 Encephalopathy, unspecified: Secondary | ICD-10-CM | POA: Diagnosis not present

## 2020-09-02 DIAGNOSIS — E1165 Type 2 diabetes mellitus with hyperglycemia: Secondary | ICD-10-CM | POA: Diagnosis not present

## 2020-09-02 DIAGNOSIS — I6330 Cerebral infarction due to thrombosis of unspecified cerebral artery: Secondary | ICD-10-CM

## 2020-09-02 LAB — GLUCOSE, CAPILLARY
Glucose-Capillary: 158 mg/dL — ABNORMAL HIGH (ref 70–99)
Glucose-Capillary: 256 mg/dL — ABNORMAL HIGH (ref 70–99)
Glucose-Capillary: 161 mg/dL — ABNORMAL HIGH (ref 70–99)
Glucose-Capillary: 159 mg/dL — ABNORMAL HIGH (ref 70–99)

## 2020-09-02 LAB — BASIC METABOLIC PANEL
Anion gap: 11 (ref 5–15)
BUN: 8 mg/dL (ref 8–23)
CO2: 25 mmol/L (ref 22–32)
Calcium: 8.7 mg/dL — ABNORMAL LOW (ref 8.9–10.3)
Chloride: 96 mmol/L — ABNORMAL LOW (ref 98–111)
Creatinine, Ser: 0.61 mg/dL (ref 0.44–1.00)
GFR, Estimated: >60 mL/min (ref >60)
Glucose, Bld: 152 mg/dL — ABNORMAL HIGH (ref 70–99)
Potassium: 2.9 mmol/L — ABNORMAL LOW (ref 3.5–5.1)
Sodium: 132 mmol/L — ABNORMAL LOW (ref 135–145)

## 2020-09-02 LAB — CULTURE, BLOOD (ROUTINE X 2)
Culture: NO GROWTH
Culture: NO GROWTH
Special Requests: ADEQUATE
Special Requests: ADEQUATE

## 2020-09-02 LAB — CBC
HCT: 26.8 % — ABNORMAL LOW (ref 36.0–46.0)
Hemoglobin: 9.4 g/dL — ABNORMAL LOW (ref 12.0–15.0)
MCH: 30.2 pg (ref 26.0–34.0)
MCHC: 35.1 g/dL (ref 30.0–36.0)
MCV: 86.2 fL (ref 80.0–100.0)
Platelets: 116 10*3/uL — ABNORMAL LOW (ref 150–400)
RBC: 3.11 MIL/uL — ABNORMAL LOW (ref 3.87–5.11)
RDW: 15.5 % (ref 11.5–15.5)
WBC: 1.9 10*3/uL — ABNORMAL LOW (ref 4.0–10.5)
nRBC: 0 % (ref 0.0–0.2)

## 2020-09-02 MED ORDER — ENSURE ENLIVE PO LIQD: 237.0000 mL | Freq: Three times a day (TID) | ORAL | 0 refills | Status: AC

## 2020-09-02 MED ORDER — ENOXAPARIN SODIUM 40 MG/0.4ML ~~LOC~~ SOLN
40.0000 mg | SUBCUTANEOUS | Status: DC
  Administered 2020-09-02 – 2020-09-11 (×9): 40 mg via SUBCUTANEOUS
  Filled 2020-09-02 (×9): qty 0.4

## 2020-09-02 MED ORDER — MECLIZINE HCL 12.5 MG PO TABS: 12.5000 mg | ORAL_TABLET | Freq: Three times a day (TID) | ORAL | 0 refills | Status: AC | PRN

## 2020-09-02 MED ORDER — ONDANSETRON HCL 4 MG/2ML IJ SOLN: 4.0000 mg | Freq: Four times a day (QID) | INTRAMUSCULAR | 0 refills | Status: DC | PRN

## 2020-09-02 MED ORDER — POTASSIUM CHLORIDE CRYS ER 20 MEQ PO TBCR
40.0000 meq | EXTENDED_RELEASE_TABLET | Freq: Two times a day (BID) | ORAL | Status: AC
  Administered 2020-09-02 (×2): 40 meq via ORAL
  Filled 2020-09-02 (×2): qty 2

## 2020-09-02 MED ORDER — POTASSIUM CHLORIDE CRYS ER 20 MEQ PO TBCR: 20.0000 meq | EXTENDED_RELEASE_TABLET | Freq: Every day | ORAL | 0 refills | Status: AC

## 2020-09-02 MED ORDER — LORAZEPAM 2 MG/ML IJ SOLN
0.5000 mg | Freq: Once | INTRAMUSCULAR | Status: DC
  Filled 2020-09-02: qty 1

## 2020-09-02 MED ORDER — IOHEXOL 350 MG/ML SOLN
75.0000 mL | Freq: Once | INTRAVENOUS | Status: AC | PRN
  Administered 2020-09-02: 75 mL via INTRAVENOUS

## 2020-09-02 MED ORDER — SACCHAROMYCES BOULARDII 250 MG PO CAPS
250.0000 mg | ORAL_CAPSULE | Freq: Two times a day (BID) | ORAL | Status: DC
  Administered 2020-09-02 – 2020-09-04 (×5): 250 mg via ORAL
  Filled 2020-09-02 (×7): qty 1

## 2020-09-02 MED ORDER — MECLIZINE HCL 12.5 MG PO TABS: 12.5000 mg | ORAL_TABLET | Freq: Three times a day (TID) | ORAL | 0 refills | Status: DC | PRN

## 2020-09-02 MED ORDER — POLYETHYLENE GLYCOL 3350 17 G PO PACK: 17.0000 g | PACK | Freq: Every day | ORAL | 0 refills | Status: AC | PRN

## 2020-09-02 MED ORDER — CLOPIDOGREL BISULFATE 75 MG PO TABS: 75.0000 mg | ORAL_TABLET | Freq: Every day | ORAL | 0 refills | Status: DC

## 2020-09-02 MED ORDER — POLYETHYLENE GLYCOL 3350 17 G PO PACK: 17.0000 g | PACK | Freq: Every day | ORAL | 0 refills | Status: DC

## 2020-09-02 MED ORDER — CLOPIDOGREL BISULFATE 75 MG PO TABS: 75.0000 mg | ORAL_TABLET | Freq: Every day | ORAL | 0 refills | Status: AC

## 2020-09-02 MED ORDER — SODIUM CHLORIDE 0.9 % IV SOLN
1.0000 g | INTRAVENOUS | Status: DC
  Administered 2020-09-02 – 2020-09-04 (×3): 1 g via INTRAVENOUS
  Filled 2020-09-02 (×3): qty 10

## 2020-09-02 MED ORDER — SODIUM CHLORIDE 0.9 % IV SOLN: INTRAVENOUS | Status: AC

## 2020-09-02 NOTE — Progress Notes (Signed)
STROKE TEAM PROGRESS NOTE   INTERVAL HISTORY Son at the bedside. Pt is lying in bed, lethargic but awake alert, not fully orientated but no significant focal deficit. She seems to have increased confusion, therefore MRI repeated and showed new 5 punctate b/l cerebellar infarcts. Neurology was called back for evaluation.   Vitals:   09/01/20 1950 09/02/20 0004 09/02/20 0344 09/02/20 1143  BP: (!) 144/61 (!) 150/58 (!) 146/54 (!) 151/59  Pulse: 71 69 74 84  Resp: 18 18 18 18   Temp: 98.3 F (36.8 C) 98.7 F (37.1 C) 98.5 F (36.9 C) 97.7 F (36.5 C)  TempSrc: Oral Oral Oral Oral  SpO2: 98% 95% 98%    CBC:  Recent Labs  Lab 08/31/20 0246 09/02/20 0217  WBC 2.3* 1.9*  HGB 9.1* 9.4*  HCT 27.7* 26.8*  MCV 89.4 86.2  PLT 117* 116*   Basic Metabolic Panel:  Recent Labs  Lab 08/29/20 0324 08/30/20 0225 08/31/20 0246 09/02/20 0217  NA 133*   < > 133* 132*  K 3.1*   < > 3.5 2.9*  CL 101   < > 99 96*  CO2 21*   < > 24 25  GLUCOSE 141*   < > 165* 152*  BUN 9   < > 9 8  CREATININE 0.76   < > 0.64 0.61  CALCIUM 8.2*   < > 8.7* 8.7*  MG 1.6*  --   --   --    < > = values in this interval not displayed.   Lipid Panel:  Recent Labs  Lab 08/28/20 0455  CHOL 99  TRIG 118  HDL 40*  CHOLHDL 2.5  VLDL 24  LDLCALC 35   HgbA1c:  Recent Labs  Lab 08/28/20 0455  HGBA1C 6.0*   Urine Drug Screen: No results for input(s): LABOPIA, COCAINSCRNUR, LABBENZ, AMPHETMU, THCU, LABBARB in the last 168 hours.  Alcohol Level No results for input(s): ETH in the last 168 hours.  IMAGING past 24 hours MR BRAIN WO CONTRAST  Result Date: 09/02/2020 CLINICAL DATA:  Myelodysplastic syndrome. Acute presentation with confusion. Weakness. EXAM: MRI HEAD WITHOUT CONTRAST TECHNIQUE: Multiplanar, multiecho pulse sequences of the brain and surrounding structures were obtained without intravenous contrast. COMPARISON:  08/28/2020 FINDINGS: Brain: Subcentimeter acute infarction within the putamen on the  right is unchanged since the prior exam. No new supratentorial finding. Newly seen today are 5-7 punctate foci of acute infarctions scattered within the inferior cerebellum on both sides. No large confluent infarction. Elsewhere, chronic small-vessel ischemic changes are seen within the deep and subcortical white matter of both cerebral hemispheres. No large vessel territory infarction. No mass, hemorrhage, hydrocephalus or extra-axial collection. Vascular: Major vessels at the base of the brain show flow. Skull and upper cervical spine: Negative Sinuses/Orbits: Clear/normal Other: None IMPRESSION: 1. Unchanged subcentimeter acute infarction within the putamen on the right. 2. Newly seen 5-7 punctate foci of acute infarctions scattered within the inferior cerebellum on both sides. No large confluent infarction. Being a different vascular territory, this suggests embolic disease from the heart or ascending aorta. 3. Chronic small-vessel ischemic changes elsewhere throughout the brain as outlined above. 4. Findings discussed with hospitalist physician at the time of interpretation. Electronically Signed   By: 08/30/2020 M.D.   On: 09/02/2020 14:31    PHYSICAL EXAM    Temp:  [97.7 F (36.5 C)-98.7 F (37.1 C)] 97.7 F (36.5 C) (12/29 1143) Pulse Rate:  [69-84] 84 (12/29 1143) Resp:  [18] 18 (12/29 1143)  BP: (144-151)/(54-61) 151/59 (12/29 1143) SpO2:  [95 %-98 %] 98 % (12/29 0344)  General - Well nourished, well developed, in no apparent distress, mildly lethargic.  Ophthalmologic - fundi not visualized due to noncooperation.  Cardiovascular - Regular rhythm and rate.  Neuro - awake alert orientated to people, but not to age or time. Stated she is in ER instead of hospital. She has paucity of speech only answer questions with words not sentences. However, she follows most simple commands. Able to name 2/4 and slow repeat 3-word sentence. Psychomotor slowing. Blinking to visual threat bilaterally,  able to track in both sides. Right mild nasolabial fold flattening. Tongue midline. BUE symmetrical strength no drift, FTN intact although slow. BLE withdraw to pain at least 3-/5 proximally 3/5 distally. Sensation subjectively symmetrical and gait not tested.   ASSESSMENT/PLAN [name] is a 84 y.o. female with history of DM2, HTN, Myelodysplastic syndrome, and HLD presenting with worsening dizziness and falls over past 1 month, waxing and waning confusion along with hallucinations.    Stroke:   R basal ganglia, as well as b/l cerebellar multifocal punctate infarcts secondary to unclear source.   MRI 12/24 Acute small R basal ganglia lacunar infarct (couple of days old). Small vessel disease.   MRA  No LVO. Moderate L A1 stenosis   Carotid Doppler - unremarkable  2D Echo EF 75%. No source of embolus   EEGs suggest mild diffuse onset encephalopathy.   MRI 12/29 Newly seen 5-7 punctate foci of acute infarctions scattered within the inferior cerebellum on both sides.  CTA head and neck pending  LE venous doppler pending  Recommend 30 day cardiac event monitoring as outpt to rule out afib  LDL 35  HgbA1c 6.0  Vitamin B12, TSH were normal and RPR was negative  VTE prophylaxis - SCDs   aspirin 81 mg daily prior to admission, now on aspirin 81 mg daily and Plavix 75 mg daily for 3 weeks followed by aspirin alone.  Therapy recommendations: SNF  Disposition:  pending   Encephalopathy  Fluctuation of mental status  Intermittent confusion  MRI brain infarcts can not explain the mental status change  EEG mild diffuse encephalopathy  Likely related to advanced age, ? sepsis, UTI and deconditioning  Treat for underlying conditions per primary team  Suspected sepsis  Blood Cultures neg  Urine culture neg  UA WBC 6-10  WBC's 2.4->2.3->1.9   afebrile  Cefepime and Vancomycin started 12/24 (afebrile) -> rocephin -> off  now  Hypertension  Stable . BP goal normotensive  Hyperlipidemia  Home meds:  Crestor 10, resumed in hospital  LDL 35, goal < 70  Continue statin at discharge  Diabetes type II controlled  HgbA1c 6.0, goal < 7.0  SSI  CBG monitoring  Close PCP follow up  Other Stroke Risk Factors  Advanced Age >/= 63   Other Active Problems, Findings and Recommendations  Myelodysplastic syndrome  Hypokalemia - 3.1 - supplemented - recheck tomorrow  Mild thrombocytopenia - platelets - 115  Hospital day # 5  Marvel Plan, MD PhD Stroke Neurology 09/02/2020 10:21 PM  To contact Stroke Continuity provider, please refer to [email or web address]. After hours, contact General Neurology

## 2020-09-02 NOTE — Progress Notes (Signed)
PROGRESS NOTE  Kara Griffin [name] DOB: [birth year] DOA: 08/07/2020 PCP: Einar Pheasant, MD  HPI/Recap of past 24 hours: Patient is 84 year old female with past medical history of myelodysplastic syndrome-followed by Dr. Florene Glen at [facility] on Revlimid, pancytopenia, diabetes mellitus, hypertension, hyperlipidemia presents to emergency department with increasingly confusion since 2 days.Patient son states that since Thanksgiving almost a month and a half ago patient has become gradually weak and not herself. Recently her blood pressure was found to be low and her primary care physician decreased her antihypertensivesduring the recent visit 2 days ago. Patient also has chronic dizziness. On August 19, 2020 about 8 days ago patient was taken to [facility] after patient had a fall at home. Over the CT head C-spine and abdomen was done which showed features concerning for right-sided mucous plugging and MAI features and was given Z-Pak. Following which patient had some couple of episodes of diarrhea. Has not recorded any fever at home. Has been having poor appetite with some nausea. CT scan also at abdomen showed some gallbladder distention with no definite signs of any inflammation.  ED Course:In the ER patient is generally weak oriented to name and place. Moving all extremities. At the time of my exam was afebrile not hypoxic Covid test was negative CBC shows pancytopenia at baseline high-sensitivity and was 8 and blood glucose of 161 sodium 132. LFTs were normal. MRI brain ordered and admitted for acute encephalopathy with generalized weakness and poor appetite.  MRI brain revealed acute small right basal ganglia lacunar infarct likely secondary to small vessel disease.  She completed a stroke work-up.  MRA brain no large vessel obstruction.  Moderate L A1 stenosis.  Carotid Doppler ultrasound unremarkable.  2D echo EF 75%, no source of embolus.  EEG suggestive of mild  diffuse encephalopathy, nonspecific etiology.  No seizures or epileptiform discharges were seen throughout the recording.  LDL 35.  Hemoglobin A1c 6.0.  Seen by neurology/stroke team with recommendation for aspirin 81 mg daily and Plavix 75 mg daily for 3 weeks followed by aspirin alone. PT OT recommended SNF.  09/02/20: Seen and examined at her bedside.  Her son is present.  She is alert and pleasantly confused, oriented x1, to place.  Her son is concerned about her mentation.  Repeated an MRI brain on 09/02/20 which shows new 5-7 punctate foci of acute infarctions scattered within the inferior cerebellum on both sides, suggestive of embolic disease from the heart or ascending aorta.    Assessment/Plan: Principal Problem:   Acute encephalopathy Active Problems:   Myelodysplastic syndrome (HCC)   Diabetes mellitus ([place])   Pancytopenia (HCC)   Cerebral thrombosis with cerebral infarction   Ischemic stroke (HCC)  Acute right basal ganglia lacunar infarct/new 5-7 punctate foci of acute infarctions scattered within the inferior cerebellum on both sides, suggestive of embolic disease from the heart or ascending aorta. Reviewed MRI brain done on 09/02/20 -Gradually return to normotensive blood pressure. MRI brain revealed acute small right basal ganglia lacunar infarct likely secondary to small vessel disease.  She completed a stroke work-up.  MRA brain no large vessel obstruction.  Moderate L A1 stenosis.  Carotid Doppler ultrasound unremarkable.  2D echo EF 75%, no source of embolus.  EEG suggestive of mild diffuse encephalopathy, nonspecific etiology.  No seizures or epileptiform discharges were seen throughout the recording.  LDL 35.  Hemoglobin A1c 6.0. Seen by neurology/stroke team with recommendation for aspirin 81 mg daily and Plavix 75 mg daily for 3 weeks followed  by aspirin alone. PT OT recommended SNF. Cardiology master contacted to arrange for 30 day cardiac event monitoring  outpatient to rule out occult A. Fib. Obtain bilateral lower extremity Doppler ultrasound to rule out DVT.  Acute metabolic encephalopathy:  Persistent UA positive for pyuria on 09/04/2020 however urine culture was inconclusive, grew multiple species on 08/24/2020, and then repeated urine culture showed no growth on 08/29/2020. Family is convinced that the patient has been urinary tract infection.  She has received 3 days of IV antibiotics.  Restarted Rocephin on 09/02/2020 for presumptive UTI.  Also started Florastor 250 mg twice daily. She presented with a fever of 100.5 in the ED.  Was started on broad-spectrum antibiotics. -Patient remained afebrile.  Blood culture so far negative.   -B12, folate, TSH, ammonia level, RPR: All came back within normal limits. -EEG negative for seizures -CT renal stone study, right upper quadrant ultrasound, chest x-ray: Negative for acute findings.  COVID-19 negative.   -Initial urine culture shows multiple species.  Repeat urine culture came back negative. -Received IV cefepime for 1 day and then 2 days of IV Rocephin, then it was DC'd.   Hypertension: Gradually normalized BP. Continue losartan 25 mg daily. Continue to monitor blood pressure  Hyperlipidemia:  Goal LDL less than 70, LDL 35 on 08/28/2020.  Continue statin  Type 2 diabetes mellitus: Well controlled.  A1c 6.0%.  Goal hemoglobin A1c less than 7.0. Continue sliding scale insulin while inpatient.  Myelodysplastic syndrome and pancytopenia: -Followed by oncologist Dr. Lowell Guitar at [facility]. -WBC/H&H/platelet: At baseline.  Continue to monitor.   -Continue Revlimid  Refractory hypokalemia:  Serum potassium 2.9, repleted orally.  Recheck in the morning.  Hyponatremia:  Sodium 132.    Started normal saline at 75 cc/h x 1 day. Repeat in the morning.  Constipation:  Ordered MiraLAX and Dulcolax suppository   DVT prophylaxis: SCD, subcu Lovenox daily. Code Status: Full  code Family Communication: Patient's son present at bedside and other son via phone.  Plan discussed at length. Disposition Plan: SNF tomorrow 09/03/2020.  Consultants:   Neurology  Procedures:   MRI/MRA brain  EEG  Carotid Doppler  CT renal study  Right upper quadrant ultrasound  Antimicrobials:   Cefepime  Vancomycin  Rocephin    Status is: Inpatient    Dispo: The patient is from: Home.              Anticipated d/c is to: SNF.              Anticipated d/c date is: 09/03/2020.               Patient currently not stable for DC due to acute metabolic encephalopathy, treatment for presumptive UTI, and IV hydration.        Objective: Vitals:   09/01/20 1950 09/02/20 0004 09/02/20 0344 09/02/20 1143  BP: (!) 144/61 (!) 150/58 (!) 146/54 (!) 151/59  Pulse: 71 69 74 84  Resp: 18 18 18 18   Temp: 98.3 F (36.8 C) 98.7 F (37.1 C) 98.5 F (36.9 C) 97.7 F (36.5 C)  TempSrc: Oral Oral Oral Oral  SpO2: 98% 95% 98%    No intake or output data in the 24 hours ending 09/02/20 1512 There were no vitals filed for this visit.  Exam:  . General: 84 y.o. year-old female well developed well nourished in no acute distress.  Alert and oriented x1.  Pleasantly confused. . Cardiovascular: Regular rate and rhythm with no rubs or gallops.  No  thyromegaly or JVD noted.   Marland Kitchen Respiratory: Clear to auscultation with no wheezes or rales. Good inspiratory effort. . Abdomen: Soft nontender nondistended with normal bowel sounds x4 quadrants. . Musculoskeletal: No lower extremity edema bilaterally.   Marland Kitchen Psychiatry: Mood is appropriate for condition and setting   Data Reviewed: CBC: Recent Labs  Lab 08/05/2020 1814 08/29/20 0324 08/30/20 0225 08/31/20 0246 09/02/20 0217  WBC 3.4* 2.4* 2.5* 2.3* 1.9*  HGB 11.2* 9.3* 9.8* 9.1* 9.4*  HCT 34.5* 26.5* 29.5* 27.7* 26.8*  MCV 89.6 88.0 87.5 89.4 86.2  PLT 141* 115* 125* 117* [id number]*   Basic Metabolic Panel: Recent Labs  Lab  08/26/2020 1814 08/29/20 0324 08/30/20 0225 08/31/20 0246 09/02/20 0217  NA 132* 133* 132* 133* 132*  K 3.5 3.1* 3.8 3.5 2.9*  CL 96* 101 101 99 96*  CO2 24 21* 22 24 25   GLUCOSE 161* 141* 143* 165* 152*  BUN 13 9 7* 9 8  CREATININE 0.95 0.76 0.64 0.64 0.61  CALCIUM 9.7 8.2* 8.5* 8.7* 8.7*  MG  --  1.6*  --   --   --    GFR: Estimated Creatinine Clearance: 49.2 mL/min (by C-G formula based on SCr of 0.61 mg/dL). Liver Function Tests: Recent Labs  Lab 08/10/2020 1814  AST 11*  ALT 15  ALKPHOS 58  BILITOT 0.8  PROT 6.8  ALBUMIN 3.8   No results for input(s): LIPASE, AMYLASE in the last 168 hours. Recent Labs  Lab 08/11/2020 2356  AMMONIA 11   Coagulation Profile: No results for input(s): INR, PROTIME in the last 168 hours. Cardiac Enzymes: No results for input(s): CKTOTAL, CKMB, CKMBINDEX, TROPONINI in the last 168 hours. BNP (last 3 results) No results for input(s): PROBNP in the last 8760 hours. HbA1C: No results for input(s): HGBA1C in the last 72 hours. CBG: Recent Labs  Lab 09/01/20 1207 09/01/20 1738 09/01/20 2107 09/02/20 0602 09/02/20 1145  GLUCAP 134* 133* 153* 158* 256*   Lipid Profile: No results for input(s): CHOL, HDL, LDLCALC, TRIG, CHOLHDL, LDLDIRECT in the last 72 hours. Thyroid Function Tests: No results for input(s): TSH, T4TOTAL, FREET4, T3FREE, THYROIDAB in the last 72 hours. Anemia Panel: No results for input(s): VITAMINB12, FOLATE, FERRITIN, TIBC, IRON, RETICCTPCT in the last 72 hours. Urine analysis:    Component Value Date/Time   COLORURINE YELLOW 09/01/2020 1951   APPEARANCEUR HAZY (A) 09/01/2020 1951   LABSPEC 1.015 09/01/2020 1951   PHURINE 5.0 09/01/2020 1951   GLUCOSEU >=500 (A) 09/01/2020 1951   HGBUR MODERATE (A) 09/01/2020 [place] NEGATIVE 09/01/2020 1951   KETONESUR 20 (A) 09/01/2020 1951   PROTEINUR 30 (A) 09/01/2020 1951   NITRITE NEGATIVE 09/01/2020 1951   LEUKOCYTESUR MODERATE (A) 09/01/2020 1951    Sepsis Labs: @LABRCNTIP (procalcitonin:4,lacticidven:4)  ) Recent Results (from the past 240 hour(s))  Culture, Urine     Status: Abnormal   Collection Time: 08/06/2020  6:56 PM   Specimen: Urine, Random  Result Value Ref Range Status   Specimen Description URINE, RANDOM  Final   Special Requests   Final    NONE Performed at [facility], [place] [address] [place], [place] [address]    Culture MULTIPLE SPECIES PRESENT, SUGGEST RECOLLECTION (A)  Final   Report Status 08/29/2020 FINAL  Final  Resp Panel by RT-PCR (Flu A&B, Covid) Nasopharyngeal Swab     Status: None   Collection Time: 09/04/2020 10:15 PM   Specimen: Nasopharyngeal Swab; Nasopharyngeal(NP) swabs in vial transport medium  Result  Value Ref Range Status   SARS Coronavirus 2 by RT PCR NEGATIVE NEGATIVE Final    Comment: (NOTE) SARS-CoV-2 target nucleic acids are NOT DETECTED.  The SARS-CoV-2 RNA is generally detectable in upper respiratory specimens during the acute phase of infection. The lowest concentration of SARS-CoV-2 viral copies this assay can detect is 138 copies/mL. A negative result does not preclude SARS-Cov-2 infection and should not be used as the sole basis for treatment or other patient management decisions. A negative result may occur with  improper specimen collection/handling, submission of specimen other than nasopharyngeal swab, presence of viral mutation(s) within the areas targeted by this assay, and inadequate number of viral copies(<138 copies/mL). A negative result must be combined with clinical observations, patient history, and epidemiological information. The expected result is Negative.  Fact Sheet for Patients:  EntrepreneurPulse.com.au  Fact Sheet for Healthcare Providers:  IncredibleEmployment.be  This test is no t yet approved or cleared by the [place] FDA and  has been authorized for detection and/or diagnosis of SARS-CoV-2 by FDA  under an Emergency Use Authorization (EUA). This EUA will remain  in effect (meaning this test can be used) for the duration of the COVID-19 declaration under Section 564(b)(1) of the Act, 21 U.S.C.section 360bbb-3(b)(1), unless the authorization is terminated  or revoked sooner.       Influenza A by PCR NEGATIVE NEGATIVE Final   Influenza B by PCR NEGATIVE NEGATIVE Final    Comment: (NOTE) The Xpert Xpress SARS-CoV-2/FLU/RSV plus assay is intended as an aid in the diagnosis of influenza from Nasopharyngeal swab specimens and should not be used as a sole basis for treatment. Nasal washings and aspirates are unacceptable for Xpert Xpress SARS-CoV-2/FLU/RSV testing.  Fact Sheet for Patients: EntrepreneurPulse.com.au  Fact Sheet for Healthcare Providers: IncredibleEmployment.be  This test is not yet approved or cleared by the [place] FDA and has been authorized for detection and/or diagnosis of SARS-CoV-2 by FDA under an Emergency Use Authorization (EUA). This EUA will remain in effect (meaning this test can be used) for the duration of the COVID-19 declaration under Section 564(b)(1) of the Act, 21 U.S.C. section 360bbb-3(b)(1), unless the authorization is terminated or revoked.  Performed at [facility], [place] [address] [place], [place] [address]   Culture, blood (routine x 2)     Status: None   Collection Time: 08/28/20  4:26 AM   Specimen: BLOOD RIGHT HAND  Result Value Ref Range Status   Specimen Description BLOOD RIGHT HAND  Final   Special Requests   Final    BOTTLES DRAWN AEROBIC AND ANAEROBIC Blood Culture adequate volume   Culture   Final    NO GROWTH 5 DAYS Performed at [facility], [place] [address], [place], [place] [address]    Report Status 09/02/2020 FINAL  Final  Culture, blood (routine x 2)     Status: None   Collection Time: 08/28/20  4:35 AM   Specimen: BLOOD LEFT WRIST  Result Value Ref Range Status    Specimen Description BLOOD LEFT WRIST  Final   Special Requests   Final    BOTTLES DRAWN AEROBIC AND ANAEROBIC Blood Culture adequate volume   Culture   Final    NO GROWTH 5 DAYS Performed at [facility], [place] [address] [place], [place] [address]    Report Status 09/02/2020 FINAL  Final  Culture, Urine     Status: None   Collection Time: 08/29/20  7:35 AM   Specimen:  Urine, Random  Result Value Ref Range Status   Specimen Description URINE, RANDOM  Final   Special Requests NONE  Final   Culture   Final    NO GROWTH Performed at [facility], [address], [place], [place] [address]    Report Status 08/30/2020 FINAL  Final  Resp Panel by RT-PCR (Flu A&B, Covid) Nasopharyngeal Swab     Status: None   Collection Time: 08/31/20 12:10 PM   Specimen: Nasopharyngeal Swab; Nasopharyngeal(NP) swabs in vial transport medium  Result Value Ref Range Status   SARS Coronavirus 2 by RT PCR NEGATIVE NEGATIVE Final    Comment: (NOTE) SARS-CoV-2 target nucleic acids are NOT DETECTED.  The SARS-CoV-2 RNA is generally detectable in upper respiratory specimens during the acute phase of infection. The lowest concentration of SARS-CoV-2 viral copies this assay can detect is 138 copies/mL. A negative result does not preclude SARS-Cov-2 infection and should not be used as the sole basis for treatment or other patient management decisions. A negative result may occur with  improper specimen collection/handling, submission of specimen other than nasopharyngeal swab, presence of viral mutation(s) within the areas targeted by this assay, and inadequate number of viral copies(<138 copies/mL). A negative result must be combined with clinical observations, patient history, and epidemiological information. The expected result is Negative.  Fact Sheet for Patients:  EntrepreneurPulse.com.au  Fact Sheet for Healthcare Providers:   IncredibleEmployment.be  This test is no t yet approved or cleared by the [place] FDA and  has been authorized for detection and/or diagnosis of SARS-CoV-2 by FDA under an Emergency Use Authorization (EUA). This EUA will remain  in effect (meaning this test can be used) for the duration of the COVID-19 declaration under Section 564(b)(1) of the Act, 21 U.S.C.section 360bbb-3(b)(1), unless the authorization is terminated  or revoked sooner.       Influenza A by PCR NEGATIVE NEGATIVE Final   Influenza B by PCR NEGATIVE NEGATIVE Final    Comment: (NOTE) The Xpert Xpress SARS-CoV-2/FLU/RSV plus assay is intended as an aid in the diagnosis of influenza from Nasopharyngeal swab specimens and should not be used as a sole basis for treatment. Nasal washings and aspirates are unacceptable for Xpert Xpress SARS-CoV-2/FLU/RSV testing.  Fact Sheet for Patients: EntrepreneurPulse.com.au  Fact Sheet for Healthcare Providers: IncredibleEmployment.be  This test is not yet approved or cleared by the [place] FDA and has been authorized for detection and/or diagnosis of SARS-CoV-2 by FDA under an Emergency Use Authorization (EUA). This EUA will remain in effect (meaning this test can be used) for the duration of the COVID-19 declaration under Section 564(b)(1) of the Act, 21 U.S.C. section 360bbb-3(b)(1), unless the authorization is terminated or revoked.  Performed at [facility], [place] [address], [place], [place] [address]   SARS CORONAVIRUS 2 (TAT 6-24 HRS) Nasopharyngeal Nasopharyngeal Swab     Status: None   Collection Time: 09/01/20  4:38 PM   Specimen: Nasopharyngeal Swab  Result Value Ref Range Status   SARS Coronavirus 2 NEGATIVE NEGATIVE Final    Comment: (NOTE) SARS-CoV-2 target nucleic acids are NOT DETECTED.  The SARS-CoV-2 RNA is generally detectable in upper and lower respiratory specimens during the  acute phase of infection. Negative results do not preclude SARS-CoV-2 infection, do not rule out co-infections with other pathogens, and should not be used as the sole basis for treatment or other patient management decisions. Negative results must be combined with clinical observations, patient history, and epidemiological information.  The expected result is Negative.  Fact Sheet for Patients: SugarRoll.be  Fact Sheet for Healthcare Providers: https://www.woods-mathews.com/  This test is not yet approved or cleared by the [place] FDA and  has been authorized for detection and/or diagnosis of SARS-CoV-2 by FDA under an Emergency Use Authorization (EUA). This EUA will remain  in effect (meaning this test can be used) for the duration of the COVID-19 declaration under Se ction 564(b)(1) of the Act, 21 U.S.C. section 360bbb-3(b)(1), unless the authorization is terminated or revoked sooner.  Performed at [facility], [place] [address] [place],  [address]       Studies: MR BRAIN WO CONTRAST  Result Date: 09/02/2020 CLINICAL DATA:  Myelodysplastic syndrome. Acute presentation with confusion. Weakness. EXAM: MRI HEAD WITHOUT CONTRAST TECHNIQUE: Multiplanar, multiecho pulse sequences of the brain and surrounding structures were obtained without intravenous contrast. COMPARISON:  08/28/2020 FINDINGS: Brain: Subcentimeter acute infarction within the putamen on the right is unchanged since the prior exam. No new supratentorial finding. Newly seen today are 5-7 punctate foci of acute infarctions scattered within the inferior cerebellum on both sides. No large confluent infarction. Elsewhere, chronic small-vessel ischemic changes are seen within the deep and subcortical white matter of both cerebral hemispheres. No large vessel territory infarction. No mass, hemorrhage, hydrocephalus or extra-axial collection. Vascular: Major vessels at the  base of the brain show flow. Skull and upper cervical spine: Negative Sinuses/Orbits: Clear/normal Other: None IMPRESSION: 1. Unchanged subcentimeter acute infarction within the putamen on the right. 2. Newly seen 5-7 punctate foci of acute infarctions scattered within the inferior cerebellum on both sides. No large confluent infarction. Being a different vascular territory, this suggests embolic disease from the heart or ascending aorta. 3. Chronic small-vessel ischemic changes elsewhere throughout the brain as outlined above. 4. Findings discussed with hospitalist physician at the time of interpretation. Electronically Signed   By: Nelson Chimes M.D.   On: 09/02/2020 14:31    Scheduled Meds: . aspirin  81 mg Oral Daily  . clopidogrel  75 mg Oral Daily  . feeding supplement  237 mL Oral TID BM  . insulin aspart  0-9 Units Subcutaneous TID WC  . lenalidomide  10 mg Oral Daily  . LORazepam  0.5 mg Intravenous Once  . losartan  25 mg Oral QHS  . multivitamin with minerals  1 tablet Oral Daily  . polyethylene glycol  17 g Oral Daily  . potassium chloride  40 mEq Oral BID  . rosuvastatin  10 mg Oral q1800  . saccharomyces boulardii  250 mg Oral BID  . senna-docusate  1 tablet Oral BID  . vitamin B-12  1,000 mcg Oral Daily    Continuous Infusions: . sodium chloride    . cefTRIAXone (ROCEPHIN)  IV       LOS: 5 days     Kayleen Memos, MD [facility] Pager [phone]  If 7PM-7AM, please contact night-coverage [email or web address] Password [email or web address] 09/02/2020, 3:12 PM

## 2020-09-02 NOTE — Progress Notes (Signed)
Physical Therapy Treatment Patient Details Name: Kara Griffin MRN: [id number] DOB: 1935/10/13 Today's Date: 09/02/2020    History of Present Illness 84 y.o. female with history of myelodysplastic syndrome being followed by Dr. Lowell Guitar at [facility] on [place], pancytopenia, diabetes mellitus, hypertension and hyperlipidemia was brought to the ED by patient's son after patient was found to be increasingly confused last 2 days. On August 19, 2020, patient was taken to [facility] after patient had a fall at home. CT and xray were negative during that ED visit and pt was d/c'd home. Pt underwent MRI 12/23 which revealed small R bagal ganglia lacunar infarct.    PT Comments    Pt appears to be more confused this date with her son reporting she did not recognize him this morning. Also, noted dark urine, RN notified of both concerns. Pt demonstrates decreased cognitive processing and coordination with all functional mobility, placing her at risk for falls and injuries. She required repeated single step multi-modal commands throughout all functional mobility to maintain her safety. Pt has difficulty managing the RW and even attempts to leave it rather than turn it with her body when pivoting to sit down. In addition, pt demonstrates poor motor control and sequencing with gait, requiring multi-modal cues to shift weight, extend knees in stance, and advance legs with swing (especially on the L) when ambulating x2 bouts of ~5 ft > ~8 ft with a RW. Pt required minAx2 for all functional mob this date. Will continue to follow acutely. Current recommendations remain appropriate.    Follow Up Recommendations  SNF;Supervision/Assistance - 24 hour     Equipment Recommendations  Wheelchair (measurements PT);Wheelchair cushion (measurements PT);Hospital bed (if pt d/c's home)    Recommendations for Other Services       Precautions / Restrictions Precautions Precautions: Fall Restrictions Weight Bearing  Restrictions: No    Mobility  Bed Mobility Overal bed mobility: Needs Assistance Bed Mobility: Supine to Sit     Supine to sit: HOB elevated;Min assist;+2 for physical assistance;+2 for safety/equipment     General bed mobility comments: Min +2 for trunk and LE management, pt able to scoot to EOB with increased time and effort.  Transfers Overall transfer level: Needs assistance Equipment used: Rolling walker (2 wheeled) Transfers: Sit to/from [employer] Sit to Stand: Min assist;+2 physical assistance;+2 safety/equipment Stand pivot transfers: Min assist;+2 physical assistance;+2 safety/equipment       General transfer comment: Repeated hand-over-hand cues to push up from current sitting surface, requiring assistance to hold them there while initiating sit to stand rather than reaching for RW. MinAx2 to come to stand when trying to pull up on RW but minAx1 when hands remained on arm rests or bed to push up to stand. MinAx2 to manage RW and direct pt to step laterally to pivot to commode or chair.  Ambulation/Gait Ambulation/Gait assistance: Min assist;+2 physical assistance;+2 safety/equipment Gait Distance (Feet): 8 Feet (x2 bouts of ~5 ft > ~8 ft) Assistive device: Rolling walker (2 wheeled) Gait Pattern/deviations: Step-through pattern;Decreased stride length;Trunk flexed;Decreased step length - left;Decreased stance time - right;Decreased stance time - left;Decreased weight shift to right;Decreased weight shift to left;Shuffle Gait velocity: decr Gait velocity interpretation: <1.31 ft/sec, indicative of household ambulator General Gait Details: Ambulates with trunk flexed and pushing RW distal to body, even releasing or turning without RW at times requiring repeated cues and assistance to manage RW safely throughout. Displays bilat knee flexion throughout and decreased weight shifting and step length bilat, esp  on L. Provided verbal and tactile cues and minAx2 to  balance pt and shift weight to each leg with knee block during stance. Cues provided to inc step length, with min success and carryover.   Stairs             Wheelchair Mobility    Modified Rankin (Stroke Patients Only) Modified Rankin (Stroke Patients Only) Pre-Morbid Rankin Score: No significant disability Modified Rankin: Moderately severe disability     Balance Overall balance assessment: Needs assistance Sitting-balance support: Feet supported;Bilateral upper extremity supported Sitting balance-Leahy Scale: Poor Sitting balance - Comments: Static sitting EOB with min guard assist.   Standing balance support: Bilateral upper extremity supported;During functional activity Standing balance-Leahy Scale: Poor Standing balance comment: reliant on external support                            Cognition Arousal/Alertness: Awake/alert Behavior During Therapy: WFL for tasks assessed/performed Overall Cognitive Status: Impaired/Different from baseline Area of Impairment: Attention;Following commands;Safety/judgement;Problem solving;Memory;Awareness;Orientation                 Orientation Level: Disoriented to;Place;Time;Situation Current Attention Level: Selective Memory: Decreased short-term memory Following Commands: Follows one step commands inconsistently;Follows one step commands with increased time Safety/Judgement: Decreased awareness of safety;Decreased awareness of deficits Awareness: Emergent Problem Solving: Slow processing;Decreased initiation;Difficulty sequencing;Requires verbal cues;Requires tactile cues General Comments: Pt unaware of safety concerns with her returning to sit randomly without ensuring there was a place to sit. Repeated verbal and tactile cues for proper hand placement with transfers, with very poor carryover. Required multi-modal single steps cues throughout to sequence and initiate tasks and to maintain safety.      Exercises       General Comments        Pertinent Vitals/Pain Pain Assessment: Faces Faces Pain Scale: Hurts little more Pain Location: low back (since fall); abdomen with attempts at bowel movement Pain Descriptors / Indicators: Guarding;Moaning;Grimacing;Discomfort Pain Intervention(s): Limited activity within patient's tolerance;Monitored during session;Repositioned    Home Living                      Prior Function            PT Goals (current goals can now be found in the care plan section) Acute Rehab PT Goals Patient Stated Goal: sons present and reported desire for pt to be in more "vertical" position more often PT Goal Formulation: With patient/family Time For Goal Achievement: 09/11/20 Potential to Achieve Goals: Good Progress towards PT goals: Progressing toward goals    Frequency    Min 3X/week      PT Plan Current plan remains appropriate    Co-evaluation              AM-PAC PT "6 Clicks" Mobility   Outcome Measure  Help needed turning from your back to your side while in a flat bed without using bedrails?: A Little Help needed moving from lying on your back to sitting on the side of a flat bed without using bedrails?: A Little Help needed moving to and from a bed to a chair (including a wheelchair)?: A Little Help needed standing up from a chair using your arms (e.g., wheelchair or bedside chair)?: A Little Help needed to walk in hospital room?: A Little Help needed climbing 3-5 steps with a railing? : Total 6 Click Score: 16    End of Session Equipment Utilized During Treatment: Gait  belt Activity Tolerance: Patient tolerated treatment well Patient left: in chair;with call bell/phone within reach;with chair alarm set;with family/visitor present Nurse Communication: Mobility status;Other (comment) (increased confusion and dark urine color) PT Visit Diagnosis: Unsteadiness on feet (R26.81);Other abnormalities of gait and mobility (R26.89);Muscle  weakness (generalized) (M62.81);Pain;Difficulty in walking, not elsewhere classified (R26.2) Pain - Right/Left:  (back and abdomen) Pain - part of body:  (back and abdomen)     Time: 6967-8938 PT Time Calculation (min) (ACUTE ONLY): 35 min  Charges:  $Gait Training: 8-22 mins $Therapeutic Activity: 8-22 mins                     Moishe Spice, PT, DPT Acute Rehabilitation Services  Pager: [phone] Office: [place] 09/02/2020, 1:26 PM

## 2020-09-02 NOTE — Plan of Care (Signed)
°  Problem: Education: Goal: Knowledge of disease or condition will improve Outcome: Adequate for Discharge Goal: Knowledge of secondary prevention will improve Outcome: Adequate for Discharge Goal: Knowledge of patient specific risk factors addressed and post discharge goals established will improve Outcome: Adequate for Discharge Goal: Individualized Educational Video(s) Outcome: Adequate for Discharge   Problem: Coping: Goal: Will verbalize positive feelings about self Outcome: Adequate for Discharge Goal: Will identify appropriate support needs Outcome: Adequate for Discharge   Problem: Health Behavior/Discharge Planning: Goal: Ability to manage health-related needs will improve Outcome: Adequate for Discharge   Problem: Self-Care: Goal: Ability to participate in self-care as condition permits will improve Outcome: Adequate for Discharge Goal: Verbalization of feelings and concerns over difficulty with self-care will improve Outcome: Adequate for Discharge Goal: Ability to communicate needs accurately will improve Outcome: Adequate for Discharge   Problem: Nutrition: Goal: Risk of aspiration will decrease Outcome: Adequate for Discharge   Problem: Intracerebral Hemorrhage Tissue Perfusion: Goal: Complications of Intracerebral Hemorrhage will be minimized Outcome: Adequate for Discharge   Problem: Spontaneous Subarachnoid Hemorrhage Tissue Perfusion: Goal: Complications of Spontaneous Subarachnoid Hemorrhage will be minimized Outcome: Adequate for Discharge

## 2020-09-02 NOTE — TOC Progression Note (Signed)
Transition of Care [facility]) - Progression Note    Patient Details  Name: Taleeyah Bora MRN: [id number] Date of Birth: 1936-05-03  [facility] CM/SW Contact  Mearl Latin, LCSW Phone Number: 09/02/2020, 2:37 PM  Clinical Narrative:    CSW made [facility] aware that patient is not discharging today.   Expected Discharge Plan: Skilled Nursing Facility Barriers to Discharge: SNF Pending bed offer,Insurance Authorization,Continued Medical Work up  Expected Discharge Plan and Services Expected Discharge Plan: Skilled Nursing Facility     Post Acute Care Choice: Skilled Nursing Facility Living arrangements for the past 2 months: Single Family Home Expected Discharge Date: 09/01/20                                     Social Determinants of Health (SDOH) Interventions    Readmission Risk Interventions No flowsheet data found.

## 2020-09-03 ENCOUNTER — Inpatient Hospital Stay (HOSPITAL_COMMUNITY): Payer: Medicare PPO

## 2020-09-03 DIAGNOSIS — I639 Cerebral infarction, unspecified: Secondary | ICD-10-CM | POA: Diagnosis not present

## 2020-09-03 DIAGNOSIS — I6330 Cerebral infarction due to thrombosis of unspecified cerebral artery: Secondary | ICD-10-CM | POA: Diagnosis not present

## 2020-09-03 DIAGNOSIS — G9340 Encephalopathy, unspecified: Secondary | ICD-10-CM | POA: Diagnosis not present

## 2020-09-03 DIAGNOSIS — E1165 Type 2 diabetes mellitus with hyperglycemia: Secondary | ICD-10-CM | POA: Diagnosis not present

## 2020-09-03 LAB — GLUCOSE, CAPILLARY
Glucose-Capillary: 166 mg/dL — ABNORMAL HIGH (ref 70–99)
Glucose-Capillary: 165 mg/dL — ABNORMAL HIGH (ref 70–99)
Glucose-Capillary: 207 mg/dL — ABNORMAL HIGH (ref 70–99)

## 2020-09-03 LAB — BASIC METABOLIC PANEL
Anion gap: 8 (ref 5–15)
BUN: 9 mg/dL (ref 8–23)
CO2: 26 mmol/L (ref 22–32)
Calcium: 8.4 mg/dL — ABNORMAL LOW (ref 8.9–10.3)
Chloride: 98 mmol/L (ref 98–111)
Creatinine, Ser: 0.57 mg/dL (ref 0.44–1.00)
GFR, Estimated: >60 mL/min (ref >60)
Glucose, Bld: 212 mg/dL — ABNORMAL HIGH (ref 70–99)
Potassium: 3.6 mmol/L (ref 3.5–5.1)
Sodium: 132 mmol/L — ABNORMAL LOW (ref 135–145)

## 2020-09-03 LAB — CBC WITH DIFFERENTIAL/PLATELET
Abs Immature Granulocytes: 0.09 10*3/uL — ABNORMAL HIGH (ref 0.00–0.07)
Basophils Absolute: 0 10*3/uL (ref 0.0–0.1)
Basophils Relative: 2 %
Eosinophils Absolute: 0 10*3/uL (ref 0.0–0.5)
Eosinophils Relative: 2 %
HCT: 27.2 % — ABNORMAL LOW (ref 36.0–46.0)
Hemoglobin: 9.7 g/dL — ABNORMAL LOW (ref 12.0–15.0)
Immature Granulocytes: 5 %
Lymphocytes Relative: 6 %
Lymphs Abs: 0.1 10*3/uL — ABNORMAL LOW (ref 0.7–4.0)
MCH: 30.7 pg (ref 26.0–34.0)
MCHC: 35.7 g/dL (ref 30.0–36.0)
MCV: 86.1 fL (ref 80.0–100.0)
Monocytes Absolute: 0.4 10*3/uL (ref 0.1–1.0)
Monocytes Relative: 21 %
Neutro Abs: 1.1 10*3/uL — ABNORMAL LOW (ref 1.7–7.7)
Neutrophils Relative %: 64 %
Platelets: 113 10*3/uL — ABNORMAL LOW (ref 150–400)
RBC: 3.16 MIL/uL — ABNORMAL LOW (ref 3.87–5.11)
RDW: 15.4 % (ref 11.5–15.5)
WBC: 1.7 10*3/uL — ABNORMAL LOW (ref 4.0–10.5)
nRBC: 0 % (ref 0.0–0.2)

## 2020-09-03 LAB — LACTIC ACID, PLASMA: Lactic Acid, Venous: 1 mmol/L (ref 0.5–1.9)

## 2020-09-03 LAB — PROCALCITONIN: Procalcitonin: 0.16 ng/mL

## 2020-09-03 LAB — VITAMIN B1: Vitamin B1 (Thiamine): 89.7 nmol/L (ref 66.5–200.0)

## 2020-09-03 LAB — MAGNESIUM: Magnesium: 1.8 mg/dL (ref 1.7–2.4)

## 2020-09-03 MED ORDER — LOSARTAN POTASSIUM 25 MG PO TABS
25.0000 mg | ORAL_TABLET | Freq: Two times a day (BID) | ORAL | Status: DC
  Administered 2020-09-03: 21:00:00 25 mg via ORAL
  Filled 2020-09-03: qty 1

## 2020-09-03 MED ORDER — HYDROCODONE-ACETAMINOPHEN 5-325 MG PO TABS
1.0000 | ORAL_TABLET | ORAL | Status: DC | PRN
  Filled 2020-09-03: qty 2

## 2020-09-03 MED ORDER — MAGNESIUM SULFATE 2 GM/50ML IV SOLN
2.0000 g | Freq: Once | INTRAVENOUS | Status: AC
  Administered 2020-09-03: 13:00:00 2 g via INTRAVENOUS
  Filled 2020-09-03: qty 50

## 2020-09-03 MED ORDER — POTASSIUM CHLORIDE CRYS ER 20 MEQ PO TBCR
40.0000 meq | EXTENDED_RELEASE_TABLET | Freq: Once | ORAL | Status: AC
  Administered 2020-09-03: 12:00:00 40 meq via ORAL
  Filled 2020-09-03: qty 2

## 2020-09-03 NOTE — CV Procedure (Signed)
Bilateral venous duplex completed.  Results can be found under chart review under CV PROC. 09/03/2020 11:33 AM Paxton Kanaan RVT, RDMS

## 2020-09-03 NOTE — Progress Notes (Signed)
PROGRESS NOTE  Kara Griffin [id number] DOB: 1935/09/15 DOA: 08/26/2020 PCP: Einar Pheasant, MD  HPI/Recap of past 24 hours: Patient is 84 year old female with past medical history of myelodysplastic syndrome-followed by Dr. Florene Glen at [facility] on Revlimid, pancytopenia, diabetes mellitus, hypertension, hyperlipidemia presents to emergency department with increasingly confusion since 2 days.Patient son states that since Thanksgiving almost a month and a half ago patient has become gradually weak and not herself. Recently her blood pressure was found to be low and her primary care physician decreased her antihypertensivesduring the recent visit 2 days ago. Patient also has chronic dizziness. On August 19, 2020 about 8 days ago patient was taken to [facility] after patient had a fall at home. Over the CT head C-spine and abdomen was done which showed features concerning for right-sided mucous plugging and MAI features and was given Z-Pak. Following which patient had some couple of episodes of diarrhea. Has not recorded any fever at home. Has been having poor appetite with some nausea. CT scan also at abdomen showed some gallbladder distention with no definite signs of any inflammation.  ED Course:In the ER patient is generally weak oriented to name and place. Moving all extremities. At the time of my exam was afebrile not hypoxic Covid test was negative CBC shows pancytopenia at baseline high-sensitivity and was 8 and blood glucose of 161 sodium 132. LFTs were normal. MRI brain ordered and admitted for acute encephalopathy with generalized weakness and poor appetite.  MRI brain revealed acute small right basal ganglia lacunar infarct likely secondary to small vessel disease.  She completed a stroke work-up.  MRA brain no large vessel obstruction.  Moderate L A1 stenosis.  Carotid Doppler ultrasound unremarkable.  2D echo EF 75%, no source of embolus.  EEG suggestive of mild  diffuse encephalopathy, nonspecific etiology.  No seizures or epileptiform discharges were seen throughout the recording.  LDL 35.  Hemoglobin A1c 6.0.  Seen by neurology/stroke team with recommendation for aspirin 81 mg daily and Plavix 75 mg daily for 3 weeks followed by aspirin alone. PT OT recommended SNF.  Due to concern of her son about her mentation.  Repeat MRI brain was done on 09/02/2020 which showed new 5-7 punctate foci of acute infarctions scattered within the inferior cerebellum on both sides, suggestive of embolic disease from the heart or ascending aorta.  Cardiology was contacted for arrangement of 30-day cardiac event monitoring outpatient.   09/03/20: She was seen and examined with her son at her bedside.  She is somnolent however she is easily arousable to voices.  She was able to answer 2 out of 3 questions appropriately.  Per her son she appears to be improving.  She is currently on IV antibiotics and IV fluid.  She was seen by neurology, we appreciate recommendations.  She had a CT a head and neck completed which was unremarkable.  Bilateral lower extremity venous Doppler ultrasound was negative for DVT.  Neurology signed off.  She will follow-up outpatient at [facility] in about 4 weeks.   Assessment/Plan: Principal Problem:   Acute encephalopathy Active Problems:   Myelodysplastic syndrome (HCC)   Diabetes mellitus ([place])   Pancytopenia (HCC)   Cerebral thrombosis with cerebral infarction   Ischemic stroke (HCC)  Acute right basal ganglia lacunar infarct/new 5-7 punctate foci of acute infarctions scattered within the inferior cerebellum on both sides, suggestive of embolic disease from the heart or ascending aorta. Reviewed MRI brain done on 09/02/20 -Gradually return to normotensive blood pressure. MRI  brain revealed acute small right basal ganglia lacunar infarct likely secondary to small vessel disease.  She completed a stroke work-up.  MRA brain no large vessel  obstruction.  Moderate L A1 stenosis.  Carotid Doppler ultrasound unremarkable.  2D echo EF 75%, no source of embolus.  EEG suggestive of mild diffuse encephalopathy, nonspecific etiology.  No seizures or epileptiform discharges were seen throughout the recording.  LDL 35.  Hemoglobin A1c 6.0. Seen by neurology/stroke team with recommendation for aspirin 81 mg daily and Plavix 75 mg daily for 3 weeks followed by aspirin alone. PT OT recommended SNF. Cardiology master contacted to arrange for 30 day cardiac event monitoring outpatient to rule out occult A. Fib. Bilateral lower extremity Doppler ultrasound negative for DVT. CTA head and neck, unremarkable. Neurology signed off.  She will follow-up outpatient at [facility] in about 4 weeks.  Acute metabolic encephalopathy:  Slightly improving. UA positive for pyuria on 08/22/2020 however urine culture was inconclusive, grew multiple species on 08/22/2020, and then repeated urine culture showed no growth on 08/29/2020. Family is convinced that the patient has been urinary tract infection.  She has received 3 days of IV antibiotics.  Restarted Rocephin on 09/02/2020 for presumptive UTI.  Also started Florastor 250 mg twice daily. She presented with a fever of 100.5 in the ED.  Was started on broad-spectrum antibiotics. -Patient remained afebrile.  Blood culture so far negative.   -B12, folate, TSH, ammonia level, RPR: All came back within normal limits. -EEG negative for seizures -CT renal stone study, right upper quadrant ultrasound, chest x-ray: Negative for acute findings.  COVID-19 negative.   -Initial urine culture shows multiple species.  Repeat urine culture came back negative. -Received IV cefepime for 1 day and then 2 days of IV Rocephin, then it was DC'd.  Continue Rocephin and IV fluid for now, will switch to oral antibiotics in the morning for DC planning. Lactic acid 1.0, procalcitonin 0.16  Heterogeneous left thyroid lobe nodule measuring 1.2  cm, incidental findings TSH normal 1.571 on 08/13/2020 She will need to have ultrasound and follow-up outpatient Informed her son at bedside.  Hypertension: Gradually normalized BP. Resume home losartan 25 mg twice daily. Continue to monitor blood pressure  Hyperlipidemia:  Goal LDL less than 70, LDL 35 on 08/28/2020.  Continue statin  Type 2 diabetes mellitus: Well controlled.  A1c 6.0%.  Goal hemoglobin A1c less than 7.0. Continue sliding scale insulin while inpatient.  Myelodysplastic syndrome and pancytopenia: -Followed by oncologist Dr. Florene Glen at [facility]. -WBC/H&H/platelet: At baseline.  Continue to monitor.   -Continue Revlimid She will need to closely follow-up with her oncologist.  Post hospitalization due to pancytopenia.  Refractory hypokalemia:  Serum potassium 2.9, repleted orally.  Recheck in the morning.  Hyponatremia:  Sodium 132.    Started normal saline at 75 cc/h x 1 day. Repeat in the morning.  Constipation:  Ordered MiraLAX and Dulcolax suppository   DVT prophylaxis: SCD, subcu Lovenox daily. Code Status: Full code Family Communication: Patient's son present at bedside and other son via phone.  Plan discussed at length. Disposition Plan: SNF tomorrow 09/03/2020.  Consultants:   Neurology  Procedures:   MRI/MRA brain  EEG  Carotid Doppler  CT renal study  Right upper quadrant ultrasound  Antimicrobials:   Cefepime  Vancomycin  Rocephin    Status is: Inpatient    Dispo: The patient is from: Home.              Anticipated d/c is to:  SNF.              Anticipated d/c date is: 09/04/2020.               Patient currently not stable for DC due to acute metabolic encephalopathy, treatment for presumptive UTI, and IV hydration.        Objective: Vitals:   09/03/20 0411 09/03/20 0807 09/03/20 1156 09/03/20 1615  BP: (!) 157/68 (!) 157/67 (!) 162/66 (!) 167/76  Pulse: 77 77 79 78  Resp: 17 20 18 20   Temp: 97.8  F (36.6 C) 98.5 F (36.9 C) 98.1 F (36.7 C) 98.4 F (36.9 C)  TempSrc: Oral Oral Oral Oral  SpO2: 95% 95% 99% 97%    Intake/Output Summary (Last 24 hours) at 09/03/2020 1817 Last data filed at 09/03/2020 1710 Gross per 24 hour  Intake 1508.51 ml  Output --  Net 1508.51 ml   There were no vitals filed for this visit.  Exam:  . General: 84 y.o. year-old female well-developed well-nourished in no acute stress.  Alert and pleasant.  Oriented x2. . Cardiovascular: Regular rate and rhythm no rubs or gallops.  Marland Kitchen Respiratory: Clear to auscultation no wheezes or rales.   . Abdomen: Soft nontender no bowel sounds present.  . Musculoskeletal: No lower extremity edema bilaterally. Marland Kitchen Psychiatry: Mood is appropriate for condition  Data Reviewed: CBC: Recent Labs  Lab 08/29/20 0324 08/30/20 0225 08/31/20 0246 09/02/20 0217 09/03/20 1017  WBC 2.4* 2.5* 2.3* 1.9* 1.7*  NEUTROABS  --   --   --   --  1.1*  HGB 9.3* 9.8* 9.1* 9.4* 9.7*  HCT 26.5* 29.5* 27.7* 26.8* 27.2*  MCV 88.0 87.5 89.4 86.2 86.1  PLT 115* 125* 117* 116* [id number]*   Basic Metabolic Panel: Recent Labs  Lab 08/29/20 0324 08/30/20 0225 08/31/20 0246 09/02/20 0217 09/03/20 1017  NA 133* 132* 133* 132* 132*  K 3.1* 3.8 3.5 2.9* 3.6  CL 101 101 99 96* 98  CO2 21* 22 24 25 26   GLUCOSE 141* 143* 165* 152* 212*  BUN 9 7* 9 8 9   CREATININE 0.76 0.64 0.64 0.61 0.57  CALCIUM 8.2* 8.5* 8.7* 8.7* 8.4*  MG 1.6*  --   --   --  1.8   GFR: Estimated Creatinine Clearance: 49.2 mL/min (by C-G formula based on SCr of 0.57 mg/dL). Liver Function Tests: No results for input(s): AST, ALT, ALKPHOS, BILITOT, PROT, ALBUMIN in the last 168 hours. No results for input(s): LIPASE, AMYLASE in the last 168 hours. Recent Labs  Lab 08/23/2020 2356  AMMONIA 11   Coagulation Profile: No results for input(s): INR, PROTIME in the last 168 hours. Cardiac Enzymes: No results for input(s): CKTOTAL, CKMB, CKMBINDEX, TROPONINI in the last  168 hours. BNP (last 3 results) No results for input(s): PROBNP in the last 8760 hours. HbA1C: No results for input(s): HGBA1C in the last 72 hours. CBG: Recent Labs  Lab 09/02/20 1145 09/02/20 1816 09/02/20 2132 09/03/20 0627 09/03/20 1156  GLUCAP 256* 161* 159* 166* 165*   Lipid Profile: No results for input(s): CHOL, HDL, LDLCALC, TRIG, CHOLHDL, LDLDIRECT in the last 72 hours. Thyroid Function Tests: No results for input(s): TSH, T4TOTAL, FREET4, T3FREE, THYROIDAB in the last 72 hours. Anemia Panel: No results for input(s): VITAMINB12, FOLATE, FERRITIN, TIBC, IRON, RETICCTPCT in the last 72 hours. Urine analysis:    Component Value Date/Time   COLORURINE YELLOW 09/01/2020 1951   APPEARANCEUR HAZY (A) 09/01/2020 1951   LABSPEC 1.015 09/01/2020  [place] 5.0 09/01/2020 1951   GLUCOSEU >=500 (A) 09/01/2020 1951   HGBUR MODERATE (A) 09/01/2020 [place] NEGATIVE 09/01/2020 1951   KETONESUR 20 (A) 09/01/2020 1951   PROTEINUR 30 (A) 09/01/2020 1951   NITRITE NEGATIVE 09/01/2020 1951   LEUKOCYTESUR MODERATE (A) 09/01/2020 1951   Sepsis Labs: @LABRCNTIP (procalcitonin:4,lacticidven:4)  ) Recent Results (from the past 240 hour(s))  Culture, Urine     Status: Abnormal   Collection Time: 08/08/2020  6:56 PM   Specimen: Urine, Random  Result Value Ref Range Status   Specimen Description URINE, RANDOM  Final   Special Requests   Final    NONE Performed at [facility], [place] [address], [place], [place] [address]    Culture MULTIPLE SPECIES PRESENT, SUGGEST RECOLLECTION (A)  Final   Report Status 08/29/2020 FINAL  Final  Resp Panel by RT-PCR (Flu A&B, Covid) Nasopharyngeal Swab     Status: None   Collection Time: 08/12/2020 10:15 PM   Specimen: Nasopharyngeal Swab; Nasopharyngeal(NP) swabs in vial transport medium  Result Value Ref Range Status   SARS Coronavirus 2 by RT PCR NEGATIVE NEGATIVE Final    Comment: (NOTE) SARS-CoV-2 target nucleic acids are NOT  DETECTED.  The SARS-CoV-2 RNA is generally detectable in upper respiratory specimens during the acute phase of infection. The lowest concentration of SARS-CoV-2 viral copies this assay can detect is 138 copies/mL. A negative result does not preclude SARS-Cov-2 infection and should not be used as the sole basis for treatment or other patient management decisions. A negative result may occur with  improper specimen collection/handling, submission of specimen other than nasopharyngeal swab, presence of viral mutation(s) within the areas targeted by this assay, and inadequate number of viral copies(<138 copies/mL). A negative result must be combined with clinical observations, patient history, and epidemiological information. The expected result is Negative.  Fact Sheet for Patients:  EntrepreneurPulse.com.au  Fact Sheet for Healthcare Providers:  IncredibleEmployment.be  This test is no t yet approved or cleared by the [place] FDA and  has been authorized for detection and/or diagnosis of SARS-CoV-2 by FDA under an Emergency Use Authorization (EUA). This EUA will remain  in effect (meaning this test can be used) for the duration of the COVID-19 declaration under Section 564(b)(1) of the Act, 21 U.S.C.section 360bbb-3(b)(1), unless the authorization is terminated  or revoked sooner.       Influenza A by PCR NEGATIVE NEGATIVE Final   Influenza B by PCR NEGATIVE NEGATIVE Final    Comment: (NOTE) The Xpert Xpress SARS-CoV-2/FLU/RSV plus assay is intended as an aid in the diagnosis of influenza from Nasopharyngeal swab specimens and should not be used as a sole basis for treatment. Nasal washings and aspirates are unacceptable for Xpert Xpress SARS-CoV-2/FLU/RSV testing.  Fact Sheet for Patients: EntrepreneurPulse.com.au  Fact Sheet for Healthcare Providers: IncredibleEmployment.be  This test is not yet  approved or cleared by the [place] FDA and has been authorized for detection and/or diagnosis of SARS-CoV-2 by FDA under an Emergency Use Authorization (EUA). This EUA will remain in effect (meaning this test can be used) for the duration of the COVID-19 declaration under Section 564(b)(1) of the Act, 21 U.S.C. section 360bbb-3(b)(1), unless the authorization is terminated or revoked.  Performed at [facility], [place] [address], [place], [place] [address]   Culture, blood (routine x 2)     Status: None   Collection Time: 08/28/20  4:26 AM   Specimen: BLOOD RIGHT HAND  Result Value Ref Range Status   Specimen Description BLOOD RIGHT HAND  Final   Special Requests   Final    BOTTLES DRAWN AEROBIC AND ANAEROBIC Blood Culture adequate volume   Culture   Final    NO GROWTH 5 DAYS Performed at [facility], [address], [place], [place] [address]    Report Status 09/02/2020 FINAL  Final  Culture, blood (routine x 2)     Status: None   Collection Time: 08/28/20  4:35 AM   Specimen: BLOOD LEFT WRIST  Result Value Ref Range Status   Specimen Description BLOOD LEFT WRIST  Final   Special Requests   Final    BOTTLES DRAWN AEROBIC AND ANAEROBIC Blood Culture adequate volume   Culture   Final    NO GROWTH 5 DAYS Performed at [facility], [place] [address], [place], [place] [address]    Report Status 09/02/2020 FINAL  Final  Culture, Urine     Status: None   Collection Time: 08/29/20  7:35 AM   Specimen: Urine, Random  Result Value Ref Range Status   Specimen Description URINE, RANDOM  Final   Special Requests NONE  Final   Culture   Final    NO GROWTH Performed at [facility], [place] [address], [place], [place] [address]    Report Status 08/30/2020 FINAL  Final  Resp Panel by RT-PCR (Flu A&B, Covid) Nasopharyngeal Swab     Status: None   Collection Time: 08/31/20 12:10 PM   Specimen: Nasopharyngeal Swab; Nasopharyngeal(NP) swabs in vial transport medium   Result Value Ref Range Status   SARS Coronavirus 2 by RT PCR NEGATIVE NEGATIVE Final    Comment: (NOTE) SARS-CoV-2 target nucleic acids are NOT DETECTED.  The SARS-CoV-2 RNA is generally detectable in upper respiratory specimens during the acute phase of infection. The lowest concentration of SARS-CoV-2 viral copies this assay can detect is 138 copies/mL. A negative result does not preclude SARS-Cov-2 infection and should not be used as the sole basis for treatment or other patient management decisions. A negative result may occur with  improper specimen collection/handling, submission of specimen other than nasopharyngeal swab, presence of viral mutation(s) within the areas targeted by this assay, and inadequate number of viral copies(<138 copies/mL). A negative result must be combined with clinical observations, patient history, and epidemiological information. The expected result is Negative.  Fact Sheet for Patients:  EntrepreneurPulse.com.au  Fact Sheet for Healthcare Providers:  IncredibleEmployment.be  This test is no t yet approved or cleared by the [place] FDA and  has been authorized for detection and/or diagnosis of SARS-CoV-2 by FDA under an Emergency Use Authorization (EUA). This EUA will remain  in effect (meaning this test can be used) for the duration of the COVID-19 declaration under Section 564(b)(1) of the Act, 21 U.S.C.section 360bbb-3(b)(1), unless the authorization is terminated  or revoked sooner.       Influenza A by PCR NEGATIVE NEGATIVE Final   Influenza B by PCR NEGATIVE NEGATIVE Final    Comment: (NOTE) The Xpert Xpress SARS-CoV-2/FLU/RSV plus assay is intended as an aid in the diagnosis of influenza from Nasopharyngeal swab specimens and should not be used as a sole basis for treatment. Nasal washings and aspirates are unacceptable for Xpert Xpress SARS-CoV-2/FLU/RSV testing.  Fact Sheet for  Patients: EntrepreneurPulse.com.au  Fact Sheet for Healthcare Providers: IncredibleEmployment.be  This test is not yet approved or cleared by the [place] and has been authorized  for detection and/or diagnosis of SARS-CoV-2 by FDA under an Emergency Use Authorization (EUA). This EUA will remain in effect (meaning this test can be used) for the duration of the COVID-19 declaration under Section 564(b)(1) of the Act, 21 U.S.C. section 360bbb-3(b)(1), unless the authorization is terminated or revoked.  Performed at [facility], [place] [address], [place], [place] [address]   SARS CORONAVIRUS 2 (TAT 6-24 HRS) Nasopharyngeal Nasopharyngeal Swab     Status: None   Collection Time: 09/01/20  4:38 PM   Specimen: Nasopharyngeal Swab  Result Value Ref Range Status   SARS Coronavirus 2 NEGATIVE NEGATIVE Final    Comment: (NOTE) SARS-CoV-2 target nucleic acids are NOT DETECTED.  The SARS-CoV-2 RNA is generally detectable in upper and lower respiratory specimens during the acute phase of infection. Negative results do not preclude SARS-CoV-2 infection, do not rule out co-infections with other pathogens, and should not be used as the sole basis for treatment or other patient management decisions. Negative results must be combined with clinical observations, patient history, and epidemiological information. The expected result is Negative.  Fact Sheet for Patients: SugarRoll.be  Fact Sheet for Healthcare Providers: https://www.woods-mathews.com/  This test is not yet approved or cleared by the [place] FDA and  has been authorized for detection and/or diagnosis of SARS-CoV-2 by FDA under an Emergency Use Authorization (EUA). This EUA will remain  in effect (meaning this test can be used) for the duration of the COVID-19 declaration under Se ction 564(b)(1) of the Act, 21 U.S.C. section  360bbb-3(b)(1), unless the authorization is terminated or revoked sooner.  Performed at [facility], [place] [address] [place], [place] [address]       Studies: VAS [place] LOWER EXTREMITY VENOUS (DVT)  Result Date: 09/03/2020  Lower Venous DVT Study Other Indications: CVA (cryptogenic). Performing Technologist: Rogelia Rohrer  Examination Guidelines: A complete evaluation includes B-mode imaging, spectral Doppler, color Doppler, and power Doppler as needed of all accessible portions of each vessel. Bilateral testing is considered an integral part of a complete examination. Limited examinations for reoccurring indications may be performed as noted. The reflux portion of the exam is performed with the patient in reverse Trendelenburg.  +---------+---------------+---------+-----------+----------+--------------+ RIGHT    CompressibilityPhasicitySpontaneityPropertiesThrombus Aging +---------+---------------+---------+-----------+----------+--------------+ CFV      Full           Yes      Yes                                 +---------+---------------+---------+-----------+----------+--------------+ SFJ      Full                                                        +---------+---------------+---------+-----------+----------+--------------+ FV Prox  Full           Yes      Yes                                 +---------+---------------+---------+-----------+----------+--------------+ FV Mid   Full           Yes      Yes                                 +---------+---------------+---------+-----------+----------+--------------+  FV DistalFull           Yes      Yes                                 +---------+---------------+---------+-----------+----------+--------------+ PFV      Full                                                        +---------+---------------+---------+-----------+----------+--------------+ POP      Full           Yes      Yes                                  +---------+---------------+---------+-----------+----------+--------------+ PTV      Full                                                        +---------+---------------+---------+-----------+----------+--------------+ PERO     Full                                                        +---------+---------------+---------+-----------+----------+--------------+   +---------+---------------+---------+-----------+----------+--------------+ LEFT     CompressibilityPhasicitySpontaneityPropertiesThrombus Aging +---------+---------------+---------+-----------+----------+--------------+ CFV      Full           Yes      Yes                                 +---------+---------------+---------+-----------+----------+--------------+ SFJ      Full                                                        +---------+---------------+---------+-----------+----------+--------------+ FV Prox  Full           Yes      Yes                                 +---------+---------------+---------+-----------+----------+--------------+ FV Mid   Full           Yes      Yes                                 +---------+---------------+---------+-----------+----------+--------------+ FV DistalFull           Yes      Yes                                 +---------+---------------+---------+-----------+----------+--------------+ PFV      Full                                                        +---------+---------------+---------+-----------+----------+--------------+  POP      Full           Yes      Yes                                 +---------+---------------+---------+-----------+----------+--------------+ PTV      Full                                                        +---------+---------------+---------+-----------+----------+--------------+ PERO     Full                                                         +---------+---------------+---------+-----------+----------+--------------+     Summary: RIGHT: - There is no evidence of deep vein thrombosis in the lower extremity.  - No cystic structure found in the popliteal fossa.  LEFT: - There is no evidence of deep vein thrombosis in the lower extremity.  - No cystic structure found in the popliteal fossa.  *See table(s) above for measurements and observations.    Preliminary     Scheduled Meds: . aspirin  81 mg Oral Daily  . clopidogrel  75 mg Oral Daily  . enoxaparin (LOVENOX) injection  40 mg Subcutaneous Q24H  . feeding supplement  237 mL Oral TID BM  . insulin aspart  0-9 Units Subcutaneous TID WC  . lenalidomide  10 mg Oral Daily  . LORazepam  0.5 mg Intravenous Once  . losartan  25 mg Oral QHS  . multivitamin with minerals  1 tablet Oral Daily  . polyethylene glycol  17 g Oral Daily  . rosuvastatin  10 mg Oral q1800  . saccharomyces boulardii  250 mg Oral BID  . senna-docusate  1 tablet Oral BID  . vitamin B-12  1,000 mcg Oral Daily    Continuous Infusions: . cefTRIAXone (ROCEPHIN)  IV 1 g (09/02/20 1629)     LOS: 6 days     Darlin Drop, MD [facility] Pager [phone]  If 7PM-7AM, please contact night-coverage [email or web address] Password [place] 09/03/2020, 6:17 PM

## 2020-09-03 NOTE — Progress Notes (Signed)
STROKE TEAM PROGRESS NOTE   INTERVAL HISTORY Son at the bedside. Pt lying in bed, more awake alert and interactive today, still not fully orientated but no focal deficit. Negative for LE DVT and CTA head and neck unremarkable.   Vitals:   09/03/20 0015 09/03/20 0411 09/03/20 0807 09/03/20 1156  BP: (!) 155/54 (!) 157/68 (!) 157/67 (!) 162/66  Pulse: 75 77 77 79  Resp: 17 17 20 18   Temp: 98.2 F (36.8 C) 97.8 F (36.6 C) 98.5 F (36.9 C) 98.1 F (36.7 C)  TempSrc: Oral Oral Oral Oral  SpO2: 96% 95% 95% 99%   CBC:  Recent Labs  Lab 09/02/20 0217 09/03/20 1017  WBC 1.9* 1.7*  NEUTROABS  --  1.1*  HGB 9.4* 9.7*  HCT 26.8* 27.2*  MCV 86.2 86.1  PLT 116* 113*   Basic Metabolic Panel:  Recent Labs  Lab 08/29/20 0324 08/30/20 0225 09/02/20 0217 09/03/20 1017  NA 133*   < > 132* 132*  K 3.1*   < > 2.9* 3.6  CL 101   < > 96* 98  CO2 21*   < > 25 26  GLUCOSE 141*   < > 152* 212*  BUN 9   < > 8 9  CREATININE 0.76   < > 0.61 0.57  CALCIUM 8.2*   < > 8.7* 8.4*  MG 1.6*  --   --  1.8   < > = values in this interval not displayed.   Lipid Panel:  Recent Labs  Lab 08/28/20 0455  CHOL 99  TRIG 118  HDL 40*  CHOLHDL 2.5  VLDL 24  LDLCALC 35   HgbA1c:  Recent Labs  Lab 08/28/20 0455  HGBA1C 6.0*   Urine Drug Screen: No results for input(s): LABOPIA, COCAINSCRNUR, LABBENZ, AMPHETMU, THCU, LABBARB in the last 168 hours.  Alcohol Level No results for input(s): ETH in the last 168 hours.  IMAGING past 24 hours CT ANGIO HEAD W OR WO CONTRAST  Result Date: 09/02/2020 CLINICAL DATA:  Stroke/TIA. Assess intracranial and extracranial arteries. EXAM: CT ANGIOGRAPHY HEAD AND NECK TECHNIQUE: Multidetector CT imaging of the head and neck was performed using the standard protocol during bolus administration of intravenous contrast. Multiplanar CT image reconstructions and MIPs were obtained to evaluate the vascular anatomy. Carotid stenosis measurements (when applicable) are  obtained utilizing NASCET criteria, using the distal internal carotid diameter as the denominator. CONTRAST:  63mL OMNIPAQUE IOHEXOL 350 MG/ML SOLN COMPARISON:  MRI of the brain September 03, 2019. FINDINGS: CT HEAD FINDINGS Brain: No evidence of acute infarction, hemorrhage, hydrocephalus, extra-axial collection or mass lesion/mass effect. Acute small infarcts are better demonstrated on recent MRI of the brain performed earlier today. Vascular: Calcified plaques in the bilateral carotid siphons and bilateral vertebral arteries. Skull: Normal. Negative for fracture or focal lesion. Sinuses: Imaged portions are clear. Orbits: No acute finding. Review of the MIP images confirms the above findings CTA NECK FINDINGS Aortic arch: Standard branching. Imaged portion shows no evidence of aneurysm or dissection. No significant stenosis of the major arch vessel origins. Right carotid system: Increased tortuosity of the innominate and proximal right common carotid artery and distal cervical segment of the right ICA. Mild atherosclerotic changes in the right carotid bifurcation. No evidence of dissection, stenosis (50% or greater) or occlusion. Left carotid system: Mild luminal irregularity cervical segment of the left ICA, likely related to mild atherosclerotic disease without stenosis. There is also increased tortuosity of the upper cervical segment of the left  ICA. No evidence of dissection, stenosis (50% or greater) or occlusion. Vertebral arteries: The left vertebral artery is dominant. No evidence of dissection, stenosis (50% or greater) or occlusion. Skeleton: Degenerative changes of the cervical. No aggressive bone lesion seen. Other neck: Heterogeneous left thyroid lobe nodule measuring 1.2 cm. Upper chest: Biapical scarring and ground-glass opacities. Review of the MIP images confirms the above findings CTA HEAD FINDINGS Anterior circulation: No significant stenosis, proximal occlusion, aneurysm, or vascular  malformation. Posterior circulation: Minutes if caliber of the V4 segment of the right vertebral artery with a dominant left vertebral artery. No significant stenosis, proximal occlusion, aneurysm, or vascular malformation. Venous sinuses: As permitted by contrast timing, patent. Anatomic variants: None. Review of the MIP images confirms the above findings IMPRESSION: 1. No large vessel occlusion, hemodynamically significant stenosis, or evidence of dissection. 2. Heterogeneous left thyroid lobe nodule measuring 1.2 cm. Electronically Signed   By: Pedro Earls M.D.   On: 09/02/2020 18:17   CT ANGIO NECK W OR WO CONTRAST  Result Date: 09/02/2020 CLINICAL DATA:  Stroke/TIA. Assess intracranial and extracranial arteries. EXAM: CT ANGIOGRAPHY HEAD AND NECK TECHNIQUE: Multidetector CT imaging of the head and neck was performed using the standard protocol during bolus administration of intravenous contrast. Multiplanar CT image reconstructions and MIPs were obtained to evaluate the vascular anatomy. Carotid stenosis measurements (when applicable) are obtained utilizing NASCET criteria, using the distal internal carotid diameter as the denominator. CONTRAST:  57mL OMNIPAQUE IOHEXOL 350 MG/ML SOLN COMPARISON:  MRI of the brain September 03, 2019. FINDINGS: CT HEAD FINDINGS Brain: No evidence of acute infarction, hemorrhage, hydrocephalus, extra-axial collection or mass lesion/mass effect. Acute small infarcts are better demonstrated on recent MRI of the brain performed earlier today. Vascular: Calcified plaques in the bilateral carotid siphons and bilateral vertebral arteries. Skull: Normal. Negative for fracture or focal lesion. Sinuses: Imaged portions are clear. Orbits: No acute finding. Review of the MIP images confirms the above findings CTA NECK FINDINGS Aortic arch: Standard branching. Imaged portion shows no evidence of aneurysm or dissection. No significant stenosis of the major arch vessel  origins. Right carotid system: Increased tortuosity of the innominate and proximal right common carotid artery and distal cervical segment of the right ICA. Mild atherosclerotic changes in the right carotid bifurcation. No evidence of dissection, stenosis (50% or greater) or occlusion. Left carotid system: Mild luminal irregularity cervical segment of the left ICA, likely related to mild atherosclerotic disease without stenosis. There is also increased tortuosity of the upper cervical segment of the left ICA. No evidence of dissection, stenosis (50% or greater) or occlusion. Vertebral arteries: The left vertebral artery is dominant. No evidence of dissection, stenosis (50% or greater) or occlusion. Skeleton: Degenerative changes of the cervical. No aggressive bone lesion seen. Other neck: Heterogeneous left thyroid lobe nodule measuring 1.2 cm. Upper chest: Biapical scarring and ground-glass opacities. Review of the MIP images confirms the above findings CTA HEAD FINDINGS Anterior circulation: No significant stenosis, proximal occlusion, aneurysm, or vascular malformation. Posterior circulation: Minutes if caliber of the V4 segment of the right vertebral artery with a dominant left vertebral artery. No significant stenosis, proximal occlusion, aneurysm, or vascular malformation. Venous sinuses: As permitted by contrast timing, patent. Anatomic variants: None. Review of the MIP images confirms the above findings IMPRESSION: 1. No large vessel occlusion, hemodynamically significant stenosis, or evidence of dissection. 2. Heterogeneous left thyroid lobe nodule measuring 1.2 cm. Electronically Signed   By: Sandre Kitty.D.  On: 09/02/2020 18:17   VAS [place] LOWER EXTREMITY VENOUS (DVT)  Result Date: 09/03/2020  Lower Venous DVT Study Other Indications: CVA (cryptogenic). Performing Technologist: Rogelia Rohrer  Examination Guidelines: A complete evaluation includes B-mode imaging, spectral Doppler, color  Doppler, and power Doppler as needed of all accessible portions of each vessel. Bilateral testing is considered an integral part of a complete examination. Limited examinations for reoccurring indications may be performed as noted. The reflux portion of the exam is performed with the patient in reverse Trendelenburg.  +---------+---------------+---------+-----------+----------+--------------+ RIGHT    CompressibilityPhasicitySpontaneityPropertiesThrombus Aging +---------+---------------+---------+-----------+----------+--------------+ CFV      Full           Yes      Yes                                 +---------+---------------+---------+-----------+----------+--------------+ SFJ      Full                                                        +---------+---------------+---------+-----------+----------+--------------+ FV Prox  Full           Yes      Yes                                 +---------+---------------+---------+-----------+----------+--------------+ FV Mid   Full           Yes      Yes                                 +---------+---------------+---------+-----------+----------+--------------+ FV DistalFull           Yes      Yes                                 +---------+---------------+---------+-----------+----------+--------------+ PFV      Full                                                        +---------+---------------+---------+-----------+----------+--------------+ POP      Full           Yes      Yes                                 +---------+---------------+---------+-----------+----------+--------------+ PTV      Full                                                        +---------+---------------+---------+-----------+----------+--------------+ PERO     Full                                                        +---------+---------------+---------+-----------+----------+--------------+    +---------+---------------+---------+-----------+----------+--------------+  LEFT     CompressibilityPhasicitySpontaneityPropertiesThrombus Aging +---------+---------------+---------+-----------+----------+--------------+ CFV      Full           Yes      Yes                                 +---------+---------------+---------+-----------+----------+--------------+ SFJ      Full                                                        +---------+---------------+---------+-----------+----------+--------------+ FV Prox  Full           Yes      Yes                                 +---------+---------------+---------+-----------+----------+--------------+ FV Mid   Full           Yes      Yes                                 +---------+---------------+---------+-----------+----------+--------------+ FV DistalFull           Yes      Yes                                 +---------+---------------+---------+-----------+----------+--------------+ PFV      Full                                                        +---------+---------------+---------+-----------+----------+--------------+ POP      Full           Yes      Yes                                 +---------+---------------+---------+-----------+----------+--------------+ PTV      Full                                                        +---------+---------------+---------+-----------+----------+--------------+ PERO     Full                                                        +---------+---------------+---------+-----------+----------+--------------+     Summary: RIGHT: - There is no evidence of deep vein thrombosis in the lower extremity.  - No cystic structure found in the popliteal fossa.  LEFT: - There is no evidence of deep vein thrombosis in the lower extremity.  - No cystic structure found in the popliteal fossa.  *See table(s) above for measurements and observations.    Preliminary  PHYSICAL EXAM    Temp:  [97.8 F (36.6 C)-98.6 F (37 C)] 98.1 F (36.7 C) (12/30 1156) Pulse Rate:  [75-79] 79 (12/30 1156) Resp:  [17-20] 18 (12/30 1156) BP: (155-162)/(54-70) 162/66 (12/30 1156) SpO2:  [95 %-99 %] 99 % (12/30 1156)  General - Well nourished, well developed, in no apparent distress  Ophthalmologic - fundi not visualized due to noncooperation.  Cardiovascular - Regular rhythm and rate.  Neuro - awake alert orientated to age, place, people, but not to time. Can not tell the name of hospital though. She has paucity of speech but answer questions appropriately. She follows all simple commands. Able to name 4/4 and slow repeat 5-word sentence. No apraxia. Blinking to visual threat bilaterally, able to track in both sides. Facial symmetrical. Tongue midline. BUE symmetrical strength no drift, FTN intact bilaterally although slow. BLE withdraw to pain at least 3/5 proximally and distally. Sensation subjectively symmetrical and gait not tested.   ASSESSMENT/PLAN [name] is a 84 y.o. female with history of DM2, HTN, Myelodysplastic syndrome, and HLD presenting with worsening dizziness and falls over past 1 month, waxing and waning confusion along with hallucinations.    Stroke:   R basal ganglia, as well as b/l cerebellar multifocal punctate infarcts secondary to unclear source.   MRI 12/24 Acute small R basal ganglia lacunar infarct (couple of days old). Small vessel disease.   MRA  No LVO. Moderate L A1 stenosis   Carotid Doppler - unremarkable  2D Echo EF 75%. No source of embolus   EEGs suggest mild diffuse onset encephalopathy.   MRI 12/29 Newly seen 5-7 punctate foci of acute infarctions scattered within the inferior cerebellum on both sides.  CTA head and neck unremarkable  LE venous doppler no DVT  Recommend 30 day cardiac event monitoring as outpt to rule out afib  LDL 35  HgbA1c 6.0  Vitamin B12, TSH were normal and RPR was  negative  VTE prophylaxis - SCDs   aspirin 81 mg daily prior to admission, now on aspirin 81 mg daily and Plavix 75 mg daily for 3 weeks followed by aspirin alone.  Therapy recommendations: SNF  Disposition:  pending   Encephalopathy  Fluctuation of mental status  Intermittent confusion  MRI brain infarcts can not explain the mental status change  EEG mild diffuse encephalopathy  Likely related to advanced age, ? sepsis, UTI and deconditioning  Treat for underlying conditions per primary team  Suspected sepsis  Blood Cultures neg  Urine culture neg  UA WBC 6-10  WBC's 2.4->2.3->1.9   afebrile  Cefepime and Vancomycin started 12/24 (afebrile) -> rocephin -> off now  Hypertension  Stable . BP goal normotensive  Hyperlipidemia  Home meds:  Crestor 10, resumed in hospital  LDL 35, goal < 70  Continue statin at discharge  Diabetes type II controlled  HgbA1c 6.0, goal < 7.0  SSI  CBG monitoring  Close PCP follow up  Other Stroke Risk Factors  Advanced Age >/= 60   Other Active Problems, Findings and Recommendations  Myelodysplastic syndrome with pancytopenia, stable  Hypokalemia - 3.1->3.6  Hospital day # 6  Neurology will sign off. Please call with questions. Pt will follow up with stroke clinic NP at [facility] in about 4 weeks. Thanks for the consult.   Rosalin Hawking, MD PhD Stroke Neurology 09/03/2020 2:27 PM  To contact Stroke Continuity provider, please refer to [email or web address] After hours, contact General Neurology

## 2020-09-04 ENCOUNTER — Other Ambulatory Visit: Payer: Self-pay | Admitting: Medical

## 2020-09-04 ENCOUNTER — Inpatient Hospital Stay (HOSPITAL_COMMUNITY): Payer: Medicare PPO

## 2020-09-04 DIAGNOSIS — E1165 Type 2 diabetes mellitus with hyperglycemia: Secondary | ICD-10-CM | POA: Diagnosis not present

## 2020-09-04 DIAGNOSIS — I6330 Cerebral infarction due to thrombosis of unspecified cerebral artery: Secondary | ICD-10-CM

## 2020-09-04 DIAGNOSIS — I639 Cerebral infarction, unspecified: Secondary | ICD-10-CM

## 2020-09-04 DIAGNOSIS — L8990 Pressure ulcer of unspecified site, unspecified stage: Secondary | ICD-10-CM | POA: Insufficient documentation

## 2020-09-04 LAB — CBC WITH DIFFERENTIAL/PLATELET
Abs Immature Granulocytes: 0.02 10*3/uL (ref 0.00–0.07)
Basophils Absolute: 0.1 10*3/uL (ref 0.0–0.1)
Basophils Relative: 3 %
Eosinophils Absolute: 0.1 10*3/uL (ref 0.0–0.5)
Eosinophils Relative: 3 %
HCT: 27.1 % — ABNORMAL LOW (ref 36.0–46.0)
Hemoglobin: 9.6 g/dL — ABNORMAL LOW (ref 12.0–15.0)
Immature Granulocytes: 1 %
Lymphocytes Relative: 8 %
Lymphs Abs: 0.1 10*3/uL — ABNORMAL LOW (ref 0.7–4.0)
MCH: 30.3 pg (ref 26.0–34.0)
MCHC: 35.4 g/dL (ref 30.0–36.0)
MCV: 85.5 fL (ref 80.0–100.0)
Monocytes Absolute: 0.4 10*3/uL (ref 0.1–1.0)
Monocytes Relative: 21 %
Neutro Abs: 1.2 10*3/uL — ABNORMAL LOW (ref 1.7–7.7)
Neutrophils Relative %: 64 %
Platelets: 113 10*3/uL — ABNORMAL LOW (ref 150–400)
RBC: 3.17 MIL/uL — ABNORMAL LOW (ref 3.87–5.11)
RDW: 15.6 % — ABNORMAL HIGH (ref 11.5–15.5)
WBC: 1.8 10*3/uL — ABNORMAL LOW (ref 4.0–10.5)
nRBC: 0 % (ref 0.0–0.2)

## 2020-09-04 LAB — COMPREHENSIVE METABOLIC PANEL
ALT: 13 U/L (ref 0–44)
ALT: 12 U/L (ref 0–44)
AST: 9 U/L — ABNORMAL LOW (ref 15–41)
AST: 11 U/L — ABNORMAL LOW (ref 15–41)
Albumin: 2.8 g/dL — ABNORMAL LOW (ref 3.5–5.0)
Albumin: 2.9 g/dL — ABNORMAL LOW (ref 3.5–5.0)
Alkaline Phosphatase: 65 U/L (ref 38–126)
Alkaline Phosphatase: 65 U/L (ref 38–126)
Anion gap: 9 (ref 5–15)
Anion gap: 10 (ref 5–15)
BUN: 9 mg/dL (ref 8–23)
BUN: 13 mg/dL (ref 8–23)
CO2: 27 mmol/L (ref 22–32)
CO2: 26 mmol/L (ref 22–32)
Calcium: 8.7 mg/dL — ABNORMAL LOW (ref 8.9–10.3)
Calcium: 8.8 mg/dL — ABNORMAL LOW (ref 8.9–10.3)
Chloride: 99 mmol/L (ref 98–111)
Chloride: 97 mmol/L — ABNORMAL LOW (ref 98–111)
Creatinine, Ser: 0.66 mg/dL (ref 0.44–1.00)
Creatinine, Ser: 0.59 mg/dL (ref 0.44–1.00)
GFR, Estimated: >60 mL/min (ref >60)
GFR, Estimated: >60 mL/min (ref >60)
Glucose, Bld: 162 mg/dL — ABNORMAL HIGH (ref 70–99)
Glucose, Bld: 168 mg/dL — ABNORMAL HIGH (ref 70–99)
Potassium: 3.7 mmol/L (ref 3.5–5.1)
Potassium: 3.7 mmol/L (ref 3.5–5.1)
Sodium: 135 mmol/L (ref 135–145)
Sodium: 133 mmol/L — ABNORMAL LOW (ref 135–145)
Total Bilirubin: 0.5 mg/dL (ref 0.3–1.2)
Total Bilirubin: 0.4 mg/dL (ref 0.3–1.2)
Total Protein: 5.4 g/dL — ABNORMAL LOW (ref 6.5–8.1)
Total Protein: 5.4 g/dL — ABNORMAL LOW (ref 6.5–8.1)

## 2020-09-04 LAB — RESP PANEL BY RT-PCR (FLU A&B, COVID) ARPGX2
Influenza A by PCR: NEGATIVE
Influenza B by PCR: NEGATIVE
SARS Coronavirus 2 by RT PCR: NEGATIVE

## 2020-09-04 LAB — GLUCOSE, CAPILLARY
Glucose-Capillary: 160 mg/dL — ABNORMAL HIGH (ref 70–99)
Glucose-Capillary: 245 mg/dL — ABNORMAL HIGH (ref 70–99)
Glucose-Capillary: 172 mg/dL — ABNORMAL HIGH (ref 70–99)
Glucose-Capillary: 150 mg/dL — ABNORMAL HIGH (ref 70–99)
Glucose-Capillary: 152 mg/dL — ABNORMAL HIGH (ref 70–99)

## 2020-09-04 LAB — CBC
HCT: 26 % — ABNORMAL LOW (ref 36.0–46.0)
Hemoglobin: 9.2 g/dL — ABNORMAL LOW (ref 12.0–15.0)
MCH: 30.6 pg (ref 26.0–34.0)
MCHC: 35.4 g/dL (ref 30.0–36.0)
MCV: 86.4 fL (ref 80.0–100.0)
Platelets: 116 10*3/uL — ABNORMAL LOW (ref 150–400)
RBC: 3.01 MIL/uL — ABNORMAL LOW (ref 3.87–5.11)
RDW: 15.9 % — ABNORMAL HIGH (ref 11.5–15.5)
WBC: 1.7 10*3/uL — ABNORMAL LOW (ref 4.0–10.5)
nRBC: 0 % (ref 0.0–0.2)

## 2020-09-04 MED ORDER — LOSARTAN POTASSIUM 50 MG PO TABS
50.0000 mg | ORAL_TABLET | Freq: Every day | ORAL | Status: DC
  Administered 2020-09-04: 50 mg via ORAL
  Filled 2020-09-04 (×2): qty 1

## 2020-09-04 MED ORDER — SACCHAROMYCES BOULARDII 250 MG PO CAPS: 250.0000 mg | ORAL_CAPSULE | Freq: Two times a day (BID) | ORAL | 0 refills | Status: DC

## 2020-09-04 MED ORDER — CEFDINIR 300 MG PO CAPS: 300.0000 mg | ORAL_CAPSULE | Freq: Two times a day (BID) | ORAL | 0 refills | Status: DC

## 2020-09-04 MED ORDER — CEFDINIR 300 MG PO CAPS
300.0000 mg | ORAL_CAPSULE | Freq: Two times a day (BID) | ORAL | Status: DC
  Filled 2020-09-04: qty 1

## 2020-09-04 MED ORDER — ASPIRIN 81 MG PO TABS: 81.0000 mg | ORAL_TABLET | Freq: Every day | ORAL | 0 refills | Status: AC

## 2020-09-04 MED ORDER — LOSARTAN POTASSIUM 50 MG PO TABS: 50.0000 mg | ORAL_TABLET | Freq: Every day | ORAL | 0 refills | Status: DC

## 2020-09-04 MED ORDER — ROSUVASTATIN CALCIUM 10 MG PO TABS
10.0000 mg | ORAL_TABLET | Freq: Every day | ORAL | 0 refills | Status: AC
Start: 2020-09-04 — End: 2020-12-03

## 2020-09-04 NOTE — Progress Notes (Signed)
  PROGRESS NOTE    Kara Griffin  AXE:[id number] DOB: Nov 11, 1935 DOA: 08/07/2020 PCP: [place], MD   Subjective Called by nursing regarding a concerning change in mental status.  Patient was alert and oriented X 2 all day today, but now is confused, mumbling and not oriented to self or place. When I evaluated her, 2 sons were at bedside and noted a change from this morning.  They were concerned for infection.  Patient was mumbling to self only and inattentive.  Objective Today's Vitals   09/04/20 0456 09/04/20 0742 09/04/20 1215 09/04/20 2008  BP: (!) 158/74 (!) 154/65 (!) 156/66 (!) 155/77  Pulse:  77 75 77  Resp:  18 18   Temp:  97.8 F (36.6 C) 98.2 F (36.8 C) 98.9 F (37.2 C)  TempSrc:  Oral Oral Oral  SpO2:  97% 98% 99%  PainSc:  0-No pain  0-No pain   There is no height or weight on file to calculate BMI.  General: Alert to voice, did not appear uncomfortable, mumbling.  Eyes: anicteric sclerae, EOMI HENT: Neck is supple, mildly dry MM CV: RR, NR, no apparent murmur, pulses intact in DP and radial arteries bilaterally Pulm: Breathing comfortably, no audible wheezing Abd: + TTP in the epigastrium and suprapubic areas, mild distention, soft, +BS MSK: Tender to palpation of both legs, no apparent bruising or swelling.  Neuro: Inattentive, difficult to complete exam.  Moving extremities, no pronator drift.  Orientation 0 out of 3.   Assessment Acute encephalopathy, acute change from earlier today.  Possible recrudescence of stroke, but less likely given no focal deficit, possible infectious or toxic encephalopathy  Plan  Repeat CT head Repeat blood work Repeat UA and Urine Culture She received Rocephin today, no further antibiotics until blood work results If fever, will proceed with blood cultures.   Debe Coder, MD [facility]   To contact the attending provider between 7A-7P or the covering provider during after hours 7P-7A, please log  into the web site [email or web address] and access using universal [place] password for that web site. If you do not have the password, please call the hospital operator.  09/04/2020, 8:19 PM

## 2020-09-04 NOTE — Plan of Care (Signed)
  Problem: Education: Goal: Knowledge of disease or condition will improve Outcome: Progressing Goal: Knowledge of secondary prevention will improve Outcome: Progressing Goal: Knowledge of patient specific risk factors addressed and post discharge goals established will improve Outcome: Progressing Goal: Individualized Educational Video(s) Outcome: Progressing   Problem: Coping: Goal: Will verbalize positive feelings about self Outcome: Progressing Goal: Will identify appropriate support needs Outcome: Progressing   Problem: Health Behavior/Discharge Planning: Goal: Ability to manage health-related needs will improve Outcome: Progressing   Problem: Self-Care: Goal: Ability to participate in self-care as condition permits will improve Outcome: Progressing Goal: Verbalization of feelings and concerns over difficulty with self-care will improve Outcome: Progressing Goal: Ability to communicate needs accurately will improve Outcome: Progressing   Problem: Nutrition: Goal: Risk of aspiration will decrease Outcome: Progressing   Problem: Intracerebral Hemorrhage Tissue Perfusion: Goal: Complications of Intracerebral Hemorrhage will be minimized Outcome: Progressing   Problem: Spontaneous Subarachnoid Hemorrhage Tissue Perfusion: Goal: Complications of Spontaneous Subarachnoid Hemorrhage will be minimized Outcome: Progressing

## 2020-09-04 NOTE — Progress Notes (Signed)
   Cardiology asked to arrange a 30 day monitor to r/o Afib in patient with a cryptogenic stroke. She has not followed with [facility] in the past. Will assign to Dr. Royann Shivers to read as he is in the hospital 09/04/20. Will send a message to scheduling to arrange follow-up in 2 months to review results.   Beatriz Stallion, PA-C 09/04/20; 8:17 AM

## 2020-09-04 NOTE — Discharge Summary (Addendum)
Discharge Summary  Marshea Wisher UDJ:[id number] DOB: Dec 19, 1935  PCP: Einar Pheasant, MD  Admit date: 08/10/2020 Discharge date: 09/04/2020  Time spent: 35 minutes  Recommendations for Outpatient Follow-up:  1. Follow-up with neurology 2. Follow up with cardiology Dr. Sallyanne Kuster for your 30 day monitor to rule out Afib. 3. Follow-up with your primary care provider 4. Take your medications as prescribed 5. Continue PT OT with assistance and fall precautions  Discharge Diagnoses:  Active Hospital Problems   Diagnosis Date Noted  . Acute encephalopathy 08/13/2020  . Pressure injury of skin 09/04/2020  . Cerebral thrombosis with cerebral infarction 08/28/2020  . Ischemic stroke ([place]) 08/28/2020  . Pancytopenia ([place]) 08/11/2020  . Myelodysplastic syndrome ([place]) 06/04/2012  . Diabetes mellitus ([place]) 06/04/2012    Resolved Hospital Problems  No resolved problems to display.    Discharge Condition: Stable  Diet recommendation: Resume previous diet  Vitals:   09/04/20 0742 09/04/20 1215  BP: (!) 154/65 (!) 156/66  Pulse: 77 75  Resp: 18 18  Temp: 97.8 F (36.6 C) 98.2 F (36.8 C)  SpO2: 97% 98%    History of present illness:  Patient is 84 year old female with past medical history of myelodysplastic syndrome-followed by Dr. Florene Glen at [facility] on Revlimid, pancytopenia, diabetes mellitus, hypertension, hyperlipidemia presents to emergency department with increasingly confusion since 2 days.Patient son states that since Thanksgiving almost a month and a half ago patient has become gradually weak and not herself. Recently her blood pressure was found to be low and her primary care physician decreased her antihypertensivesduring the recent visit 2 days ago. Patient also has chronic dizziness. On August 19, 2020 about 8 days ago patient was taken to [facility] after patient had a fall at home. Over the CT head C-spine and abdomen was done which showed features  concerning for right-sided mucous plugging and MAI features and was given Z-Pak. Following which patient had some couple of episodes of diarrhea. Has not recorded any fever at home. Has been having poor appetite with some nausea. CT scan also at abdomen showed some gallbladder distention with no definite signs of any inflammation.  ED Course:In the ER patient is generally weak oriented to name and place. Moving all extremities. At the time of my exam was afebrile not hypoxic Covid test was negative CBC shows pancytopenia at baseline high-sensitivity and was 8 and blood glucose of 161 sodium 132. LFTs were normal. MRI brain ordered and admitted for acute encephalopathy with generalized weakness and poor appetite.  MRI brain revealed acute small right basal ganglia lacunar infarct likely secondary to small vessel disease.  She completed a stroke work-up.  MRA brain no large vessel obstruction.  Moderate L A1 stenosis.  Carotid Doppler ultrasound unremarkable.  2D echo EF 75%, no source of embolus.  EEG suggestive of mild diffuse encephalopathy, nonspecific etiology.  No seizures or epileptiform discharges were seen throughout the recording.  LDL 35.  Hemoglobin A1c 6.0.  Seen by neurology/stroke team with recommendation for aspirin 81 mg daily and Plavix 75 mg daily for 3 weeks followed by aspirin alone. PT OT recommended SNF.  Due to concern of her son about her mentation.  Repeat MRI brain was done on 09/02/2020 which showed new 5-7 punctate foci of acute infarctions scattered within the inferior cerebellum on both sides, suggestive of embolic disease from the heart or ascending aorta.  Cardiology was contacted for arrangement of 30-day cardiac event monitoring outpatient.   09/03/20: She was seen and examined with her  son at her bedside.  She is somnolent however she is easily arousable to voices.  She was able to answer 2 out of 3 questions appropriately.  Per her son she appears to be  improving.  She is currently on IV antibiotics and IV fluid.  She was seen by neurology, we appreciate recommendations.  She had a CT a head and neck completed which was unremarkable.  Bilateral lower extremity venous Doppler ultrasound was negative for DVT.  Neurology signed off.  She will follow-up outpatient [facility] in about 4 weeks.  09/04/20: Patient was seen and examined at her bedside this morning.  There were no acute events overnight.  She is alert and oriented x2.  She is pleasantly confused.  She had no complaints.  Her son was not present in the room at the time of this visit.  Vital signs and labs have been reviewed and are stable.  Patient will need to follow-up with neurology in 4 weeks.  Cardiology has arranged for a 30-day cardiac event monitoring.  Cardiology's office will be reaching out to the patient to arrange the monitor and follow-up in 2 months to review results.  Addendum:  Spoke with the patient's son, Mr. Aaron Edelman, via phone.  He strongly believes that his mom has a UTI due to her altered mental status despite a negative urine culture.  He requested antibiotic coverage.  Ordered Cefdinir 300 mg BID x 7 days to take along with probiotics Florastor 250 mg BID x 10 days.  Advised to closely follow up with her PCP post hospitalization.  He is receptive.     Hospital Course:  Principal Problem:   Acute encephalopathy Active Problems:   Myelodysplastic syndrome ([place])   Diabetes mellitus ([place])   Pancytopenia ([place])   Cerebral thrombosis with cerebral infarction   Ischemic stroke ([facility])   Pressure injury of skin  Acute right basal ganglia lacunar infarct/new 5-7 punctate foci of acute infarctions scattered within the inferior cerebellum on both sides, suggestive of embolic disease from the heart or ascending aorta. Reviewed MRI brain done on 09/02/20 MRI brain revealed acute small right basal ganglia lacunar infarct likely secondary to small vessel disease.  She completed a  stroke work-up.  MRA brain no large vessel obstruction.  Moderate L A1 stenosis.  Carotid Doppler ultrasound unremarkable.  2D echo EF 75%, no source of embolus.  EEG suggestive of mild diffuse encephalopathy, nonspecific etiology.  No seizures or epileptiform discharges were seen throughout the recording.  LDL 35.  Hemoglobin A1c 6.0. Seen by neurology/stroke team with recommendation for aspirin 81 mg daily and Plavix 75 mg daily for 3 weeks followed by aspirin alone. PT OT recommended SNF. Cardiology master contacted to arrange for 30 day cardiac event monitoring outpatient to rule out occult A. Fib. Bilateral lower extremity Doppler ultrasound negative for DVT. CTA head and neck, unremarkable. Neurology signed off.  She will follow-up outpatient at [facility] in about 4 weeks. Cardiology has arranged for a 30-day cardiac event monitoring.  Cardiology's office will be reaching out to the patient to arrange the monitor and follow-up in 2 months to review results.  Improving acute metabolic encephalopathy likely multifactorial secondary to advanced age and acute CVA, unclear if she really had the UTI with no positive urine cultures: UA positive for pyuria on 08/11/2020 however urine culture was inconclusive, grew multiple species on 08/09/2020, and then repeated urine culture showed no growth on 08/29/2020. Family is convinced that the patient has had a urinary tract  infection.  She has received 3 days of IV antibiotics.  Restarted Rocephin on 09/02/2020 for presumptive UTI, last dose was on 09/03/2020.  Also started Florastor 250 mg twice daily. She presented with a fever of 100.5 in the ED. Was started on broad-spectrum antibiotics. -Patient has remained afebrile. Blood culture x2 - final.  -B12, folate, TSH, ammonia level, RPR: All came back within normal limits. -EEG negative for seizures -CT renal stone study, right upper quadrant ultrasound, chest x-ray: Negative for acute findings. COVID-19  negative.  -Initial urine culture shows multiple species. Repeat urine culture came back negative. -Received on admission, IV cefepime for 1 day and then 2 days ofIV Rocephin, then it was DC'd.  Lactic acid 1.0, procalcitonin 0.16 Reorient as needed.  Heterogeneous left thyroid lobe nodule measuring 1.2 cm, incidental findings TSH normal 1.571 on 08/09/2020 She will need to have ultrasound and follow-up outpatient Informed her son at bedside. Follow-up with your PCP.  Hypertension: Gradually normalized BP. Currently on losartan 50 mg daily. Follow-up with your PCP.  Hyperlipidemia: Goal LDL less than 70, LDL 35 on 08/28/2020.  Continue statin  Type 2 diabetes mellitus:Well controlled. A1c 6.0%.  Goal hemoglobin A1c less than 7.0.Continue sliding scale insulin while inpatient.  Myelodysplastic syndrome and pancytopenia: -Followed by oncologist Dr. Florene Glen at [facility]. -WBC/H&H/platelet: At baseline. Continue to monitor.  -Continue Revlimid She will need to closely follow-up with her oncologist.  Post hospitalization due to pancytopenia.  Resolved refractory hypokalemia:  Serum potassium 3.7 from 2.9, repleted orally.  Recheck in the morning.  Resolved hyponatremia: Sodium 135 from 132.  Received normal saline at 75 cc/h x 1 day.  Constipation: Received bowel regimen.    Code Status:Full code Family Communication:Patient's son present at bedside and other son via phone.  Plan discussed at length.   Consultants:  Neurology  Procedures:  MRI/MRA brain  EEG  Carotid Doppler  CT renal study  Right upper quadrant ultrasound  Antimicrobials:   Cefepime  Vancomycin  Rocephin     Discharge Exam: BP (!) 156/66 (BP Location: Right Arm)   Pulse 75   Temp 98.2 F (36.8 C) (Oral)   Resp 18   LMP 08/13/1984   SpO2 98%  . General: 84 y.o. year-old female well developed well nourished in no acute distress.  Alert and oriented  x2. . Cardiovascular: Regular rate and rhythm with no rubs or gallops.  No thyromegaly or JVD noted.   Marland Kitchen Respiratory: Clear to auscultation with no wheezes or rales. Good inspiratory effort. . Abdomen: Soft nontender nondistended with normal bowel sounds x4 quadrants. . Musculoskeletal: No lower extremity edema bilaterally.   Marland Kitchen Psychiatry: Mood is appropriate for condition and setting  Discharge Instructions You were cared for by a hospitalist during your hospital stay. If you have any questions about your discharge medications or the care you received while you were in the hospital after you are discharged, you can call the unit and asked to speak with the hospitalist on call if the hospitalist that took care of you is not available. Once you are discharged, your primary care physician will handle any further medical issues. Please note that NO REFILLS for any discharge medications will be authorized once you are discharged, as it is imperative that you return to your primary care physician (or establish a relationship with a primary care physician if you do not have one) for your aftercare needs so that they can reassess your need for medications and monitor your lab values.  Discharge Instructions    Ambulatory referral to Neurology   Complete by: As directed    Follow up with stroke clinic NP (Jessica Vanschaick or Cecille Rubin, if both not available, consider Zachery Dauer, or A[place]) at [facility] in about 4 weeks. Thanks.   Diet - low sodium heart healthy   Complete by: As directed    Discharge instructions   Complete by: As directed    Follow-up with PCP in 1 week Repeat CBC and BMP on follow-up visit Follow-up with ENT for dizziness Continue meclizine as needed for dizziness Continue Plavix for 3 weeks and then aspirin alone   Increase activity slowly   Complete by: As directed      Allergies as of 09/04/2020      Reactions   Ibuprofen    Lowers WBCs      Medication List     TAKE these medications   Accu-Chek Guide test strip Generic drug: glucose blood USE TO CHECK BLOOD SUGARS TWICE DAILY   Accu-Chek Softclix Lancets lancets CHECK BLOOD SUGAR TWICE DAILY   aspirin 81 MG tablet Take 1 tablet (81 mg total) by mouth daily.   azelastine 0.1 % nasal spray Commonly known as: ASTELIN PLACE 1 SPRAY INTO BOTH NOSTRILS TWICE DAILY What changed: See the new instructions.   blood glucose meter kit and supplies Kit Dispense based on patient and insurance preference. Use to check sugars twice daily. DX E11.9   Calcium Carbonate-Vitamin D 600-400 MG-UNIT tablet Take 1 tablet by mouth at bedtime. Reported on 01/15/2016   cefdinir 300 MG capsule Commonly known as: OMNICEF Take 1 capsule (300 mg total) by mouth 2 (two) times daily for 7 days.   clopidogrel 75 MG tablet Commonly known as: PLAVIX Take 1 tablet (75 mg total) by mouth daily for 21 days.   feeding supplement Liqd Take 237 mLs by mouth 3 (three) times daily between meals for 7 days.   fluticasone 50 MCG/ACT nasal spray Commonly known as: FLONASE Place 2 sprays into both nostrils daily.   lenalidomide 10 MG capsule Commonly known as: REVLIMID Take 10 mg by mouth daily.   losartan 50 MG tablet Commonly known as: COZAAR Take 1 tablet (50 mg total) by mouth daily. What changed:   medication strength  See the new instructions.   meclizine 12.5 MG tablet Commonly known as: ANTIVERT Take 1 tablet (12.5 mg total) by mouth 3 (three) times daily as needed for dizziness.   metFORMIN 1000 MG tablet Commonly known as: GLUCOPHAGE TAKE 1 TABLET(1000 MG) BY MOUTH TWICE DAILY What changed: See the new instructions.   multivitamin tablet Take 1 tablet by mouth daily.   polyethylene glycol 17 g packet Commonly known as: MIRALAX / GLYCOLAX Take 17 g by mouth daily as needed.   potassium chloride SA 20 MEQ tablet Commonly known as: KLOR-CON Take 1 tablet (20 mEq total) by mouth daily for 5  days.   PreserVision AREDS 2+Multi Vit Caps Take 1 capsule by mouth 2 (two) times daily.   rosuvastatin 10 MG tablet Commonly known as: CRESTOR Take 1 tablet (10 mg total) by mouth daily. What changed: See the new instructions.   saccharomyces boulardii 250 MG capsule Commonly known as: FLORASTOR Take 1 capsule (250 mg total) by mouth 2 (two) times daily for 10 days.   sitaGLIPtin 100 MG tablet Commonly known as: Januvia TAKE 1 TABLET BY MOUTH DAILY What changed:   how much to take  how to take this  when to take  this  additional instructions   vitamin B-12 500 MCG tablet Commonly known as: CYANOCOBALAMIN Take 1,000 mcg by mouth daily.      Allergies  Allergen Reactions  . Ibuprofen     Lowers WBCs    Contact information for follow-up providers    [facility] Follow up in 4 week(s).   Specialty: Neurology Why: stroke clinic. office will call with appt date and time Contact information: [address] [place], [place], [place]. Call in 1 day(s).   Specialty: Internal Medicine Why: Please call for a post hospital follow-up appointment. Contact information: [address] [place] [address] [phone]        Brantley Fling, MD. Call in 1 day(s).   Specialty: Internal Medicine Why: Please call for a post hospital follow-up appointment. Contact information: [place] [address] [phone]        Sanda Klein, MD. Call in 1 day(s).   Specialty: Cardiology Why: Please follow up with Dr. Sallyanne Kuster for your 30 day monitor to rule out Afib. Contact information: [address] [place] [address] [phone]            Contact information for after-discharge care    Destination    [facility] [place] [facility] .   Service: Skilled Nursing Contact information: [address] [place] [phone]                   The results of significant diagnostics from this hospitalization (including imaging, microbiology, ancillary and laboratory) are listed below for reference.    Significant Diagnostic Studies: CT ANGIO HEAD W OR WO CONTRAST  Result Date: 09/02/2020 CLINICAL DATA:  Stroke/TIA. Assess intracranial and extracranial arteries. EXAM: CT ANGIOGRAPHY HEAD AND NECK TECHNIQUE: Multidetector CT imaging of the head and neck was performed using the standard protocol during bolus administration of intravenous contrast. Multiplanar CT image reconstructions and MIPs were obtained to evaluate the vascular anatomy. Carotid stenosis measurements (when applicable) are obtained utilizing NASCET criteria, using the distal internal carotid diameter as the denominator. CONTRAST:  70m OMNIPAQUE IOHEXOL 350 MG/ML SOLN COMPARISON:  MRI of the brain September 03, 2019. FINDINGS: CT HEAD FINDINGS Brain: No evidence of acute infarction, hemorrhage, hydrocephalus, extra-axial collection or mass lesion/mass effect. Acute small infarcts are better demonstrated on recent MRI of the brain performed earlier today. Vascular: Calcified plaques in the bilateral carotid siphons and bilateral vertebral arteries. Skull: Normal. Negative for fracture or focal lesion. Sinuses: Imaged portions are clear. Orbits: No acute finding. Review of the MIP images confirms the above findings CTA NECK FINDINGS Aortic arch: Standard branching. Imaged portion shows no evidence of aneurysm or dissection. No significant stenosis of the major arch vessel origins. Right carotid system: Increased tortuosity of the innominate and proximal right common carotid artery and distal cervical segment of the right ICA. Mild atherosclerotic changes in the right carotid bifurcation. No evidence of dissection, stenosis (50% or greater) or occlusion. Left carotid system: Mild luminal irregularity cervical  segment of the left ICA, likely related to mild atherosclerotic disease without stenosis. There is also increased tortuosity of the upper cervical segment of the left ICA. No evidence of dissection, stenosis (50% or greater) or occlusion. Vertebral arteries: The left vertebral artery is dominant. No evidence of dissection, stenosis (50% or greater) or occlusion. Skeleton: Degenerative changes of the cervical. No aggressive bone  lesion seen. Other neck: Heterogeneous left thyroid lobe nodule measuring 1.2 cm. Upper chest: Biapical scarring and ground-glass opacities. Review of the MIP images confirms the above findings CTA HEAD FINDINGS Anterior circulation: No significant stenosis, proximal occlusion, aneurysm, or vascular malformation. Posterior circulation: Minutes if caliber of the V4 segment of the right vertebral artery with a dominant left vertebral artery. No significant stenosis, proximal occlusion, aneurysm, or vascular malformation. Venous sinuses: As permitted by contrast timing, patent. Anatomic variants: None. Review of the MIP images confirms the above findings IMPRESSION: 1. No large vessel occlusion, hemodynamically significant stenosis, or evidence of dissection. 2. Heterogeneous left thyroid lobe nodule measuring 1.2 cm. Electronically Signed   By: Pedro Earls M.D.   On: 09/02/2020 18:17   DG Chest 1 View  Result Date: 08/19/2020 CLINICAL DATA:  Weakness with increased cough. Fall today. EXAM: CHEST  1 VIEW COMPARISON:  Radiograph 01/26/2016 FINDINGS: Heart is normal in size. Unchanged mediastinal contours. There is chronic hyperinflation. Bronchial thickening appears chronic but increased from prior exam. Subsegmental opacities in both mid lung zones and left lung base. Chronic biapical pleuroparenchymal scarring. No significant pleural effusion. No pneumothorax. No acute osseous abnormalities are seen. IMPRESSION: 1. Subsegmental opacities in both mid lung zones and left  lung base, favor atelectasis or scarring, however atypical pneumonia could have a similar appearance. 2. Chronic but increased bronchial thickening may represent acute bronchitis. Chronic hyperinflation. Electronically Signed   By: Keith Rake M.D.   On: 08/19/2020 15:23   DG Lumbar Spine 2-3 Views  Result Date: 08/19/2020 CLINICAL DATA:  Weakness and cough. Fall today. EXAM: LUMBAR SPINE - 2-3 VIEW COMPARISON:  Lumbar radiograph 07/16/2018 FINDINGS: Slight dextroscoliotic curvature unchanged from prior exam. No listhesis. The vertebral body heights are preserved. Disc space narrowing and endplate spurring at L9-F7, L4-L5, and L5-S1. There is lower lumbar facet hypertrophy. No acute fracture. The sacroiliac joints are congruent. IMPRESSION: 1. No acute fracture or subluxation of the lumbar spine. 2. Mild scoliosis with stable degenerative change from 2019. Electronically Signed   By: Keith Rake M.D.   On: 08/19/2020 15:25   DG Abd 1 View  Result Date: 08/29/2020 CLINICAL DATA:  Abdominal pain and bloating. EXAM: ABDOMEN - 1 VIEW COMPARISON:  None. FINDINGS: The bowel gas pattern is normal, with large amount of air seen throughout the colon. No radio-opaque calculi or other significant radiographic abnormality are seen. Subcentimeter phleboliths are noted within lower pelvis. IMPRESSION: Negative. Electronically Signed   By: Virgina Norfolk M.D.   On: 08/29/2020 22:54   CT Head Wo Contrast  Result Date: 08/19/2020 CLINICAL DATA:  84 year old female with history of minor head trauma from a fall today. EXAM: CT HEAD WITHOUT CONTRAST CT CERVICAL SPINE WITHOUT CONTRAST TECHNIQUE: Multidetector CT imaging of the head and cervical spine was performed following the standard protocol without intravenous contrast. Multiplanar CT image reconstructions of the cervical spine were also generated. COMPARISON:  None. No priors. FINDINGS: CT HEAD FINDINGS Brain: Mild cerebral atrophy. Patchy and confluent  areas of decreased attenuation are noted throughout the deep and periventricular white matter of the cerebral hemispheres bilaterally, compatible with chronic microvascular ischemic disease. No evidence of acute infarction, hemorrhage, hydrocephalus, extra-axial collection or mass lesion/mass effect. Vascular: No hyperdense vessel or unexpected calcification. Skull: Normal. Negative for fracture or focal lesion. Sinuses/Orbits: No acute finding. Other: None. CT CERVICAL SPINE FINDINGS Alignment: Normal. Skull base and vertebrae: No acute fracture. No primary bone lesion or focal pathologic process. Soft tissues  and spinal canal: No prevertebral fluid or swelling. No visible canal hematoma. Disc levels: Very mild multilevel degenerative disc disease and facet arthropathy. Upper chest: Bilateral apical pleuroparenchymal thickening and architectural distortion, most compatible with areas of chronic post infectious or inflammatory scarring. Other: None. IMPRESSION: 1. No evidence of significant acute traumatic injury to the skull, brain or cervical spine. 2. Mild cerebral atrophy with chronic microvascular ischemic changes in the cerebral white matter. 3. Very mild multilevel degenerative disc disease and facet arthropathy. Electronically Signed   By: Vinnie Langton M.D.   On: 08/19/2020 18:42   CT ANGIO NECK W OR WO CONTRAST  Result Date: 09/02/2020 CLINICAL DATA:  Stroke/TIA. Assess intracranial and extracranial arteries. EXAM: CT ANGIOGRAPHY HEAD AND NECK TECHNIQUE: Multidetector CT imaging of the head and neck was performed using the standard protocol during bolus administration of intravenous contrast. Multiplanar CT image reconstructions and MIPs were obtained to evaluate the vascular anatomy. Carotid stenosis measurements (when applicable) are obtained utilizing NASCET criteria, using the distal internal carotid diameter as the denominator. CONTRAST:  42m OMNIPAQUE IOHEXOL 350 MG/ML SOLN COMPARISON:  MRI  of the brain September 03, 2019. FINDINGS: CT HEAD FINDINGS Brain: No evidence of acute infarction, hemorrhage, hydrocephalus, extra-axial collection or mass lesion/mass effect. Acute small infarcts are better demonstrated on recent MRI of the brain performed earlier today. Vascular: Calcified plaques in the bilateral carotid siphons and bilateral vertebral arteries. Skull: Normal. Negative for fracture or focal lesion. Sinuses: Imaged portions are clear. Orbits: No acute finding. Review of the MIP images confirms the above findings CTA NECK FINDINGS Aortic arch: Standard branching. Imaged portion shows no evidence of aneurysm or dissection. No significant stenosis of the major arch vessel origins. Right carotid system: Increased tortuosity of the innominate and proximal right common carotid artery and distal cervical segment of the right ICA. Mild atherosclerotic changes in the right carotid bifurcation. No evidence of dissection, stenosis (50% or greater) or occlusion. Left carotid system: Mild luminal irregularity cervical segment of the left ICA, likely related to mild atherosclerotic disease without stenosis. There is also increased tortuosity of the upper cervical segment of the left ICA. No evidence of dissection, stenosis (50% or greater) or occlusion. Vertebral arteries: The left vertebral artery is dominant. No evidence of dissection, stenosis (50% or greater) or occlusion. Skeleton: Degenerative changes of the cervical. No aggressive bone lesion seen. Other neck: Heterogeneous left thyroid lobe nodule measuring 1.2 cm. Upper chest: Biapical scarring and ground-glass opacities. Review of the MIP images confirms the above findings CTA HEAD FINDINGS Anterior circulation: No significant stenosis, proximal occlusion, aneurysm, or vascular malformation. Posterior circulation: Minutes if caliber of the V4 segment of the right vertebral artery with a dominant left vertebral artery. No significant stenosis,  proximal occlusion, aneurysm, or vascular malformation. Venous sinuses: As permitted by contrast timing, patent. Anatomic variants: None. Review of the MIP images confirms the above findings IMPRESSION: 1. No large vessel occlusion, hemodynamically significant stenosis, or evidence of dissection. 2. Heterogeneous left thyroid lobe nodule measuring 1.2 cm. Electronically Signed   By: KPedro EarlsM.D.   On: 09/02/2020 18:17   CT Cervical Spine Wo Contrast  Result Date: 08/19/2020 CLINICAL DATA:  84year old female with history of minor head trauma from a fall today. EXAM: CT HEAD WITHOUT CONTRAST CT CERVICAL SPINE WITHOUT CONTRAST TECHNIQUE: Multidetector CT imaging of the head and cervical spine was performed following the standard protocol without intravenous contrast. Multiplanar CT image reconstructions of the cervical spine were also  generated. COMPARISON:  None. No priors. FINDINGS: CT HEAD FINDINGS Brain: Mild cerebral atrophy. Patchy and confluent areas of decreased attenuation are noted throughout the deep and periventricular white matter of the cerebral hemispheres bilaterally, compatible with chronic microvascular ischemic disease. No evidence of acute infarction, hemorrhage, hydrocephalus, extra-axial collection or mass lesion/mass effect. Vascular: No hyperdense vessel or unexpected calcification. Skull: Normal. Negative for fracture or focal lesion. Sinuses/Orbits: No acute finding. Other: None. CT CERVICAL SPINE FINDINGS Alignment: Normal. Skull base and vertebrae: No acute fracture. No primary bone lesion or focal pathologic process. Soft tissues and spinal canal: No prevertebral fluid or swelling. No visible canal hematoma. Disc levels: Very mild multilevel degenerative disc disease and facet arthropathy. Upper chest: Bilateral apical pleuroparenchymal thickening and architectural distortion, most compatible with areas of chronic post infectious or inflammatory scarring. Other:  None. IMPRESSION: 1. No evidence of significant acute traumatic injury to the skull, brain or cervical spine. 2. Mild cerebral atrophy with chronic microvascular ischemic changes in the cerebral white matter. 3. Very mild multilevel degenerative disc disease and facet arthropathy. Electronically Signed   By: Vinnie Langton M.D.   On: 08/19/2020 18:42   MR ANGIO HEAD WO CONTRAST  Result Date: 08/28/2020 CLINICAL DATA:  Stroke follow-up. Acute right basal ganglia infarct on earlier MRI. EXAM: MRA HEAD WITHOUT CONTRAST TECHNIQUE: Angiographic images of the Circle of Willis were obtained using MRA technique without intravenous contrast. COMPARISON:  None. FINDINGS: The visualized distal vertebral arteries are widely patent to the basilar with the left being strongly dominant. Patent AICA and SCA origins are identified bilaterally. The basilar artery is widely patent. Posterior communicating arteries are not identified and may be diminutive or absent. The PCAs are patent without evidence of a significant proximal stenosis. There are distal cervical internal carotid artery loops bilaterally. The intracranial internal carotid arteries are patent without evidence of significant stenosis allowing for motion artifact through the anterior genu of the left ICA. ACAs and MCAs are patent without evidence of a proximal branch occlusion or significant M1 stenosis. Assessment for branch vessel stenosis is limited by motion. There is a moderate stenosis versus motion artifact in the distal left A1 segment. No aneurysm is identified. IMPRESSION: 1. No large vessel occlusion. 2. Moderate left A1 stenosis versus motion artifact. Electronically Signed   By: Logan Bores M.D.   On: 08/28/2020 03:11   MR BRAIN WO CONTRAST  Result Date: 09/02/2020 CLINICAL DATA:  Myelodysplastic syndrome. Acute presentation with confusion. Weakness. EXAM: MRI HEAD WITHOUT CONTRAST TECHNIQUE: Multiplanar, multiecho pulse sequences of the brain  and surrounding structures were obtained without intravenous contrast. COMPARISON:  08/28/2020 FINDINGS: Brain: Subcentimeter acute infarction within the putamen on the right is unchanged since the prior exam. No new supratentorial finding. Newly seen today are 5-7 punctate foci of acute infarctions scattered within the inferior cerebellum on both sides. No large confluent infarction. Elsewhere, chronic small-vessel ischemic changes are seen within the deep and subcortical white matter of both cerebral hemispheres. No large vessel territory infarction. No mass, hemorrhage, hydrocephalus or extra-axial collection. Vascular: Major vessels at the base of the brain show flow. Skull and upper cervical spine: Negative Sinuses/Orbits: Clear/normal Other: None IMPRESSION: 1. Unchanged subcentimeter acute infarction within the putamen on the right. 2. Newly seen 5-7 punctate foci of acute infarctions scattered within the inferior cerebellum on both sides. No large confluent infarction. Being a different vascular territory, this suggests embolic disease from the heart or ascending aorta. 3. Chronic small-vessel ischemic changes elsewhere throughout the  brain as outlined above. 4. Findings discussed with hospitalist physician at the time of interpretation. Electronically Signed   By: Nelson Chimes M.D.   On: 09/02/2020 14:31   MR BRAIN WO CONTRAST  Result Date: 08/28/2020 CLINICAL DATA:  Dizziness.  Memory loss. EXAM: MRI HEAD WITHOUT CONTRAST TECHNIQUE: Multiplanar, multiecho pulse sequences of the brain and surrounding structures were obtained without intravenous contrast. COMPARISON:  Head CT 08/19/2020 FINDINGS: Brain: There is a 4 mm acute infarct at the anteroinferior aspect of the right basal ganglia. Small T2 hyperintensities scattered throughout the cerebral white matter bilaterally are nonspecific but compatible with mild chronic small vessel ischemic disease. Two small adjacent chronic hemorrhages are noted in  the subcortical white matter of the left frontal lobe. Mild cerebral atrophy is within normal limits for age. Vascular: Major intracranial vascular flow voids are preserved. Skull and upper cervical spine: Unremarkable bone marrow signal. Sinuses/Orbits: Bilateral cataract extraction. Paranasal sinuses and mastoid air cells are clear. Other: None. IMPRESSION: 1. Acute right basal ganglia lacunar infarct. 2. Mild chronic small vessel ischemic disease. Electronically Signed   By: Logan Bores M.D.   On: 08/28/2020 01:18   CT ABDOMEN PELVIS W CONTRAST  Result Date: 08/19/2020 CLINICAL DATA:  Right lower quadrant abdominal pain. Fall at home. Dizziness. EXAM: CT ABDOMEN AND PELVIS WITH CONTRAST TECHNIQUE: Multidetector CT imaging of the abdomen and pelvis was performed using the standard protocol following bolus administration of intravenous contrast. CONTRAST:  180m OMNIPAQUE IOHEXOL 300 MG/ML  SOLN COMPARISON:  None. FINDINGS: Lower chest: Right middle lobe bronchiectasis with distal mucous plugging. Reticulonodular opacities in the lingula. Subsegmental opacities in the left lower lobe likely atelectasis. No pleural fluid. No pericardial fluid. No basilar rib fracture. Hepatobiliary: Elongated right lobe of the liver. No focal hepatic abnormality. No evidence of perihepatic hematoma or hepatic injury. Distended gallbladder without pericholecystic inflammation or calcified gallstone. No biliary dilatation. Pancreas: No ductal dilatation or inflammation. Spleen: Subcentimeter hypodensity in the anterior spleen is nonspecific, typically benign. No splenomegaly. No splenic injury or perisplenic hematoma. Adrenals/Urinary Tract: No renal or adrenal injury. There is a 14 mm left adrenal nodule. Lobulated bilateral renal contours. Bilateral cortical cysts. Symmetric prominence of both renal pelvis likely extrarenal pelvis configuration. No perinephric edema. No renal calculi. The urinary bladder is distended. No  bladder wall thickening or injury. Stomach/Bowel: Tiny hiatal hernia. The stomach is nondistended. No bowel obstruction, inflammation, or evidence of injury. Sigmoid colon is redundant. Small volume of colonic stool. The appendix is not confidently visualized. Vascular/Lymphatic: Aortic atherosclerosis. Patent portal vein. No acute vascular findings or evidence of injury. No adenopathy. Reproductive: Status post hysterectomy. No adnexal masses. Other: No free air or free fluid. Postsurgical changes the anterior abdominal wall. No body wall hernia. Musculoskeletal: No acute fracture of the pelvis, lumbar spine, or included lower ribs. Multilevel degenerative change with vacuum phenomena and facet hypertrophy in the lumbar spine. IMPRESSION: 1. The appendix is not visualized, there is no evidence of appendicitis. 2. No acute traumatic injury to the abdomen or pelvis. 3. Distended gallbladder without pericholecystic inflammation or calcified gallstone. If there is clinical concern for acute gallbladder pathology, recommend right upper quadrant ultrasound. 4. Right middle lobe bronchiectasis with distal mucous plugging. Reticulonodular opacities in the lingula. Findings suggest prior mycobacterium avium infection. 5. Left adrenal nodule measuring 14 mm, nonspecific. In the absence of known malignancy, this is likely benign. Aortic Atherosclerosis (ICD10-I70.0). Electronically Signed   By: MKeith RakeM.D.   On: 08/19/2020 18:43  DG Pelvis Portable  Result Date: 08/19/2020 CLINICAL DATA:  Weakness. Fall today. EXAM: PORTABLE PELVIS 1-2 VIEWS COMPARISON:  Pelvis and right hip radiograph on 07/16/2018 FINDINGS: The cortical margins of the bony pelvis are intact. No fracture. Pubic symphysis and sacroiliac joints are congruent. Bilateral acetabular spurring. Both femoral heads are well-seated in the respective acetabula. IMPRESSION: No pelvic fracture. Electronically Signed   By: Keith Rake M.D.   On:  08/19/2020 15:26   DG CHEST PORT 1 VIEW  Result Date: 08/28/2020 CLINICAL DATA:  Fever EXAM: PORTABLE CHEST 1 VIEW COMPARISON:  08/19/2020 FINDINGS: Borderline heart size. Blunting at the lateral left costophrenic sulcus that is stable and most likely from mediastinal fat. Reticulation of lung markings. There is signs of chronic indolent infection/scarring in the lower lungs on recent abdominal CT. There is no edema, consolidation, effusion, or pneumothorax. IMPRESSION: Stable from prior.  No acute finding. Electronically Signed   By: Monte Fantasia M.D.   On: 08/28/2020 07:05   EEG adult  Result Date: 08/28/2020 Lora Havens, MD     08/28/2020 12:15 PM Patient Name: Arzu Mcgaughey MRN: [id number] Epilepsy Attending: Lora Havens Referring Physician/Provider: Dr Antony Contras Date: 08/28/2020 Duration: 24.43 mins Patient history: 84yo F with R basal ganglia infarct and cognitive impairment. EEG to evaluate for seizure. Level of alertness: Awake AEDs during EEG study: None Technical aspects: This EEG study was done with scalp electrodes positioned according to the 10-20 International system of electrode placement. Electrical activity was acquired at a sampling rate of _0  and reviewed with a high frequency filter of _1  and a low frequency filter of _2 . EEG data were recorded continuously and digitally stored. Description: The posterior dominant rhythm consists of 8 Hz activity of moderate voltage (25-35 uV) seen predominantly in posterior head regions, symmetric and reactive to eye opening and eye closing. EEG showed intermittent generalized 3 to 6 Hz theta-delta slowing.  Hyperventilation and photic stimulation were not performed.   ABNORMALITY -Intermittent slow, generalized IMPRESSION: This study is suggestive of mild diffuse encephalopathy, nonspecific etiology. No seizures or epileptiform discharges were seen throughout the recording. Lora Havens   ECHOCARDIOGRAM  COMPLETE  Result Date: 08/28/2020    ECHOCARDIOGRAM REPORT   Patient Name:   Benson Setting Date of Exam: 08/28/2020 Medical Rec #:  [id number]              Height:       61.0 in Accession #:    [id number]             Weight:       159.0 lb Date of Birth:  [birth year]              BSA:          1.713 m Patient Age:    70 years               BP:           118/49 mmHg Patient Gender: F                      HR:           88 bpm. Exam Location:  Inpatient Procedure: 2D Echo, Cardiac Doppler and Color Doppler Indications:    Stroke 434.91 / I163.9  History:        Patient has no prior history of Echocardiogram examinations.  Risk Factors:Hypertension and Diabetes.  Sonographer:    Bernadene Person RDCS Referring Phys: [place]  1. Left ventricular ejection fraction, by estimation, is >75%. The left ventricle has normal function. The left ventricle has no regional wall motion abnormalities. Left ventricular diastolic parameters are consistent with Grade I diastolic dysfunction (impaired relaxation).  2. Right ventricular systolic function is normal. The right ventricular size is normal. There is mildly elevated pulmonary artery systolic pressure.  3. The mitral valve is normal in structure. No evidence of mitral valve regurgitation. No evidence of mitral stenosis.  4. The aortic valve is tricuspid. There is mild calcification of the aortic valve. There is mild thickening of the aortic valve. Aortic valve regurgitation is not visualized. No aortic stenosis is present.  5. The inferior vena cava is normal in size with greater than 50% respiratory variability, suggesting right atrial pressure of 3 mmHg. FINDINGS  Left Ventricle: Left ventricular ejection fraction, by estimation, is >75%. The left ventricle has normal function. The left ventricle has no regional wall motion abnormalities. The left ventricular internal cavity size was normal in size. There is no left  ventricular hypertrophy. Left ventricular diastolic parameters are consistent with Grade I diastolic dysfunction (impaired relaxation). Normal left ventricular filling pressure. Right Ventricle: The right ventricular size is normal. No increase in right ventricular wall thickness. Right ventricular systolic function is normal. There is mildly elevated pulmonary artery systolic pressure. The tricuspid regurgitant velocity is 2.96  m/s, and with an assumed right atrial pressure of 3 mmHg, the estimated right ventricular systolic pressure is 93.5 mmHg. Left Atrium: Left atrial size was normal in size. Right Atrium: Right atrial size was normal in size. Pericardium: There is no evidence of pericardial effusion. Mitral Valve: The mitral valve is normal in structure. No evidence of mitral valve regurgitation. No evidence of mitral valve stenosis. Tricuspid Valve: The tricuspid valve is normal in structure. Tricuspid valve regurgitation is mild . No evidence of tricuspid stenosis. Aortic Valve: The aortic valve is tricuspid. There is mild calcification of the aortic valve. There is mild thickening of the aortic valve. There is mild aortic valve annular calcification. Aortic valve regurgitation is not visualized. No aortic stenosis  is present. Aortic valve mean gradient measures 5.4 mmHg. Aortic valve peak gradient measures 11.3 mmHg. Aortic valve area, by VTI measures 2.52 cm. Pulmonic Valve: The pulmonic valve was not well visualized. Pulmonic valve regurgitation is not visualized. No evidence of pulmonic stenosis. Aorta: The aortic root is normal in size and structure. Pulmonary Artery: 35. Venous: The inferior vena cava is normal in size with greater than 50% respiratory variability, suggesting right atrial pressure of 3 mmHg. IAS/Shunts: No atrial level shunt detected by color flow Doppler.  LEFT VENTRICLE PLAX 2D LVIDd:         4.00 cm  Diastology LVIDs:         1.90 cm  LV e' medial:    7.34 cm/s LV PW:          1.00 cm  LV E/e' medial:  11.9 LV IVS:        1.00 cm  LV e' lateral:   6.09 cm/s LVOT diam:     2.10 cm  LV E/e' lateral: 14.4 LV SV:         72 LV SV Index:   42 LVOT Area:     3.46 cm  RIGHT VENTRICLE RV S prime:     18.10 cm/s TAPSE (M-mode): 1.9 cm  LEFT ATRIUM             Index       RIGHT ATRIUM           Index LA diam:        2.60 cm 1.52 cm/m  RA Area:     12.40 cm LA Vol (A2C):   32.6 ml 19.03 ml/m RA Volume:   28.30 ml  16.52 ml/m LA Vol (A4C):   42.1 ml 24.57 ml/m LA Biplane Vol: 36.8 ml 21.48 ml/m  AORTIC VALVE AV Area (Vmax):    2.61 cm AV Area (Vmean):   2.72 cm AV Area (VTI):     2.52 cm AV Vmax:           168.33 cm/s AV Vmean:          109.711 cm/s AV VTI:            0.286 m AV Peak Grad:      11.3 mmHg AV Mean Grad:      5.4 mmHg LVOT Vmax:         127.00 cm/s LVOT Vmean:        86.300 cm/s LVOT VTI:          0.208 m LVOT/AV VTI ratio: 0.73  AORTA Ao Root diam: 3.00 cm Ao Asc diam:  3.00 cm MITRAL VALVE                TRICUSPID VALVE MV Area (PHT): 3.77 cm     TR Peak grad:   35.0 mmHg MV Decel Time: 201 msec     TR Vmax:        296.00 cm/s MV E velocity: 87.50 cm/s MV A velocity: 158.00 cm/s  SHUNTS MV E/A ratio:  0.55         Systemic VTI:  0.21 m                             Systemic Diam: 2.10 cm Carlyle Dolly MD Electronically signed by Carlyle Dolly MD Signature Date/Time: 08/28/2020/8:53:08 AM    Final    CT RENAL STONE STUDY  Result Date: 08/28/2020 CLINICAL DATA:  Flank pain and fevers EXAM: CT ABDOMEN AND PELVIS WITHOUT CONTRAST TECHNIQUE: Multidetector CT imaging of the abdomen and pelvis was performed following the standard protocol without IV contrast. COMPARISON:  08/19/2020 FINDINGS: Lower chest: Lung bases again demonstrates some bronchiectatic changes in the right middle lobe and lingula similar to that seen on prior study. No new focal infiltrate is seen. Hepatobiliary: No focal liver abnormality is seen. No gallstones, gallbladder wall thickening, or biliary  dilatation. Pancreas: Unremarkable. No pancreatic ductal dilatation or surrounding inflammatory changes. Spleen: Normal in size without focal abnormality. Adrenals/Urinary Tract: Adrenal glands are within normal limits. Kidneys are well visualized bilaterally. No renal calculi or obstructive changes are seen. Small cyst is noted in the upper pole of the right kidney. The bladder is well distended. Stomach/Bowel: The appendix is not well visualized and may have been surgically removed. No obstructive or inflammatory changes of the large or small bowel are seen. Stomach is decompressed. Vascular/Lymphatic: Aortic atherosclerosis. No enlarged abdominal or pelvic lymph nodes. Reproductive: Status post hysterectomy. No adnexal masses. Other: No abdominal wall hernia or abnormality. No abdominopelvic ascites. Musculoskeletal: No acute or significant osseous findings. IMPRESSION: Changes similar to that seen on prior exam. No acute abnormality is noted. Electronically Signed   By: Linus Mako.D.  On: 08/28/2020 09:15   VAS US CAROTID (at [facility] and [facility] only)  Result Date: 08/29/2020 Carotid Arterial Duplex Study Indications:  Recent fall with progressive decline of mental status. Risk Factors: Hypertension, hyperlipidemia, Diabetes. Performing Technologist: Rogelia Rohrer  Examination Guidelines: A complete evaluation includes B-mode imaging, spectral Doppler, color Doppler, and power Doppler as needed of all accessible portions of each vessel. Bilateral testing is considered an integral part of a complete examination. Limited examinations for reoccurring indications may be performed as noted.  Right Carotid Findings: +----------+--------+--------+--------+------------------+--------+           PSV cm/sEDV cm/sStenosisPlaque DescriptionComments +----------+--------+--------+--------+------------------+--------+ CCA Prox  97      5                                           +----------+--------+--------+--------+------------------+--------+ CCA Distal122     28                                         +----------+--------+--------+--------+------------------+--------+ ICA Prox  87      23      1-39%                              +----------+--------+--------+--------+------------------+--------+ ECA       112     0                                          +----------+--------+--------+--------+------------------+--------+ +----------+--------+-------+----------------+-------------------+           PSV cm/sEDV cmsDescribe        Arm Pressure (mmHG) +----------+--------+-------+----------------+-------------------+ [id number]     0      Multiphasic, WNL                    +----------+--------+-------+----------------+-------------------+ +---------+--------+--+--------+--+---------+ VertebralPSV cm/s58EDV cm/s10Antegrade +---------+--------+--+--------+--+---------+  Left Carotid Findings: +----------+--------+--------+--------+------------------+--------+           PSV cm/sEDV cm/sStenosisPlaque DescriptionComments +----------+--------+--------+--------+------------------+--------+ CCA Prox  104     0                                          +----------+--------+--------+--------+------------------+--------+ CCA Distal100     12                                         +----------+--------+--------+--------+------------------+--------+ ICA Prox  53      12      1-39%                              +----------+--------+--------+--------+------------------+--------+ ICA Distal77      17                                         +----------+--------+--------+--------+------------------+--------+ ECA       63      0                                          +----------+--------+--------+--------+------------------+--------+ +----------+--------+--------+----------------+-------------------+  PSV cm/sEDV  cm/sDescribe        Arm Pressure (mmHG) +----------+--------+--------+----------------+-------------------+ [id number]      0       Multiphasic, WNL                    +----------+--------+--------+----------------+-------------------+ +---------+--------+--+--------+--+---------+ VertebralPSV cm/s65EDV cm/s17Antegrade +---------+--------+--+--------+--+---------+   Summary: Right Carotid: Velocities in the right ICA are consistent with a 1-39% stenosis. Left Carotid: Velocities in the left ICA are consistent with a 1-39% stenosis. Vertebrals:  Bilateral vertebral arteries demonstrate antegrade flow. Subclavians: Normal flow hemodynamics were seen in bilateral subclavian              arteries. *See table(s) above for measurements and observations.  Electronically signed by Servando Snare MD on 08/29/2020 at 7:59:00 AM.    Final    VAS [place] LOWER EXTREMITY VENOUS (DVT)  Result Date: 09/03/2020  Lower Venous DVT Study Other Indications: CVA (cryptogenic). Performing Technologist: Rogelia Rohrer  Examination Guidelines: A complete evaluation includes B-mode imaging, spectral Doppler, color Doppler, and power Doppler as needed of all accessible portions of each vessel. Bilateral testing is considered an integral part of a complete examination. Limited examinations for reoccurring indications may be performed as noted. The reflux portion of the exam is performed with the patient in reverse Trendelenburg.  +---------+---------------+---------+-----------+----------+--------------+ RIGHT    CompressibilityPhasicitySpontaneityPropertiesThrombus Aging +---------+---------------+---------+-----------+----------+--------------+ CFV      Full           Yes      Yes                                 +---------+---------------+---------+-----------+----------+--------------+ SFJ      Full                                                         +---------+---------------+---------+-----------+----------+--------------+ FV Prox  Full           Yes      Yes                                 +---------+---------------+---------+-----------+----------+--------------+ FV Mid   Full           Yes      Yes                                 +---------+---------------+---------+-----------+----------+--------------+ FV DistalFull           Yes      Yes                                 +---------+---------------+---------+-----------+----------+--------------+ PFV      Full                                                        +---------+---------------+---------+-----------+----------+--------------+ POP      Full           Yes  Yes                                 +---------+---------------+---------+-----------+----------+--------------+ PTV      Full                                                        +---------+---------------+---------+-----------+----------+--------------+ PERO     Full                                                        +---------+---------------+---------+-----------+----------+--------------+   +---------+---------------+---------+-----------+----------+--------------+ LEFT     CompressibilityPhasicitySpontaneityPropertiesThrombus Aging +---------+---------------+---------+-----------+----------+--------------+ CFV      Full           Yes      Yes                                 +---------+---------------+---------+-----------+----------+--------------+ SFJ      Full                                                        +---------+---------------+---------+-----------+----------+--------------+ FV Prox  Full           Yes      Yes                                 +---------+---------------+---------+-----------+----------+--------------+ FV Mid   Full           Yes      Yes                                  +---------+---------------+---------+-----------+----------+--------------+ FV DistalFull           Yes      Yes                                 +---------+---------------+---------+-----------+----------+--------------+ PFV      Full                                                        +---------+---------------+---------+-----------+----------+--------------+ POP      Full           Yes      Yes                                 +---------+---------------+---------+-----------+----------+--------------+ PTV      Full                                                        +---------+---------------+---------+-----------+----------+--------------+  PERO     Full                                                        +---------+---------------+---------+-----------+----------+--------------+     Summary: RIGHT: - There is no evidence of deep vein thrombosis in the lower extremity.  - No cystic structure found in the popliteal fossa.  LEFT: - There is no evidence of deep vein thrombosis in the lower extremity.  - No cystic structure found in the popliteal fossa.  *See table(s) above for measurements and observations. Electronically signed by Servando Snare MD on 09/03/2020 at 8:20:27 PM.    Final    US Abdomen Limited RUQ (LIVER/GB)  Result Date: 08/28/2020 CLINICAL DATA:  Nausea and vomiting. EXAM: ULTRASOUND ABDOMEN LIMITED RIGHT UPPER QUADRANT COMPARISON:  CT scan 08/19/2020 FINDINGS: Gallbladder: No gallstones or gallbladder wall thickening. No pericholecystic fluid. The sonographer reports no sonographic Murphy's sign. Common bile duct: Diameter: 1-2 mm Liver: No focal lesion identified. Within normal limits in parenchymal echogenicity. Portal vein is patent on color Doppler imaging with normal direction of blood flow towards the liver. Other: None. IMPRESSION: No acute findings. No evidence to explain the patient's history of nausea and vomiting. Electronically Signed   By:  Misty Stanley M.D.   On: 08/28/2020 08:04    Microbiology: Recent Results (from the past 240 hour(s))  Culture, Urine     Status: Abnormal   Collection Time: 08/28/2020  6:56 PM   Specimen: Urine, Random  Result Value Ref Range Status   Specimen Description URINE, RANDOM  Final   Special Requests   Final    NONE Performed at [facility], [address], [place], [place] [address]    Culture MULTIPLE SPECIES PRESENT, SUGGEST RECOLLECTION (A)  Final   Report Status 08/29/2020 FINAL  Final  Resp Panel by RT-PCR (Flu A&B, Covid) Nasopharyngeal Swab     Status: None   Collection Time: 08/10/2020 10:15 PM   Specimen: Nasopharyngeal Swab; Nasopharyngeal(NP) swabs in vial transport medium  Result Value Ref Range Status   SARS Coronavirus 2 by RT PCR NEGATIVE NEGATIVE Final    Comment: (NOTE) SARS-CoV-2 target nucleic acids are NOT DETECTED.  The SARS-CoV-2 RNA is generally detectable in upper respiratory specimens during the acute phase of infection. The lowest concentration of SARS-CoV-2 viral copies this assay can detect is 138 copies/mL. A negative result does not preclude SARS-Cov-2 infection and should not be used as the sole basis for treatment or other patient management decisions. A negative result may occur with  improper specimen collection/handling, submission of specimen other than nasopharyngeal swab, presence of viral mutation(s) within the areas targeted by this assay, and inadequate number of viral copies(<138 copies/mL). A negative result must be combined with clinical observations, patient history, and epidemiological information. The expected result is Negative.  Fact Sheet for Patients:  EntrepreneurPulse.com.au  Fact Sheet for Healthcare Providers:  IncredibleEmployment.be  This test is no t yet approved or cleared by the [place] FDA and  has been authorized for detection and/or diagnosis of SARS-CoV-2 by FDA under  an Emergency Use Authorization (EUA). This EUA will remain  in effect (meaning this test can be used) for the duration of the COVID-19 declaration under Section 564(b)(1) of the Act, 21 U.S.C.section 360bbb-3(b)(1), unless the authorization is terminated  or revoked sooner.       Influenza A by PCR NEGATIVE NEGATIVE Final   Influenza B by PCR NEGATIVE NEGATIVE Final    Comment: (NOTE) The Xpert Xpress SARS-CoV-2/FLU/RSV plus assay is intended as an aid in the diagnosis of influenza from Nasopharyngeal swab specimens and should not be used as a sole basis for treatment. Nasal washings and aspirates are unacceptable for Xpert Xpress SARS-CoV-2/FLU/RSV testing.  Fact Sheet for Patients: EntrepreneurPulse.com.au  Fact Sheet for Healthcare Providers: IncredibleEmployment.be  This test is not yet approved or cleared by the [place] FDA and has been authorized for detection and/or diagnosis of SARS-CoV-2 by FDA under an Emergency Use Authorization (EUA). This EUA will remain in effect (meaning this test can be used) for the duration of the COVID-19 declaration under Section 564(b)(1) of the Act, 21 U.S.C. section 360bbb-3(b)(1), unless the authorization is terminated or revoked.  Performed at [facility], [place] [address] [place], [place] [address]   Culture, blood (routine x 2)     Status: None   Collection Time: 08/28/20  4:26 AM   Specimen: BLOOD RIGHT HAND  Result Value Ref Range Status   Specimen Description BLOOD RIGHT HAND  Final   Special Requests   Final    BOTTLES DRAWN AEROBIC AND ANAEROBIC Blood Culture adequate volume   Culture   Final    NO GROWTH 5 DAYS Performed at [facility], [place] [address], [place], [place] [address]    Report Status 09/02/2020 FINAL  Final  Culture, blood (routine x 2)     Status: None   Collection Time: 08/28/20  4:35 AM   Specimen: BLOOD LEFT WRIST  Result Value Ref Range Status    Specimen Description BLOOD LEFT WRIST  Final   Special Requests   Final    BOTTLES DRAWN AEROBIC AND ANAEROBIC Blood Culture adequate volume   Culture   Final    NO GROWTH 5 DAYS Performed at [facility], [place] [address], [place], [place] [address]    Report Status 09/02/2020 FINAL  Final  Culture, Urine     Status: None   Collection Time: 08/29/20  7:35 AM   Specimen: Urine, Random  Result Value Ref Range Status   Specimen Description URINE, RANDOM  Final   Special Requests NONE  Final   Culture   Final    NO GROWTH Performed at [facility], [place] [address], [place], [place] [address]    Report Status 08/30/2020 FINAL  Final  Resp Panel by RT-PCR (Flu A&B, Covid) Nasopharyngeal Swab     Status: None   Collection Time: 08/31/20 12:10 PM   Specimen: Nasopharyngeal Swab; Nasopharyngeal(NP) swabs in vial transport medium  Result Value Ref Range Status   SARS Coronavirus 2 by RT PCR NEGATIVE NEGATIVE Final    Comment: (NOTE) SARS-CoV-2 target nucleic acids are NOT DETECTED.  The SARS-CoV-2 RNA is generally detectable in upper respiratory specimens during the acute phase of infection. The lowest concentration of SARS-CoV-2 viral copies this assay can detect is 138 copies/mL. A negative result does not preclude SARS-Cov-2 infection and should not be used as the sole basis for treatment or other patient management decisions. A negative result may occur with  improper specimen collection/handling, submission of specimen other than nasopharyngeal swab, presence of viral mutation(s) within the areas targeted by this assay, and inadequate number of viral copies(<138 copies/mL). A negative result must be combined with clinical observations, patient history, and  epidemiological information. The expected result is Negative.  Fact Sheet for Patients:  EntrepreneurPulse.com.au  Fact Sheet for Healthcare Providers:   IncredibleEmployment.be  This test is no t yet approved or cleared by the [place] FDA and  has been authorized for detection and/or diagnosis of SARS-CoV-2 by FDA under an Emergency Use Authorization (EUA). This EUA will remain  in effect (meaning this test can be used) for the duration of the COVID-19 declaration under Section 564(b)(1) of the Act, 21 U.S.C.section 360bbb-3(b)(1), unless the authorization is terminated  or revoked sooner.       Influenza A by PCR NEGATIVE NEGATIVE Final   Influenza B by PCR NEGATIVE NEGATIVE Final    Comment: (NOTE) The Xpert Xpress SARS-CoV-2/FLU/RSV plus assay is intended as an aid in the diagnosis of influenza from Nasopharyngeal swab specimens and should not be used as a sole basis for treatment. Nasal washings and aspirates are unacceptable for Xpert Xpress SARS-CoV-2/FLU/RSV testing.  Fact Sheet for Patients: EntrepreneurPulse.com.au  Fact Sheet for Healthcare Providers: IncredibleEmployment.be  This test is not yet approved or cleared by the [place] FDA and has been authorized for detection and/or diagnosis of SARS-CoV-2 by FDA under an Emergency Use Authorization (EUA). This EUA will remain in effect (meaning this test can be used) for the duration of the COVID-19 declaration under Section 564(b)(1) of the Act, 21 U.S.C. section 360bbb-3(b)(1), unless the authorization is terminated or revoked.  Performed at [facility], [place] [address] [place], [place] [address]   SARS CORONAVIRUS 2 (TAT 6-24 HRS) Nasopharyngeal Nasopharyngeal Swab     Status: None   Collection Time: 09/01/20  4:38 PM   Specimen: Nasopharyngeal Swab  Result Value Ref Range Status   SARS Coronavirus 2 NEGATIVE NEGATIVE Final    Comment: (NOTE) SARS-CoV-2 target nucleic acids are NOT DETECTED.  The SARS-CoV-2 RNA is generally detectable in upper and lower respiratory specimens during the  acute phase of infection. Negative results do not preclude SARS-CoV-2 infection, do not rule out co-infections with other pathogens, and should not be used as the sole basis for treatment or other patient management decisions. Negative results must be combined with clinical observations, patient history, and epidemiological information. The expected result is Negative.  Fact Sheet for Patients: SugarRoll.be  Fact Sheet for Healthcare Providers: https://www.woods-mathews.com/  This test is not yet approved or cleared by the [place] FDA and  has been authorized for detection and/or diagnosis of SARS-CoV-2 by FDA under an Emergency Use Authorization (EUA). This EUA will remain  in effect (meaning this test can be used) for the duration of the COVID-19 declaration under Se ction 564(b)(1) of the Act, 21 U.S.C. section 360bbb-3(b)(1), unless the authorization is terminated or revoked sooner.  Performed at [facility], [place] [address] [place], [place] [address]   Resp Panel by RT-PCR (Flu A&B, Covid) Nasopharyngeal Swab     Status: None   Collection Time: 09/04/20 10:25 AM   Specimen: Nasopharyngeal Swab; Nasopharyngeal(NP) swabs in vial transport medium  Result Value Ref Range Status   SARS Coronavirus 2 by RT PCR NEGATIVE NEGATIVE Final    Comment: (NOTE) SARS-CoV-2 target nucleic acids are NOT DETECTED.  The SARS-CoV-2 RNA is generally detectable in upper respiratory specimens during the acute phase of infection. The lowest concentration of SARS-CoV-2 viral copies this assay can detect is 138 copies/mL. A negative result does not preclude SARS-Cov-2 infection and should not be used as the sole basis for treatment or other patient  management decisions. A negative result may occur with  improper specimen collection/handling, submission of specimen other than nasopharyngeal swab, presence of viral mutation(s) within the areas  targeted by this assay, and inadequate number of viral copies(<138 copies/mL). A negative result must be combined with clinical observations, patient history, and epidemiological information. The expected result is Negative.  Fact Sheet for Patients:  EntrepreneurPulse.com.au  Fact Sheet for Healthcare Providers:  IncredibleEmployment.be  This test is no t yet approved or cleared by the [place] FDA and  has been authorized for detection and/or diagnosis of SARS-CoV-2 by FDA under an Emergency Use Authorization (EUA). This EUA will remain  in effect (meaning this test can be used) for the duration of the COVID-19 declaration under Section 564(b)(1) of the Act, 21 U.S.C.section 360bbb-3(b)(1), unless the authorization is terminated  or revoked sooner.       Influenza A by PCR NEGATIVE NEGATIVE Final   Influenza B by PCR NEGATIVE NEGATIVE Final    Comment: (NOTE) The Xpert Xpress SARS-CoV-2/FLU/RSV plus assay is intended as an aid in the diagnosis of influenza from Nasopharyngeal swab specimens and should not be used as a sole basis for treatment. Nasal washings and aspirates are unacceptable for Xpert Xpress SARS-CoV-2/FLU/RSV testing.  Fact Sheet for Patients: EntrepreneurPulse.com.au  Fact Sheet for Healthcare Providers: IncredibleEmployment.be  This test is not yet approved or cleared by the [place] FDA and has been authorized for detection and/or diagnosis of SARS-CoV-2 by FDA under an Emergency Use Authorization (EUA). This EUA will remain in effect (meaning this test can be used) for the duration of the COVID-19 declaration under Section 564(b)(1) of the Act, 21 U.S.C. section 360bbb-3(b)(1), unless the authorization is terminated or revoked.  Performed at [facility], [place] [address], [place], [place] [address]      Labs: Basic Metabolic Panel: Recent Labs  Lab  08/29/20 0324 08/30/20 0225 08/31/20 0246 09/02/20 0217 09/03/20 1017 09/04/20 0438  NA 133* 132* 133* 132* 132* 135  K 3.1* 3.8 3.5 2.9* 3.6 3.7  CL 101 101 99 96* 98 99  CO2 21* _0 GLUCOSE 141* 143* 165* 152* 212* 162*  BUN 9 7* _1 CREATININE 0.76 0.64 0.64 0.61 0.57 0.66  CALCIUM 8.2* 8.5* 8.7* 8.7* 8.4* 8.7*  MG 1.6*  --   --   --  1.8  --    Liver Function Tests: Recent Labs  Lab 09/04/20 0438  AST 9*  ALT 13  ALKPHOS 65  BILITOT 0.5  PROT 5.4*  ALBUMIN 2.8*   No results for input(s): LIPASE, AMYLASE in the last 168 hours. No results for input(s): AMMONIA in the last 168 hours. CBC: Recent Labs  Lab 08/30/20 0225 08/31/20 0246 09/02/20 0217 09/03/20 1017 09/04/20 0438  WBC 2.5* 2.3* 1.9* 1.7* 1.8*  NEUTROABS  --   --   --  1.1* 1.2*  HGB 9.8* 9.1* 9.4* 9.7* 9.6*  HCT 29.5* 27.7* 26.8* 27.2* 27.1*  MCV 87.5 89.4 86.2 86.1 85.5  PLT 125* 117* 116* 113* 113*   Cardiac Enzymes: No results for input(s): CKTOTAL, CKMB, CKMBINDEX, TROPONINI in the last 168 hours. BNP: BNP (last 3 results) Recent Labs    08/19/20 1533  BNP 61.8    ProBNP (last 3 results) No results for input(s): PROBNP in the last 8760 hours.  CBG: Recent Labs  Lab 09/03/20 1156 09/03/20 2232 09/04/20 0627 09/04/20 1215 09/04/20 1712  GLUCAP 165* 207* 160* 245*  172*       Signed:  Kayleen Memos, MD [facility] 09/04/2020, 5:34 PM

## 2020-09-04 NOTE — Progress Notes (Addendum)
Patient's paper prescriptions for Miralax, Ensure enlive, Antivert, Losartan, Cefdinir and Rosuvastatin placed in patient's discharge folder. Report called to [place] at [place].

## 2020-09-04 NOTE — Progress Notes (Signed)
Physical Therapy Treatment Patient Details Name: Kara Griffin MRN: [id number] DOB: 10/09/1935 Today's Date: 09/04/2020    History of Present Illness 84 y.o. female with history of myelodysplastic syndrome being followed by Dr. Florene Glen at [facility] on [place], pancytopenia, diabetes mellitus, hypertension and hyperlipidemia was brought to the ED by patient's son after patient was found to be increasingly confused last 2 days. On August 19, 2020, patient was taken to [facility] after patient had a fall at home. CT and xray were negative during that ED visit and pt was d/c'd home. Pt underwent MRI 12/23 which revealed small R bagal ganglia lacunar infarct. MRI 09/02/20 revealed newly seen 5-7 punctate foci of acute infarctions scattered  within the inferior cerebellum on both sides. No large confluent  infarction. Being a different vascular territory, this suggests  embolic disease from the heart or ascending aorta.    PT Comments    Pt continues to demonstrate confusion being only oriented to herself and requiring repeated multi-modal single step cues throughout to maintain safety and sequence tasks. Pt continues to attempt to pull up on RW to come to stand and requires multiple hand-over-hand cues to keep hands on current sitting surface to push up instead, with minA. Pt displayed fatigue during session, limiting her progress as she was able to come to sit EOB with HOB elevated initially with only minA but then on 2nd rep she required modA. In addition, during 1st stand step transfer and gait bout of ~3 ft with a RW pt required minA but on second bout she required modA. During her second gait bout she demonstrated poor motor control and safety awareness, pushing posteriorly and demonstrating decreased weight shifting preventing foot clearance to take steps despite cues to correct. Will continue to follow acutely. Current recommendations remain appropriate.   Follow Up Recommendations   SNF;Supervision/Assistance - 24 hour     Equipment Recommendations  Wheelchair (measurements PT);Wheelchair cushion (measurements PT);Hospital bed (if pt d/c's home)    Recommendations for Other Services       Precautions / Restrictions Precautions Precautions: Fall Restrictions Weight Bearing Restrictions: No    Mobility  Bed Mobility Overal bed mobility: Needs Assistance Bed Mobility: Supine to Sit;Rolling;Sidelying to Sit Rolling: Min guard Sidelying to sit: Mod assist;HOB elevated Supine to sit: HOB elevated;Min assist     General bed mobility comments: Pt able to transition legs off R EOB with cues for hand placement on rails to pull to ascend trunk. MinA towards the end to square hips with EOB and scoot anteriorly. Roll to L with min guard and modA to manage legs and trunk to transition sidelying to sit L EOB.  Transfers Overall transfer level: Needs assistance Equipment used: Rolling walker (2 wheeled) Transfers: Sit to/from [facility] Sit to Stand: Min assist Stand pivot transfers: Mod assist       General transfer comment: Repeated hand-over-hand cues to push up from current sitting surface, requiring assistance to hold them there while initiating sit to stand rather than reaching for RW. MinA when hands remained on arm rests or bed to push up to stand, 2x from bed 1x from commode. MinA-modA  to manage RW and direct pt to step laterally to pivot to commode or chair, with pt pushing posteriorly on second rep resulting in modA for safety.  Ambulation/Gait Ambulation/Gait assistance: Mod assist Gait Distance (Feet): 3 Feet (x2 bouts) Assistive device: Rolling walker (2 wheeled) Gait Pattern/deviations: Step-through pattern;Decreased stride length;Trunk flexed;Decreased step length - left;Decreased stance  time - right;Decreased stance time - left;Decreased weight shift to right;Decreased weight shift to left;Shuffle Gait velocity: decr Gait velocity  interpretation: <1.31 ft/sec, indicative of household ambulator General Gait Details: Ambulates with trunk flexed and pushing RW distal to body requiring repeated cues and assistance to manage RW safely throughout. Displays bilat knee flexion throughout and decreased weight shifting, esp to R, and step length bilat, esp on L. Provided verbal and tactile cues and minA-modA to balance pt and shift weight to each leg, esp as pt fatigued and pushed posteriorly during second bout Cues provided to inc step length, with min success and carryover.   Stairs             Wheelchair Mobility    Modified Rankin (Stroke Patients Only) Modified Rankin (Stroke Patients Only) Pre-Morbid Rankin Score: No significant disability Modified Rankin: Moderately severe disability     Balance Overall balance assessment: Needs assistance Sitting-balance support: Feet supported;No upper extremity supported Sitting balance-Leahy Scale: Fair Sitting balance - Comments: Static sitting EOB with min guard assist.   Standing balance support: Bilateral upper extremity supported;During functional activity Standing balance-Leahy Scale: Poor Standing balance comment: reliant on external support                            Cognition Arousal/Alertness: Awake/alert Behavior During Therapy: WFL for tasks assessed/performed Overall Cognitive Status: Impaired/Different from baseline Area of Impairment: Attention;Following commands;Safety/judgement;Problem solving;Memory;Awareness;Orientation                 Orientation Level: Disoriented to;Place;Time;Situation Current Attention Level: Selective Memory: Decreased short-term memory Following Commands: Follows one step commands with increased time Safety/Judgement: Decreased awareness of safety;Decreased awareness of deficits Awareness: Emergent Problem Solving: Slow processing;Decreased initiation;Difficulty sequencing;Requires verbal cues;Requires  tactile cues General Comments: Pt with poor safety awareness. Repeated verbal and tactile cues for proper hand placement with transfers, with very poor carryover. Required multi-modal single steps cues throughout to sequence and initiate tasks and to maintain safety.      Exercises      General Comments General comments (skin integrity, edema, etc.): BP start of session supine HOB elevated 142/74, BP sitting EOB 164/76, BP end of session sitting in chair 149/80      Pertinent Vitals/Pain Pain Assessment: No/denies pain Pain Intervention(s): Monitored during session    Home Living                      Prior Function            PT Goals (current goals can now be found in the care plan section) Acute Rehab PT Goals Patient Stated Goal: sons present and reported desire for pt to be in more "vertical" position more often PT Goal Formulation: With patient/family Time For Goal Achievement: 09/11/20 Potential to Achieve Goals: Good Progress towards PT goals: Progressing toward goals    Frequency    Min 3X/week      PT Plan Current plan remains appropriate    Co-evaluation              AM-PAC PT "6 Clicks" Mobility   Outcome Measure  Help needed turning from your back to your side while in a flat bed without using bedrails?: A Little Help needed moving from lying on your back to sitting on the side of a flat bed without using bedrails?: A Lot Help needed moving to and from a bed to a chair (including a wheelchair)?: A  Lot Help needed standing up from a chair using your arms (e.g., wheelchair or bedside chair)?: A Little Help needed to walk in hospital room?: A Lot Help needed climbing 3-5 steps with a railing? : Total 6 Click Score: 13    End of Session Equipment Utilized During Treatment: Gait belt Activity Tolerance: Patient limited by fatigue Patient left: in chair;with call bell/phone within reach;with chair alarm set   PT Visit Diagnosis:  Unsteadiness on feet (R26.81);Other abnormalities of gait and mobility (R26.89);Muscle weakness (generalized) (M62.81);Pain;Difficulty in walking, not elsewhere classified (R26.2)     Time: [id number] PT Time Calculation (min) (ACUTE ONLY): 37 min  Charges:  $Therapeutic Activity: 23-37 mins                     Moishe Spice, PT, DPT Acute Rehabilitation Services  Pager: [phone] Office: [place] 09/04/2020, 9:37 AM

## 2020-09-04 NOTE — TOC Transition Note (Signed)
Transition of Care [facility]) - CM/SW Discharge Note   Patient Details  Name: Kara Griffin MRN: [id number] Date of Birth: [birth year]  Transition of [facility] CM/SW Contact:  Verna Czech [place], [place] Phone Number: 09/04/2020, 10:47 AM   Clinical Narrative:    Phone call to patient's son [name] [phone] to discuss patient's discharge to the [facility] SN[facility] today. Patient's son agrees with discharge today pending negative COVID test. Patient to be transported by [facility]. Bed offer confirmed by Alphonzo Lemmings, she is requesting that the discharge summary be available by 1pm  as their pharmacy will be closing early today. Patient will be going in to room #7013. Report to be called in to [phone].  Transition of Care to continue to follow  [facility], LCSW Transition of Care [phone]         Barriers to Discharge: SNF Pending bed offer,Insurance Authorization,Continued Medical Work up   Patient Goals and CMS Choice Patient states their goals for this hospitalization and ongoing recovery are:: Pt and son agreeable to SNF placement. CMS Medicare.gov Compare Post Acute Care list provided to:: Patient Choice offered to / list presented to : Patient  Discharge Placement                       Discharge Plan and Services     Post Acute Care Choice: Skilled Nursing Facility                               Social Determinants of Health (SDOH) Interventions     Readmission Risk Interventions No flowsheet data found.

## 2020-09-05 DIAGNOSIS — E1165 Type 2 diabetes mellitus with hyperglycemia: Secondary | ICD-10-CM | POA: Diagnosis not present

## 2020-09-05 LAB — URINALYSIS, ROUTINE W REFLEX MICROSCOPIC
Bilirubin Urine: NEGATIVE
Glucose, UA: >=500 mg/dL — AB
Ketones, ur: 5 mg/dL — AB
Leukocytes,Ua: NEGATIVE
Nitrite: NEGATIVE
Protein, ur: NEGATIVE mg/dL
Specific Gravity, Urine: 1.011 (ref 1.005–1.030)
pH: 7 (ref 5.0–8.0)

## 2020-09-05 LAB — GLUCOSE, CAPILLARY
Glucose-Capillary: 168 mg/dL — ABNORMAL HIGH (ref 70–99)
Glucose-Capillary: 184 mg/dL — ABNORMAL HIGH (ref 70–99)
Glucose-Capillary: 153 mg/dL — ABNORMAL HIGH (ref 70–99)
Glucose-Capillary: 163 mg/dL — ABNORMAL HIGH (ref 70–99)

## 2020-09-05 MED ORDER — PANTOPRAZOLE SODIUM 40 MG IV SOLR
40.0000 mg | Freq: Two times a day (BID) | INTRAVENOUS | Status: AC
  Administered 2020-09-05 – 2020-09-06 (×4): 40 mg via INTRAVENOUS
  Filled 2020-09-05 (×4): qty 40

## 2020-09-05 MED ORDER — METOPROLOL TARTRATE 5 MG/5ML IV SOLN
2.5000 mg | INTRAVENOUS | Status: DC | PRN
  Administered 2020-09-05: 2.5 mg via INTRAVENOUS
  Filled 2020-09-05: qty 5

## 2020-09-05 MED ORDER — LOSARTAN POTASSIUM 25 MG PO TABS: 75.0000 mg | ORAL_TABLET | Freq: Every day | ORAL | 0 refills | Status: AC

## 2020-09-05 MED ORDER — LOSARTAN POTASSIUM 25 MG PO TABS
75.0000 mg | ORAL_TABLET | Freq: Every day | ORAL | Status: DC
  Administered 2020-09-05: 75 mg via ORAL
  Filled 2020-09-05: qty 3

## 2020-09-05 MED ORDER — METOPROLOL TARTRATE 5 MG/5ML IV SOLN
2.5000 mg | INTRAVENOUS | Status: DC | PRN
  Administered 2020-09-05 – 2020-09-10 (×3): 2.5 mg via INTRAVENOUS
  Filled 2020-09-05 (×3): qty 5

## 2020-09-05 MED ORDER — LOSARTAN POTASSIUM 50 MG PO TABS
100.0000 mg | ORAL_TABLET | Freq: Every day | ORAL | Status: DC
  Filled 2020-09-05: qty 2

## 2020-09-05 MED ORDER — LOSARTAN POTASSIUM 25 MG PO TABS: 25.0000 mg | ORAL_TABLET | Freq: Once | ORAL | Status: DC

## 2020-09-05 MED ORDER — SODIUM CHLORIDE 0.9 % IV SOLN: INTRAVENOUS | Status: AC

## 2020-09-05 MED ORDER — ACETAMINOPHEN 650 MG RE SUPP
650.0000 mg | Freq: Four times a day (QID) | RECTAL | Status: DC | PRN
  Administered 2020-09-05 – 2020-09-11 (×4): 650 mg via RECTAL
  Filled 2020-09-05 (×5): qty 1

## 2020-09-05 NOTE — TOC Progression Note (Signed)
Transition of Care [facility]) - Progression Note    Patient Details  Name: Kara Griffin MRN: [id number] Date of Birth: December 31, 1935  Transition of Care [facility] CM/SW Contact  Carley Hammed, [place] Phone Number: 09/05/2020, 9:34 AM  Clinical Narrative:    CSW noted that pt was meant to DC yesterday, but was still here. Nurse advised that pt had some altered mental and they were taking a second look at her. A voicemail was left for Whitney at [facility] to update her. CSW will continue to follow.   Expected Discharge Plan: Skilled Nursing Facility Barriers to Discharge: SNF Pending bed offer,Insurance Authorization,Continued Medical Work up  Expected Discharge Plan and Services Expected Discharge Plan: Skilled Nursing Facility     Post Acute Care Choice: Skilled Nursing Facility Living arrangements for the past 2 months: Single Family Home Expected Discharge Date: 09/05/20                                     Social Determinants of Health (SDOH) Interventions    Readmission Risk Interventions No flowsheet data found.

## 2020-09-05 NOTE — Discharge Summary (Addendum)
Discharge Summary  Kara Griffin HUO:[id number] DOB: 01-Mar-1936  PCP: Einar Pheasant, MD  Admit date: 08/10/2020 Discharge date: 09/05/2020  Time spent: 35 minutes  Recommendations for Outpatient Follow-up:  1. Follow-up with neurology in 4 weeks. 2. Follow up with cardiology Dr. Sallyanne Kuster for your 30 day monitor to rule out Afib. 3. Follow-up with your primary care provider in 1 to 2 weeks 4. Take your medications as prescribed 5. Continue PT OT with assistance and fall precautions  Discharge Diagnoses:  Active Hospital Problems   Diagnosis Date Noted  . Acute encephalopathy 08/24/2020  . Pressure injury of skin 09/04/2020  . Cerebral thrombosis with cerebral infarction 08/28/2020  . Ischemic stroke ([place]) 08/28/2020  . Pancytopenia ([place]) 08/26/2020  . Myelodysplastic syndrome ([place]) 06/04/2012  . Diabetes mellitus ([place]) 06/04/2012    Resolved Hospital Problems  No resolved problems to display.    Discharge Condition: Stable  Diet recommendation: Resume previous diet  Vitals:   09/05/20 0400 09/05/20 0756  BP: (!) 163/68 (!) 160/70  Pulse: 70 73  Resp: 16 18  Temp: 97.8 F (36.6 C) 98.4 F (36.9 C)  SpO2: 99% 100%    History of present illness:  Patient is 85 year old female with past medical history of myelodysplastic syndrome-followed by Dr. Florene Glen at [facility] on Revlimid, pancytopenia, diabetes mellitus, dizziness, hypertension, hyperlipidemia presents to emergency department with increasingly confusion of 2 days duration.  Per her son, since Thanksgiving almost a month and a half ago she has become gradually weak and not herself. Recently her blood pressure was found to be low and her primary care physician decreased her antihypertensivesduring her most recent visit.  On August 19, 2020 about 8 days prior to her presentation, patient was taken to [facility] after she had a fall at home. CT head C-spine and abdomen were done which showed features concerning  for right-sided mucous plugging and MAI features and was given Z-Pak. Following which patient had episodes of diarrhea. No reported fevers at home. Has been having poor appetite with some nausea. CT scan of abdomen showed some gallbladder distention with no definite signs of inflammation.  ED Course:In the ER patient is weak oriented to name and place. Moving all extremities. Covid test was negative.  CBC shows pancytopenia at baseline. LFTs were normal. MRI brain was ordered and she was admitted for acute encephalopathy with generalized weakness and poor appetite.  MRI brain revealed acute small right basal ganglia lacunar infarct likely secondary to small vessel disease.  She completed a stroke work-up.  MRA brain no large vessel obstruction.  Moderate L A1 stenosis.  Carotid Doppler ultrasound unremarkable.  2D echo EF 75%, no source of embolus.  EEG suggestive of mild diffuse encephalopathy, nonspecific etiology.  No seizures or epileptiform discharges were seen throughout the recording.  LDL 35.  Hemoglobin A1c 6.0.  Seen by neurology/stroke team with recommendation for aspirin 81 mg daily and Plavix 75 mg daily for 3 weeks followed by aspirin alone. PT OT recommended SNF.  Due to concern of her son about her mentation.  Repeat MRI brain was done on 09/02/2020 which showed new 5-7 punctate foci of acute infarctions scattered within the inferior cerebellum on both sides, suggestive of embolic disease from the heart or ascending aorta.  Cardiology was contacted for arrangement of 30-day cardiac event monitoring outpatient.  She had a CT a head and neck completed which was unremarkable.  Bilateral lower extremity venous Doppler ultrasound was negative for DVT.  Neurology signed off.  She will follow-up outpatient at [facility] in about 4 weeks.  Hospital course complicated by delirium with waxing and waning altered mental status.  Her sons strongly believes that she has a UTI.  Urine culture was  negative.  She was given additional 3 days of antibiotics.  Lactic acid and procalcitonin negative.  Afebrile.  No evidence of active infective process.  DC'd antibiotics on 09/05/2020.  09/05/20: Patient had a recurrence of altered mental status overnight.  CT head was done which was negative for any acute intracranial findings.  CBC with differential and BMP were ordered overnight which showed no significant difference from previous.  Seen and examined at her bedside this morning with her 2 sons present in the room.  They believe that her altered mental status is worse today.  At the time of this exam patient is somnolent but easily arousable to voices and answers questions when asked, although pleasantly confused.  Per her sons she was having trouble swallowing her pills as well.  Speech therapist has been reconsulted to further assess.  Addendum: UA ordered overnight at patient's family's request is negative for pyuria.   Hospital Course:  Principal Problem:   Acute encephalopathy Active Problems:   Myelodysplastic syndrome ([place])   Diabetes mellitus ([place])   Pancytopenia ([place])   Cerebral thrombosis with cerebral infarction   Ischemic stroke ([facility])   Pressure injury of skin  Acute ischemic cryptogenic stroke: Acute right basal ganglia lacunar infarct/new 5-7 punctate foci of acute infarctions scattered within the inferior cerebellum on both sides, suggestive of embolic disease from the heart or ascending aorta. Reviewed MRI brain done on 09/02/20 MRI brain revealed acute small right basal ganglia lacunar infarct likely secondary to small vessel disease.  She completed a stroke work-up.  MRA brain no large vessel obstruction.  Moderate L A1 stenosis.  Carotid Doppler ultrasound unremarkable.  2D echo EF 75%, no source of embolus.  EEG suggestive of mild diffuse encephalopathy, nonspecific etiology.  No seizures or epileptiform discharges were seen throughout the recording.  LDL 35, at goal.   Hemoglobin A1c 6.0, at goal. Seen by neurology/stroke team with recommendation for aspirin 81 mg daily and Plavix 75 mg daily for 3 weeks followed by aspirin alone. PT OT recommended SNF.  She has a bed at SNF however patient's sons believe her mentation is worse today.  Discharge has been delayed on 09/05/2020 Bilateral lower extremity Doppler ultrasound negative for DVT. CTA head and neck, unremarkable. Neurology signed off.  She will follow-up outpatient at [facility] in about 4 weeks. Cardiology has arranged for a 30-day cardiac event monitoring.  Cardiology's office will be reaching out to the patient to arrange the monitor and follow-up in 2 months to review results.  Acute metabolic encephalopathy likely multifactorial secondary to advanced age and acute CVA, unclear if she really had the UTI with no positive urine cultures: UA positive for pyuria on 08/23/2020 however urine culture was inconclusive, grew multiple species on 08/13/2020, and then repeated urine culture showed no growth on 08/29/2020. Family is convinced that the patient has had a urinary tract infection.  She received 3 days of IV antibiotics.  Restarted Rocephin on 09/02/2020 for presumptive UTI.  Also started Florastor 250 mg twice daily.  Antibiotics stopped on 09/05/2020 due to no clear evidence of active infective process. She initially presented with a fever of 100.5 in the ED. Was started on broad-spectrum antibiotics. -Patient has remained afebrile. Blood culture x2 - final.  -B12, folate, TSH, ammonia level,  RPR: All came back within normal limits. -EEG negative for seizures -CT renal stone study, right upper quadrant ultrasound, chest x-ray: Negative for acute findings. COVID-19 negative.  -Received on admission, IV cefepime for 1 day and then 2 days ofIV Rocephin, then it was DC'd.  Lactic acid 1.0, procalcitonin 0.16 Altered mental status has waxed and waned throughout this admission. Continue, aspiration, delirium  precautions. Reorient as needed.  Heterogeneous left thyroid lobe nodule measuring 1.2 cm, incidental findings TSH normal 1.571 on 08/24/2020 She will need to have ultrasound and follow-up outpatient with her PCP. Informed her son at bedside. Follow-up with your PCP in 1 to 2 weeks.  Hypertension: Gradually normalized BP. Dose of losartan increased to 75 mg daily. Follow-up with your PCP.  Hyperlipidemia: Goal LDL less than 70, LDL 35 on 08/28/2020, she is at goal..  Continue Crestor 10 mg daily.  Type 2 diabetes mellitus:Well controlled. A1c 6.0%.  Goal hemoglobin A1c less than 7.0, she is at goal.Continue sliding scale insulin while inpatient.  Myelodysplastic syndrome and pancytopenia: -Followed by oncologist Dr. Florene Glen at [facility]. -WBC/H&H/platelet: At baseline. Continue to monitor.  -Continue Revlimid -Follow-up with your heme oncologist in 1 to 2 weeks.  Resolved refractory hypokalemia:  Serum potassium 3.7 from 2.9, repleted orally.  Recheck in the morning.  Mild euvolemic hyponatremia: Sodium 133 from 135 from 132.  Received normal saline IV hydration.  Constipation: Received bowel regimen.  History of dizziness Stable Meclizine as needed Last dose was given on 08/31/2020    Code Status:Full code Family Communication:Updated patient's sons at bedside.   Consultants:  Neurology  Procedures:  MRI/MRA brain  EEG  Carotid Doppler  CT renal study  Right upper quadrant ultrasound  Antimicrobials:   Cefepime  Vancomycin  Rocephin     Discharge Exam: BP (!) 160/70 (BP Location: Right Arm)   Pulse 73   Temp 98.4 F (36.9 C) (Oral)   Resp 18   LMP 08/13/1984   SpO2 100%  . General: 85 y.o. year-old female well developed well nourished in no acute distress.  Somnolent but easily arousable to voices.  Answers questions when asked.  Pleasantly confused. . Cardiovascular: Regular rate and rhythm no rubs or  gallops..   . Respiratory: Clear to auscultation no wheezes or rales. . Abdomen: Soft uncomfortable with palpation diffusely.  Bowel sounds present.   . Musculoskeletal: No lower extremity edema bilaterally. Marland Kitchen Psychiatry: Mood is appropriate for condition and Griffin.   Discharge Instructions You were cared for by a hospitalist during your hospital stay. If you have any questions about your discharge medications or the care you received while you were in the hospital after you are discharged, you can call the unit and asked to speak with the hospitalist on call if the hospitalist that took care of you is not available. Once you are discharged, your primary care physician will handle any further medical issues. Please note that NO REFILLS for any discharge medications will be authorized once you are discharged, as it is imperative that you return to your primary care physician (or establish a relationship with a primary care physician if you do not have one) for your aftercare needs so that they can reassess your need for medications and monitor your lab values.  Discharge Instructions    Ambulatory referral to Neurology   Complete by: As directed    Follow up with stroke clinic NP (Jessica Vanschaick or Cecille Rubin, if both not available, consider Zachery Dauer, or Jaynee Eagles) at  [facility] in about 4 weeks. Thanks.   Diet - low sodium heart healthy   Complete by: As directed    Discharge instructions   Complete by: As directed    Follow-up with PCP in 1 week Repeat CBC and BMP on follow-up visit Follow-up with ENT for dizziness Continue meclizine as needed for dizziness Continue Plavix for 3 weeks and then aspirin alone   Increase activity slowly   Complete by: As directed      Allergies as of 09/05/2020      Reactions   Ibuprofen    Lowers WBCs      Medication List    TAKE these medications   Accu-Chek Guide test strip Generic drug: glucose blood USE TO CHECK BLOOD SUGARS TWICE DAILY    Accu-Chek Softclix Lancets lancets CHECK BLOOD SUGAR TWICE DAILY   aspirin 81 MG tablet Take 1 tablet (81 mg total) by mouth daily.   azelastine 0.1 % nasal spray Commonly known as: ASTELIN PLACE 1 SPRAY INTO BOTH NOSTRILS TWICE DAILY What changed: See the new instructions.   blood glucose meter kit and supplies Kit Dispense based on patient and insurance preference. Use to check sugars twice daily. DX E11.9   Calcium Carbonate-Vitamin D 600-400 MG-UNIT tablet Take 1 tablet by mouth at bedtime. Reported on 01/15/2016   clopidogrel 75 MG tablet Commonly known as: PLAVIX Take 1 tablet (75 mg total) by mouth daily for 21 days.   feeding supplement Liqd Take 237 mLs by mouth 3 (three) times daily between meals for 7 days.   fluticasone 50 MCG/ACT nasal spray Commonly known as: FLONASE Place 2 sprays into both nostrils daily.   lenalidomide 10 MG capsule Commonly known as: REVLIMID Take 10 mg by mouth daily.   losartan 25 MG tablet Commonly known as: COZAAR Take 3 tablets (75 mg total) by mouth daily. What changed: See the new instructions.   meclizine 12.5 MG tablet Commonly known as: ANTIVERT Take 1 tablet (12.5 mg total) by mouth 3 (three) times daily as needed for dizziness.   metFORMIN 1000 MG tablet Commonly known as: GLUCOPHAGE TAKE 1 TABLET(1000 MG) BY MOUTH TWICE DAILY What changed: See the new instructions.   multivitamin tablet Take 1 tablet by mouth daily.   polyethylene glycol 17 g packet Commonly known as: MIRALAX / GLYCOLAX Take 17 g by mouth daily as needed.   potassium chloride SA 20 MEQ tablet Commonly known as: KLOR-CON Take 1 tablet (20 mEq total) by mouth daily for 5 days.   PreserVision AREDS 2+Multi Vit Caps Take 1 capsule by mouth 2 (two) times daily.   rosuvastatin 10 MG tablet Commonly known as: CRESTOR Take 1 tablet (10 mg total) by mouth daily. What changed: See the new instructions.   sitaGLIPtin 100 MG tablet Commonly known  as: Januvia TAKE 1 TABLET BY MOUTH DAILY What changed:   how much to take  how to take this  when to take this  additional instructions   vitamin B-12 500 MCG tablet Commonly known as: CYANOCOBALAMIN Take 1,000 mcg by mouth daily.      Allergies  Allergen Reactions  . Ibuprofen     Lowers WBCs    Contact information for follow-up providers    [facility] Follow up in 4 week(s).   Specialty: Neurology Why: stroke clinic. office will call with appt date and time Contact information: [address] [place]       Einar Pheasant, MD. Call  in 1 day(s).   Specialty: Internal Medicine Why: Please call for a post hospital follow-up appointment. Contact information: [address] [place] [address] [phone]        Brantley Fling, MD. Call in 1 day(s).   Specialty: Internal Medicine Why: Please call for a post hospital follow-up appointment. Contact information: [place] [address] [phone]        Sanda Klein, MD. Call in 1 day(s).   Specialty: Cardiology Why: Please follow up with Dr. Sallyanne Kuster for your 30 day monitor to rule out Afib. Contact information: [address] [place] [address] [phone]            Contact information for after-discharge care    Destination    [facility] .   Service: Skilled Nursing Contact information: [address] [place] [phone]                   The results of significant diagnostics from this hospitalization (including imaging, microbiology, ancillary and laboratory) are listed below for reference.    Significant Diagnostic Studies: CT ANGIO HEAD W OR WO CONTRAST  Result Date: 09/02/2020 CLINICAL DATA:  Stroke/TIA. Assess intracranial and extracranial arteries. EXAM: CT ANGIOGRAPHY  HEAD AND NECK TECHNIQUE: Multidetector CT imaging of the head and neck was performed using the standard protocol during bolus administration of intravenous contrast. Multiplanar CT image reconstructions and MIPs were obtained to evaluate the vascular anatomy. Carotid stenosis measurements (when applicable) are obtained utilizing NASCET criteria, using the distal internal carotid diameter as the denominator. CONTRAST:  57m OMNIPAQUE IOHEXOL 350 MG/ML SOLN COMPARISON:  MRI of the brain September 03, 2019. FINDINGS: CT HEAD FINDINGS Brain: No evidence of acute infarction, hemorrhage, hydrocephalus, extra-axial collection or mass lesion/mass effect. Acute small infarcts are better demonstrated on recent MRI of the brain performed earlier today. Vascular: Calcified plaques in the bilateral carotid siphons and bilateral vertebral arteries. Skull: Normal. Negative for fracture or focal lesion. Sinuses: Imaged portions are clear. Orbits: No acute finding. Review of the MIP images confirms the above findings CTA NECK FINDINGS Aortic arch: Standard branching. Imaged portion shows no evidence of aneurysm or dissection. No significant stenosis of the major arch vessel origins. Right carotid system: Increased tortuosity of the innominate and proximal right common carotid artery and distal cervical segment of the right ICA. Mild atherosclerotic changes in the right carotid bifurcation. No evidence of dissection, stenosis (50% or greater) or occlusion. Left carotid system: Mild luminal irregularity cervical segment of the left ICA, likely related to mild atherosclerotic disease without stenosis. There is also increased tortuosity of the upper cervical segment of the left ICA. No evidence of dissection, stenosis (50% or greater) or occlusion. Vertebral arteries: The left vertebral artery is dominant. No evidence of dissection, stenosis (50% or greater) or occlusion. Skeleton: Degenerative changes of the cervical. No aggressive bone  lesion seen. Other neck: Heterogeneous left thyroid lobe nodule measuring 1.2 cm. Upper chest: Biapical scarring and ground-glass opacities. Review of the MIP images confirms the above findings CTA HEAD FINDINGS Anterior circulation: No significant stenosis, proximal occlusion, aneurysm, or vascular malformation. Posterior circulation: Minutes if caliber of the V4 segment of the right vertebral artery with a dominant left vertebral artery. No significant stenosis, proximal occlusion, aneurysm, or vascular malformation. Venous sinuses: As permitted by contrast timing, patent. Anatomic variants: None. Review of the MIP images confirms the above findings IMPRESSION: 1. No large  vessel occlusion, hemodynamically significant stenosis, or evidence of dissection. 2. Heterogeneous left thyroid lobe nodule measuring 1.2 cm. Electronically Signed   By: Pedro Earls M.D.   On: 09/02/2020 18:17   DG Chest 1 View  Result Date: 08/19/2020 CLINICAL DATA:  Weakness with increased cough. Fall today. EXAM: CHEST  1 VIEW COMPARISON:  Radiograph 01/26/2016 FINDINGS: Heart is normal in size. Unchanged mediastinal contours. There is chronic hyperinflation. Bronchial thickening appears chronic but increased from prior exam. Subsegmental opacities in both mid lung zones and left lung base. Chronic biapical pleuroparenchymal scarring. No significant pleural effusion. No pneumothorax. No acute osseous abnormalities are seen. IMPRESSION: 1. Subsegmental opacities in both mid lung zones and left lung base, favor atelectasis or scarring, however atypical pneumonia could have a similar appearance. 2. Chronic but increased bronchial thickening may represent acute bronchitis. Chronic hyperinflation. Electronically Signed   By: Keith Rake M.D.   On: 08/19/2020 15:23   DG Lumbar Spine 2-3 Views  Result Date: 08/19/2020 CLINICAL DATA:  Weakness and cough. Fall today. EXAM: LUMBAR SPINE - 2-3 VIEW COMPARISON:  Lumbar  radiograph 07/16/2018 FINDINGS: Slight dextroscoliotic curvature unchanged from prior exam. No listhesis. The vertebral body heights are preserved. Disc space narrowing and endplate spurring at W0-J8, L4-L5, and L5-S1. There is lower lumbar facet hypertrophy. No acute fracture. The sacroiliac joints are congruent. IMPRESSION: 1. No acute fracture or subluxation of the lumbar spine. 2. Mild scoliosis with stable degenerative change from 2019. Electronically Signed   By: Keith Rake M.D.   On: 08/19/2020 15:25   DG Abd 1 View  Result Date: 08/29/2020 CLINICAL DATA:  Abdominal pain and bloating. EXAM: ABDOMEN - 1 VIEW COMPARISON:  None. FINDINGS: The bowel gas pattern is normal, with large amount of air seen throughout the colon. No radio-opaque calculi or other significant radiographic abnormality are seen. Subcentimeter phleboliths are noted within lower pelvis. IMPRESSION: Negative. Electronically Signed   By: Virgina Norfolk M.D.   On: 08/29/2020 22:54   CT HEAD WO CONTRAST  Result Date: 09/04/2020 CLINICAL DATA:  Cerebral infarction, follow-up examination EXAM: CT HEAD WITHOUT CONTRAST TECHNIQUE: Contiguous axial images were obtained from the base of the skull through the vertex without intravenous contrast. COMPARISON:  MRI 09/02/2020 FINDINGS: Brain: Normal anatomic configuration. Parenchymal volume loss is commensurate with the patient's age. Mild periventricular white matter changes are present likely reflecting the sequela of small vessel ischemia. The known punctate infarcts within the right putamen and cerebellum are not well appreciated on this examination. No abnormal intra or extra-axial mass lesion or fluid collection. No abnormal mass effect or midline shift. No evidence of acute intracranial hemorrhage or infarct. Ventricular size is normal. Cerebellum unremarkable. Vascular: No asymmetric hyperdense vasculature at the skull base. Skull: Intact Sinuses/Orbits: Paranasal sinuses are  clear. Orbits are unremarkable. Other: Mastoid air cells and middle ear cavities are clear. IMPRESSION: Known punctate infarcts within the right putamen and cerebellum noted on prior MRI examination are not well appreciated on this examination. No definite evidence of acute intracranial hemorrhage or infarct. Electronically Signed   By: Fidela Salisbury MD   On: 09/04/2020 21:10   CT Head Wo Contrast  Result Date: 08/19/2020 CLINICAL DATA:  85 year old female with history of minor head trauma from a fall today. EXAM: CT HEAD WITHOUT CONTRAST CT CERVICAL SPINE WITHOUT CONTRAST TECHNIQUE: Multidetector CT imaging of the head and cervical spine was performed following the standard protocol without intravenous contrast. Multiplanar CT image reconstructions of the cervical spine  were also generated. COMPARISON:  None. No priors. FINDINGS: CT HEAD FINDINGS Brain: Mild cerebral atrophy. Patchy and confluent areas of decreased attenuation are noted throughout the deep and periventricular white matter of the cerebral hemispheres bilaterally, compatible with chronic microvascular ischemic disease. No evidence of acute infarction, hemorrhage, hydrocephalus, extra-axial collection or mass lesion/mass effect. Vascular: No hyperdense vessel or unexpected calcification. Skull: Normal. Negative for fracture or focal lesion. Sinuses/Orbits: No acute finding. Other: None. CT CERVICAL SPINE FINDINGS Alignment: Normal. Skull base and vertebrae: No acute fracture. No primary bone lesion or focal pathologic process. Soft tissues and spinal canal: No prevertebral fluid or swelling. No visible canal hematoma. Disc levels: Very mild multilevel degenerative disc disease and facet arthropathy. Upper chest: Bilateral apical pleuroparenchymal thickening and architectural distortion, most compatible with areas of chronic post infectious or inflammatory scarring. Other: None. IMPRESSION: 1. No evidence of significant acute traumatic injury to  the skull, brain or cervical spine. 2. Mild cerebral atrophy with chronic microvascular ischemic changes in the cerebral white matter. 3. Very mild multilevel degenerative disc disease and facet arthropathy. Electronically Signed   By: Vinnie Langton M.D.   On: 08/19/2020 18:42   CT ANGIO NECK W OR WO CONTRAST  Result Date: 09/02/2020 CLINICAL DATA:  Stroke/TIA. Assess intracranial and extracranial arteries. EXAM: CT ANGIOGRAPHY HEAD AND NECK TECHNIQUE: Multidetector CT imaging of the head and neck was performed using the standard protocol during bolus administration of intravenous contrast. Multiplanar CT image reconstructions and MIPs were obtained to evaluate the vascular anatomy. Carotid stenosis measurements (when applicable) are obtained utilizing NASCET criteria, using the distal internal carotid diameter as the denominator. CONTRAST:  10m OMNIPAQUE IOHEXOL 350 MG/ML SOLN COMPARISON:  MRI of the brain September 03, 2019. FINDINGS: CT HEAD FINDINGS Brain: No evidence of acute infarction, hemorrhage, hydrocephalus, extra-axial collection or mass lesion/mass effect. Acute small infarcts are better demonstrated on recent MRI of the brain performed earlier today. Vascular: Calcified plaques in the bilateral carotid siphons and bilateral vertebral arteries. Skull: Normal. Negative for fracture or focal lesion. Sinuses: Imaged portions are clear. Orbits: No acute finding. Review of the MIP images confirms the above findings CTA NECK FINDINGS Aortic arch: Standard branching. Imaged portion shows no evidence of aneurysm or dissection. No significant stenosis of the major arch vessel origins. Right carotid system: Increased tortuosity of the innominate and proximal right common carotid artery and distal cervical segment of the right ICA. Mild atherosclerotic changes in the right carotid bifurcation. No evidence of dissection, stenosis (50% or greater) or occlusion. Left carotid system: Mild luminal irregularity  cervical segment of the left ICA, likely related to mild atherosclerotic disease without stenosis. There is also increased tortuosity of the upper cervical segment of the left ICA. No evidence of dissection, stenosis (50% or greater) or occlusion. Vertebral arteries: The left vertebral artery is dominant. No evidence of dissection, stenosis (50% or greater) or occlusion. Skeleton: Degenerative changes of the cervical. No aggressive bone lesion seen. Other neck: Heterogeneous left thyroid lobe nodule measuring 1.2 cm. Upper chest: Biapical scarring and ground-glass opacities. Review of the MIP images confirms the above findings CTA HEAD FINDINGS Anterior circulation: No significant stenosis, proximal occlusion, aneurysm, or vascular malformation. Posterior circulation: Minutes if caliber of the V4 segment of the right vertebral artery with a dominant left vertebral artery. No significant stenosis, proximal occlusion, aneurysm, or vascular malformation. Venous sinuses: As permitted by contrast timing, patent. Anatomic variants: None. Review of the MIP images confirms the above findings IMPRESSION: 1. No  large vessel occlusion, hemodynamically significant stenosis, or evidence of dissection. 2. Heterogeneous left thyroid lobe nodule measuring 1.2 cm. Electronically Signed   By: Pedro Earls M.D.   On: 09/02/2020 18:17   CT Cervical Spine Wo Contrast  Result Date: 08/19/2020 CLINICAL DATA:  85 year old female with history of minor head trauma from a fall today. EXAM: CT HEAD WITHOUT CONTRAST CT CERVICAL SPINE WITHOUT CONTRAST TECHNIQUE: Multidetector CT imaging of the head and cervical spine was performed following the standard protocol without intravenous contrast. Multiplanar CT image reconstructions of the cervical spine were also generated. COMPARISON:  None. No priors. FINDINGS: CT HEAD FINDINGS Brain: Mild cerebral atrophy. Patchy and confluent areas of decreased attenuation are noted  throughout the deep and periventricular white matter of the cerebral hemispheres bilaterally, compatible with chronic microvascular ischemic disease. No evidence of acute infarction, hemorrhage, hydrocephalus, extra-axial collection or mass lesion/mass effect. Vascular: No hyperdense vessel or unexpected calcification. Skull: Normal. Negative for fracture or focal lesion. Sinuses/Orbits: No acute finding. Other: None. CT CERVICAL SPINE FINDINGS Alignment: Normal. Skull base and vertebrae: No acute fracture. No primary bone lesion or focal pathologic process. Soft tissues and spinal canal: No prevertebral fluid or swelling. No visible canal hematoma. Disc levels: Very mild multilevel degenerative disc disease and facet arthropathy. Upper chest: Bilateral apical pleuroparenchymal thickening and architectural distortion, most compatible with areas of chronic post infectious or inflammatory scarring. Other: None. IMPRESSION: 1. No evidence of significant acute traumatic injury to the skull, brain or cervical spine. 2. Mild cerebral atrophy with chronic microvascular ischemic changes in the cerebral white matter. 3. Very mild multilevel degenerative disc disease and facet arthropathy. Electronically Signed   By: Vinnie Langton M.D.   On: 08/19/2020 18:42   MR ANGIO HEAD WO CONTRAST  Result Date: 08/28/2020 CLINICAL DATA:  Stroke follow-up. Acute right basal ganglia infarct on earlier MRI. EXAM: MRA HEAD WITHOUT CONTRAST TECHNIQUE: Angiographic images of the Circle of Willis were obtained using MRA technique without intravenous contrast. COMPARISON:  None. FINDINGS: The visualized distal vertebral arteries are widely patent to the basilar with the left being strongly dominant. Patent AICA and SCA origins are identified bilaterally. The basilar artery is widely patent. Posterior communicating arteries are not identified and may be diminutive or absent. The PCAs are patent without evidence of a significant proximal  stenosis. There are distal cervical internal carotid artery loops bilaterally. The intracranial internal carotid arteries are patent without evidence of significant stenosis allowing for motion artifact through the anterior genu of the left ICA. ACAs and MCAs are patent without evidence of a proximal branch occlusion or significant M1 stenosis. Assessment for branch vessel stenosis is limited by motion. There is a moderate stenosis versus motion artifact in the distal left A1 segment. No aneurysm is identified. IMPRESSION: 1. No large vessel occlusion. 2. Moderate left A1 stenosis versus motion artifact. Electronically Signed   By: Logan Bores M.D.   On: 08/28/2020 03:11   MR BRAIN WO CONTRAST  Result Date: 09/02/2020 CLINICAL DATA:  Myelodysplastic syndrome. Acute presentation with confusion. Weakness. EXAM: MRI HEAD WITHOUT CONTRAST TECHNIQUE: Multiplanar, multiecho pulse sequences of the brain and surrounding structures were obtained without intravenous contrast. COMPARISON:  08/28/2020 FINDINGS: Brain: Subcentimeter acute infarction within the putamen on the right is unchanged since the prior exam. No new supratentorial finding. Newly seen today are 5-7 punctate foci of acute infarctions scattered within the inferior cerebellum on both sides. No large confluent infarction. Elsewhere, chronic small-vessel ischemic changes are seen  within the deep and subcortical white matter of both cerebral hemispheres. No large vessel territory infarction. No mass, hemorrhage, hydrocephalus or extra-axial collection. Vascular: Major vessels at the base of the brain show flow. Skull and upper cervical spine: Negative Sinuses/Orbits: Clear/normal Other: None IMPRESSION: 1. Unchanged subcentimeter acute infarction within the putamen on the right. 2. Newly seen 5-7 punctate foci of acute infarctions scattered within the inferior cerebellum on both sides. No large confluent infarction. Being a different vascular territory,  this suggests embolic disease from the heart or ascending aorta. 3. Chronic small-vessel ischemic changes elsewhere throughout the brain as outlined above. 4. Findings discussed with hospitalist physician at the time of interpretation. Electronically Signed   By: Nelson Chimes M.D.   On: 09/02/2020 14:31   MR BRAIN WO CONTRAST  Result Date: 08/28/2020 CLINICAL DATA:  Dizziness.  Memory loss. EXAM: MRI HEAD WITHOUT CONTRAST TECHNIQUE: Multiplanar, multiecho pulse sequences of the brain and surrounding structures were obtained without intravenous contrast. COMPARISON:  Head CT 08/19/2020 FINDINGS: Brain: There is a 4 mm acute infarct at the anteroinferior aspect of the right basal ganglia. Small T2 hyperintensities scattered throughout the cerebral white matter bilaterally are nonspecific but compatible with mild chronic small vessel ischemic disease. Two small adjacent chronic hemorrhages are noted in the subcortical white matter of the left frontal lobe. Mild cerebral atrophy is within normal limits for age. Vascular: Major intracranial vascular flow voids are preserved. Skull and upper cervical spine: Unremarkable bone marrow signal. Sinuses/Orbits: Bilateral cataract extraction. Paranasal sinuses and mastoid air cells are clear. Other: None. IMPRESSION: 1. Acute right basal ganglia lacunar infarct. 2. Mild chronic small vessel ischemic disease. Electronically Signed   By: Logan Bores M.D.   On: 08/28/2020 01:18   CT ABDOMEN PELVIS W CONTRAST  Result Date: 08/19/2020 CLINICAL DATA:  Right lower quadrant abdominal pain. Fall at home. Dizziness. EXAM: CT ABDOMEN AND PELVIS WITH CONTRAST TECHNIQUE: Multidetector CT imaging of the abdomen and pelvis was performed using the standard protocol following bolus administration of intravenous contrast. CONTRAST:  158m OMNIPAQUE IOHEXOL 300 MG/ML  SOLN COMPARISON:  None. FINDINGS: Lower chest: Right middle lobe bronchiectasis with distal mucous plugging.  Reticulonodular opacities in the lingula. Subsegmental opacities in the left lower lobe likely atelectasis. No pleural fluid. No pericardial fluid. No basilar rib fracture. Hepatobiliary: Elongated right lobe of the liver. No focal hepatic abnormality. No evidence of perihepatic hematoma or hepatic injury. Distended gallbladder without pericholecystic inflammation or calcified gallstone. No biliary dilatation. Pancreas: No ductal dilatation or inflammation. Spleen: Subcentimeter hypodensity in the anterior spleen is nonspecific, typically benign. No splenomegaly. No splenic injury or perisplenic hematoma. Adrenals/Urinary Tract: No renal or adrenal injury. There is a 14 mm left adrenal nodule. Lobulated bilateral renal contours. Bilateral cortical cysts. Symmetric prominence of both renal pelvis likely extrarenal pelvis configuration. No perinephric edema. No renal calculi. The urinary bladder is distended. No bladder wall thickening or injury. Stomach/Bowel: Tiny hiatal hernia. The stomach is nondistended. No bowel obstruction, inflammation, or evidence of injury. Sigmoid colon is redundant. Small volume of colonic stool. The appendix is not confidently visualized. Vascular/Lymphatic: Aortic atherosclerosis. Patent portal vein. No acute vascular findings or evidence of injury. No adenopathy. Reproductive: Status post hysterectomy. No adnexal masses. Other: No free air or free fluid. Postsurgical changes the anterior abdominal wall. No body wall hernia. Musculoskeletal: No acute fracture of the pelvis, lumbar spine, or included lower ribs. Multilevel degenerative change with vacuum phenomena and facet hypertrophy in the lumbar spine. IMPRESSION: 1.  The appendix is not visualized, there is no evidence of appendicitis. 2. No acute traumatic injury to the abdomen or pelvis. 3. Distended gallbladder without pericholecystic inflammation or calcified gallstone. If there is clinical concern for acute gallbladder pathology,  recommend right upper quadrant ultrasound. 4. Right middle lobe bronchiectasis with distal mucous plugging. Reticulonodular opacities in the lingula. Findings suggest prior mycobacterium avium infection. 5. Left adrenal nodule measuring 14 mm, nonspecific. In the absence of known malignancy, this is likely benign. Aortic Atherosclerosis (ICD10-I70.0). Electronically Signed   By: Keith Rake M.D.   On: 08/19/2020 18:43   DG Pelvis Portable  Result Date: 08/19/2020 CLINICAL DATA:  Weakness. Fall today. EXAM: PORTABLE PELVIS 1-2 VIEWS COMPARISON:  Pelvis and right hip radiograph on 07/16/2018 FINDINGS: The cortical margins of the bony pelvis are intact. No fracture. Pubic symphysis and sacroiliac joints are congruent. Bilateral acetabular spurring. Both femoral heads are well-seated in the respective acetabula. IMPRESSION: No pelvic fracture. Electronically Signed   By: Keith Rake M.D.   On: 08/19/2020 15:26   DG CHEST PORT 1 VIEW  Result Date: 08/28/2020 CLINICAL DATA:  Fever EXAM: PORTABLE CHEST 1 VIEW COMPARISON:  08/19/2020 FINDINGS: Borderline heart size. Blunting at the lateral left costophrenic sulcus that is stable and most likely from mediastinal fat. Reticulation of lung markings. There is signs of chronic indolent infection/scarring in the lower lungs on recent abdominal CT. There is no edema, consolidation, effusion, or pneumothorax. IMPRESSION: Stable from prior.  No acute finding. Electronically Signed   By: Monte Fantasia M.D.   On: 08/28/2020 07:05   EEG adult  Result Date: 08/28/2020 Lora Havens, MD     08/28/2020 12:15 PM Patient Name: Kara Griffin MRN: [id number] Epilepsy Attending: Lora Havens Referring Physician/Provider: Dr Antony Contras Date: 08/28/2020 Duration: 24.43 mins Patient history: 85yo F with R basal ganglia infarct and cognitive impairment. EEG to evaluate for seizure. Level of alertness: Awake AEDs during EEG study: None Technical aspects:  This EEG study was done with scalp electrodes positioned according to the 10-20 International system of electrode placement. Electrical activity was acquired at a sampling rate of 500Hz  and reviewed with a high frequency filter of 70Hz  and a low frequency filter of 1Hz . EEG data were recorded continuously and digitally stored. Description: The posterior dominant rhythm consists of 8 Hz activity of moderate voltage (25-35 uV) seen predominantly in posterior head regions, symmetric and reactive to eye opening and eye closing. EEG showed intermittent generalized 3 to 6 Hz theta-delta slowing.  Hyperventilation and photic stimulation were not performed.   ABNORMALITY -Intermittent slow, generalized IMPRESSION: This study is suggestive of mild diffuse encephalopathy, nonspecific etiology. No seizures or epileptiform discharges were seen throughout the recording. Lora Havens   ECHOCARDIOGRAM COMPLETE  Result Date: 08/28/2020    ECHOCARDIOGRAM REPORT   Patient Name:   Kara Griffin Date of Exam: 08/28/2020 Medical Rec #:  [id number]              Height:       61.0 in Accession #:    [id number]             Weight:       159.0 lb Date of Birth:  October 06, 1935              BSA:          1.713 m Patient Age:    69 years  BP:           118/49 mmHg Patient Gender: F                      HR:           88 bpm. Exam Location:  Inpatient Procedure: 2D Echo, Cardiac Doppler and Color Doppler Indications:    Stroke 434.91 / I163.9  History:        Patient has no prior history of Echocardiogram examinations.                 Risk Factors:Hypertension and Diabetes.  Sonographer:    Bernadene Person RDCS Referring Phys: Eunice  1. Left ventricular ejection fraction, by estimation, is >75%. The left ventricle has normal function. The left ventricle has no regional wall motion abnormalities. Left ventricular diastolic parameters are consistent with Grade I diastolic dysfunction  (impaired relaxation).  2. Right ventricular systolic function is normal. The right ventricular size is normal. There is mildly elevated pulmonary artery systolic pressure.  3. The mitral valve is normal in structure. No evidence of mitral valve regurgitation. No evidence of mitral stenosis.  4. The aortic valve is tricuspid. There is mild calcification of the aortic valve. There is mild thickening of the aortic valve. Aortic valve regurgitation is not visualized. No aortic stenosis is present.  5. The inferior vena cava is normal in size with greater than 50% respiratory variability, suggesting right atrial pressure of 3 mmHg. FINDINGS  Left Ventricle: Left ventricular ejection fraction, by estimation, is >75%. The left ventricle has normal function. The left ventricle has no regional wall motion abnormalities. The left ventricular internal cavity size was normal in size. There is no left ventricular hypertrophy. Left ventricular diastolic parameters are consistent with Grade I diastolic dysfunction (impaired relaxation). Normal left ventricular filling pressure. Right Ventricle: The right ventricular size is normal. No increase in right ventricular wall thickness. Right ventricular systolic function is normal. There is mildly elevated pulmonary artery systolic pressure. The tricuspid regurgitant velocity is 2.96  m/s, and with an assumed right atrial pressure of 3 mmHg, the estimated right ventricular systolic pressure is 68.1 mmHg. Left Atrium: Left atrial size was normal in size. Right Atrium: Right atrial size was normal in size. Pericardium: There is no evidence of pericardial effusion. Mitral Valve: The mitral valve is normal in structure. No evidence of mitral valve regurgitation. No evidence of mitral valve stenosis. Tricuspid Valve: The tricuspid valve is normal in structure. Tricuspid valve regurgitation is mild . No evidence of tricuspid stenosis. Aortic Valve: The aortic valve is tricuspid. There is  mild calcification of the aortic valve. There is mild thickening of the aortic valve. There is mild aortic valve annular calcification. Aortic valve regurgitation is not visualized. No aortic stenosis  is present. Aortic valve mean gradient measures 5.4 mmHg. Aortic valve peak gradient measures 11.3 mmHg. Aortic valve area, by VTI measures 2.52 cm. Pulmonic Valve: The pulmonic valve was not well visualized. Pulmonic valve regurgitation is not visualized. No evidence of pulmonic stenosis. Aorta: The aortic root is normal in size and structure. Pulmonary Artery: 35. Venous: The inferior vena cava is normal in size with greater than 50% respiratory variability, suggesting right atrial pressure of 3 mmHg. IAS/Shunts: No atrial level shunt detected by color flow Doppler.  LEFT VENTRICLE PLAX 2D LVIDd:         4.00 cm  Diastology LVIDs:  1.90 cm  LV e' medial:    7.34 cm/s LV PW:         1.00 cm  LV E/e' medial:  11.9 LV IVS:        1.00 cm  LV e' lateral:   6.09 cm/s LVOT diam:     2.10 cm  LV E/e' lateral: 14.4 LV SV:         72 LV SV Index:   42 LVOT Area:     3.46 cm  RIGHT VENTRICLE RV S prime:     18.10 cm/s TAPSE (M-mode): 1.9 cm LEFT ATRIUM             Index       RIGHT ATRIUM           Index LA diam:        2.60 cm 1.52 cm/m  RA Area:     12.40 cm LA Vol (A2C):   32.6 ml 19.03 ml/m RA Volume:   28.30 ml  16.52 ml/m LA Vol (A4C):   42.1 ml 24.57 ml/m LA Biplane Vol: 36.8 ml 21.48 ml/m  AORTIC VALVE AV Area (Vmax):    2.61 cm AV Area (Vmean):   2.72 cm AV Area (VTI):     2.52 cm AV Vmax:           168.33 cm/s AV Vmean:          109.711 cm/s AV VTI:            0.286 m AV Peak Grad:      11.3 mmHg AV Mean Grad:      5.4 mmHg LVOT Vmax:         127.00 cm/s LVOT Vmean:        86.300 cm/s LVOT VTI:          0.208 m LVOT/AV VTI ratio: 0.73  AORTA Ao Root diam: 3.00 cm Ao Asc diam:  3.00 cm MITRAL VALVE                TRICUSPID VALVE MV Area (PHT): 3.77 cm     TR Peak grad:   35.0 mmHg MV Decel Time:  201 msec     TR Vmax:        296.00 cm/s MV E velocity: 87.50 cm/s MV A velocity: 158.00 cm/s  SHUNTS MV E/A ratio:  0.55         Systemic VTI:  0.21 m                             Systemic Diam: 2.10 cm Carlyle Dolly MD Electronically signed by Carlyle Dolly MD Signature Date/Time: 08/28/2020/8:53:08 AM    Final    CT RENAL STONE STUDY  Result Date: 08/28/2020 CLINICAL DATA:  Flank pain and fevers EXAM: CT ABDOMEN AND PELVIS WITHOUT CONTRAST TECHNIQUE: Multidetector CT imaging of the abdomen and pelvis was performed following the standard protocol without IV contrast. COMPARISON:  08/19/2020 FINDINGS: Lower chest: Lung bases again demonstrates some bronchiectatic changes in the right middle lobe and lingula similar to that seen on prior study. No new focal infiltrate is seen. Hepatobiliary: No focal liver abnormality is seen. No gallstones, gallbladder wall thickening, or biliary dilatation. Pancreas: Unremarkable. No pancreatic ductal dilatation or surrounding inflammatory changes. Spleen: Normal in size without focal abnormality. Adrenals/Urinary Tract: Adrenal glands are within normal limits. Kidneys are well visualized bilaterally. No renal calculi or obstructive changes are seen. Small cyst  is noted in the upper pole of the right kidney. The bladder is well distended. Stomach/Bowel: The appendix is not well visualized and may have been surgically removed. No obstructive or inflammatory changes of the large or small bowel are seen. Stomach is decompressed. Vascular/Lymphatic: Aortic atherosclerosis. No enlarged abdominal or pelvic lymph nodes. Reproductive: Status post hysterectomy. No adnexal masses. Other: No abdominal wall hernia or abnormality. No abdominopelvic ascites. Musculoskeletal: No acute or significant osseous findings. IMPRESSION: Changes similar to that seen on prior exam. No acute abnormality is noted. Electronically Signed   By: Inez Catalina M.D.   On: 08/28/2020 09:15   VAS US  CAROTID (at [facility] and [facility] only)  Result Date: 08/29/2020 Carotid Arterial Duplex Study Indications:  Recent fall with progressive decline of mental status. Risk Factors: Hypertension, hyperlipidemia, Diabetes. Performing Technologist: Rogelia Rohrer  Examination Guidelines: A complete evaluation includes B-mode imaging, spectral Doppler, color Doppler, and power Doppler as needed of all accessible portions of each vessel. Bilateral testing is considered an integral part of a complete examination. Limited examinations for reoccurring indications may be performed as noted.  Right Carotid Findings: +----------+--------+--------+--------+------------------+--------+           PSV cm/sEDV cm/sStenosisPlaque DescriptionComments +----------+--------+--------+--------+------------------+--------+ CCA Prox  97      5                                          +----------+--------+--------+--------+------------------+--------+ CCA Distal122     28                                         +----------+--------+--------+--------+------------------+--------+ ICA Prox  87      23      1-39%                              +----------+--------+--------+--------+------------------+--------+ ECA       112     0                                          +----------+--------+--------+--------+------------------+--------+ +----------+--------+-------+----------------+-------------------+           PSV cm/sEDV cmsDescribe        Arm Pressure (mmHG) +----------+--------+-------+----------------+-------------------+ [id number]     0      Multiphasic, WNL                    +----------+--------+-------+----------------+-------------------+ +---------+--------+--+--------+--+---------+ VertebralPSV cm/s58EDV cm/s10Antegrade +---------+--------+--+--------+--+---------+  Left Carotid Findings: +----------+--------+--------+--------+------------------+--------+           PSV cm/sEDV  cm/sStenosisPlaque DescriptionComments +----------+--------+--------+--------+------------------+--------+ CCA Prox  104     0                                          +----------+--------+--------+--------+------------------+--------+ CCA Distal100     12                                         +----------+--------+--------+--------+------------------+--------+ ICA Prox  53  12      1-39%                              +----------+--------+--------+--------+------------------+--------+ ICA Distal77      17                                         +----------+--------+--------+--------+------------------+--------+ ECA       63      0                                          +----------+--------+--------+--------+------------------+--------+ +----------+--------+--------+----------------+-------------------+           PSV cm/sEDV cm/sDescribe        Arm Pressure (mmHG) +----------+--------+--------+----------------+-------------------+ [id number]      0       Multiphasic, WNL                    +----------+--------+--------+----------------+-------------------+ +---------+--------+--+--------+--+---------+ VertebralPSV cm/s65EDV cm/s17Antegrade +---------+--------+--+--------+--+---------+   Summary: Right Carotid: Velocities in the right ICA are consistent with a 1-39% stenosis. Left Carotid: Velocities in the left ICA are consistent with a 1-39% stenosis. Vertebrals:  Bilateral vertebral arteries demonstrate antegrade flow. Subclavians: Normal flow hemodynamics were seen in bilateral subclavian              arteries. *See table(s) above for measurements and observations.  Electronically signed by Servando Snare MD on 08/29/2020 at 7:59:00 AM.    Final    VAS [place] LOWER EXTREMITY VENOUS (DVT)  Result Date: 09/03/2020  Lower Venous DVT Study Other Indications: CVA (cryptogenic). Performing Technologist: Rogelia Rohrer  Examination Guidelines: A complete  evaluation includes B-mode imaging, spectral Doppler, color Doppler, and power Doppler as needed of all accessible portions of each vessel. Bilateral testing is considered an integral part of a complete examination. Limited examinations for reoccurring indications may be performed as noted. The reflux portion of the exam is performed with the patient in reverse Trendelenburg.  +---------+---------------+---------+-----------+----------+--------------+ RIGHT    CompressibilityPhasicitySpontaneityPropertiesThrombus Aging +---------+---------------+---------+-----------+----------+--------------+ CFV      Full           Yes      Yes                                 +---------+---------------+---------+-----------+----------+--------------+ SFJ      Full                                                        +---------+---------------+---------+-----------+----------+--------------+ FV Prox  Full           Yes      Yes                                 +---------+---------------+---------+-----------+----------+--------------+ FV Mid   Full           Yes      Yes                                 +---------+---------------+---------+-----------+----------+--------------+  FV DistalFull           Yes      Yes                                 +---------+---------------+---------+-----------+----------+--------------+ PFV      Full                                                        +---------+---------------+---------+-----------+----------+--------------+ POP      Full           Yes      Yes                                 +---------+---------------+---------+-----------+----------+--------------+ PTV      Full                                                        +---------+---------------+---------+-----------+----------+--------------+ PERO     Full                                                         +---------+---------------+---------+-----------+----------+--------------+   +---------+---------------+---------+-----------+----------+--------------+ LEFT     CompressibilityPhasicitySpontaneityPropertiesThrombus Aging +---------+---------------+---------+-----------+----------+--------------+ CFV      Full           Yes      Yes                                 +---------+---------------+---------+-----------+----------+--------------+ SFJ      Full                                                        +---------+---------------+---------+-----------+----------+--------------+ FV Prox  Full           Yes      Yes                                 +---------+---------------+---------+-----------+----------+--------------+ FV Mid   Full           Yes      Yes                                 +---------+---------------+---------+-----------+----------+--------------+ FV DistalFull           Yes      Yes                                 +---------+---------------+---------+-----------+----------+--------------+ PFV      Full                                                        +---------+---------------+---------+-----------+----------+--------------+  POP      Full           Yes      Yes                                 +---------+---------------+---------+-----------+----------+--------------+ PTV      Full                                                        +---------+---------------+---------+-----------+----------+--------------+ PERO     Full                                                        +---------+---------------+---------+-----------+----------+--------------+     Summary: RIGHT: - There is no evidence of deep vein thrombosis in the lower extremity.  - No cystic structure found in the popliteal fossa.  LEFT: - There is no evidence of deep vein thrombosis in the lower extremity.  - No cystic structure found in the popliteal fossa.   *See table(s) above for measurements and observations. Electronically signed by Servando Snare MD on 09/03/2020 at 8:20:27 PM.    Final    US Abdomen Limited RUQ (LIVER/GB)  Result Date: 08/28/2020 CLINICAL DATA:  Nausea and vomiting. EXAM: ULTRASOUND ABDOMEN LIMITED RIGHT UPPER QUADRANT COMPARISON:  CT scan 08/19/2020 FINDINGS: Gallbladder: No gallstones or gallbladder wall thickening. No pericholecystic fluid. The sonographer reports no sonographic Murphy's sign. Common bile duct: Diameter: 1-2 mm Liver: No focal lesion identified. Within normal limits in parenchymal echogenicity. Portal vein is patent on color Doppler imaging with normal direction of blood flow towards the liver. Other: None. IMPRESSION: No acute findings. No evidence to explain the patient's history of nausea and vomiting. Electronically Signed   By: Misty Stanley M.D.   On: 08/28/2020 08:04    Microbiology: Recent Results (from the past 240 hour(s))  Culture, Urine     Status: Abnormal   Collection Time: 09/02/2020  6:56 PM   Specimen: Urine, Random  Result Value Ref Range Status   Specimen Description URINE, RANDOM  Final   Special Requests   Final    NONE Performed at [facility], [address] [place], [place] [address]    Culture MULTIPLE SPECIES PRESENT, SUGGEST RECOLLECTION (A)  Final   Report Status 08/29/2020 FINAL  Final  Resp Panel by RT-PCR (Flu A&B, Covid) Nasopharyngeal Swab     Status: None   Collection Time: 09/03/2020 10:15 PM   Specimen: Nasopharyngeal Swab; Nasopharyngeal(NP) swabs in vial transport medium  Result Value Ref Range Status   SARS Coronavirus 2 by RT PCR NEGATIVE NEGATIVE Final    Comment: (NOTE) SARS-CoV-2 target nucleic acids are NOT DETECTED.  The SARS-CoV-2 RNA is generally detectable in upper respiratory specimens during the acute phase of infection. The lowest concentration of SARS-CoV-2 viral copies this assay can detect is 138 copies/mL. A negative result does not  preclude SARS-Cov-2 infection and should not be used as the sole basis for treatment or other patient management decisions. A negative result may occur with  improper specimen collection/handling, submission of specimen other than nasopharyngeal swab,  presence of viral mutation(s) within the areas targeted by this assay, and inadequate number of viral copies(<138 copies/mL). A negative result must be combined with clinical observations, patient history, and epidemiological information. The expected result is Negative.  Fact Sheet for Patients:  EntrepreneurPulse.com.au  Fact Sheet for Healthcare Providers:  IncredibleEmployment.be  This test is no t yet approved or cleared by the [place] FDA and  has been authorized for detection and/or diagnosis of SARS-CoV-2 by FDA under an Emergency Use Authorization (EUA). This EUA will remain  in effect (meaning this test can be used) for the duration of the COVID-19 declaration under Section 564(b)(1) of the Act, 21 U.S.C.section 360bbb-3(b)(1), unless the authorization is terminated  or revoked sooner.       Influenza A by PCR NEGATIVE NEGATIVE Final   Influenza B by PCR NEGATIVE NEGATIVE Final    Comment: (NOTE) The Xpert Xpress SARS-CoV-2/FLU/RSV plus assay is intended as an aid in the diagnosis of influenza from Nasopharyngeal swab specimens and should not be used as a sole basis for treatment. Nasal washings and aspirates are unacceptable for Xpert Xpress SARS-CoV-2/FLU/RSV testing.  Fact Sheet for Patients: EntrepreneurPulse.com.au  Fact Sheet for Healthcare Providers: IncredibleEmployment.be  This test is not yet approved or cleared by the [place] FDA and has been authorized for detection and/or diagnosis of SARS-CoV-2 by FDA under an Emergency Use Authorization (EUA). This EUA will remain in effect (meaning this test can be used) for the  duration of the COVID-19 declaration under Section 564(b)(1) of the Act, 21 U.S.C. section 360bbb-3(b)(1), unless the authorization is terminated or revoked.  Performed at [facility], [place] [address], [place], [place] [address]   Culture, blood (routine x 2)     Status: None   Collection Time: 08/28/20  4:26 AM   Specimen: BLOOD RIGHT HAND  Result Value Ref Range Status   Specimen Description BLOOD RIGHT HAND  Final   Special Requests   Final    BOTTLES DRAWN AEROBIC AND ANAEROBIC Blood Culture adequate volume   Culture   Final    NO GROWTH 5 DAYS Performed at [facility], [place] [address] [place], [place] [address]    Report Status 09/02/2020 FINAL  Final  Culture, blood (routine x 2)     Status: None   Collection Time: 08/28/20  4:35 AM   Specimen: BLOOD LEFT WRIST  Result Value Ref Range Status   Specimen Description BLOOD LEFT WRIST  Final   Special Requests   Final    BOTTLES DRAWN AEROBIC AND ANAEROBIC Blood Culture adequate volume   Culture   Final    NO GROWTH 5 DAYS Performed at [facility], [place] [address], [place], [place] [address]    Report Status 09/02/2020 FINAL  Final  Culture, Urine     Status: None   Collection Time: 08/29/20  7:35 AM   Specimen: Urine, Random  Result Value Ref Range Status   Specimen Description URINE, RANDOM  Final   Special Requests NONE  Final   Culture   Final    NO GROWTH Performed at [facility], [place] [address], [place], [place] [address]    Report Status 08/30/2020 FINAL  Final  Resp Panel by RT-PCR (Flu A&B, Covid) Nasopharyngeal Swab     Status: None   Collection Time: 08/31/20 12:10 PM   Specimen: Nasopharyngeal Swab; Nasopharyngeal(NP) swabs in vial transport medium  Result Value Ref Range Status   SARS Coronavirus  2 by RT PCR NEGATIVE NEGATIVE Final    Comment: (NOTE) SARS-CoV-2 target nucleic acids are NOT DETECTED.  The SARS-CoV-2 RNA is generally detectable in upper respiratory specimens  during the acute phase of infection. The lowest concentration of SARS-CoV-2 viral copies this assay can detect is 138 copies/mL. A negative result does not preclude SARS-Cov-2 infection and should not be used as the sole basis for treatment or other patient management decisions. A negative result may occur with  improper specimen collection/handling, submission of specimen other than nasopharyngeal swab, presence of viral mutation(s) within the areas targeted by this assay, and inadequate number of viral copies(<138 copies/mL). A negative result must be combined with clinical observations, patient history, and epidemiological information. The expected result is Negative.  Fact Sheet for Patients:  EntrepreneurPulse.com.au  Fact Sheet for Healthcare Providers:  IncredibleEmployment.be  This test is no t yet approved or cleared by the [place] FDA and  has been authorized for detection and/or diagnosis of SARS-CoV-2 by FDA under an Emergency Use Authorization (EUA). This EUA will remain  in effect (meaning this test can be used) for the duration of the COVID-19 declaration under Section 564(b)(1) of the Act, 21 U.S.C.section 360bbb-3(b)(1), unless the authorization is terminated  or revoked sooner.       Influenza A by PCR NEGATIVE NEGATIVE Final   Influenza B by PCR NEGATIVE NEGATIVE Final    Comment: (NOTE) The Xpert Xpress SARS-CoV-2/FLU/RSV plus assay is intended as an aid in the diagnosis of influenza from Nasopharyngeal swab specimens and should not be used as a sole basis for treatment. Nasal washings and aspirates are unacceptable for Xpert Xpress SARS-CoV-2/FLU/RSV testing.  Fact Sheet for Patients: EntrepreneurPulse.com.au  Fact Sheet for Healthcare Providers: IncredibleEmployment.be  This test is not yet approved or cleared by the [place] FDA and has been authorized for detection  and/or diagnosis of SARS-CoV-2 by FDA under an Emergency Use Authorization (EUA). This EUA will remain in effect (meaning this test can be used) for the duration of the COVID-19 declaration under Section 564(b)(1) of the Act, 21 U.S.C. section 360bbb-3(b)(1), unless the authorization is terminated or revoked.  Performed at [facility], [place] [address] [place], [place] [address]   SARS CORONAVIRUS 2 (TAT 6-24 HRS) Nasopharyngeal Nasopharyngeal Swab     Status: None   Collection Time: 09/01/20  4:38 PM   Specimen: Nasopharyngeal Swab  Result Value Ref Range Status   SARS Coronavirus 2 NEGATIVE NEGATIVE Final    Comment: (NOTE) SARS-CoV-2 target nucleic acids are NOT DETECTED.  The SARS-CoV-2 RNA is generally detectable in upper and lower respiratory specimens during the acute phase of infection. Negative results do not preclude SARS-CoV-2 infection, do not rule out co-infections with other pathogens, and should not be used as the sole basis for treatment or other patient management decisions. Negative results must be combined with clinical observations, patient history, and epidemiological information. The expected result is Negative.  Fact Sheet for Patients: SugarRoll.be  Fact Sheet for Healthcare Providers: https://www.woods-mathews.com/  This test is not yet approved or cleared by the [place] FDA and  has been authorized for detection and/or diagnosis of SARS-CoV-2 by FDA under an Emergency Use Authorization (EUA). This EUA will remain  in effect (meaning this test can be used) for the duration of the COVID-19 declaration under Se ction 564(b)(1) of the Act, 21 U.S.C. section 360bbb-3(b)(1), unless the authorization is terminated or revoked sooner.  Performed at [facility], [place]  [address] [place], [place] [address]   Resp Panel by RT-PCR (Flu A&B, Covid) Nasopharyngeal Swab     Status: None   Collection Time:  09/04/20 10:25 AM   Specimen: Nasopharyngeal Swab; Nasopharyngeal(NP) swabs in vial transport medium  Result Value Ref Range Status   SARS Coronavirus 2 by RT PCR NEGATIVE NEGATIVE Final    Comment: (NOTE) SARS-CoV-2 target nucleic acids are NOT DETECTED.  The SARS-CoV-2 RNA is generally detectable in upper respiratory specimens during the acute phase of infection. The lowest concentration of SARS-CoV-2 viral copies this assay can detect is 138 copies/mL. A negative result does not preclude SARS-Cov-2 infection and should not be used as the sole basis for treatment or other patient management decisions. A negative result may occur with  improper specimen collection/handling, submission of specimen other than nasopharyngeal swab, presence of viral mutation(s) within the areas targeted by this assay, and inadequate number of viral copies(<138 copies/mL). A negative result must be combined with clinical observations, patient history, and epidemiological information. The expected result is Negative.  Fact Sheet for Patients:  EntrepreneurPulse.com.au  Fact Sheet for Healthcare Providers:  IncredibleEmployment.be  This test is no t yet approved or cleared by the [place] FDA and  has been authorized for detection and/or diagnosis of SARS-CoV-2 by FDA under an Emergency Use Authorization (EUA). This EUA will remain  in effect (meaning this test can be used) for the duration of the COVID-19 declaration under Section 564(b)(1) of the Act, 21 U.S.C.section 360bbb-3(b)(1), unless the authorization is terminated  or revoked sooner.       Influenza A by PCR NEGATIVE NEGATIVE Final   Influenza B by PCR NEGATIVE NEGATIVE Final    Comment: (NOTE) The Xpert Xpress SARS-CoV-2/FLU/RSV plus assay is intended as an aid in the diagnosis of influenza from Nasopharyngeal swab specimens and should not be used as a sole basis for treatment. Nasal washings  and aspirates are unacceptable for Xpert Xpress SARS-CoV-2/FLU/RSV testing.  Fact Sheet for Patients: EntrepreneurPulse.com.au  Fact Sheet for Healthcare Providers: IncredibleEmployment.be  This test is not yet approved or cleared by the [place] FDA and has been authorized for detection and/or diagnosis of SARS-CoV-2 by FDA under an Emergency Use Authorization (EUA). This EUA will remain in effect (meaning this test can be used) for the duration of the COVID-19 declaration under Section 564(b)(1) of the Act, 21 U.S.C. section 360bbb-3(b)(1), unless the authorization is terminated or revoked.  Performed at [facility], [place] [address], [place], [place] [address]      Labs: Basic Metabolic Panel: Recent Labs  Lab 08/31/20 0246 09/02/20 0217 09/03/20 1017 09/04/20 0438 09/04/20 2145  NA 133* 132* 132* 135 133*  K 3.5 2.9* 3.6 3.7 3.7  CL 99 96* 98 99 97*  CO2 24 25 26 27 26   GLUCOSE 165* 152* 212* 162* 168*  BUN 9 8 9 9 13   CREATININE 0.64 0.61 0.57 0.66 0.59  CALCIUM 8.7* 8.7* 8.4* 8.7* 8.8*  MG  --   --  1.8  --   --    Liver Function Tests: Recent Labs  Lab 09/04/20 0438 09/04/20 2145  AST 9* 11*  ALT 13 12  ALKPHOS 65 65  BILITOT 0.5 0.4  PROT 5.4* 5.4*  ALBUMIN 2.8* 2.9*   No results for input(s): LIPASE, AMYLASE in the last 168 hours. No results for input(s): AMMONIA in the last 168 hours. CBC: Recent Labs  Lab 08/31/20 0246 09/02/20 0217 09/03/20 1017 09/04/20 0438 09/04/20 2145  WBC 2.3* 1.9* 1.7* 1.8* 1.7*  NEUTROABS  --   --  1.1* 1.2*  --   HGB 9.1* 9.4* 9.7* 9.6* 9.2*  HCT 27.7* 26.8* 27.2* 27.1* 26.0*  MCV 89.4 86.2 86.1 85.5 86.4  PLT 117* 116* 113* 113* 116*   Cardiac Enzymes: No results for input(s): CKTOTAL, CKMB, CKMBINDEX, TROPONINI in the last 168 hours. BNP: BNP (last 3 results) Recent Labs    08/19/20 1533  BNP 61.8    ProBNP (last 3 results) No results for input(s):  PROBNP in the last 8760 hours.  CBG: Recent Labs  Lab 09/04/20 1215 09/04/20 1712 09/04/20 1950 09/04/20 2124 09/05/20 0615  GLUCAP 245* 172* 150* 152* 168*       Signed:  Kayleen Memos, MD [facility] 09/05/2020, 11:15 AM

## 2020-09-05 NOTE — Progress Notes (Signed)
Patient was evaluated by speech therapist.  There is concern for esophageal spasm while she was drinking fluid during her assessment that took place today.  GI Dr. Chales Abrahams consulted via secure chat.

## 2020-09-05 NOTE — Evaluation (Signed)
Clinical/Bedside Swallow Evaluation Patient Details  Name: Kara Griffin MRN: [id number] Date of Birth: Oct 07, 1935  Today's Date: 09/05/2020 Time: SLP Start Time (ACUTE ONLY): 1100 SLP Stop Time (ACUTE ONLY): 1120 SLP Time Calculation (min) (ACUTE ONLY): 20 min  Past Medical History:  Past Medical History:  Diagnosis Date  . Anemia   . Diabetes mellitus ([place])   . Environmental allergies   . Hypercholesterolemia   . Hypertension   . Myelodysplastic syndrome ([place])    Sees Dr Jerrye Noble   Past Surgical History:  Past Surgical History:  Procedure Laterality Date  . ABDOMINAL HYSTERECTOMY    . [place]  . DILATION AND CURETTAGE OF UTERUS    . TONSILECTOMY, ADENOIDECTOMY, BILATERAL MYRINGOTOMY AND TUBES  1963  . TOTAL ABDOMINAL HYSTERECTOMY W/ BILATERAL SALPINGOOPHORECTOMY  1985   secondary to abnormal cells and abnormal uterine bleeding   HPI:  Kara Griffin is a 85 y.o. female with PMH significant for DM2, HTN, Myelodysplastic syndrome, HLD who is admitted with dizziness and falls that have been progressively worsening for the last month. She was brought in by family for worsening confusion and now in her bed all day x 2 days.Son reports the patient has had some symptoms of gastroenteritis with nausea, has not thrown up and some diarrhea. MRI brain demonstrated an acute right basal ganglia lacunar infarct. Neurologist feels Stroke is an incidental finding, unrelated to waxing and waning mentation.   Assessment / Plan / Recommendation Clinical Impression  Pt reevaluated after being seen in the ED upon admission when she was found to have normal swallow function. Today pt is lethargic, but able to open her eyes, greet me and help hold a cup to take a sip of water. Pt swallowed the water without signs of difficulty, similar in appearance to function the ED. However, a few seconds after two sips pt started gagging/hiccuping. She was able to breathe though  breathing was irregular, and nod that she might throw up though she did not. After about 30 seconds of the spasm like hiccups, pt calmed. Sons at bedside also reported that this is what she did after they gave her a sip of water earlier, which they had described as choking. Episode apeared silimar to an esophageal or diaphragmatic spasm. Sons report that in the days leading up to yesterday she had been eating well. In review of my prior note, pt had reported some GERD symptoms bothering her prior to admission and son confirmed decreased oral intake. reported findings to MD and RN. Will f/u tomorrow for PO trials. SLP Visit Diagnosis: Dysphagia, unspecified (R13.10)    Aspiration Risk  Moderate aspiration risk;Risk for inadequate nutrition/hydration    Diet Recommendation NPO        Other  Recommendations Recommended Consults: Consider GI evaluation Oral Care Recommendations: Oral care QID   Follow up Recommendations Skilled Nursing facility      Frequency and Duration min 2x/week  2 weeks       Prognosis        Swallow Study   General HPI: Kara Griffin is a 85 y.o. female with PMH significant for DM2, HTN, Myelodysplastic syndrome, HLD who is admitted with dizziness and falls that have been progressively worsening for the last month. She was brought in by family for worsening confusion and now in her bed all day x 2 days.Son reports the patient has had some symptoms of gastroenteritis with nausea, has not thrown up and some diarrhea. MRI brain  demonstrated an acute right basal ganglia lacunar infarct. Neurologist feels Stroke is an incidental finding, unrelated to waxing and waning mentation. Type of Study: Bedside Swallow Evaluation Diet Prior to this Study: NPO Temperature Spikes Noted: No Respiratory Status: Room air History of Recent Intubation: No Behavior/Cognition: Lethargic/Drowsy;Distractible Oral Cavity Assessment: Dry Oral Care Completed by SLP: No (prt  refused) Oral Cavity - Dentition: Adequate natural dentition Vision: Impaired for self-feeding Self-Feeding Abilities: Needs assist Patient Positioning: Upright in bed Baseline Vocal Quality: Normal Volitional Cough: Strong    Oral/Motor/Sensory Function Overall Oral Motor/Sensory Function: Generalized oral weakness   Ice Chips Ice chips: Not tested   Thin Liquid Thin Liquid: Impaired Presentation: Cup Pharyngeal  Phase Impairments: Other (comments) (see impression)    Nectar Thick Nectar Thick Liquid: Not tested   Honey Thick Honey Thick Liquid: Not tested   Puree Puree: Not tested   Solid     Solid: Not tested     Harlon Ditty, MA CCC-SLP  Acute Rehabilitation Services Pager [phone] Office [phone]  Claudine Mouton 09/05/2020,11:32 AM

## 2020-09-05 DEATH — deceased

## 2020-09-06 DIAGNOSIS — E1165 Type 2 diabetes mellitus with hyperglycemia: Secondary | ICD-10-CM | POA: Diagnosis not present

## 2020-09-06 LAB — COMPREHENSIVE METABOLIC PANEL
ALT: 14 U/L (ref 0–44)
AST: 12 U/L — ABNORMAL LOW (ref 15–41)
Albumin: 3 g/dL — ABNORMAL LOW (ref 3.5–5.0)
Alkaline Phosphatase: 62 U/L (ref 38–126)
Anion gap: 11 (ref 5–15)
BUN: 15 mg/dL (ref 8–23)
CO2: 24 mmol/L (ref 22–32)
Calcium: 8.6 mg/dL — ABNORMAL LOW (ref 8.9–10.3)
Chloride: 99 mmol/L (ref 98–111)
Creatinine, Ser: 0.64 mg/dL (ref 0.44–1.00)
GFR, Estimated: >60 mL/min (ref >60)
Glucose, Bld: 167 mg/dL — ABNORMAL HIGH (ref 70–99)
Potassium: 3.3 mmol/L — ABNORMAL LOW (ref 3.5–5.1)
Sodium: 134 mmol/L — ABNORMAL LOW (ref 135–145)
Total Bilirubin: 0.6 mg/dL (ref 0.3–1.2)
Total Protein: 5.6 g/dL — ABNORMAL LOW (ref 6.5–8.1)

## 2020-09-06 LAB — GLUCOSE, CAPILLARY
Glucose-Capillary: 164 mg/dL — ABNORMAL HIGH (ref 70–99)
Glucose-Capillary: 174 mg/dL — ABNORMAL HIGH (ref 70–99)
Glucose-Capillary: 120 mg/dL — ABNORMAL HIGH (ref 70–99)

## 2020-09-06 LAB — URINE CULTURE: Culture: 40000 — AB

## 2020-09-06 MED ORDER — POTASSIUM CHLORIDE 10 MEQ/100ML IV SOLN
10.0000 meq | INTRAVENOUS | Status: AC
  Administered 2020-09-06 (×2): 10 meq via INTRAVENOUS
  Filled 2020-09-06 (×2): qty 100

## 2020-09-06 NOTE — Progress Notes (Signed)
Physical Therapy Treatment Patient Details Name: Kara Griffin MRN: [id number] DOB: 05-07-1936 Today's Date: 09/06/2020    History of Present Illness 85 y.o. female with history of myelodysplastic syndrome being followed by Dr. Lowell Guitar at [facility] on Revlimid, pancytopenia, diabetes mellitus, hypertension and hyperlipidemia was brought to the ED by patient's son after patient was found to be increasingly confused last 2 days. On August 19, 2020, patient was taken to [facility] after patient had a fall at home. CT and xray were negative during that ED visit and pt was d/c'd home. Pt underwent MRI 12/23 which revealed small R bagal ganglia lacunar infarct. MRI 09/02/20 revealed newly seen 5-7 punctate foci of acute infarctions scattered  within the inferior cerebellum on both sides. No large confluent  infarction. Being a different vascular territory, this suggests  embolic disease from the heart or ascending aorta.    PT Comments    Pt is lethargic during session, often maintaining eyes closed but able to open on command. Pt requires increased time to respond to command. Pt with strong posterior lean at edge of bed, unable to maintain upright posture even with BUE support of bed. Pt requires max-totalA for all functional mobility due to weakness and imbalance. Pt's son provided education on maintaning lights on and keeping the pt stimulated and engaged during the day, allowing for rest at night to prevent delirium. Pt will continue to benefit from acute PT POC to reduce caregiver burden and falls risk. PT continues to recommend SNF placement.  Follow Up Recommendations  SNF;Supervision/Assistance - 24 hour     Equipment Recommendations  Wheelchair (measurements PT);Wheelchair cushion (measurements PT);Hospital bed (mechanical llift)    Recommendations for Other Services       Precautions / Restrictions Precautions Precautions: Fall Restrictions Weight Bearing Restrictions: No     Mobility  Bed Mobility Overal bed mobility: Needs Assistance Bed Mobility: Supine to Sit;Sit to Supine     Supine to sit: Total assist Sit to supine: Total assist      Transfers Overall transfer level: Needs assistance Equipment used: 1 person hand held assist Transfers: Sit to/from Stand Sit to Stand: Max assist         General transfer comment: PT must initiate all mobility, significant cueing for anterior trunk lean  Ambulation/Gait Ambulation/Gait assistance:  (deferred 2/2 safety concerns)               Stairs             Wheelchair Mobility    Modified Rankin (Stroke Patients Only) Modified Rankin (Stroke Patients Only) Pre-Morbid Rankin Score: No significant disability Modified Rankin: Severe disability     Balance Overall balance assessment: Needs assistance Sitting-balance support: Single extremity supported;Bilateral upper extremity supported Sitting balance-Leahy Scale: Zero Sitting balance - Comments: mod-maxA due to posterior lean Postural control: Posterior lean Standing balance support: Bilateral upper extremity supported Standing balance-Leahy Scale: Zero Standing balance comment: maxA with BUE support of PT                            Cognition Arousal/Alertness: Lethargic Behavior During Therapy: Anxious Overall Cognitive Status: Impaired/Different from baseline Area of Impairment: Orientation;Attention;Memory;Following commands;Safety/judgement;Awareness;Problem solving                 Orientation Level: Disoriented to;Place Current Attention Level: Focused Memory: Decreased recall of precautions;Decreased short-term memory Following Commands: Follows one step commands with increased time Safety/Judgement: Decreased awareness of safety;Decreased awareness  of deficits Awareness: Intellectual Problem Solving: Slow processing;Decreased initiation;Difficulty sequencing;Requires verbal cues;Requires tactile  cues        Exercises      General Comments General comments (skin integrity, edema, etc.): VSS, pt resistant at times to mobility, answering no to most PT inquiries and unable to report why she would not like to mobilize or stand      Pertinent Vitals/Pain Pain Assessment: Faces Faces Pain Scale: Hurts little more Pain Location: patient touching head and chest, indicating some pain Pain Descriptors / Indicators: Grimacing;Discomfort Pain Intervention(s): Monitored during session    Home Living                      Prior Function            PT Goals (current goals can now be found in the care plan section) Acute Rehab PT Goals Patient Stated Goal: to improve cognition and mobility Progress towards PT goals: Not progressing toward goals - comment (limited by cognitive status and lethargy)    Frequency    Min 3X/week      PT Plan Current plan remains appropriate    Co-evaluation              AM-PAC PT "6 Clicks" Mobility   Outcome Measure  Help needed turning from your back to your side while in a flat bed without using bedrails?: Total Help needed moving from lying on your back to sitting on the side of a flat bed without using bedrails?: Total Help needed moving to and from a bed to a chair (including a wheelchair)?: A Lot Help needed standing up from a chair using your arms (e.g., wheelchair or bedside chair)?: A Lot Help needed to walk in hospital room?: Total Help needed climbing 3-5 steps with a railing? : Total 6 Click Score: 8    End of Session Equipment Utilized During Treatment: Gait belt Activity Tolerance: Patient limited by lethargy Patient left: in bed;with call bell/phone within reach;with bed alarm set;with family/visitor present Nurse Communication: Mobility status;Need for lift equipment PT Visit Diagnosis: Unsteadiness on feet (R26.81);Other abnormalities of gait and mobility (R26.89);Muscle weakness (generalized)  (M62.81);Pain;Difficulty in walking, not elsewhere classified (R26.2) Pain - part of body:  (back and knee)     Time: [id number] PT Time Calculation (min) (ACUTE ONLY): 28 min  Charges:  $Therapeutic Activity: 23-37 mins                     Zenaida Niece, PT, DPT Acute Rehabilitation Pager: [phone]    Zenaida Niece 09/06/2020, 11:16 AM

## 2020-09-06 NOTE — Progress Notes (Signed)
  Speech Language Pathology Treatment: Dysphagia  Patient Details Name: Kara Griffin MRN: [id number] DOB: 09-Jan-1936 Today's Date: 09/06/2020 Time: 1330-1350 SLP Time Calculation (min) (ACUTE ONLY): 20 min  Assessment / Plan / Recommendation Clinical Impression  Patient seen in PM today (had been seen this morning as well) as son informed RN that she was more alert. SLP entered room and patient was indeed more alert with eyes opened and sitting upright. She is not able to consistently maintain alertness or attention and does seem to get frustrated easily. (When holding cup, she had it almost to lips with SLP helping guide it but when she started to have trouble finding her lips, she immediately stopped this attempt. Patient was able to consume thin liquids PO's, nectar thick liquids PO's and small amount of puree solids (applesauce). She exhibited immediate cough response with thin liquids, oral delays with puree solids but no overt s/s aspiration or penetration with nectar thick liquids and patient did seem to enjoy nectar thick apple juice. Patient is more alert but not consistent enough and so SLP is recommending floor stock nectar thick liquids and puree solids only and plan to check next date for PO readiness versus need for objective swallow evaluation.   HPI HPI: Kara Griffin is a 85 y.o. female with PMH significant for DM2, HTN, Myelodysplastic syndrome, HLD who is admitted with dizziness and falls that have been progressively worsening for the last month. She was brought in by family for worsening confusion and now in her bed all day x 2 days.Son reports the patient has had some symptoms of gastroenteritis with nausea, has not thrown up and some diarrhea. MRI brain demonstrated an acute right basal ganglia lacunar infarct. Neurologist feels Stroke is an incidental finding, unrelated to waxing and waning mentation.      SLP Plan  Continue with current plan of care        Recommendations  Diet recommendations: NPO;Other(comment) (NPO except floor stock nectar thick liquids and puree solids) Liquids provided via: Cup Medication Administration: Whole meds with puree Supervision: Full supervision/cueing for compensatory strategies Compensations: Minimize environmental distractions;Slow rate;Small sips/bites Postural Changes and/or Swallow Maneuvers: Seated upright 90 degrees                Oral Care Recommendations: Oral care QID Follow up Recommendations: Skilled Nursing facility SLP Visit Diagnosis: Dysphagia, unspecified (R13.10) Plan: Continue with current plan of care       GO                Angela Nevin, MA, CCC-SLP Speech Therapy [facility]

## 2020-09-06 NOTE — Plan of Care (Signed)
  Problem: Education: Goal: Knowledge of secondary prevention will improve Outcome: Progressing

## 2020-09-06 NOTE — Progress Notes (Signed)
PROGRESS NOTE    Kara Griffin  [name]  DOB: March 29, 1936  DOA: 08/08/2020 PCP: Einar Pheasant, MD Outpatient Specialists:   Hospital course:  She is an 85 year old female with MDS DM2, HTN who was admitted 08/19/2020 with acute basal ganglia lacunar infarct secondary to small vessel disease. She completed a stroke work-up. MRA brain no large vessel obstruction. Moderate L A1 stenosis. Carotid Doppler ultrasound unremarkable. 2D echo EF 75%, no source of embolus. EEG suggestive of mild diffuse encephalopathy, nonspecific etiology. No seizures or epileptiform discharges were seen throughout the recording. LDL 35. Hemoglobin A1c 6.0.  Seen by neurology/stroke team with recommendation for aspirin 81 mg daily and Plavix 75 mg daily for 3 weeks followed by aspirin alone. PT OT recommended SNF.  MRI brain was done on 09/02/2020 which showednew 5-7 punctate foci of acute infarctions scattered within the inferior cerebellum on both sides, suggestive of embolic disease from the heart or ascending aorta.Cardiology was contacted for arrangement of 30-day cardiac event monitoring outpatient.  She had a CT a head and neck completed which was unremarkable. Bilateral lower extremity venous Doppler ultrasound was negative for DVT. Neurology signed off. She will follow-up outpatient at [facility] in about 4 weeks.  Hospital course complicated by delirium with waxing and waning altered mental status.  Her sons strongly believes that she has a UTI.  Urine culture was negative.  She was given additional 3 days of antibiotics.  Lactic acid and procalcitonin negative.  Afebrile.  No evidence of active infective process.  DC'd antibiotics on 09/05/2020. Patient had a recurrence of altered mental status overnight.  CT head was done which was negative for any acute intracranial findings.  CBC with differential and BMP were ordered overnight which showed no significant difference from  previous.  Sons were very concerned about her mental status on day of discharge so patient was kept yesterday for further evaluation.  Subjective:  Patient herself appears confused although she does follow simple commands.  Patient son Tresa Scogin was at bedside.  I had a long conversation with them and his concerns.  His main concern was that patient needs to be restarted on her Revlimid.  He was also concerned that she continues to be confused.  We went through the differential diagnosis and work-up of delirium and discussed that it sometimes is a prolonged course.  Work-up of delirium including evaluation for infection and electrolyte abnormalities and medication changes is underway.   Objective: Vitals:   09/06/20 0329 09/06/20 0757 09/06/20 1331 09/06/20 1626  BP: (!) 158/66 (!) 159/55 (!) 172/66 (!) 168/57  Pulse: 77 (!) 59 76 77  Resp:  18 18   Temp: 98.2 F (36.8 C) 97.7 F (36.5 C) 98.1 F (36.7 C) 98.3 F (36.8 C)  TempSrc: Axillary Oral Oral Axillary  SpO2: 97% 100% 98% 99%    Intake/Output Summary (Last 24 hours) at 09/06/2020 1648 Last data filed at 09/06/2020 1629 Gross per 24 hour  Intake 238.39 ml  Output 1450 ml  Net -1211.61 ml   There were no vitals filed for this visit.   Exam:  General: Awake, alert, various, not very verbal although does respond to simple commands. Eyes: sclera anicteric, conjuctiva mild injection bilaterally CVS: S1-S2, regular  Respiratory:  decreased air entry bilaterally secondary to decreased inspiratory effort, rales at bases  GI: NABS, soft, NT  LE: No edema.  Neuro: Acutely delirious   Assessment & Plan:   Acute metabolic encephalopathy Most likely secondary to strokes which  may be ongoing.  Apparently was improving after her initial stroke and then woke up on the day of discharge and was acutely delirious again. Work-up for delirium including infectious work-up is negative.  UA is negative.  Patient's lungs are clear and  there is no evidence of oxygen requirement suggestive of pneumonia.  Patient abdomen is soft.  Laboratory data is unrevealing with normal sodium and creatinine.  She has not been started on any new medications that would cause delirium.  Again I suspect that patient may well be having multiple small infarcts secondary to known small vessel disease.  MDS Discussed with Dr. Burr Medico given worsening pancytopenia and patient not taking her Revlimid for the past 3 days secondary to some dysphagia per speech therapy. Dr. Burr Medico noted that Revlimid does cause a somewhat increased coagulable state however since patient is on multiple anticoagulants, she thought it made sense to restart the Revlimid.  This was discussed with patient's son who agreed.  Hypokalemia Will provide KCl IV x2 runs for potassium of 3.3 given difficulty swallowing.   DVT prophylaxis: Lovenox Code Status: Full Family Communication: Multiple conversations with patient's son Mari Mcdow Disposition Plan:   Patient is from: Home  Anticipated Discharge Location: SNF  Barriers to Discharge: Delirium work-up  Is patient medically stable for Discharge: Yes  Consultants:  Neurology  Procedures:  MRI/MRA brain  EEG  Carotid Doppler  CT renal study  Right upper quadrant ultrasound  Antimicrobials:   Cefepime  Vancomycin  Rocephin    Data Reviewed:  Basic Metabolic Panel: Recent Labs  Lab 09/02/20 0217 09/03/20 1017 09/04/20 0438 09/04/20 2145 09/06/20 1008  NA 132* 132* 135 133* 134*  K 2.9* 3.6 3.7 3.7 3.3*  CL 96* 98 99 97* 99  CO2 25 26 27 26 24   GLUCOSE 152* 212* 162* 168* 167*  BUN 8 9 9 13 15   CREATININE 0.61 0.57 0.66 0.59 0.64  CALCIUM 8.7* 8.4* 8.7* 8.8* 8.6*  MG  --  1.8  --   --   --    Liver Function Tests: Recent Labs  Lab 09/04/20 0438 09/04/20 2145 09/06/20 1008  AST 9* 11* 12*  ALT 13 12 14   ALKPHOS 65 65 62  BILITOT 0.5 0.4 0.6  PROT 5.4* 5.4* 5.6*  ALBUMIN 2.8* 2.9*  3.0*   No results for input(s): LIPASE, AMYLASE in the last 168 hours. No results for input(s): AMMONIA in the last 168 hours. CBC: Recent Labs  Lab 09/02/20 0217 09/03/20 1017 09/04/20 0438 09/04/20 2145 09/06/20 1008  WBC 1.9* 1.7* 1.8* 1.7* 1.4*  NEUTROABS  --  1.1* 1.2*  --  0.9*  HGB 9.4* 9.7* 9.6* 9.2* 10.1*  HCT 26.8* 27.2* 27.1* 26.0* 29.2*  MCV 86.2 86.1 85.5 86.4 86.6  PLT 116* 113* 113* 116* 93*   Cardiac Enzymes: No results for input(s): CKTOTAL, CKMB, CKMBINDEX, TROPONINI in the last 168 hours. BNP (last 3 results) No results for input(s): PROBNP in the last 8760 hours. CBG: Recent Labs  Lab 09/05/20 1708 09/05/20 2148 09/06/20 0623 09/06/20 1340 09/06/20 1624  GLUCAP 153* 163* 164* 174* 120*    Recent Results (from the past 240 hour(s))  Culture, Urine     Status: Abnormal   Collection Time: 08/12/2020  6:56 PM   Specimen: Urine, Random  Result Value Ref Range Status   Specimen Description URINE, RANDOM  Final   Special Requests   Final    NONE Performed at [facility], [place]  [address], [place], [place] [address]    Culture MULTIPLE SPECIES PRESENT, SUGGEST RECOLLECTION (A)  Final   Report Status 08/29/2020 FINAL  Final  Resp Panel by RT-PCR (Flu A&B, Covid) Nasopharyngeal Swab     Status: None   Collection Time: 08/19/2020 10:15 PM   Specimen: Nasopharyngeal Swab; Nasopharyngeal(NP) swabs in vial transport medium  Result Value Ref Range Status   SARS Coronavirus 2 by RT PCR NEGATIVE NEGATIVE Final    Comment: (NOTE) SARS-CoV-2 target nucleic acids are NOT DETECTED.  The SARS-CoV-2 RNA is generally detectable in upper respiratory specimens during the acute phase of infection. The lowest concentration of SARS-CoV-2 viral copies this assay can detect is 138 copies/mL. A negative result does not preclude SARS-Cov-2 infection and should not be used as the sole basis for treatment or other patient management decisions. A negative result may occur  with  improper specimen collection/handling, submission of specimen other than nasopharyngeal swab, presence of viral mutation(s) within the areas targeted by this assay, and inadequate number of viral copies(<138 copies/mL). A negative result must be combined with clinical observations, patient history, and epidemiological information. The expected result is Negative.  Fact Sheet for Patients:  EntrepreneurPulse.com.au  Fact Sheet for Healthcare Providers:  IncredibleEmployment.be  This test is no t yet approved or cleared by the [place] FDA and  has been authorized for detection and/or diagnosis of SARS-CoV-2 by FDA under an Emergency Use Authorization (EUA). This EUA will remain  in effect (meaning this test can be used) for the duration of the COVID-19 declaration under Section 564(b)(1) of the Act, 21 U.S.C.section 360bbb-3(b)(1), unless the authorization is terminated  or revoked sooner.       Influenza A by PCR NEGATIVE NEGATIVE Final   Influenza B by PCR NEGATIVE NEGATIVE Final    Comment: (NOTE) The Xpert Xpress SARS-CoV-2/FLU/RSV plus assay is intended as an aid in the diagnosis of influenza from Nasopharyngeal swab specimens and should not be used as a sole basis for treatment. Nasal washings and aspirates are unacceptable for Xpert Xpress SARS-CoV-2/FLU/RSV testing.  Fact Sheet for Patients: EntrepreneurPulse.com.au  Fact Sheet for Healthcare Providers: IncredibleEmployment.be  This test is not yet approved or cleared by the [place] FDA and has been authorized for detection and/or diagnosis of SARS-CoV-2 by FDA under an Emergency Use Authorization (EUA). This EUA will remain in effect (meaning this test can be used) for the duration of the COVID-19 declaration under Section 564(b)(1) of the Act, 21 U.S.C. section 360bbb-3(b)(1), unless the authorization is terminated  or revoked.  Performed at [facility], [place] [address], [place], [place] [address]   Culture, blood (routine x 2)     Status: None   Collection Time: 08/28/20  4:26 AM   Specimen: BLOOD RIGHT HAND  Result Value Ref Range Status   Specimen Description BLOOD RIGHT HAND  Final   Special Requests   Final    BOTTLES DRAWN AEROBIC AND ANAEROBIC Blood Culture adequate volume   Culture   Final    NO GROWTH 5 DAYS Performed at [facility], [place] [address], [place], [place] [address]    Report Status 09/02/2020 FINAL  Final  Culture, blood (routine x 2)     Status: None   Collection Time: 08/28/20  4:35 AM   Specimen: BLOOD LEFT WRIST  Result Value Ref Range Status   Specimen Description BLOOD LEFT WRIST  Final   Special Requests   Final    BOTTLES DRAWN AEROBIC AND  ANAEROBIC Blood Culture adequate volume   Culture   Final    NO GROWTH 5 DAYS Performed at [facility], [place] [address] [place], [place] [address]    Report Status 09/02/2020 FINAL  Final  Culture, Urine     Status: None   Collection Time: 08/29/20  7:35 AM   Specimen: Urine, Random  Result Value Ref Range Status   Specimen Description URINE, RANDOM  Final   Special Requests NONE  Final   Culture   Final    NO GROWTH Performed at [facility], [place] [address] [place], [place] [address]    Report Status 08/30/2020 FINAL  Final  Resp Panel by RT-PCR (Flu A&B, Covid) Nasopharyngeal Swab     Status: None   Collection Time: 08/31/20 12:10 PM   Specimen: Nasopharyngeal Swab; Nasopharyngeal(NP) swabs in vial transport medium  Result Value Ref Range Status   SARS Coronavirus 2 by RT PCR NEGATIVE NEGATIVE Final    Comment: (NOTE) SARS-CoV-2 target nucleic acids are NOT DETECTED.  The SARS-CoV-2 RNA is generally detectable in upper respiratory specimens during the acute phase of infection. The lowest concentration of SARS-CoV-2 viral copies this assay can detect is 138 copies/mL. A negative result  does not preclude SARS-Cov-2 infection and should not be used as the sole basis for treatment or other patient management decisions. A negative result may occur with  improper specimen collection/handling, submission of specimen other than nasopharyngeal swab, presence of viral mutation(s) within the areas targeted by this assay, and inadequate number of viral copies(<138 copies/mL). A negative result must be combined with clinical observations, patient history, and epidemiological information. The expected result is Negative.  Fact Sheet for Patients:  EntrepreneurPulse.com.au  Fact Sheet for Healthcare Providers:  IncredibleEmployment.be  This test is no t yet approved or cleared by the [place] FDA and  has been authorized for detection and/or diagnosis of SARS-CoV-2 by FDA under an Emergency Use Authorization (EUA). This EUA will remain  in effect (meaning this test can be used) for the duration of the COVID-19 declaration under Section 564(b)(1) of the Act, 21 U.S.C.section 360bbb-3(b)(1), unless the authorization is terminated  or revoked sooner.       Influenza A by PCR NEGATIVE NEGATIVE Final   Influenza B by PCR NEGATIVE NEGATIVE Final    Comment: (NOTE) The Xpert Xpress SARS-CoV-2/FLU/RSV plus assay is intended as an aid in the diagnosis of influenza from Nasopharyngeal swab specimens and should not be used as a sole basis for treatment. Nasal washings and aspirates are unacceptable for Xpert Xpress SARS-CoV-2/FLU/RSV testing.  Fact Sheet for Patients: EntrepreneurPulse.com.au  Fact Sheet for Healthcare Providers: IncredibleEmployment.be  This test is not yet approved or cleared by the [place] FDA and has been authorized for detection and/or diagnosis of SARS-CoV-2 by FDA under an Emergency Use Authorization (EUA). This EUA will remain in effect (meaning this test can be used) for  the duration of the COVID-19 declaration under Section 564(b)(1) of the Act, 21 U.S.C. section 360bbb-3(b)(1), unless the authorization is terminated or revoked.  Performed at [facility], [place] [address] [place], [place] [address]   SARS CORONAVIRUS 2 (TAT 6-24 HRS) Nasopharyngeal Nasopharyngeal Swab     Status: None   Collection Time: 09/01/20  4:38 PM   Specimen: Nasopharyngeal Swab  Result Value Ref Range Status   SARS Coronavirus 2 NEGATIVE NEGATIVE Final    Comment: (NOTE) SARS-CoV-2 target nucleic acids are NOT DETECTED.  The SARS-CoV-2  RNA is generally detectable in upper and lower respiratory specimens during the acute phase of infection. Negative results do not preclude SARS-CoV-2 infection, do not rule out co-infections with other pathogens, and should not be used as the sole basis for treatment or other patient management decisions. Negative results must be combined with clinical observations, patient history, and epidemiological information. The expected result is Negative.  Fact Sheet for Patients: SugarRoll.be  Fact Sheet for Healthcare Providers: https://www.woods-mathews.com/  This test is not yet approved or cleared by the [place] FDA and  has been authorized for detection and/or diagnosis of SARS-CoV-2 by FDA under an Emergency Use Authorization (EUA). This EUA will remain  in effect (meaning this test can be used) for the duration of the COVID-19 declaration under Se ction 564(b)(1) of the Act, 21 U.S.C. section 360bbb-3(b)(1), unless the authorization is terminated or revoked sooner.  Performed at [facility], [place] [address], [place], [place] [address]   Resp Panel by RT-PCR (Flu A&B, Covid) Nasopharyngeal Swab     Status: None   Collection Time: 09/04/20 10:25 AM   Specimen: Nasopharyngeal Swab; Nasopharyngeal(NP) swabs in vial transport medium  Result Value Ref Range Status   SARS  Coronavirus 2 by RT PCR NEGATIVE NEGATIVE Final    Comment: (NOTE) SARS-CoV-2 target nucleic acids are NOT DETECTED.  The SARS-CoV-2 RNA is generally detectable in upper respiratory specimens during the acute phase of infection. The lowest concentration of SARS-CoV-2 viral copies this assay can detect is 138 copies/mL. A negative result does not preclude SARS-Cov-2 infection and should not be used as the sole basis for treatment or other patient management decisions. A negative result may occur with  improper specimen collection/handling, submission of specimen other than nasopharyngeal swab, presence of viral mutation(s) within the areas targeted by this assay, and inadequate number of viral copies(<138 copies/mL). A negative result must be combined with clinical observations, patient history, and epidemiological information. The expected result is Negative.  Fact Sheet for Patients:  EntrepreneurPulse.com.au  Fact Sheet for Healthcare Providers:  IncredibleEmployment.be  This test is no t yet approved or cleared by the [place] FDA and  has been authorized for detection and/or diagnosis of SARS-CoV-2 by FDA under an Emergency Use Authorization (EUA). This EUA will remain  in effect (meaning this test can be used) for the duration of the COVID-19 declaration under Section 564(b)(1) of the Act, 21 U.S.C.section 360bbb-3(b)(1), unless the authorization is terminated  or revoked sooner.       Influenza A by PCR NEGATIVE NEGATIVE Final   Influenza B by PCR NEGATIVE NEGATIVE Final    Comment: (NOTE) The Xpert Xpress SARS-CoV-2/FLU/RSV plus assay is intended as an aid in the diagnosis of influenza from Nasopharyngeal swab specimens and should not be used as a sole basis for treatment. Nasal washings and aspirates are unacceptable for Xpert Xpress SARS-CoV-2/FLU/RSV testing.  Fact Sheet for  Patients: EntrepreneurPulse.com.au  Fact Sheet for Healthcare Providers: IncredibleEmployment.be  This test is not yet approved or cleared by the [place] FDA and has been authorized for detection and/or diagnosis of SARS-CoV-2 by FDA under an Emergency Use Authorization (EUA). This EUA will remain in effect (meaning this test can be used) for the duration of the COVID-19 declaration under Section 564(b)(1) of the Act, 21 U.S.C. section 360bbb-3(b)(1), unless the authorization is terminated or revoked.  Performed at [facility], [place] [address] [place], [place] [address]   Culture, Urine     Status: Abnormal  Collection Time: 09/04/20  8:20 PM   Specimen: Urine, Random  Result Value Ref Range Status   Specimen Description URINE, RANDOM  Final   Special Requests   Final    NONE Performed at [facility], [address], [place], [place] [address]    Culture 40,000 COLONIES/mL YEAST (A)  Final   Report Status 09/06/2020 FINAL  Final      Studies: CT HEAD WO CONTRAST  Result Date: 09/04/2020 CLINICAL DATA:  Cerebral infarction, follow-up examination EXAM: CT HEAD WITHOUT CONTRAST TECHNIQUE: Contiguous axial images were obtained from the base of the skull through the vertex without intravenous contrast. COMPARISON:  MRI 09/02/2020 FINDINGS: Brain: Normal anatomic configuration. Parenchymal volume loss is commensurate with the patient's age. Mild periventricular white matter changes are present likely reflecting the sequela of small vessel ischemia. The known punctate infarcts within the right putamen and cerebellum are not well appreciated on this examination. No abnormal intra or extra-axial mass lesion or fluid collection. No abnormal mass effect or midline shift. No evidence of acute intracranial hemorrhage or infarct. Ventricular size is normal. Cerebellum unremarkable. Vascular: No asymmetric hyperdense vasculature at the skull  base. Skull: Intact Sinuses/Orbits: Paranasal sinuses are clear. Orbits are unremarkable. Other: Mastoid air cells and middle ear cavities are clear. IMPRESSION: Known punctate infarcts within the right putamen and cerebellum noted on prior MRI examination are not well appreciated on this examination. No definite evidence of acute intracranial hemorrhage or infarct. Electronically Signed   By: Helyn Numbers MD   On: 09/04/2020 21:10     Scheduled Meds: . aspirin  81 mg Oral Daily  . clopidogrel  75 mg Oral Daily  . enoxaparin (LOVENOX) injection  40 mg Subcutaneous Q24H  . feeding supplement  237 mL Oral TID BM  . insulin aspart  0-9 Units Subcutaneous TID WC  . lenalidomide  10 mg Oral Daily  . LORazepam  0.5 mg Intravenous Once  . losartan  100 mg Oral Daily  . multivitamin with minerals  1 tablet Oral Daily  . pantoprazole (PROTONIX) IV  40 mg Intravenous Q12H  . polyethylene glycol  17 g Oral Daily  . rosuvastatin  10 mg Oral q1800  . senna-docusate  1 tablet Oral BID  . vitamin B-12  1,000 mcg Oral Daily   Continuous Infusions:  Principal Problem:   Acute encephalopathy Active Problems:   Myelodysplastic syndrome (HCC)   Diabetes mellitus (HCC)   Pancytopenia (HCC)   Cerebral thrombosis with cerebral infarction   Ischemic stroke (HCC)   Pressure injury of skin     Jannae Fagerstrom Tublu Ferrah Panagopoulos, [facility]  If 7PM-7AM, please contact night-coverage [email or web address] Password [email or web address] 09/06/2020, 4:48 PM    LOS: 9 days

## 2020-09-06 NOTE — Progress Notes (Signed)
  Speech Language Pathology Treatment: Dysphagia  Patient Details Name: Kara Griffin MRN: [id number] DOB: [birth year] Today's Date: 09/06/2020 Time: 0076-2263 SLP Time Calculation (min) (ACUTE ONLY): 20 min  Assessment / Plan / Recommendation Clinical Impression  Patient seen with son present at bedside for swallow treatment with SLP. Patient was alert but with eyes mostly closed and her attention was very poor. Spontaneous speech was very aphasic and patient unable to communicate even basic wants/needs. She did touch her head and chest and would grimace, seeming to indicate she was having a headache. (son Kara Griffin reported she recently had some pain medication via suppository). Patient allowed SLP to perform oral care with toothette sponge, with patient's oral mucosa appearing dry but no significant secretions/phlegm observed. Patient accepted a single, small ice chip but exhibited oral delays in transit, no observed mastication but one instance of swallow initiation observed by SLP. Patient is not alert and attentive enough to participate in objective swallow study (MBS). SLP will attempt to see patient in PM to determine if any positive change in alertness.   HPI HPI: Kara Griffin is a 85 y.o. female with PMH significant for DM2, HTN, Myelodysplastic syndrome, HLD who is admitted with dizziness and falls that have been progressively worsening for the last month. She was brought in by family for worsening confusion and now in her bed all day x 2 days.Son reports the patient has had some symptoms of gastroenteritis with nausea, has not thrown up and some diarrhea. MRI brain demonstrated an acute right basal ganglia lacunar infarct. Neurologist feels Stroke is an incidental finding, unrelated to waxing and waning mentation.      SLP Plan  Continue with current plan of care       Recommendations  Diet recommendations: NPO Medication Administration: Via alternative means                 Oral Care Recommendations: Oral care QID Follow up Recommendations: Skilled Nursing facility SLP Visit Diagnosis: Dysphagia, unspecified (R13.10) Plan: Continue with current plan of care       GO                Kara Nevin, MA, CCC-SLP Speech Therapy [facility]

## 2020-09-06 NOTE — Progress Notes (Signed)
CRITICAL WBC 1.4 called from lab,  Dr Gerome Apley was sent a text regarding results

## 2020-09-07 ENCOUNTER — Encounter (HOSPITAL_COMMUNITY): Payer: Self-pay | Admitting: Internal Medicine

## 2020-09-07 DIAGNOSIS — G9340 Encephalopathy, unspecified: Secondary | ICD-10-CM | POA: Diagnosis not present

## 2020-09-07 LAB — CBC WITH DIFFERENTIAL/PLATELET
Abs Immature Granulocytes: 0.05 10*3/uL (ref 0.00–0.07)
Basophils Absolute: 0 10*3/uL (ref 0.0–0.1)
Basophils Relative: 2 %
Eosinophils Absolute: 0 10*3/uL (ref 0.0–0.5)
Eosinophils Relative: 1 %
HCT: 29.2 % — ABNORMAL LOW (ref 36.0–46.0)
Hemoglobin: 10.1 g/dL — ABNORMAL LOW (ref 12.0–15.0)
Immature Granulocytes: 3 %
Lymphocytes Relative: 9 %
Lymphs Abs: 0.1 10*3/uL — ABNORMAL LOW (ref 0.7–4.0)
MCH: 30 pg (ref 26.0–34.0)
MCHC: 34.6 g/dL (ref 30.0–36.0)
MCV: 86.6 fL (ref 80.0–100.0)
Monocytes Absolute: 0.4 10*3/uL (ref 0.1–1.0)
Monocytes Relative: 25 %
Neutro Abs: 0.9 10*3/uL — ABNORMAL LOW (ref 1.7–7.7)
Neutrophils Relative %: 60 %
Platelets: 93 10*3/uL — ABNORMAL LOW (ref 150–400)
RBC: 3.37 MIL/uL — ABNORMAL LOW (ref 3.87–5.11)
RDW: 15.5 % (ref 11.5–15.5)
WBC: 1.4 10*3/uL — CL (ref 4.0–10.5)
nRBC: 0 % (ref 0.0–0.2)

## 2020-09-07 LAB — GLUCOSE, CAPILLARY
Glucose-Capillary: 133 mg/dL — ABNORMAL HIGH (ref 70–99)
Glucose-Capillary: 159 mg/dL — ABNORMAL HIGH (ref 70–99)
Glucose-Capillary: 123 mg/dL — ABNORMAL HIGH (ref 70–99)
Glucose-Capillary: 132 mg/dL — ABNORMAL HIGH (ref 70–99)

## 2020-09-07 LAB — PATHOLOGIST SMEAR REVIEW

## 2020-09-07 MED ORDER — MELATONIN 5 MG PO TABS: 5.0000 mg | ORAL_TABLET | Freq: Every day | ORAL | Status: DC

## 2020-09-07 NOTE — Progress Notes (Signed)
Neurology Progress Note   Neuro asked to see pt again due to continued waxing and waning mental status.  Brief HPI:  Scarleth Brame is a 85 y.o. female with history of myelodysplastic syndrome being followed by Dr. Lowell Guitar at [facility] on Revlim[place], pancytopenia, diabetes mellitus, hypertension and hyperlipidemia. Pt was brought to ED by son 10 days ago for increased confusion over 48 hour prior to admission. Son reported overall decline with more weakness and confusion since Jul 30, 2020. He also reported poor appetite beginning after a fall August 19, 2020.   Neurology was originally consulted on 08/28/20 when MRI brain showed right basal ganglia stroke. We also weighed in on potential neurological etiology for AMS.   Stroke and encephalopathy workup was completed which showed no findings of infection, normal echo without thrombus, WDL carotid US, normal ammonia, as well as, TSH, LA, procalcitonin, Vit B1 and B12 levels. Urine culture revealed 40,000 colonies of yeast, but no bacturemia. Blood cx x 2 neg. She has had slightly low sodium and K here, but K has been repleted and Na had climbed to 134. EEG was + for encephalopathy, but no seizures. No anion gap or acidosis noted on CMP. Glucose has been 162-168 over past 3 days. CXR and abd films neg. She has chronic pancytopenia due to MDS.   RUQ US revealed no gallstones or cholecystitis to explain n/v.   S: Pt denies n/v, HA, dizziness, or pain. She is unable to participate in complete ROS due to AMS. Per son, she is waxing and waning in mental status. Example, yesterday, she was agitated, and today she has been calm and sleepy.   O: Current vital signs: BP (!) 155/81 (BP Location: Right Arm)   Pulse 76   Temp 97.7 F (36.5 C) (Oral)   Resp 18   LMP 08/13/1984   SpO2 98%  Vital signs in last 24 hours: Temp:  [97.7 F (36.5 C)-98.6 F (37 C)] 97.7 F (36.5 C) (01/03 1223) Pulse Rate:  [55-82] 76 (01/03 1223) Resp:  [18] 18  (01/03 1223) BP: (150-172)/(57-81) 155/81 (01/03 1223) SpO2:  [97 %-99 %] 98 % (01/03 1223)  GENERAL: Awake, but falls asleep easily. NAD HEENT: Normocephalic and atraumatic, dry mm LUNGS: Normal respiratory effort.  CV: RRR.  Ext: warm   NEURO:  Mental Status: Awake but falls asleep. Is able to state she is in a hospital but not why. Does not know day, date, year.  Speech/Language: speech is without dysarthria, but quiet. Speech is basically one word answers, no conversation. Comprehension wanes during exam.  Cranial Nerves: Exam is limited due to pt cooperation and AMS.  II: PERRL.  III, IV, VI: Pt will not follow finger. Eyelids elevate symmetrically.  V: Sensation is intact to light touch and symmetrical to face.  VII: Smile is symmetrical.  VIII: hearing intact to loud voice. Very HOH.  IX, X: Palate elevates symmetrically. Phonation is normal.  XI: Will not follow command for shoulder shrug.  XII: tongue is midline without fasciculations. Motor: Unable to test strength due to not following commands.  Tone is decreased. Bulk is normal.  Sensation- Intact to light touch bilaterally.    Coordination: No following commands.  DTRs: 2+ throughout UEs. Absent LEs.  Gait- deferred  Medications  Current Facility-Administered Medications:  .  acetaminophen (TYLENOL) suppository 650 mg, 650 mg, Rectal, Q6H PRN, Opyd, Lavone Neri, MD, 650 mg at 09/06/20 0856 .  acetaminophen (TYLENOL) tablet 650 mg, 650 mg, Oral, Q6H  PRN, Rise Patience, MD, 650 mg at 09/02/20 2315 .  aspirin chewable tablet 81 mg, 81 mg, Oral, Daily, Rise Patience, MD, 81 mg at 09/04/20 0946 .  clopidogrel (PLAVIX) tablet 75 mg, 75 mg, Oral, Daily, Pahwani, Rinka R, MD, 75 mg at 09/07/20 0909 .  enoxaparin (LOVENOX) injection 40 mg, 40 mg, Subcutaneous, Q24H, Hall, Carole N, DO, 40 mg at 09/06/20 1752 .  feeding supplement (ENSURE ENLIVE / ENSURE PLUS) liquid 237 mL, 237 mL, Oral, TID BM, Pahwani, Rinka  R, MD, 237 mL at 09/07/20 0911 .  HYDROcodone-acetaminophen (NORCO/VICODIN) 5-325 MG per tablet 1-2 tablet, 1-2 tablet, Oral, Q4H PRN, Opyd, Timothy S, MD .  insulin aspart (novoLOG) injection 0-9 Units, 0-9 Units, Subcutaneous, TID WC, Rise Patience, MD, 2 Units at 09/07/20 1305 .  lenalidomide (REVLIMID) capsule 10 mg, 10 mg, Oral, Daily, Rise Patience, MD, 10 mg at 09/06/20 1522 .  LORazepam (ATIVAN) injection 0.5 mg, 0.5 mg, Intravenous, Once, Hall, Carole N, DO .  losartan (COZAAR) tablet 100 mg, 100 mg, Oral, Daily, Hall, Carole N, DO .  meclizine (ANTIVERT) tablet 12.5 mg, 12.5 mg, Oral, TID PRN, Pahwani, Rinka R, MD, 12.5 mg at 08/31/20 1617 .  metoprolol tartrate (LOPRESSOR) injection 2.5 mg, 2.5 mg, Intravenous, Q4H PRN, Irene Pap N, DO, 2.5 mg at 09/05/20 1713 .  multivitamin with minerals tablet 1 tablet, 1 tablet, Oral, Daily, Pahwani, Rinka R, MD, 1 tablet at 09/07/20 0909 .  ondansetron (ZOFRAN) injection 4 mg, 4 mg, Intravenous, Q6H PRN, Pahwani, Rinka R, MD, 4 mg at 08/31/20 1429 .  polyethylene glycol (MIRALAX / GLYCOLAX) packet 17 g, 17 g, Oral, Daily, Pahwani, Rinka R, MD, 17 g at 09/07/20 0909 .  rosuvastatin (CRESTOR) tablet 10 mg, 10 mg, Oral, q1800, Rise Patience, MD, 10 mg at 09/04/20 1753 .  senna-docusate (Senokot-S) tablet 1 tablet, 1 tablet, Oral, BID, Pahwani, Rinka R, MD, 1 tablet at 09/07/20 0909 .  vitamin B-12 (CYANOCOBALAMIN) tablet 1,000 mcg, 1,000 mcg, Oral, Daily, Rise Patience, MD, 1,000 mcg at 09/07/20 0909 Labs CBC    Component Value Date/Time   WBC 1.4 (LL) 09/06/2020 1008   RBC 3.37 (L) 09/06/2020 1008   HGB 10.1 (L) 09/06/2020 1008   HCT 29.2 (L) 09/06/2020 1008   PLT 93 (L) 09/06/2020 1008   MCV 86.6 09/06/2020 1008   MCH 30.0 09/06/2020 1008   MCHC 34.6 09/06/2020 1008   RDW 15.5 09/06/2020 1008   LYMPHSABS 0.1 (L) 09/06/2020 1008   MONOABS 0.4 09/06/2020 1008   EOSABS 0.0 09/06/2020 1008   BASOSABS 0.0  09/06/2020 1008    CMP     Component Value Date/Time   NA 134 (L) 09/06/2020 1008   NA 137 11/20/2018 0000   K 3.3 (L) 09/06/2020 1008   CL 99 09/06/2020 1008   CO2 24 09/06/2020 1008   GLUCOSE 167 (H) 09/06/2020 1008   BUN 15 09/06/2020 1008   BUN 16 11/20/2018 0000   CREATININE 0.64 09/06/2020 1008   CALCIUM 8.6 (L) 09/06/2020 1008   PROT 5.6 (L) 09/06/2020 1008   ALBUMIN 3.0 (L) 09/06/2020 1008   AST 12 (L) 09/06/2020 1008   ALT 14 09/06/2020 1008   ALKPHOS 62 09/06/2020 1008   BILITOT 0.6 09/06/2020 1008   GFRNONAA >60 09/06/2020 1008   GFRAA >60 04/07/2020 1557    Lipid Panel     Component Value Date/Time   CHOL 99 08/28/2020 0455   TRIG 118 08/28/2020 0455  HDL 40 (L) 08/28/2020 0455   CHOLHDL 2.5 08/28/2020 0455   VLDL 24 08/28/2020 0455   LDLCALC 35 08/28/2020 0455   LDLDIRECT 53.0 06/17/2020 1135     Imaging CTA H/N 09/02/20  1. No large vessel occlusion, hemodynamically significant stenosis, or evidence of dissection. 2. Heterogeneous left thyroid lobe nodule measuring 1.2 cm.  CT head 09/04/20: Known punctate infarcts within the right putamen and cerebellum noted on prior MRI examination are not well appreciated on this examination. No definite evidence of acute intracranial hemorrhage or infarct.  MRI brain 09/02/20: . 1. Unchanged subcentimeter acute infarction within the putamen on the right. 2. Newly seen 5-7 punctate foci of acute infarctions scattered within the inferior cerebellum on both sides. No large confluent infarction. Being a different vascular territory, this suggests embolic disease from the heart or ascending aorta. 3. Chronic small-vessel ischemic changes elsewhere throughout the brain as outlined above. 4. Findings discussed with hospitalist physician at the time of interpretation.  Assessment: 85 yo female who presented to ED 10 days ago with a 2 day hx of confusion. Son reported overall decline since Jul 30, 2020. Neuro was  consulted when CT head/MRI brain revealed stroke. We were also asked to weigh in on AMS. At that time, the stroke workup and encephalopathy workup were WDL except for minorly low normal Mg, hypokalemia, and hyponatremia. There were corrected. No infection was found to explain encephalopathy. It was thought that pt's AMS was not due to her stroke noted on imaging.   Impression:  1. Stroke-this does not account for pt's waxing and weaning mental status 2. AMS most likely related to hospital delirium as well as overall health decline over past few months.  Recommendations: -No further neurology workup. Repeating brain imaging will likely not reveal any findings to change present tx plan. She is already being tx'd with Plavix and ASA.  -Discussed tests, imaging, and course of delirium/encephalopathy with son. -Strict delirium precautions. Eliminate interruptions in sleep.  -continue mngt of metabolic derangement as you are doing.   Pt seen by Clance Boll, MSN, APN-BC/Nurse Practitioner/Neuro, Dr. Rosita Kea, neuro psych resident and later by MD. Note and plan to be edited as needed by MD.  Pager: [phone]

## 2020-09-07 NOTE — TOC Progression Note (Signed)
Transition of Care [facility]) - Progression Note    Patient Details  Name: Kara Griffin MRN: [id number] Date of Birth: 27-Oct-1935  [facility] CM/SW Contact  Eduard Roux, [place] Phone Number: 09/07/2020, 4:18 PM  Clinical Narrative:     Per MD, patient is not medically stable yet- CSW contacted [facility] to advise patient not medically stable for discharge-   Patient will need insurance authorization and covid test before discharged to SNF when medically stable.  Antony Blackbird, MSW, LCSW Clinical Social Worker   Expected Discharge Plan: Skilled Nursing Facility Barriers to Discharge: SNF Pending bed offer,Insurance Authorization,Continued Medical Work up  Expected Discharge Plan and Services Expected Discharge Plan: Skilled Nursing Facility     Post Acute Care Choice: Skilled Nursing Facility Living arrangements for the past 2 months: Single Family Home Expected Discharge Date: 09/05/20                                     Social Determinants of Health (SDOH) Interventions    Readmission Risk Interventions No flowsheet data found.

## 2020-09-07 NOTE — Progress Notes (Addendum)
PROGRESS NOTE    Kara Griffin  [name]  DOB: 11-20-1935  DOA: 08/09/2020 PCP: Einar Pheasant, MD Outpatient Specialists:   Hospital course:  She is an 85 year old female with MDS DM2, HTN who was admitted 08/20/2020 with acute basal ganglia lacunar infarct secondary to small vessel disease. She completed a stroke work-up. MRA brain no large vessel obstruction. Moderate L A1 stenosis. Carotid Doppler ultrasound unremarkable. 2D echo EF 75%, no source of embolus. EEG suggestive of mild diffuse encephalopathy, nonspecific etiology. No seizures or epileptiform discharges were seen throughout the recording. LDL 35. Hemoglobin A1c 6.0.  Seen by neurology/stroke team with recommendation for aspirin 81 mg daily and Plavix 75 mg daily for 3 weeks followed by aspirin alone. PT OT recommended SNF.  MRI brain was done on 09/02/2020 which showednew 5-7 punctate foci of acute infarctions scattered within the inferior cerebellum on both sides, suggestive of embolic disease from the heart or ascending aorta.Cardiology was contacted for arrangement of 30-day cardiac event monitoring outpatient.  She had a CT a head and neck completed which was unremarkable. Bilateral lower extremity venous Doppler ultrasound was negative for DVT. Neurology signed off. She will follow-up outpatient at [facility] in about 4 weeks.  Hospital course complicated by delirium with waxing and waning altered mental status.  Her sons strongly believes that she has a UTI.  Urine culture was negative.  She was given additional 3 days of antibiotics.  Lactic acid and procalcitonin negative.  Afebrile.  No evidence of active infective process.  DC'd antibiotics on 09/05/2020. Patient had a recurrence of altered mental status overnight.  CT head was done which was negative for any acute intracranial findings.  CBC with differential and BMP were ordered overnight which showed no significant difference from  previous.  Sons were very concerned about her mental status on day of discharge so patient was kept yesterday for further evaluation.  Subjective:  Patient appears somewhat more alert today.  She smiles at me and answers questions with few words.  She is able to follow some commands.  She offers no concerns.  Long conversation with patient's 3 sons, Aaron Edelman at bedside and Earley Favor and [place] on the phone.  We went through the etiology and work-up of metabolic encephalopathy/delirium.  They also expressed concern that patient was not able to take her Revlimid yesterday even that was ordered.   Objective: Vitals:   09/07/20 0025 09/07/20 0251 09/07/20 0820 09/07/20 1223  BP: (!) 165/62 (!) 150/59 (!) 172/73 (!) 155/81  Pulse: 77 (!) 55 73 76  Resp: 18  18 18   Temp: 98.1 F (36.7 C) 98.4 F (36.9 C) 98.1 F (36.7 C) 97.7 F (36.5 C)  TempSrc: Axillary Axillary Oral Oral  SpO2: 99% 99% 98% 98%    Intake/Output Summary (Last 24 hours) at 09/07/2020 1758 Last data filed at 09/07/2020 0820 Gross per 24 hour  Intake --  Output 300 ml  Net -300 ml   There were no vitals filed for this visit.   Exam:  General: Sleepy but arousable by voice and light touch.  Intermittently smiling.  Intermittently following simple commands. Eyes: sclera anicteric, conjuctiva mild injection bilaterally CVS: S1-S2, regular  Respiratory:  decreased air entry bilaterally secondary to decreased inspiratory effort, rales at bases  GI: NABS, soft, NT  LE: No edema.  Neuro: Acutely delirious   Assessment & Plan:    Acute metabolic encephalopathy Very much appreciate neurology consultation who note that there is no further work-up needed for  acute delirium which may just be secondary to elderly patient who has had progressive slow decline in functioning over the past month and is now in an acutely hospitalized situation. Of note patient did get 1 dose of Ativan prior to her MRI which was right before her  decompensation. Meds have been optimized, electrolytes have also been optimized, no evidence of infection noted. Strict delirium parameters were put in place.  MDS Patient was unable to take Revlimid orally yesterday Very much appreciate hematology/oncology recommendations At present we will hold Revlimid Family has expressed concern in the past regarding MDS if she comes off Revlimid Will address this again in the morning with the family, hopefully they will have contacted Dr. Lowell Guitar her hematologist prior to that.  Hypokalemia Repeat potassium pending  CVA Patient remains on aspirin and Plavix  HTN Blood pressure under reasonable control  DM 2 Blood sugar under good control under present management  Left thyroid nodule Can be followed up as an outpatient as warranted Patient son is aware    DVT prophylaxis: Lovenox Code Status: Full Family Communication: Multiple conversations with patient's son Ryelee Albee Disposition Plan:   Patient is from: Home  Anticipated Discharge Location: SNF  Barriers to Discharge: Delirium work-up  Is patient medically stable for Discharge: Yes  Consultants:  Neurology  Procedures:  MRI/MRA brain  EEG  Carotid Doppler  CT renal study  Right upper quadrant ultrasound  Antimicrobials:   Cefepime  Vancomycin  Rocephin    Data Reviewed:  Basic Metabolic Panel: Recent Labs  Lab 09/02/20 0217 09/03/20 1017 09/04/20 0438 09/04/20 2145 09/06/20 1008  NA 132* 132* 135 133* 134*  K 2.9* 3.6 3.7 3.7 3.3*  CL 96* 98 99 97* 99  CO2 25 26 27 26 24   GLUCOSE 152* 212* 162* 168* 167*  BUN 8 9 9 13 15   CREATININE 0.61 0.57 0.66 0.59 0.64  CALCIUM 8.7* 8.4* 8.7* 8.8* 8.6*  MG  --  1.8  --   --   --    Liver Function Tests: Recent Labs  Lab 09/04/20 0438 09/04/20 2145 09/06/20 1008  AST 9* 11* 12*  ALT 13 12 14   ALKPHOS 65 65 62  BILITOT 0.5 0.4 0.6  PROT 5.4* 5.4* 5.6*  ALBUMIN 2.8* 2.9* 3.0*   No  results for input(s): LIPASE, AMYLASE in the last 168 hours. No results for input(s): AMMONIA in the last 168 hours. CBC: Recent Labs  Lab 09/02/20 0217 09/03/20 1017 09/04/20 0438 09/04/20 2145 09/06/20 1008  WBC 1.9* 1.7* 1.8* 1.7* 1.4*  NEUTROABS  --  1.1* 1.2*  --  0.9*  HGB 9.4* 9.7* 9.6* 9.2* 10.1*  HCT 26.8* 27.2* 27.1* 26.0* 29.2*  MCV 86.2 86.1 85.5 86.4 86.6  PLT 116* 113* 113* 116* 93*   Cardiac Enzymes: No results for input(s): CKTOTAL, CKMB, CKMBINDEX, TROPONINI in the last 168 hours. BNP (last 3 results) No results for input(s): PROBNP in the last 8760 hours. CBG: Recent Labs  Lab 09/06/20 1340 09/06/20 1624 09/07/20 0830 09/07/20 1254 09/07/20 1640  GLUCAP 174* 120* 133* 159* 123*    Recent Results (from the past 240 hour(s))  Culture, Urine     Status: None   Collection Time: 08/29/20  7:35 AM   Specimen: Urine, Random  Result Value Ref Range Status   Specimen Description URINE, RANDOM  Final   Special Requests NONE  Final   Culture   Final    NO GROWTH Performed at [facility],  [address], [place], [place] [address]    Report Status 08/30/2020 FINAL  Final  Resp Panel by RT-PCR (Flu A&B, Covid) Nasopharyngeal Swab     Status: None   Collection Time: 08/31/20 12:10 PM   Specimen: Nasopharyngeal Swab; Nasopharyngeal(NP) swabs in vial transport medium  Result Value Ref Range Status   SARS Coronavirus 2 by RT PCR NEGATIVE NEGATIVE Final    Comment: (NOTE) SARS-CoV-2 target nucleic acids are NOT DETECTED.  The SARS-CoV-2 RNA is generally detectable in upper respiratory specimens during the acute phase of infection. The lowest concentration of SARS-CoV-2 viral copies this assay can detect is 138 copies/mL. A negative result does not preclude SARS-Cov-2 infection and should not be used as the sole basis for treatment or other patient management decisions. A negative result may occur with  improper specimen collection/handling, submission  of specimen other than nasopharyngeal swab, presence of viral mutation(s) within the areas targeted by this assay, and inadequate number of viral copies(<138 copies/mL). A negative result must be combined with clinical observations, patient history, and epidemiological information. The expected result is Negative.  Fact Sheet for Patients:  EntrepreneurPulse.com.au  Fact Sheet for Healthcare Providers:  IncredibleEmployment.be  This test is no t yet approved or cleared by the [place] FDA and  has been authorized for detection and/or diagnosis of SARS-CoV-2 by FDA under an Emergency Use Authorization (EUA). This EUA will remain  in effect (meaning this test can be used) for the duration of the COVID-19 declaration under Section 564(b)(1) of the Act, 21 U.S.C.section 360bbb-3(b)(1), unless the authorization is terminated  or revoked sooner.       Influenza A by PCR NEGATIVE NEGATIVE Final   Influenza B by PCR NEGATIVE NEGATIVE Final    Comment: (NOTE) The Xpert Xpress SARS-CoV-2/FLU/RSV plus assay is intended as an aid in the diagnosis of influenza from Nasopharyngeal swab specimens and should not be used as a sole basis for treatment. Nasal washings and aspirates are unacceptable for Xpert Xpress SARS-CoV-2/FLU/RSV testing.  Fact Sheet for Patients: EntrepreneurPulse.com.au  Fact Sheet for Healthcare Providers: IncredibleEmployment.be  This test is not yet approved or cleared by the [place] FDA and has been authorized for detection and/or diagnosis of SARS-CoV-2 by FDA under an Emergency Use Authorization (EUA). This EUA will remain in effect (meaning this test can be used) for the duration of the COVID-19 declaration under Section 564(b)(1) of the Act, 21 U.S.C. section 360bbb-3(b)(1), unless the authorization is terminated or revoked.  Performed at [facility], [place] [address]  [place], [place] [address]   SARS CORONAVIRUS 2 (TAT 6-24 HRS) Nasopharyngeal Nasopharyngeal Swab     Status: None   Collection Time: 09/01/20  4:38 PM   Specimen: Nasopharyngeal Swab  Result Value Ref Range Status   SARS Coronavirus 2 NEGATIVE NEGATIVE Final    Comment: (NOTE) SARS-CoV-2 target nucleic acids are NOT DETECTED.  The SARS-CoV-2 RNA is generally detectable in upper and lower respiratory specimens during the acute phase of infection. Negative results do not preclude SARS-CoV-2 infection, do not rule out co-infections with other pathogens, and should not be used as the sole basis for treatment or other patient management decisions. Negative results must be combined with clinical observations, patient history, and epidemiological information. The expected result is Negative.  Fact Sheet for Patients: SugarRoll.be  Fact Sheet for Healthcare Providers: https://www.woods-mathews.com/  This test is not yet approved or cleared by the [place] FDA and  has been authorized for detection and/or  diagnosis of SARS-CoV-2 by FDA under an Emergency Use Authorization (EUA). This EUA will remain  in effect (meaning this test can be used) for the duration of the COVID-19 declaration under Se ction 564(b)(1) of the Act, 21 U.S.C. section 360bbb-3(b)(1), unless the authorization is terminated or revoked sooner.  Performed at [facility], [place] [address] [place], [place] [address]   Resp Panel by RT-PCR (Flu A&B, Covid) Nasopharyngeal Swab     Status: None   Collection Time: 09/04/20 10:25 AM   Specimen: Nasopharyngeal Swab; Nasopharyngeal(NP) swabs in vial transport medium  Result Value Ref Range Status   SARS Coronavirus 2 by RT PCR NEGATIVE NEGATIVE Final    Comment: (NOTE) SARS-CoV-2 target nucleic acids are NOT DETECTED.  The SARS-CoV-2 RNA is generally detectable in upper respiratory specimens during the acute phase of  infection. The lowest concentration of SARS-CoV-2 viral copies this assay can detect is 138 copies/mL. A negative result does not preclude SARS-Cov-2 infection and should not be used as the sole basis for treatment or other patient management decisions. A negative result may occur with  improper specimen collection/handling, submission of specimen other than nasopharyngeal swab, presence of viral mutation(s) within the areas targeted by this assay, and inadequate number of viral copies(<138 copies/mL). A negative result must be combined with clinical observations, patient history, and epidemiological information. The expected result is Negative.  Fact Sheet for Patients:  EntrepreneurPulse.com.au  Fact Sheet for Healthcare Providers:  IncredibleEmployment.be  This test is no t yet approved or cleared by the [place] FDA and  has been authorized for detection and/or diagnosis of SARS-CoV-2 by FDA under an Emergency Use Authorization (EUA). This EUA will remain  in effect (meaning this test can be used) for the duration of the COVID-19 declaration under Section 564(b)(1) of the Act, 21 U.S.C.section 360bbb-3(b)(1), unless the authorization is terminated  or revoked sooner.       Influenza A by PCR NEGATIVE NEGATIVE Final   Influenza B by PCR NEGATIVE NEGATIVE Final    Comment: (NOTE) The Xpert Xpress SARS-CoV-2/FLU/RSV plus assay is intended as an aid in the diagnosis of influenza from Nasopharyngeal swab specimens and should not be used as a sole basis for treatment. Nasal washings and aspirates are unacceptable for Xpert Xpress SARS-CoV-2/FLU/RSV testing.  Fact Sheet for Patients: EntrepreneurPulse.com.au  Fact Sheet for Healthcare Providers: IncredibleEmployment.be  This test is not yet approved or cleared by the [place] FDA and has been authorized for detection and/or diagnosis of SARS-CoV-2  by FDA under an Emergency Use Authorization (EUA). This EUA will remain in effect (meaning this test can be used) for the duration of the COVID-19 declaration under Section 564(b)(1) of the Act, 21 U.S.C. section 360bbb-3(b)(1), unless the authorization is terminated or revoked.  Performed at [facility], [place] [address] [place], [place] [address]   Culture, Urine     Status: Abnormal   Collection Time: 09/04/20  8:20 PM   Specimen: Urine, Random  Result Value Ref Range Status   Specimen Description URINE, RANDOM  Final   Special Requests   Final    NONE Performed at [facility], [place] [address] [place], [place] [address]    Culture 40,000 COLONIES/mL YEAST (A)  Final   Report Status 09/06/2020 FINAL  Final      Studies: No results found.   Scheduled Meds: . aspirin  81 mg Oral Daily  . clopidogrel  75 mg Oral Daily  . enoxaparin (  LOVENOX) injection  40 mg Subcutaneous Q24H  . feeding supplement  237 mL Oral TID BM  . insulin aspart  0-9 Units Subcutaneous TID WC  . lenalidomide  10 mg Oral Daily  . LORazepam  0.5 mg Intravenous Once  . losartan  100 mg Oral Daily  . multivitamin with minerals  1 tablet Oral Daily  . polyethylene glycol  17 g Oral Daily  . rosuvastatin  10 mg Oral q1800  . senna-docusate  1 tablet Oral BID  . vitamin B-12  1,000 mcg Oral Daily   Continuous Infusions:  Principal Problem:   Acute encephalopathy Active Problems:   Myelodysplastic syndrome (HCC)   Diabetes mellitus (HCC)   Pancytopenia (HCC)   Cerebral thrombosis with cerebral infarction   Ischemic stroke ([place])   Pressure injury of skin     Darragh Nay Tublu Floella Ensz, [facility]  If 7PM-7AM, please contact night-coverage [email or web address] Password [email or web address] 09/07/2020, 5:58 PM    LOS: 10 days

## 2020-09-07 NOTE — Progress Notes (Signed)
  Speech Language Pathology Treatment: Dysphagia  Patient Details Name: Kara Griffin MRN: [id number] DOB: [birth year] Today's Date: 09/07/2020 Time: 1210-1221 SLP Time Calculation (min) (ACUTE ONLY): 11 min  Assessment / Plan / Recommendation Clinical Impression  Pt can be easily aroused this morning, but shakes her head "no" when asked if she's thirsty or would like something to drink. When presented with trials of thin and nectar thick liquids she only accepted boluses x2 (one of each). She swallowed the thin liquids swiftly but nectar thick liquids were orally held until prompted to expectorate. Upon any further attempts, pt shows no awareness or acceptance of POs, and when asked again if she would try any, she shook her head "no." Trials were stopped at this time. She is still not ready for MBS if she is not orally accepting boluses, but will continue to follow. Will f/u for ongoing family education as well, as family member had stepped out upon SLP arrival.    HPI HPI: Kara Griffin is a 85 y.o. female with PMH significant for DM2, HTN, Myelodysplastic syndrome, HLD who is admitted with dizziness and falls that have been progressively worsening for the last month. She was brought in by family for worsening confusion and now in her bed all day x 2 days.Son reports the patient has had some symptoms of gastroenteritis with nausea, has not thrown up and some diarrhea. MRI brain demonstrated an acute right basal ganglia lacunar infarct. Neurologist feels Stroke is an incidental finding, unrelated to waxing and waning mentation.      SLP Plan  Continue with current plan of care       Recommendations  Diet recommendations: Other(comment) (NPO except floor stock nectar thick liquids and puree solids if alert and accepting) Medication Administration: Crushed with puree (may need to consider alternative means if not consistently able to take them) Supervision: Full  supervision/cueing for compensatory strategies Compensations: Minimize environmental distractions;Slow rate;Small sips/bites Postural Changes and/or Swallow Maneuvers: Seated upright 90 degrees;Upright 30-60 min after meal                Oral Care Recommendations: Oral care QID Follow up Recommendations: Skilled Nursing facility SLP Visit Diagnosis: Dysphagia, unspecified (R13.10) Plan: Continue with current plan of care       GO                Mahala Menghini., M.A. CCC-SLP Acute Rehabilitation Services Pager [phone] Office [phone]  09/07/2020, 12:31 PM

## 2020-09-07 NOTE — Consult Note (Signed)
[place]  [phone] Fax:([phone]) [place]  Referring MD:  Dr. Bonnell Public  Reason for Referral: Myelodysplastic syndrome with 5q deletion, initially diagnosed in 2002 and has been on treatment with Revlimid 10 mg daily since 2006  HPI: Kara Griffin is an 85 year old female with a past medical history significant for myelodysplastic syndrome followed by Dr. Florene Glen at [facility] on Revlimid, pancytopenia, diabetes mellitus, hypertension, hyperlipidemia.  The patient was brought to the emergency room by the patient's son after she was found to be increasingly confused for 2 days prior to admission.  Around Thanksgiving, the patient had become gradually weaker and not herself.  PCP recently decreased her antihypertensives due to low blood pressures a few days prior to admission.  She also has chronic dizziness.  On 08/19/2020, the patient was taken to [facility] after she had a fall at home.  Imaging showed features concerning for right-sided mucous plugging and MAI and she was placed on a Z-Pak.  Following the ER visit, she had some diarrhea while on antibiotics.  She also had poor appetite and some nausea.  No recent fevers reported.  On admission to our facility, CBC showed WBC 3.4, hemoglobin 11.2, platelets 141,000.  MRI of the brain performed 08/28/2020 showed an acute right basal ganglia lacunar infarct and mild chronic small vessel ischemic disease.  Additional work-up including ammonia, RPR, vitamin B12, folate, vitamin B1 was unremarkable.  The patient has been seen by neurology/stroke team and has recommended aspirin 81 mg daily and Plavix 75 mg daily for 3 weeks followed by aspirin alone.  Repeat MRI of the brain performed on 09/02/2020 showed new 5-7 punctate foci of acute infarction scattered within the inferior cerebellum on both sides suggestive of embolic disease from the heart or ascending aorta.  The patient has been  evaluated by PT who recommends SNF.  Speech therapy has documented that she was able to swallow thin liquids swiftly but nectar thick liquids were held expectorated.  Speech therapy also following and they recommend n.p.o. except for nectar thick liquids and pured solids.  Medications may be crushed with pure.  Most recent CBC performed this admission was on 09/06/2020 and WBC was 1.4, hemoglobin 10.1, hematocrit 29.2, platelets 93,000, ANC 0.9.  Outside records related to her MDS were reviewed through care everywhere.  Her oncology history is as follows:  "Oncology History  Myelodysplastic syndrome with 5q deletion ([place])  04/2001 Initial Diagnosis  Myelodysplastic syndrome with 5q deletion- with history of anemia dating back to 1995. Followed with supportive care initially.  02/2005 - Chemotherapy  Revlimid 10mg  daily started"  The patient was seen in her hospital room.  Her son is at the bedside.  She was initially awake and was able to shake her head yes/no and acknowledge me, but quickly fell asleep.  Her son provides the history.  The patient has been in her usual state of health until at least Thanksgiving of 2021.  However, she did have some weakness that was noted by her family around Thanksgiving.  She has had a slow, progressive decline over about the past month which eventually prompted her family to bring her to the emergency room for further evaluation.  Prior to her decline, she was quite functional and maintained her own ADLs and IADLs.  With regards to her MDS, she has been maintained on Revlimid 10 mg daily for many years.  She has overall tolerated this medication well without  any notable side effects.  Her son states that she was more alert yesterday and earlier today.  Overall, he does not feel as though her confusion has improved significantly.  Family has not noted any coughing or shortness of breath.  She did have some abdominal pain earlier this admission which was attributed to  constipation.  No bleeding has been noted.  Has been off of Mohamed for about 2 days.  They attempted to administer the medication the other day and the patient was unable to swallow it.  The capsule dissolved in her mouth leaving the powder behind.  Medications were given in applesauce today and the patient held onto them on her mouth and they seemed to get hung up. Hematology was asked see the patient to make recommendations regarding her MDS.  Past Medical History:  Diagnosis Date  . Anemia   . Diabetes mellitus ([place])   . Environmental allergies   . Hypercholesterolemia   . Hypertension   . Myelodysplastic syndrome ([place])    Sees Dr Jerrye Noble  :    Past Surgical History:  Procedure Laterality Date  . ABDOMINAL HYSTERECTOMY    . [place]  . DILATION AND CURETTAGE OF UTERUS    . TONSILECTOMY, ADENOIDECTOMY, BILATERAL MYRINGOTOMY AND TUBES  1963  . TOTAL ABDOMINAL HYSTERECTOMY W/ BILATERAL SALPINGOOPHORECTOMY  1985   secondary to abnormal cells and abnormal uterine bleeding  :   CURRENT MEDS: Current Facility-Administered Medications  Medication Dose Route Frequency Provider Last Rate Last Admin  . acetaminophen (TYLENOL) suppository 650 mg  650 mg Rectal Q6H PRN Opyd, Ilene Qua, MD   650 mg at 09/06/20 0856  . acetaminophen (TYLENOL) tablet 650 mg  650 mg Oral Q6H PRN Rise Patience, MD   650 mg at 09/02/20 2315  . aspirin chewable tablet 81 mg  81 mg Oral Daily Rise Patience, MD   81 mg at 09/04/20 0946  . clopidogrel (PLAVIX) tablet 75 mg  75 mg Oral Daily Pahwani, Rinka R, MD   75 mg at 09/07/20 0909  . enoxaparin (LOVENOX) injection 40 mg  40 mg Subcutaneous Q24H Hall, Carole N, DO   40 mg at 09/06/20 1752  . feeding supplement (ENSURE ENLIVE / ENSURE PLUS) liquid 237 mL  237 mL Oral TID BM Pahwani, Rinka R, MD   237 mL at 09/07/20 0911  . HYDROcodone-acetaminophen (NORCO/VICODIN) 5-325 MG per tablet 1-2 tablet  1-2 tablet Oral Q4H PRN Opyd,  Ilene Qua, MD      . insulin aspart (novoLOG) injection 0-9 Units  0-9 Units Subcutaneous TID WC Rise Patience, MD   2 Units at 09/07/20 1305  . lenalidomide (REVLIMID) capsule 10 mg  10 mg Oral Daily Rise Patience, MD   10 mg at 09/06/20 1522  . LORazepam (ATIVAN) injection 0.5 mg  0.5 mg Intravenous Once Hall, Carole N, DO      . losartan (COZAAR) tablet 100 mg  100 mg Oral Daily Hall, Carole N, DO      . meclizine (ANTIVERT) tablet 12.5 mg  12.5 mg Oral TID PRN Pahwani, Rinka R, MD   12.5 mg at 08/31/20 1617  . metoprolol tartrate (LOPRESSOR) injection 2.5 mg  2.5 mg Intravenous Q4H PRN Irene Pap N, DO   2.5 mg at 09/05/20 1713  . multivitamin with minerals tablet 1 tablet  1 tablet Oral Daily Pahwani, Rinka R, MD   1 tablet at 09/07/20 0909  . ondansetron (ZOFRAN) injection 4 mg  4 mg Intravenous Q6H PRN Pahwani, Rinka R, MD   4 mg at 08/31/20 1429  . polyethylene glycol (MIRALAX / GLYCOLAX) packet 17 g  17 g Oral Daily Pahwani, Rinka R, MD   17 g at 09/07/20 0909  . rosuvastatin (CRESTOR) tablet 10 mg  10 mg Oral q1800 Eduard Clos, MD   10 mg at 09/04/20 1753  . senna-docusate (Senokot-S) tablet 1 tablet  1 tablet Oral BID Ollen Bowl, MD   1 tablet at 09/07/20 0909  . vitamin B-12 (CYANOCOBALAMIN) tablet 1,000 mcg  1,000 mcg Oral Daily Eduard Clos, MD   1,000 mcg at 09/07/20 5366      Allergies  Allergen Reactions  . Ibuprofen     Lowers WBCs  :  Family History  Problem Relation Age of Onset  . Heart disease Father        Deceased (MI) - 64  . Diabetes Mother        Deceased  . Thyroid cancer Mother   . Osteoarthritis Mother   . Asthma Other   . Diabetes Brother   . Heart disease Maternal Grandmother        myocardial infarction - 2  . Asthma Maternal Grandmother   . Heart disease Paternal Grandmother        myocardial infarction-52  . Heart disease Brother   . Sinusitis Brother   . Depression Son   . Diabetes Son   . Depression  Son   . Diabetes Son   . Depression Son   . Breast cancer Neg Hx   :  Social History   Socioeconomic History  . Marital status: Divorced    Spouse name: Not on file  . Number of children: 3  . Years of education: Not on file  . Highest education level: Not on file  Occupational History  . Occupation: retired Film/video editor  Tobacco Use  . Smoking status: Never Smoker  . Smokeless tobacco: Never Used  Vaping Use  . Vaping Use: Never used  Substance and Sexual Activity  . Alcohol use: No    Alcohol/week: 0.0 standard drinks  . Drug use: No  . Sexual activity: Never  Other Topics Concern  . Not on file  Social History Narrative  . Not on file   Social Determinants of Health   Financial Resource Strain: Not on file  Food Insecurity: Not on file  Transportation Needs: Not on file  Physical Activity: Not on file  Stress: Not on file  Social Connections: Not on file  Intimate Partner Violence: Not on file  :  REVIEW OF SYSTEMS: As noted in the HPI.  Unable to obtain review of systems from the patient.  Son provides history.  Exam: Patient Vitals for the past 24 hrs:  BP Temp Temp src Pulse Resp SpO2  09/07/20 1223 (!) 155/81 97.7 F (36.5 C) Oral 76 18 98 %  09/07/20 0820 (!) 172/73 98.1 F (36.7 C) Oral 73 18 98 %  09/07/20 0251 (!) 150/59 98.4 F (36.9 C) Axillary (!) 55 - 99 %  09/07/20 0025 (!) 165/62 98.1 F (36.7 C) Axillary 77 18 99 %  09/06/20 2138 (!) 161/65 98.6 F (37 C) Axillary 82 18 97 %  09/06/20 1626 (!) 168/57 98.3 F (36.8 C) Axillary 77 - 99 %    General: Initially awake and alert, but quickly fell asleep and difficult to arouse Eyes:  no scleral icterus.   ENT:  There were no oropharyngeal  lesions.    Lymphatics:  Negative cervical, supraclavicular or axillary adenopathy.   Respiratory: Diminished breath sounds due to decreased inspiratory effort Cardiovascular:  Regular rate and rhythm, S1/S2, without murmur, rub or gallop.  There was no  pedal edema.   GI:  abdomen was soft, flat, nontender, nondistended, without organomegaly.    Skin: No petechiae, scattered ecchymoses arms noted Neuro exam was nonfocal.  The patient was initially alert and able to answer yes/no questions.  Fell asleep and was difficult to arouse.  I could not get her to follow commands to assess strength in the upper and lower extremities.  However, she spontaneously moved her left arm and leg.  Right arm and leg with decreased movement spontaneously.  LABS:  Lab Results  Component Value Date   WBC 1.4 (LL) 09/06/2020   HGB 10.1 (L) 09/06/2020   HCT 29.2 (L) 09/06/2020   PLT 93 (L) 09/06/2020   GLUCOSE 167 (H) 09/06/2020   CHOL 99 08/28/2020   TRIG 118 08/28/2020   HDL 40 (L) 08/28/2020   LDLDIRECT 53.0 06/17/2020   LDLCALC 35 08/28/2020   ALT 14 09/06/2020   AST 12 (L) 09/06/2020   NA 134 (L) 09/06/2020   K 3.3 (L) 09/06/2020   CL 99 09/06/2020   CREATININE 0.64 09/06/2020   BUN 15 09/06/2020   CO2 24 09/06/2020   INR 0.9 05/08/2019   HGBA1C 6.0 (H) 08/28/2020   MICROALBUR 1.2 06/17/2020    CT ANGIO HEAD W OR WO CONTRAST  Result Date: 09/02/2020 CLINICAL DATA:  Stroke/TIA. Assess intracranial and extracranial arteries. EXAM: CT ANGIOGRAPHY HEAD AND NECK TECHNIQUE: Multidetector CT imaging of the head and neck was performed using the standard protocol during bolus administration of intravenous contrast. Multiplanar CT image reconstructions and MIPs were obtained to evaluate the vascular anatomy. Carotid stenosis measurements (when applicable) are obtained utilizing NASCET criteria, using the distal internal carotid diameter as the denominator. CONTRAST:  75mL OMNIPAQUE IOHEXOL 350 MG/ML SOLN COMPARISON:  MRI of the brain September 03, 2019. FINDINGS: CT HEAD FINDINGS Brain: No evidence of acute infarction, hemorrhage, hydrocephalus, extra-axial collection or mass lesion/mass effect. Acute small infarcts are better demonstrated on recent MRI of the  brain performed earlier today. Vascular: Calcified plaques in the bilateral carotid siphons and bilateral vertebral arteries. Skull: Normal. Negative for fracture or focal lesion. Sinuses: Imaged portions are clear. Orbits: No acute finding. Review of the MIP images confirms the above findings CTA NECK FINDINGS Aortic arch: Standard branching. Imaged portion shows no evidence of aneurysm or dissection. No significant stenosis of the major arch vessel origins. Right carotid system: Increased tortuosity of the innominate and proximal right common carotid artery and distal cervical segment of the right ICA. Mild atherosclerotic changes in the right carotid bifurcation. No evidence of dissection, stenosis (50% or greater) or occlusion. Left carotid system: Mild luminal irregularity cervical segment of the left ICA, likely related to mild atherosclerotic disease without stenosis. There is also increased tortuosity of the upper cervical segment of the left ICA. No evidence of dissection, stenosis (50% or greater) or occlusion. Vertebral arteries: The left vertebral artery is dominant. No evidence of dissection, stenosis (50% or greater) or occlusion. Skeleton: Degenerative changes of the cervical. No aggressive bone lesion seen. Other neck: Heterogeneous left thyroid lobe nodule measuring 1.2 cm. Upper chest: Biapical scarring and ground-glass opacities. Review of the MIP images confirms the above findings CTA HEAD FINDINGS Anterior circulation: No significant stenosis, proximal occlusion, aneurysm, or vascular malformation. Posterior circulation:  Minutes if caliber of the V4 segment of the right vertebral artery with a dominant left vertebral artery. No significant stenosis, proximal occlusion, aneurysm, or vascular malformation. Venous sinuses: As permitted by contrast timing, patent. Anatomic variants: None. Review of the MIP images confirms the above findings IMPRESSION: 1. No large vessel occlusion, hemodynamically  significant stenosis, or evidence of dissection. 2. Heterogeneous left thyroid lobe nodule measuring 1.2 cm. Electronically Signed   By: Pedro Earls M.D.   On: 09/02/2020 18:17   DG Chest 1 View  Result Date: 08/19/2020 CLINICAL DATA:  Weakness with increased cough. Fall today. EXAM: CHEST  1 VIEW COMPARISON:  Radiograph 01/26/2016 FINDINGS: Heart is normal in size. Unchanged mediastinal contours. There is chronic hyperinflation. Bronchial thickening appears chronic but increased from prior exam. Subsegmental opacities in both mid lung zones and left lung base. Chronic biapical pleuroparenchymal scarring. No significant pleural effusion. No pneumothorax. No acute osseous abnormalities are seen. IMPRESSION: 1. Subsegmental opacities in both mid lung zones and left lung base, favor atelectasis or scarring, however atypical pneumonia could have a similar appearance. 2. Chronic but increased bronchial thickening may represent acute bronchitis. Chronic hyperinflation. Electronically Signed   By: Keith Rake M.D.   On: 08/19/2020 15:23   DG Lumbar Spine 2-3 Views  Result Date: 08/19/2020 CLINICAL DATA:  Weakness and cough. Fall today. EXAM: LUMBAR SPINE - 2-3 VIEW COMPARISON:  Lumbar radiograph 07/16/2018 FINDINGS: Slight dextroscoliotic curvature unchanged from prior exam. No listhesis. The vertebral body heights are preserved. Disc space narrowing and endplate spurring at [id number], L4-L5, and L5-S1. There is lower lumbar facet hypertrophy. No acute fracture. The sacroiliac joints are congruent. IMPRESSION: 1. No acute fracture or subluxation of the lumbar spine. 2. Mild scoliosis with stable degenerative change from 2019. Electronically Signed   By: Keith Rake M.D.   On: 08/19/2020 15:25   DG Abd 1 View  Result Date: 08/29/2020 CLINICAL DATA:  Abdominal pain and bloating. EXAM: ABDOMEN - 1 VIEW COMPARISON:  None. FINDINGS: The bowel gas pattern is normal, with large amount of air  seen throughout the colon. No radio-opaque calculi or other significant radiographic abnormality are seen. Subcentimeter phleboliths are noted within lower pelvis. IMPRESSION: Negative. Electronically Signed   By: Virgina Norfolk M.D.   On: 08/29/2020 22:54   CT HEAD WO CONTRAST  Result Date: 09/04/2020 CLINICAL DATA:  Cerebral infarction, follow-up examination EXAM: CT HEAD WITHOUT CONTRAST TECHNIQUE: Contiguous axial images were obtained from the base of the skull through the vertex without intravenous contrast. COMPARISON:  MRI 09/02/2020 FINDINGS: Brain: Normal anatomic configuration. Parenchymal volume loss is commensurate with the patient's age. Mild periventricular white matter changes are present likely reflecting the sequela of small vessel ischemia. The known punctate infarcts within the right putamen and cerebellum are not well appreciated on this examination. No abnormal intra or extra-axial mass lesion or fluid collection. No abnormal mass effect or midline shift. No evidence of acute intracranial hemorrhage or infarct. Ventricular size is normal. Cerebellum unremarkable. Vascular: No asymmetric hyperdense vasculature at the skull base. Skull: Intact Sinuses/Orbits: Paranasal sinuses are clear. Orbits are unremarkable. Other: Mastoid air cells and middle ear cavities are clear. IMPRESSION: Known punctate infarcts within the right putamen and cerebellum noted on prior MRI examination are not well appreciated on this examination. No definite evidence of acute intracranial hemorrhage or infarct. Electronically Signed   By: Fidela Salisbury MD   On: 09/04/2020 21:10   CT Head Wo Contrast  Result Date: 08/19/2020 CLINICAL  DATA:  85 year old female with history of minor head trauma from a fall today. EXAM: CT HEAD WITHOUT CONTRAST CT CERVICAL SPINE WITHOUT CONTRAST TECHNIQUE: Multidetector CT imaging of the head and cervical spine was performed following the standard protocol without intravenous  contrast. Multiplanar CT image reconstructions of the cervical spine were also generated. COMPARISON:  None. No priors. FINDINGS: CT HEAD FINDINGS Brain: Mild cerebral atrophy. Patchy and confluent areas of decreased attenuation are noted throughout the deep and periventricular white matter of the cerebral hemispheres bilaterally, compatible with chronic microvascular ischemic disease. No evidence of acute infarction, hemorrhage, hydrocephalus, extra-axial collection or mass lesion/mass effect. Vascular: No hyperdense vessel or unexpected calcification. Skull: Normal. Negative for fracture or focal lesion. Sinuses/Orbits: No acute finding. Other: None. CT CERVICAL SPINE FINDINGS Alignment: Normal. Skull base and vertebrae: No acute fracture. No primary bone lesion or focal pathologic process. Soft tissues and spinal canal: No prevertebral fluid or swelling. No visible canal hematoma. Disc levels: Very mild multilevel degenerative disc disease and facet arthropathy. Upper chest: Bilateral apical pleuroparenchymal thickening and architectural distortion, most compatible with areas of chronic post infectious or inflammatory scarring. Other: None. IMPRESSION: 1. No evidence of significant acute traumatic injury to the skull, brain or cervical spine. 2. Mild cerebral atrophy with chronic microvascular ischemic changes in the cerebral white matter. 3. Very mild multilevel degenerative disc disease and facet arthropathy. Electronically Signed   By: Vinnie Langton M.D.   On: 08/19/2020 18:42   CT ANGIO NECK W OR WO CONTRAST  Result Date: 09/02/2020 CLINICAL DATA:  Stroke/TIA. Assess intracranial and extracranial arteries. EXAM: CT ANGIOGRAPHY HEAD AND NECK TECHNIQUE: Multidetector CT imaging of the head and neck was performed using the standard protocol during bolus administration of intravenous contrast. Multiplanar CT image reconstructions and MIPs were obtained to evaluate the vascular anatomy. Carotid stenosis  measurements (when applicable) are obtained utilizing NASCET criteria, using the distal internal carotid diameter as the denominator. CONTRAST:  35mL OMNIPAQUE IOHEXOL 350 MG/ML SOLN COMPARISON:  MRI of the brain September 03, 2019. FINDINGS: CT HEAD FINDINGS Brain: No evidence of acute infarction, hemorrhage, hydrocephalus, extra-axial collection or mass lesion/mass effect. Acute small infarcts are better demonstrated on recent MRI of the brain performed earlier today. Vascular: Calcified plaques in the bilateral carotid siphons and bilateral vertebral arteries. Skull: Normal. Negative for fracture or focal lesion. Sinuses: Imaged portions are clear. Orbits: No acute finding. Review of the MIP images confirms the above findings CTA NECK FINDINGS Aortic arch: Standard branching. Imaged portion shows no evidence of aneurysm or dissection. No significant stenosis of the major arch vessel origins. Right carotid system: Increased tortuosity of the innominate and proximal right common carotid artery and distal cervical segment of the right ICA. Mild atherosclerotic changes in the right carotid bifurcation. No evidence of dissection, stenosis (50% or greater) or occlusion. Left carotid system: Mild luminal irregularity cervical segment of the left ICA, likely related to mild atherosclerotic disease without stenosis. There is also increased tortuosity of the upper cervical segment of the left ICA. No evidence of dissection, stenosis (50% or greater) or occlusion. Vertebral arteries: The left vertebral artery is dominant. No evidence of dissection, stenosis (50% or greater) or occlusion. Skeleton: Degenerative changes of the cervical. No aggressive bone lesion seen. Other neck: Heterogeneous left thyroid lobe nodule measuring 1.2 cm. Upper chest: Biapical scarring and ground-glass opacities. Review of the MIP images confirms the above findings CTA HEAD FINDINGS Anterior circulation: No significant stenosis, proximal  occlusion, aneurysm, or vascular malformation.  Posterior circulation: Minutes if caliber of the V4 segment of the right vertebral artery with a dominant left vertebral artery. No significant stenosis, proximal occlusion, aneurysm, or vascular malformation. Venous sinuses: As permitted by contrast timing, patent. Anatomic variants: None. Review of the MIP images confirms the above findings IMPRESSION: 1. No large vessel occlusion, hemodynamically significant stenosis, or evidence of dissection. 2. Heterogeneous left thyroid lobe nodule measuring 1.2 cm. Electronically Signed   By: Pedro Earls M.D.   On: 09/02/2020 18:17   CT Cervical Spine Wo Contrast  Result Date: 08/19/2020 CLINICAL DATA:  85 year old female with history of minor head trauma from a fall today. EXAM: CT HEAD WITHOUT CONTRAST CT CERVICAL SPINE WITHOUT CONTRAST TECHNIQUE: Multidetector CT imaging of the head and cervical spine was performed following the standard protocol without intravenous contrast. Multiplanar CT image reconstructions of the cervical spine were also generated. COMPARISON:  None. No priors. FINDINGS: CT HEAD FINDINGS Brain: Mild cerebral atrophy. Patchy and confluent areas of decreased attenuation are noted throughout the deep and periventricular white matter of the cerebral hemispheres bilaterally, compatible with chronic microvascular ischemic disease. No evidence of acute infarction, hemorrhage, hydrocephalus, extra-axial collection or mass lesion/mass effect. Vascular: No hyperdense vessel or unexpected calcification. Skull: Normal. Negative for fracture or focal lesion. Sinuses/Orbits: No acute finding. Other: None. CT CERVICAL SPINE FINDINGS Alignment: Normal. Skull base and vertebrae: No acute fracture. No primary bone lesion or focal pathologic process. Soft tissues and spinal canal: No prevertebral fluid or swelling. No visible canal hematoma. Disc levels: Very mild multilevel degenerative disc  disease and facet arthropathy. Upper chest: Bilateral apical pleuroparenchymal thickening and architectural distortion, most compatible with areas of chronic post infectious or inflammatory scarring. Other: None. IMPRESSION: 1. No evidence of significant acute traumatic injury to the skull, brain or cervical spine. 2. Mild cerebral atrophy with chronic microvascular ischemic changes in the cerebral white matter. 3. Very mild multilevel degenerative disc disease and facet arthropathy. Electronically Signed   By: Vinnie Langton M.D.   On: 08/19/2020 18:42   MR ANGIO HEAD WO CONTRAST  Result Date: 08/28/2020 CLINICAL DATA:  Stroke follow-up. Acute right basal ganglia infarct on earlier MRI. EXAM: MRA HEAD WITHOUT CONTRAST TECHNIQUE: Angiographic images of the Circle of Willis were obtained using MRA technique without intravenous contrast. COMPARISON:  None. FINDINGS: The visualized distal vertebral arteries are widely patent to the basilar with the left being strongly dominant. Patent AICA and SCA origins are identified bilaterally. The basilar artery is widely patent. Posterior communicating arteries are not identified and may be diminutive or absent. The PCAs are patent without evidence of a significant proximal stenosis. There are distal cervical internal carotid artery loops bilaterally. The intracranial internal carotid arteries are patent without evidence of significant stenosis allowing for motion artifact through the anterior genu of the left ICA. ACAs and MCAs are patent without evidence of a proximal branch occlusion or significant M1 stenosis. Assessment for branch vessel stenosis is limited by motion. There is a moderate stenosis versus motion artifact in the distal left A1 segment. No aneurysm is identified. IMPRESSION: 1. No large vessel occlusion. 2. Moderate left A1 stenosis versus motion artifact. Electronically Signed   By: Logan Bores M.D.   On: 08/28/2020 03:11   MR BRAIN WO  CONTRAST  Result Date: 09/02/2020 CLINICAL DATA:  Myelodysplastic syndrome. Acute presentation with confusion. Weakness. EXAM: MRI HEAD WITHOUT CONTRAST TECHNIQUE: Multiplanar, multiecho pulse sequences of the brain and surrounding structures were obtained without intravenous contrast. COMPARISON:  08/28/2020 FINDINGS: Brain: Subcentimeter acute infarction within the putamen on the right is unchanged since the prior exam. No new supratentorial finding. Newly seen today are 5-7 punctate foci of acute infarctions scattered within the inferior cerebellum on both sides. No large confluent infarction. Elsewhere, chronic small-vessel ischemic changes are seen within the deep and subcortical white matter of both cerebral hemispheres. No large vessel territory infarction. No mass, hemorrhage, hydrocephalus or extra-axial collection. Vascular: Major vessels at the base of the brain show flow. Skull and upper cervical spine: Negative Sinuses/Orbits: Clear/normal Other: None IMPRESSION: 1. Unchanged subcentimeter acute infarction within the putamen on the right. 2. Newly seen 5-7 punctate foci of acute infarctions scattered within the inferior cerebellum on both sides. No large confluent infarction. Being a different vascular territory, this suggests embolic disease from the heart or ascending aorta. 3. Chronic small-vessel ischemic changes elsewhere throughout the brain as outlined above. 4. Findings discussed with hospitalist physician at the time of interpretation. Electronically Signed   By: Nelson Chimes M.D.   On: 09/02/2020 14:31   MR BRAIN WO CONTRAST  Result Date: 08/28/2020 CLINICAL DATA:  Dizziness.  Memory loss. EXAM: MRI HEAD WITHOUT CONTRAST TECHNIQUE: Multiplanar, multiecho pulse sequences of the brain and surrounding structures were obtained without intravenous contrast. COMPARISON:  Head CT 08/19/2020 FINDINGS: Brain: There is a 4 mm acute infarct at the anteroinferior aspect of the right basal ganglia.  Small T2 hyperintensities scattered throughout the cerebral white matter bilaterally are nonspecific but compatible with mild chronic small vessel ischemic disease. Two small adjacent chronic hemorrhages are noted in the subcortical white matter of the left frontal lobe. Mild cerebral atrophy is within normal limits for age. Vascular: Major intracranial vascular flow voids are preserved. Skull and upper cervical spine: Unremarkable bone marrow signal. Sinuses/Orbits: Bilateral cataract extraction. Paranasal sinuses and mastoid air cells are clear. Other: None. IMPRESSION: 1. Acute right basal ganglia lacunar infarct. 2. Mild chronic small vessel ischemic disease. Electronically Signed   By: Logan Bores M.D.   On: 08/28/2020 01:18   CT ABDOMEN PELVIS W CONTRAST  Result Date: 08/19/2020 CLINICAL DATA:  Right lower quadrant abdominal pain. Fall at home. Dizziness. EXAM: CT ABDOMEN AND PELVIS WITH CONTRAST TECHNIQUE: Multidetector CT imaging of the abdomen and pelvis was performed using the standard protocol following bolus administration of intravenous contrast. CONTRAST:  190mL OMNIPAQUE IOHEXOL 300 MG/ML  SOLN COMPARISON:  None. FINDINGS: Lower chest: Right middle lobe bronchiectasis with distal mucous plugging. Reticulonodular opacities in the lingula. Subsegmental opacities in the left lower lobe likely atelectasis. No pleural fluid. No pericardial fluid. No basilar rib fracture. Hepatobiliary: Elongated right lobe of the liver. No focal hepatic abnormality. No evidence of perihepatic hematoma or hepatic injury. Distended gallbladder without pericholecystic inflammation or calcified gallstone. No biliary dilatation. Pancreas: No ductal dilatation or inflammation. Spleen: Subcentimeter hypodensity in the anterior spleen is nonspecific, typically benign. No splenomegaly. No splenic injury or perisplenic hematoma. Adrenals/Urinary Tract: No renal or adrenal injury. There is a 14 mm left adrenal nodule. Lobulated  bilateral renal contours. Bilateral cortical cysts. Symmetric prominence of both renal pelvis likely extrarenal pelvis configuration. No perinephric edema. No renal calculi. The urinary bladder is distended. No bladder wall thickening or injury. Stomach/Bowel: Tiny hiatal hernia. The stomach is nondistended. No bowel obstruction, inflammation, or evidence of injury. Sigmoid colon is redundant. Small volume of colonic stool. The appendix is not confidently visualized. Vascular/Lymphatic: Aortic atherosclerosis. Patent portal vein. No acute vascular findings or evidence of injury. No adenopathy. Reproductive:  Status post hysterectomy. No adnexal masses. Other: No free air or free fluid. Postsurgical changes the anterior abdominal wall. No body wall hernia. Musculoskeletal: No acute fracture of the pelvis, lumbar spine, or included lower ribs. Multilevel degenerative change with vacuum phenomena and facet hypertrophy in the lumbar spine. IMPRESSION: 1. The appendix is not visualized, there is no evidence of appendicitis. 2. No acute traumatic injury to the abdomen or pelvis. 3. Distended gallbladder without pericholecystic inflammation or calcified gallstone. If there is clinical concern for acute gallbladder pathology, recommend right upper quadrant ultrasound. 4. Right middle lobe bronchiectasis with distal mucous plugging. Reticulonodular opacities in the lingula. Findings suggest prior mycobacterium avium infection. 5. Left adrenal nodule measuring 14 mm, nonspecific. In the absence of known malignancy, this is likely benign. Aortic Atherosclerosis (ICD10-I70.0). Electronically Signed   By: Keith Rake M.D.   On: 08/19/2020 18:43   DG Pelvis Portable  Result Date: 08/19/2020 CLINICAL DATA:  Weakness. Fall today. EXAM: PORTABLE PELVIS 1-2 VIEWS COMPARISON:  Pelvis and right hip radiograph on 07/16/2018 FINDINGS: The cortical margins of the bony pelvis are intact. No fracture. Pubic symphysis and sacroiliac  joints are congruent. Bilateral acetabular spurring. Both femoral heads are well-seated in the respective acetabula. IMPRESSION: No pelvic fracture. Electronically Signed   By: Keith Rake M.D.   On: 08/19/2020 15:26   DG CHEST PORT 1 VIEW  Result Date: 08/28/2020 CLINICAL DATA:  Fever EXAM: PORTABLE CHEST 1 VIEW COMPARISON:  08/19/2020 FINDINGS: Borderline heart size. Blunting at the lateral left costophrenic sulcus that is stable and most likely from mediastinal fat. Reticulation of lung markings. There is signs of chronic indolent infection/scarring in the lower lungs on recent abdominal CT. There is no edema, consolidation, effusion, or pneumothorax. IMPRESSION: Stable from prior.  No acute finding. Electronically Signed   By: Monte Fantasia M.D.   On: 08/28/2020 07:05   EEG adult  Result Date: 08/28/2020 Lora Havens, MD     08/28/2020 12:15 PM Patient Name: Kara Griffin MRN: [id number] Epilepsy Attending: Lora Havens Referring Physician/Provider: Dr Antony Contras Date: 08/28/2020 Duration: 24.43 mins Patient history: 85yo F with R basal ganglia infarct and cognitive impairment. EEG to evaluate for seizure. Level of alertness: Awake AEDs during EEG study: None Technical aspects: This EEG study was done with scalp electrodes positioned according to the 10-20 International system of electrode placement. Electrical activity was acquired at a sampling rate of 500Hz  and reviewed with a high frequency filter of 70Hz  and a low frequency filter of 1Hz . EEG data were recorded continuously and digitally stored. Description: The posterior dominant rhythm consists of 8 Hz activity of moderate voltage (25-35 uV) seen predominantly in posterior head regions, symmetric and reactive to eye opening and eye closing. EEG showed intermittent generalized 3 to 6 Hz theta-delta slowing.  Hyperventilation and photic stimulation were not performed.   ABNORMALITY -Intermittent slow, generalized  IMPRESSION: This study is suggestive of mild diffuse encephalopathy, nonspecific etiology. No seizures or epileptiform discharges were seen throughout the recording. Lora Havens   ECHOCARDIOGRAM COMPLETE  Result Date: 08/28/2020    ECHOCARDIOGRAM REPORT   Patient Name:   Kara Griffin Date of Exam: 08/28/2020 Medical Rec #:  [id number]              Height:       61.0 in Accession #:    [id number]             Weight:       159.0  lb Date of Birth:  February 11, 1936              BSA:          1.713 m Patient Age:    4 years               BP:           118/49 mmHg Patient Gender: F                      HR:           88 bpm. Exam Location:  Inpatient Procedure: 2D Echo, Cardiac Doppler and Color Doppler Indications:    Stroke 434.91 / I163.9  History:        Patient has no prior history of Echocardiogram examinations.                 Risk Factors:Hypertension and Diabetes.  Sonographer:    Bernadene Person RDCS Referring Phys: [place]  1. Left ventricular ejection fraction, by estimation, is >75%. The left ventricle has normal function. The left ventricle has no regional wall motion abnormalities. Left ventricular diastolic parameters are consistent with Grade I diastolic dysfunction (impaired relaxation).  2. Right ventricular systolic function is normal. The right ventricular size is normal. There is mildly elevated pulmonary artery systolic pressure.  3. The mitral valve is normal in structure. No evidence of mitral valve regurgitation. No evidence of mitral stenosis.  4. The aortic valve is tricuspid. There is mild calcification of the aortic valve. There is mild thickening of the aortic valve. Aortic valve regurgitation is not visualized. No aortic stenosis is present.  5. The inferior vena cava is normal in size with greater than 50% respiratory variability, suggesting right atrial pressure of 3 mmHg. FINDINGS  Left Ventricle: Left ventricular ejection fraction, by  estimation, is >75%. The left ventricle has normal function. The left ventricle has no regional wall motion abnormalities. The left ventricular internal cavity size was normal in size. There is no left ventricular hypertrophy. Left ventricular diastolic parameters are consistent with Grade I diastolic dysfunction (impaired relaxation). Normal left ventricular filling pressure. Right Ventricle: The right ventricular size is normal. No increase in right ventricular wall thickness. Right ventricular systolic function is normal. There is mildly elevated pulmonary artery systolic pressure. The tricuspid regurgitant velocity is 2.96  m/s, and with an assumed right atrial pressure of 3 mmHg, the estimated right ventricular systolic pressure is [id number] mmHg. Left Atrium: Left atrial size was normal in size. Right Atrium: Right atrial size was normal in size. Pericardium: There is no evidence of pericardial effusion. Mitral Valve: The mitral valve is normal in structure. No evidence of mitral valve regurgitation. No evidence of mitral valve stenosis. Tricuspid Valve: The tricuspid valve is normal in structure. Tricuspid valve regurgitation is mild . No evidence of tricuspid stenosis. Aortic Valve: The aortic valve is tricuspid. There is mild calcification of the aortic valve. There is mild thickening of the aortic valve. There is mild aortic valve annular calcification. Aortic valve regurgitation is not visualized. No aortic stenosis  is present. Aortic valve mean gradient measures 5.4 mmHg. Aortic valve peak gradient measures 11.3 mmHg. Aortic valve area, by VTI measures 2.52 cm. Pulmonic Valve: The pulmonic valve was not well visualized. Pulmonic valve regurgitation is not visualized. No evidence of pulmonic stenosis. Aorta: The aortic root is normal in size and structure. Pulmonary Artery: 35. Venous: The inferior vena cava is normal  in size with greater than 50% respiratory variability, suggesting right atrial pressure of  3 mmHg. IAS/Shunts: No atrial level shunt detected by color flow Doppler.  LEFT VENTRICLE PLAX 2D LVIDd:         4.00 cm  Diastology LVIDs:         1.90 cm  LV e' medial:    7.34 cm/s LV PW:         1.00 cm  LV E/e' medial:  11.9 LV IVS:        1.00 cm  LV e' lateral:   6.09 cm/s LVOT diam:     2.10 cm  LV E/e' lateral: 14.4 LV SV:         72 LV SV Index:   42 LVOT Area:     3.46 cm  RIGHT VENTRICLE RV S prime:     18.10 cm/s TAPSE (M-mode): 1.9 cm LEFT ATRIUM             Index       RIGHT ATRIUM           Index LA diam:        2.60 cm 1.52 cm/m  RA Area:     12.40 cm LA Vol (A2C):   32.6 ml 19.03 ml/m RA Volume:   28.30 ml  16.52 ml/m LA Vol (A4C):   42.1 ml 24.57 ml/m LA Biplane Vol: 36.8 ml 21.48 ml/m  AORTIC VALVE AV Area (Vmax):    2.61 cm AV Area (Vmean):   2.72 cm AV Area (VTI):     2.52 cm AV Vmax:           168.33 cm/s AV Vmean:          109.711 cm/s AV VTI:            0.286 m AV Peak Grad:      11.3 mmHg AV Mean Grad:      5.4 mmHg LVOT Vmax:         127.00 cm/s LVOT Vmean:        86.300 cm/s LVOT VTI:          0.208 m LVOT/AV VTI ratio: 0.73  AORTA Ao Root diam: 3.00 cm Ao Asc diam:  3.00 cm MITRAL VALVE                TRICUSPID VALVE MV Area (PHT): 3.77 cm     TR Peak grad:   35.0 mmHg MV Decel Time: 201 msec     TR Vmax:        296.00 cm/s MV E velocity: 87.50 cm/s MV A velocity: 158.00 cm/s  SHUNTS MV E/A ratio:  0.55         Systemic VTI:  0.21 m                             Systemic Diam: 2.10 cm Carlyle Dolly MD Electronically signed by Carlyle Dolly MD Signature Date/Time: 08/28/2020/8:53:08 AM    Final    CT RENAL STONE STUDY  Result Date: 08/28/2020 CLINICAL DATA:  Flank pain and fevers EXAM: CT ABDOMEN AND PELVIS WITHOUT CONTRAST TECHNIQUE: Multidetector CT imaging of the abdomen and pelvis was performed following the standard protocol without IV contrast. COMPARISON:  08/19/2020 FINDINGS: Lower chest: Lung bases again demonstrates some bronchiectatic changes in the right  middle lobe and lingula similar to that seen on prior study. No new focal infiltrate is seen. Hepatobiliary: No  focal liver abnormality is seen. No gallstones, gallbladder wall thickening, or biliary dilatation. Pancreas: Unremarkable. No pancreatic ductal dilatation or surrounding inflammatory changes. Spleen: Normal in size without focal abnormality. Adrenals/Urinary Tract: Adrenal glands are within normal limits. Kidneys are well visualized bilaterally. No renal calculi or obstructive changes are seen. Small cyst is noted in the upper pole of the right kidney. The bladder is well distended. Stomach/Bowel: The appendix is not well visualized and may have been surgically removed. No obstructive or inflammatory changes of the large or small bowel are seen. Stomach is decompressed. Vascular/Lymphatic: Aortic atherosclerosis. No enlarged abdominal or pelvic lymph nodes. Reproductive: Status post hysterectomy. No adnexal masses. Other: No abdominal wall hernia or abnormality. No abdominopelvic ascites. Musculoskeletal: No acute or significant osseous findings. IMPRESSION: Changes similar to that seen on prior exam. No acute abnormality is noted. Electronically Signed   By: Inez Catalina M.D.   On: 08/28/2020 09:15   VAS US CAROTID (at [facility] and [facility] only)  Result Date: 08/29/2020 Carotid Arterial Duplex Study Indications:  Recent fall with progressive decline of mental status. Risk Factors: Hypertension, hyperlipidemia, Diabetes. Performing Technologist: Rogelia Rohrer  Examination Guidelines: A complete evaluation includes B-mode imaging, spectral Doppler, color Doppler, and power Doppler as needed of all accessible portions of each vessel. Bilateral testing is considered an integral part of a complete examination. Limited examinations for reoccurring indications may be performed as noted.  Right Carotid Findings: +----------+--------+--------+--------+------------------+--------+           PSV cm/sEDV  cm/sStenosisPlaque DescriptionComments +----------+--------+--------+--------+------------------+--------+ CCA Prox  97      5                                          +----------+--------+--------+--------+------------------+--------+ CCA Distal122     28                                         +----------+--------+--------+--------+------------------+--------+ ICA Prox  87      23      1-39%                              +----------+--------+--------+--------+------------------+--------+ ECA       112     0                                          +----------+--------+--------+--------+------------------+--------+ +----------+--------+-------+----------------+-------------------+           PSV cm/sEDV cmsDescribe        Arm Pressure (mmHG) +----------+--------+-------+----------------+-------------------+ [id number]     0      Multiphasic, WNL                    +----------+--------+-------+----------------+-------------------+ +---------+--------+--+--------+--+---------+ VertebralPSV cm/s58EDV cm/s10Antegrade +---------+--------+--+--------+--+---------+  Left Carotid Findings: +----------+--------+--------+--------+------------------+--------+           PSV cm/sEDV cm/sStenosisPlaque DescriptionComments +----------+--------+--------+--------+------------------+--------+ CCA Prox  104     0                                          +----------+--------+--------+--------+------------------+--------+ CCA Distal100  12                                         +----------+--------+--------+--------+------------------+--------+ ICA Prox  53      12      1-39%                              +----------+--------+--------+--------+------------------+--------+ ICA Distal77      17                                         +----------+--------+--------+--------+------------------+--------+ ECA       63      0                                           +----------+--------+--------+--------+------------------+--------+ +----------+--------+--------+----------------+-------------------+           PSV cm/sEDV cm/sDescribe        Arm Pressure (mmHG) +----------+--------+--------+----------------+-------------------+ [id number]      0       Multiphasic, WNL                    +----------+--------+--------+----------------+-------------------+ +---------+--------+--+--------+--+---------+ VertebralPSV cm/s65EDV cm/s17Antegrade +---------+--------+--+--------+--+---------+   Summary: Right Carotid: Velocities in the right ICA are consistent with a 1-39% stenosis. Left Carotid: Velocities in the left ICA are consistent with a 1-39% stenosis. Vertebrals:  Bilateral vertebral arteries demonstrate antegrade flow. Subclavians: Normal flow hemodynamics were seen in bilateral subclavian              arteries. *See table(s) above for measurements and observations.  Electronically signed by Servando Snare MD on 08/29/2020 at 7:59:00 AM.    Final    VAS [place] LOWER EXTREMITY VENOUS (DVT)  Result Date: 09/03/2020  Lower Venous DVT Study Other Indications: CVA (cryptogenic). Performing Technologist: Rogelia Rohrer  Examination Guidelines: A complete evaluation includes B-mode imaging, spectral Doppler, color Doppler, and power Doppler as needed of all accessible portions of each vessel. Bilateral testing is considered an integral part of a complete examination. Limited examinations for reoccurring indications may be performed as noted. The reflux portion of the exam is performed with the patient in reverse Trendelenburg.  +---------+---------------+---------+-----------+----------+--------------+ RIGHT    CompressibilityPhasicitySpontaneityPropertiesThrombus Aging +---------+---------------+---------+-----------+----------+--------------+ CFV      Full           Yes      Yes                                  +---------+---------------+---------+-----------+----------+--------------+ SFJ      Full                                                        +---------+---------------+---------+-----------+----------+--------------+ FV Prox  Full           Yes      Yes                                 +---------+---------------+---------+-----------+----------+--------------+  FV Mid   Full           Yes      Yes                                 +---------+---------------+---------+-----------+----------+--------------+ FV DistalFull           Yes      Yes                                 +---------+---------------+---------+-----------+----------+--------------+ PFV      Full                                                        +---------+---------------+---------+-----------+----------+--------------+ POP      Full           Yes      Yes                                 +---------+---------------+---------+-----------+----------+--------------+ PTV      Full                                                        +---------+---------------+---------+-----------+----------+--------------+ PERO     Full                                                        +---------+---------------+---------+-----------+----------+--------------+   +---------+---------------+---------+-----------+----------+--------------+ LEFT     CompressibilityPhasicitySpontaneityPropertiesThrombus Aging +---------+---------------+---------+-----------+----------+--------------+ CFV      Full           Yes      Yes                                 +---------+---------------+---------+-----------+----------+--------------+ SFJ      Full                                                        +---------+---------------+---------+-----------+----------+--------------+ FV Prox  Full           Yes      Yes                                  +---------+---------------+---------+-----------+----------+--------------+ FV Mid   Full           Yes      Yes                                 +---------+---------------+---------+-----------+----------+--------------+ FV DistalFull  Yes      Yes                                 +---------+---------------+---------+-----------+----------+--------------+ PFV      Full                                                        +---------+---------------+---------+-----------+----------+--------------+ POP      Full           Yes      Yes                                 +---------+---------------+---------+-----------+----------+--------------+ PTV      Full                                                        +---------+---------------+---------+-----------+----------+--------------+ PERO     Full                                                        +---------+---------------+---------+-----------+----------+--------------+     Summary: RIGHT: - There is no evidence of deep vein thrombosis in the lower extremity.  - No cystic structure found in the popliteal fossa.  LEFT: - There is no evidence of deep vein thrombosis in the lower extremity.  - No cystic structure found in the popliteal fossa.  *See table(s) above for measurements and observations. Electronically signed by Servando Snare MD on 09/03/2020 at 8:20:27 PM.    Final    US Abdomen Limited RUQ (LIVER/GB)  Result Date: 08/28/2020 CLINICAL DATA:  Nausea and vomiting. EXAM: ULTRASOUND ABDOMEN LIMITED RIGHT UPPER QUADRANT COMPARISON:  CT scan 08/19/2020 FINDINGS: Gallbladder: No gallstones or gallbladder wall thickening. No pericholecystic fluid. The sonographer reports no sonographic Murphy's sign. Common bile duct: Diameter: 1-2 mm Liver: No focal lesion identified. Within normal limits in parenchymal echogenicity. Portal vein is patent on color Doppler imaging with normal direction of blood flow towards  the liver. Other: None. IMPRESSION: No acute findings. No evidence to explain the patient's history of nausea and vomiting. Electronically Signed   By: Misty Stanley M.D.   On: 08/28/2020 08:04     ASSESSMENT AND PLAN:  Ms. Dewalt is an 85 year old female with a longstanding history of myelodysplastic syndrome with 5q deletion.  She has been maintained on Revlimid 10 mg daily since 2006.  She has tolerated this medication well overall. Revlimid currently on hold given her inability to swallow this medication.  Also, there is concern that Revlimid increases risk for CVA/clots.  Pancytopenia has worsened this admission.  She is now admitted with CVA and acute metabolic encephalopathy.  Etiology of the encephalopathy is unclear.  Neurology has been asked to reconsult and this consult is pending.  ##Myelodysplastic syndrome --Patient has longstanding history of MDS with 5q deletion-followed by Dr. Florene Glen at [facility] --She has been maintained  on Revlimid 10 mg daily for many years and tolerated this medication well --Now admitted with acute CVA and metabolic encephalopathy --Discussed with son at the bedside that recommended can increase risk for stroke and blood clots --The patient has missed several doses of Revlimid because she is unable to swallow this medication --Son has inquired about other ways to administer this medication.  I have reached out to our pharmacy for additional information.  Preliminary information indicates that there is limited information about alternative methods of administration.  They are reaching out to the drug manufacturer for additional information. --Given that the patient cannot swallow medication at this time and increased risk for CVA/clots, would recommend holding Revlimid for now.  ##Acute basal ganglia lacunar infarct/punctate foci of acute infarcts within the inferior cerebellum --The patient has previously been evaluated by neurology who recommends aspirin  81 mg daily and Plavix 75 mg daily for 3 weeks and then aspirin alone --Infarcts are thought to be an incidental finding and unlikely to be the cause of her encephalopathy --Neurology reconsult is pending.  ##Acute metabolic encephalopathy --Etiology of encephalopathy remains unclear --Infectious work-up is negative --Neurology has been reconsulted   Thank you for this referral.  Mikey Bussing, DNP, AGPCNP-BC, AOCNP

## 2020-09-08 ENCOUNTER — Inpatient Hospital Stay (HOSPITAL_COMMUNITY): Payer: Medicare PPO

## 2020-09-08 DIAGNOSIS — G9340 Encephalopathy, unspecified: Secondary | ICD-10-CM | POA: Diagnosis not present

## 2020-09-08 LAB — BASIC METABOLIC PANEL
Anion gap: 14 (ref 5–15)
BUN: 19 mg/dL (ref 8–23)
CO2: 22 mmol/L (ref 22–32)
Calcium: 9.1 mg/dL (ref 8.9–10.3)
Chloride: 97 mmol/L — ABNORMAL LOW (ref 98–111)
Creatinine, Ser: 0.67 mg/dL (ref 0.44–1.00)
GFR, Estimated: >60 mL/min (ref >60)
Glucose, Bld: 155 mg/dL — ABNORMAL HIGH (ref 70–99)
Potassium: 3.1 mmol/L — ABNORMAL LOW (ref 3.5–5.1)
Sodium: 133 mmol/L — ABNORMAL LOW (ref 135–145)

## 2020-09-08 LAB — CBC WITH DIFFERENTIAL/PLATELET
Abs Immature Granulocytes: 0 10*3/uL (ref 0.00–0.07)
Basophils Absolute: 0 10*3/uL (ref 0.0–0.1)
Basophils Relative: 3 %
Eosinophils Absolute: 0 10*3/uL (ref 0.0–0.5)
Eosinophils Relative: 2 %
HCT: 29.7 % — ABNORMAL LOW (ref 36.0–46.0)
Hemoglobin: 10 g/dL — ABNORMAL LOW (ref 12.0–15.0)
Immature Granulocytes: 0 %
Lymphocytes Relative: 6 %
Lymphs Abs: 0.1 10*3/uL — ABNORMAL LOW (ref 0.7–4.0)
MCH: 29.2 pg (ref 26.0–34.0)
MCHC: 33.7 g/dL (ref 30.0–36.0)
MCV: 86.6 fL (ref 80.0–100.0)
Monocytes Absolute: 0.4 10*3/uL (ref 0.1–1.0)
Monocytes Relative: 23 %
Neutro Abs: 1 10*3/uL — ABNORMAL LOW (ref 1.7–7.7)
Neutrophils Relative %: 66 %
Platelets: 82 10*3/uL — ABNORMAL LOW (ref 150–400)
RBC: 3.43 MIL/uL — ABNORMAL LOW (ref 3.87–5.11)
RDW: 15.3 % (ref 11.5–15.5)
WBC: 1.6 10*3/uL — ABNORMAL LOW (ref 4.0–10.5)
nRBC: 0 % (ref 0.0–0.2)

## 2020-09-08 LAB — GLUCOSE, CAPILLARY
Glucose-Capillary: 141 mg/dL — ABNORMAL HIGH (ref 70–99)
Glucose-Capillary: 175 mg/dL — ABNORMAL HIGH (ref 70–99)
Glucose-Capillary: 171 mg/dL — ABNORMAL HIGH (ref 70–99)
Glucose-Capillary: 158 mg/dL — ABNORMAL HIGH (ref 70–99)

## 2020-09-08 LAB — SARS CORONAVIRUS 2 (TAT 6-24 HRS): SARS Coronavirus 2: NEGATIVE

## 2020-09-08 MED ORDER — SODIUM CHLORIDE 0.9 % IV SOLN: INTRAVENOUS | Status: DC

## 2020-09-08 MED ORDER — POTASSIUM CHLORIDE 10 MEQ/100ML IV SOLN
10.0000 meq | INTRAVENOUS | Status: AC
Start: 2020-09-08 — End: 2020-09-08
  Administered 2020-09-08 (×4): 10 meq via INTRAVENOUS
  Filled 2020-09-08 (×4): qty 100

## 2020-09-08 MED ORDER — RESOURCE THICKENUP CLEAR PO POWD
ORAL | Status: DC | PRN
  Filled 2020-09-08: qty 125

## 2020-09-08 NOTE — Progress Notes (Signed)
Nutrition Follow-up  DOCUMENTATION CODES:   Not applicable  INTERVENTION:  Please obtain updated measured weight.   D/c Ensure  Vital Cuisine shake po TID with meals, each supplement provides 520 kcals and 22 grams of protein  Continue Magic cup TID with meals, each supplement provides 290 kcal and 9 grams of protein  Continue MVI with minerals daily  NUTRITION DIAGNOSIS:   Inadequate oral intake related to poor appetite as evidenced by per patient/family report.  ongoing  GOAL:   Patient will meet greater than or equal to 90% of their needs  Not met  MONITOR:   PO intake,Supplement acceptance,Weight trends,Labs,I & O's  REASON FOR ASSESSMENT:   Consult Assessment of nutrition requirement/status,Poor PO  ASSESSMENT:   Pt admitted with acute R basal ganglia lacunar infarct. PMH includes myelodysplastic syndrome, pancytopenia, DM, HTN, HLD.  Pt out of room for MBS at time of RD visit. Discussed pt with RN in detail who reports pt has been eating very little and not consuming supplements or medications due to swallowing difficulties. SLP recommending a dysphagia 1 diet with nectar thick liquids for pt.  Per MD, pt is ready for d/c to SNF once bed becomes available. Will order appropriate supplements for pt and continue to monitor adequacy of PO intake now that diet order has been adjusted.  PO Intake: 50% x 1 recorded meal (pt NPO 12/31-1/4)  Updated weight has yet to be obtained.   UOP: 646m x24 hours  Labs: Na 133 (L), K+ 3.1 (L), CBGs 132-175 Medications: ss novolog TID w/ meals, mvi with minerals, miralax, senokot-s, vitamin B12, IV KCl 145m Q1H x 4H  Diet Order:   Diet Order            DIET - DYS 1 Room service appropriate? Yes; Fluid consistency: Nectar Thick  Diet effective now           Diet - low sodium heart healthy                 EDUCATION NEEDS:   No education needs have been identified at this time  Skin:  Skin Assessment: Skin  Integrity Issues: Skin Integrity Issues:: Stage II Stage II: R buttocks  Last BM:  12/31  Height:   Ht Readings from Last 1 Encounters:  08/25/20 _0  (1.549 m)    Weight:   Wt Readings from Last 1 Encounters:  08/25/20 72.1 kg    BMI:  There is no height or weight on file to calculate BMI.  Estimated Nutritional Needs:   Kcal:  1600-1800  Protein:  80-95 grams  Fluid:  >1.6L/d    AmLarkin InaMS, RD, LDN RD pager number and weekend/on-call pager number located in [facility]

## 2020-09-08 NOTE — TOC Progression Note (Signed)
Transition of Care [facility]) - Progression Note    Patient Details  Name: Kara Griffin MRN: [id number] Date of Birth: 1936/03/10  Transition of [facility] CM/SW Contact  Eduard Roux, [place] Phone Number: 09/08/2020, 2:09 PM  Clinical Narrative:     CSW spoke with patient's son,Kara Griffin, confirmed they are still interested in bed at [facility]. CSW semt message to [facility] to informed of anticipated discharge tomorrow.  CSW restarted Fish farm manager # [phone].   MD-updated and covid test requested   TOC will continue to follow and assist with discharge planning.  Antony Blackbird, MSW, LCSW Clinical Social Worker   Expected Discharge Plan: Skilled Nursing Facility Barriers to Discharge: SNF Pending bed offer,Insurance Authorization,Continued Medical Work up  Expected Discharge Plan and Services Expected Discharge Plan: Skilled Nursing Facility     Post Acute Care Choice: Skilled Nursing Facility Living arrangements for the past 2 months: Single Family Home Expected Discharge Date: 09/05/20                                     Social Determinants of Health (SDOH) Interventions    Readmission Risk Interventions No flowsheet data found.

## 2020-09-08 NOTE — Plan of Care (Signed)
  Problem: Education: Goal: Knowledge of secondary prevention will improve 09/08/2020 0313 by Treasa School, RN Outcome: Progressing 09/08/2020 0308 by Treasa School, RN Outcome: Progressing   Problem: Education: Goal: Knowledge of General Education information will improve Description: Including pain rating scale, medication(s)/side effects and non-pharmacologic comfort measures Outcome: Progressing   Problem: Health Behavior/Discharge Planning: Goal: Ability to manage health-related needs will improve Outcome: Progressing   Problem: Clinical Measurements: Goal: Ability to maintain clinical measurements within normal limits will improve Outcome: Progressing Goal: Will remain free from infection Outcome: Progressing Goal: Diagnostic test results will improve Outcome: Progressing Goal: Respiratory complications will improve Outcome: Progressing Goal: Cardiovascular complication will be avoided Outcome: Progressing   Problem: Activity: Goal: Risk for activity intolerance will decrease Outcome: Progressing   Problem: Nutrition: Goal: Adequate nutrition will be maintained Outcome: Progressing   Problem: Coping: Goal: Level of anxiety will decrease Outcome: Progressing   Problem: Elimination: Goal: Will not experience complications related to bowel motility Outcome: Progressing Goal: Will not experience complications related to urinary retention Outcome: Progressing   Problem: Pain Managment: Goal: General experience of comfort will improve Outcome: Progressing   Problem: Safety: Goal: Ability to remain free from injury will improve Outcome: Progressing   Problem: Skin Integrity: Goal: Risk for impaired skin integrity will decrease Outcome: Progressing   Problem: Education: Goal: Knowledge of disease or condition will improve Outcome: Progressing Goal: Knowledge of secondary prevention will improve Outcome: Progressing Goal: Knowledge of patient  specific risk factors addressed and post discharge goals established will improve Outcome: Progressing Goal: Individualized Educational Video(s) Outcome: Progressing   Problem: Coping: Goal: Will verbalize positive feelings about self Outcome: Progressing Goal: Will identify appropriate support needs Outcome: Progressing   Problem: Health Behavior/Discharge Planning: Goal: Ability to manage health-related needs will improve Outcome: Progressing   Problem: Self-Care: Goal: Ability to participate in self-care as condition permits will improve Outcome: Progressing Goal: Verbalization of feelings and concerns over difficulty with self-care will improve Outcome: Progressing Goal: Ability to communicate needs accurately will improve Outcome: Progressing   Problem: Nutrition: Goal: Risk of aspiration will decrease Outcome: Progressing Goal: Dietary intake will improve Outcome: Progressing   Problem: Ischemic Stroke/TIA Tissue Perfusion: Goal: Complications of ischemic stroke/TIA will be minimized Outcome: Progressing

## 2020-09-08 NOTE — Progress Notes (Signed)
Physical Therapy Treatment Patient Details Name: Kara Griffin MRN: [id number] DOB: 1936/08/05 Today's Date: 09/08/2020    History of Present Illness 85 y.o. female with history of myelodysplastic syndrome being followed by Dr. Lowell Guitar at [facility] on Revlimid, pancytopenia, diabetes mellitus, hypertension and hyperlipidemia was brought to the ED by patient's son after patient was found to be increasingly confused last 2 days. On August 19, 2020, patient was taken to [facility] after patient had a fall at home. CT and xray were negative during that ED visit and pt was d/c'd home. Pt underwent MRI 12/23 which revealed small R bagal ganglia lacunar infarct. MRI 09/02/20 revealed newly seen 5-7 punctate foci of acute infarctions scattered  within the inferior cerebellum on both sides. No large confluent  infarction. Being a different vascular territory, this suggests  embolic disease from the heart or ascending aorta.    PT Comments    Pt supine in bed on arrival this session.  Pt required assistance to mobilize and PTA/OTA initiating all activities.  Pt continues to require +2 assistance at this time.  Continue to recommend snf placement.   Follow Up Recommendations  SNF;Supervision/Assistance - 24 hour     Equipment Recommendations  Wheelchair (measurements PT);Wheelchair cushion (measurements PT);Hospital bed    Recommendations for Other Services       Precautions / Restrictions Precautions Precautions: Fall Restrictions Weight Bearing Restrictions: No    Mobility  Bed Mobility Overal bed mobility: Needs Assistance Bed Mobility: Rolling;Sidelying to Sit;Sit to Sidelying Rolling: Min assist;+2 for physical assistance Sidelying to sit: Max assist;+2 for physical assistance     Sit to sidelying: Total assist;+2 for physical assistance General bed mobility comments: Pt supine in bed required max to total assistance for bed mobility. Brief moments of ability to hold sitting  balance but required min-mod assistance overal for balance.  Pt performed seated ADLs with support for balance.  Transfers Overall transfer level: Needs assistance Equipment used: Rolling walker (2 wheeled);2 person hand held assist Transfers: Sit to/from Stand Sit to Stand: Max assist;+2 physical assistance         General transfer comment: PT must initiate all mobility, significant cueing for posterior lean, hips flexed and unable to achieve full erect stance.  Ambulation/Gait Ambulation/Gait assistance:  (NT unable to move feet in standing.)               Stairs             Wheelchair Mobility    Modified Rankin (Stroke Patients Only)       Balance Overall balance assessment: Needs assistance Sitting-balance support: Single extremity supported;Bilateral upper extremity supported Sitting balance-Leahy Scale: Zero Sitting balance - Comments: mod-maxA due to posterior lean Postural control: Posterior lean   Standing balance-Leahy Scale: Zero Standing balance comment: maxA with BUE support of PTA/OTA                            Cognition Arousal/Alertness: Lethargic Behavior During Therapy: Flat affect Overall Cognitive Status: Impaired/Different from baseline Area of Impairment: Following commands;Safety/judgement;Problem solving                       Following Commands: Follows one step commands with increased time;Follows one step commands inconsistently (difficulty terminating a task.) Safety/Judgement: Decreased awareness of safety;Decreased awareness of deficits   Problem Solving: Slow processing;Decreased initiation;Difficulty sequencing;Requires verbal cues;Requires tactile cues        Exercises  General Comments        Pertinent Vitals/Pain Pain Assessment: Faces Faces Pain Scale: Hurts little more Pain Location: patient unable to indicate but seemed to have generalized discomfort-son reports back spasms Pain  Descriptors / Indicators: Grimacing Pain Intervention(s): Monitored during session;Repositioned    Home Living                      Prior Function            PT Goals (current goals can now be found in the care plan section) Acute Rehab PT Goals Patient Stated Goal: to improve cognition and mobility Potential to Achieve Goals: Good Progress towards PT goals: Progressing toward goals    Frequency    Min 3X/week      PT Plan Current plan remains appropriate    Co-evaluation   Reason for Co-Treatment: Complexity of the patient's impairments (multi-system involvement) PT goals addressed during session: Mobility/safety with mobility OT goals addressed during session: ADL's and self-care      AM-PAC PT "6 Clicks" Mobility   Outcome Measure  Help needed turning from your back to your side while in a flat bed without using bedrails?: Total Help needed moving from lying on your back to sitting on the side of a flat bed without using bedrails?: Total Help needed moving to and from a bed to a chair (including a wheelchair)?: A Lot Help needed standing up from a chair using your arms (e.g., wheelchair or bedside chair)?: A Lot Help needed to walk in hospital room?: Total Help needed climbing 3-5 steps with a railing? : Total 6 Click Score: 8    End of Session Equipment Utilized During Treatment: Gait belt Activity Tolerance: Patient limited by lethargy Patient left: in bed;with call bell/phone within reach;with bed alarm set;with family/visitor present Nurse Communication: Mobility status;Need for lift equipment PT Visit Diagnosis: Unsteadiness on feet (R26.81);Other abnormalities of gait and mobility (R26.89);Muscle weakness (generalized) (M62.81);Pain;Difficulty in walking, not elsewhere classified (R26.2) Pain - Right/Left:  (back and abdomen)     Time: 2841-3244 PT Time Calculation (min) (ACUTE ONLY): 23 min  Charges:  $Therapeutic Activity: 8-22 mins                      Bonney Leitz , PTA Acute Rehabilitation Services Pager [phone] Office [phone]     Elan Mcelvain Artis Delay 09/08/2020, 3:45 PM

## 2020-09-08 NOTE — Progress Notes (Signed)
Modified Barium Swallow Progress Note  Patient Details  Name: Kara Griffin MRN: [id number] Date of Birth: 21-Feb-1936  Today's Date: 09/08/2020  Modified Barium Swallow completed.  Full report located under Chart Review in the Imaging Section.  Brief recommendations include the following:  Clinical Impression  Pt demonstrates a moderate oropharyngeal dysphagia, acutely worsened in function over the past week. Pt with prologned oral holding and manipulation of all textures with oral residue post swallow. Bolus reaches the pyriforms with thin and thick liquids, cup or straw with instances of silent trace aspiration before and during the swallow. Quantity and frequency of aspiration significantly decreased with nectar via cup. Pt additionally has moderate vallecular residue with all textures, suggesting base of tongue weakness. A liquid wash was helpful after solids, though cues for a second swallow were also needed.  Max cueing needed to elicit a swallow or throat clear. Recommend a puree diet and nectar thick liquids. Pt may upgrade solids when oral manipulation time is more reasonable.   Swallow Evaluation Recommendations       SLP Diet Recommendations: Dysphagia 1 (Puree) solids;Nectar thick liquid   Liquid Administration via: Cup;No straw   Medication Administration: Crushed with puree   Supervision: Staff to assist with self feeding;Full supervision/cueing for compensatory strategies   Compensations: Slow rate;Small sips/bites;Clear throat intermittently;Multiple dry swallows after each bite/sip       Oral Care Recommendations: Oral care BID   Other Recommendations: Order thickener from pharmacy;Have oral suction available   Kara Ditty, MA CCC-SLP  Acute Rehabilitation Services Pager [phone] Office [phone]  Claudine Mouton 09/08/2020,2:10 PM

## 2020-09-08 NOTE — Progress Notes (Signed)
  Speech Language Pathology Treatment: Dysphagia  Patient Details Name: Taelyr Jantz MRN: [id number] DOB: August 03, 1936 Today's Date: 09/08/2020 Time: 6256-3893 SLP Time Calculation (min) (ACUTE ONLY): 11 min  Assessment / Plan / Recommendation Clinical Impression  Pt alert, accepting sips and initiating swallow though multiple swallows and involuntary gagging like behavior still observed. Pt also with immediate cough after sips. Will f/u with MBS later today as pt seems alert and aware enough to participate.   HPI HPI: Mackinzee Roszak is a 85 y.o. female with PMH significant for DM2, HTN, Myelodysplastic syndrome, HLD who is admitted with dizziness and falls that have been progressively worsening for the last month. She was brought in by family for worsening confusion and now in her bed all day x 2 days.Son reports the patient has had some symptoms of gastroenteritis with nausea, has not thrown up and some diarrhea. MRI brain demonstrated an acute right basal ganglia lacunar infarct. Neurologist feels Stroke is an incidental finding, unrelated to waxing and waning mentation.      SLP Plan  MBS       Recommendations  Diet recommendations:  (ice chips)                Oral Care Recommendations: Oral care QID Follow up Recommendations: Skilled Nursing facility Plan: MBS       GO                Quanita Barona, Riley Nearing 09/08/2020, 10:23 AM

## 2020-09-08 NOTE — Progress Notes (Addendum)
PROGRESS NOTE    Kara Griffin  UVO:[id number]  DOB: [birth year]  DOA: 08/26/2020 PCP: Dale Murray, MD Outpatient Specialists:   Hospital course:  She is an 85 year old female with MDS DM2, HTN who was admitted 08/19/2020 with acute basal ganglia lacunar infarct secondary to small vessel disease. She completed a stroke work-up. MRA brain no large vessel obstruction. Moderate L A1 stenosis. Carotid Doppler ultrasound unremarkable. 2D echo EF 75%, no source of embolus. EEG suggestive of mild diffuse encephalopathy, nonspecific etiology. No seizures or epileptiform discharges were seen throughout the recording. LDL 35. Hemoglobin A1c 6.0.  Seen by neurology/stroke team with recommendation for aspirin 81 mg daily and Plavix 75 mg daily for 3 weeks followed by aspirin alone. PT OT recommended SNF.  MRI brain was done on 09/02/2020 which showednew 5-7 punctate foci of acute infarctions scattered within the inferior cerebellum on both sides, suggestive of embolic disease from the heart or ascending aorta.Cardiology was contacted for arrangement of 30-day cardiac event monitoring outpatient.  She had a CT a head and neck completed which was unremarkable. Bilateral lower extremity venous Doppler ultrasound was negative for DVT. Neurology signed off. She will follow-up outpatient at [facility] in about 4 weeks.  Hospital course complicated by delirium with waxing and waning altered mental status. Her sons strongly believes that she has a UTI.  Urine culture was negative.  She was given additional 3 days of antibiotics.  Lactic acid and procalcitonin negative.  Afebrile.  No evidence of active infective process.  DC'd antibiotics on 09/05/2020. Patient had a recurrence of altered mental status overnight.  CT head was done which was negative for any acute intracranial findings.  CBC with differential and BMP were ordered overnight which showed no significant difference from  previous.  Patient was noted to have worsened delirium on 09/05/20, day of discharge to SNF. Patient was worked up for secondary sources of delirium which was negative. Patient was seen by Neurology on 09/08/19 who note no further w/u is indicated and patient would benefit from strict delirium measures. Also of concern was patient's inability to take Revlimid orally for her MDS. Hematology/Oncology saw patient and recommended holding Revlimid until patient could take it orally. Speech therapy continues to work closely with patient.   Subjective:  Patient is still somnolent but she is more communicative. She attempted to answer questions in full sentences, voice was soft so difficult to understand but she was smiling and nodding appropriately.   Objective: Vitals:   09/07/20 0820 09/07/20 1223 09/07/20 2057 09/08/20 0826  BP: (!) 172/73 (!) 155/81 (!) 173/67 (!) 166/63  Pulse: 73 76 86 69  Resp: 18 18 18 16   Temp: 98.1 F (36.7 C) 97.7 F (36.5 C) (!) 97.4 F (36.3 C) 97.7 F (36.5 C)  TempSrc: Oral Oral Oral Oral  SpO2: 98% 98% 99% 97%    Intake/Output Summary (Last 24 hours) at 09/08/2020 1336 Last data filed at 09/08/2020 0234 Gross per 24 hour  Intake -  Output 325 ml  Net -325 ml   There were no vitals filed for this visit.   Exam:  General: Awake, smiling, still appears somewhat confused, attentive son at bedside. Eyes: sclera anicteric, conjuctiva mild injection bilaterally CVS: S1-S2, regular  Respiratory:  decreased air entry bilaterally secondary to decreased inspiratory effort, rales at bases  GI: NABS, soft, NT  LE: No edema.  Neuro: Acutely delirious   Assessment & Plan:   85 year old female was admitted for stroke and basal ganglia.  Posterior course has been complicated by metabolic encephalopathy, work-up has been unrevealing.  Patient continues to have difficulty swallowing and is working with speech therapy.  Patient is ready for discharge when bed is available  at [facility].  Acute metabolic encephalopathy Patient appears to be improving slowly but surely Seen by neurology yesterday who note delirium work-up is complete and is likely secondary to elderly patient, now hospitalized with progressive slow decline in functioning over the past month.Meds have been optimized, electrolytes have also been optimized, no evidence of infection noted. Strict delirium parameters were put in place. Of note patient did get 1 dose of Ativan prior to her MRI which was right before her decompensation. Would avoid further benzodiazepines in this patient.  MDS Revlimid is on hold until patient is able to take it orally She has been working with speech therapy who did a swallow study on her today and apparently was able to take it with thick liquids We will continue to hold Revlimid until speech clears her to start it.  Hypokalemia Persistent hypokalemia despite patient being on losartan and KCl 10 mEq orally However patient has not been able to take oral meds for a couple of days now Repeat potassium 3.1 today, will treat with IV KCl and recheck in the morning. Patient will need follow-up potassium check 2 to 3 days after discharge  HTN Patient's blood pressure has been rising most likely because she has been unable to take her oral antihypertensives.  Patient is on losartan 100 mg daily, anticipate patient being able to restart on that so we will not make any further changes at present.  She has as needed Lopressor ordered.  CVA Patient remains on aspirin and Plavix  DM 2 Blood sugar under good control under present management  Left thyroid nodule Can be followed up as an outpatient as warranted Patient son is aware    DVT prophylaxis: Lovenox Code Status: Full Family Communication: Multiple conversations with patient's son Kara Griffin Disposition Plan:   Patient is from: Home  Anticipated Discharge Location: SNF  Barriers to Discharge: Delirium  work-up  Is patient medically stable for Discharge: Yes  Consultants:  Neurology  Procedures:  MRI/MRA brain  EEG  Carotid Doppler  CT renal study  Right upper quadrant ultrasound  Antimicrobials:   Cefepime  Vancomycin  Rocephin    Data Reviewed:  Basic Metabolic Panel: Recent Labs  Lab 09/03/20 1017 09/04/20 0438 09/04/20 2145 09/06/20 1008 09/08/20 0557  NA 132* 135 133* 134* 133*  K 3.6 3.7 3.7 3.3* 3.1*  CL 98 99 97* 99 97*  CO2 26 27 26 24 22   GLUCOSE 212* 162* 168* 167* 155*  BUN 9 9 13 15 19   CREATININE 0.57 0.66 0.59 0.64 0.67  CALCIUM 8.4* 8.7* 8.8* 8.6* 9.1  MG 1.8  --   --   --   --    Liver Function Tests: Recent Labs  Lab 09/04/20 0438 09/04/20 2145 09/06/20 1008  AST 9* 11* 12*  ALT 13 12 14   ALKPHOS 65 65 62  BILITOT 0.5 0.4 0.6  PROT 5.4* 5.4* 5.6*  ALBUMIN 2.8* 2.9* 3.0*   No results for input(s): LIPASE, AMYLASE in the last 168 hours. No results for input(s): AMMONIA in the last 168 hours. CBC: Recent Labs  Lab 09/03/20 1017 09/04/20 0438 09/04/20 2145 09/06/20 1008 09/08/20 0557  WBC 1.7* 1.8* 1.7* 1.4* 1.6*  NEUTROABS 1.1* 1.2*  --  0.9* 1.0*  HGB 9.7* 9.6* 9.2* 10.1*  10.0*  HCT 27.2* 27.1* 26.0* 29.2* 29.7*  MCV 86.1 85.5 86.4 86.6 86.6  PLT 113* 113* 116* 93* 82*   Cardiac Enzymes: No results for input(s): CKTOTAL, CKMB, CKMBINDEX, TROPONINI in the last 168 hours. BNP (last 3 results) No results for input(s): PROBNP in the last 8760 hours. CBG: Recent Labs  Lab 09/07/20 1254 09/07/20 1640 09/07/20 2130 09/08/20 0557 09/08/20 1220  GLUCAP 159* 123* 132* 141* 175*    Recent Results (from the past 240 hour(s))  Resp Panel by RT-PCR (Flu A&B, Covid) Nasopharyngeal Swab     Status: None   Collection Time: 08/31/20 12:10 PM   Specimen: Nasopharyngeal Swab; Nasopharyngeal(NP) swabs in vial transport medium  Result Value Ref Range Status   SARS Coronavirus 2 by RT PCR NEGATIVE NEGATIVE Final     Comment: (NOTE) SARS-CoV-2 target nucleic acids are NOT DETECTED.  The SARS-CoV-2 RNA is generally detectable in upper respiratory specimens during the acute phase of infection. The lowest concentration of SARS-CoV-2 viral copies this assay can detect is 138 copies/mL. A negative result does not preclude SARS-Cov-2 infection and should not be used as the Griffin basis for treatment or other patient management decisions. A negative result may occur with  improper specimen collection/handling, submission of specimen other than nasopharyngeal swab, presence of viral mutation(s) within the areas targeted by this assay, and inadequate number of viral copies(<138 copies/mL). A negative result must be combined with clinical observations, patient history, and epidemiological information. The expected result is Negative.  Fact Sheet for Patients:  EntrepreneurPulse.com.au  Fact Sheet for Healthcare Providers:  IncredibleEmployment.be  This test is no t yet approved or cleared by the [place] FDA and  has been authorized for detection and/or diagnosis of SARS-CoV-2 by FDA under an Emergency Use Authorization (EUA). This EUA will remain  in effect (meaning this test can be used) for the duration of the COVID-19 declaration under Section 564(b)(1) of the Act, 21 U.S.C.section 360bbb-3(b)(1), unless the authorization is terminated  or revoked sooner.       Influenza A by PCR NEGATIVE NEGATIVE Final   Influenza B by PCR NEGATIVE NEGATIVE Final    Comment: (NOTE) The Xpert Xpress SARS-CoV-2/FLU/RSV plus assay is intended as an aid in the diagnosis of influenza from Nasopharyngeal swab specimens and should not be used as a Griffin basis for treatment. Nasal washings and aspirates are unacceptable for Xpert Xpress SARS-CoV-2/FLU/RSV testing.  Fact Sheet for Patients: EntrepreneurPulse.com.au  Fact Sheet for Healthcare  Providers: IncredibleEmployment.be  This test is not yet approved or cleared by the [place] FDA and has been authorized for detection and/or diagnosis of SARS-CoV-2 by FDA under an Emergency Use Authorization (EUA). This EUA will remain in effect (meaning this test can be used) for the duration of the COVID-19 declaration under Section 564(b)(1) of the Act, 21 U.S.C. section 360bbb-3(b)(1), unless the authorization is terminated or revoked.  Performed at [facility], [place] [address], [place], [place] [address]   SARS CORONAVIRUS 2 (TAT 6-24 HRS) Nasopharyngeal Nasopharyngeal Swab     Status: None   Collection Time: 09/01/20  4:38 PM   Specimen: Nasopharyngeal Swab  Result Value Ref Range Status   SARS Coronavirus 2 NEGATIVE NEGATIVE Final    Comment: (NOTE) SARS-CoV-2 target nucleic acids are NOT DETECTED.  The SARS-CoV-2 RNA is generally detectable in upper and lower respiratory specimens during the acute phase of infection. Negative results do not preclude SARS-CoV-2 infection, do not rule out co-infections with other pathogens,  and should not be used as the Griffin basis for treatment or other patient management decisions. Negative results must be combined with clinical observations, patient history, and epidemiological information. The expected result is Negative.  Fact Sheet for Patients: SugarRoll.be  Fact Sheet for Healthcare Providers: https://www.woods-mathews.com/  This test is not yet approved or cleared by the [place] FDA and  has been authorized for detection and/or diagnosis of SARS-CoV-2 by FDA under an Emergency Use Authorization (EUA). This EUA will remain  in effect (meaning this test can be used) for the duration of the COVID-19 declaration under Se ction 564(b)(1) of the Act, 21 U.S.C. section 360bbb-3(b)(1), unless the authorization is terminated or revoked sooner.  Performed at  [facility], [place] [address] [place], [place] [address]   Resp Panel by RT-PCR (Flu A&B, Covid) Nasopharyngeal Swab     Status: None   Collection Time: 09/04/20 10:25 AM   Specimen: Nasopharyngeal Swab; Nasopharyngeal(NP) swabs in vial transport medium  Result Value Ref Range Status   SARS Coronavirus 2 by RT PCR NEGATIVE NEGATIVE Final    Comment: (NOTE) SARS-CoV-2 target nucleic acids are NOT DETECTED.  The SARS-CoV-2 RNA is generally detectable in upper respiratory specimens during the acute phase of infection. The lowest concentration of SARS-CoV-2 viral copies this assay can detect is 138 copies/mL. A negative result does not preclude SARS-Cov-2 infection and should not be used as the Griffin basis for treatment or other patient management decisions. A negative result may occur with  improper specimen collection/handling, submission of specimen other than nasopharyngeal swab, presence of viral mutation(s) within the areas targeted by this assay, and inadequate number of viral copies(<138 copies/mL). A negative result must be combined with clinical observations, patient history, and epidemiological information. The expected result is Negative.  Fact Sheet for Patients:  EntrepreneurPulse.com.au  Fact Sheet for Healthcare Providers:  IncredibleEmployment.be  This test is no t yet approved or cleared by the [place] FDA and  has been authorized for detection and/or diagnosis of SARS-CoV-2 by FDA under an Emergency Use Authorization (EUA). This EUA will remain  in effect (meaning this test can be used) for the duration of the COVID-19 declaration under Section 564(b)(1) of the Act, 21 U.S.C.section 360bbb-3(b)(1), unless the authorization is terminated  or revoked sooner.       Influenza A by PCR NEGATIVE NEGATIVE Final   Influenza B by PCR NEGATIVE NEGATIVE Final    Comment: (NOTE) The Xpert Xpress SARS-CoV-2/FLU/RSV plus assay  is intended as an aid in the diagnosis of influenza from Nasopharyngeal swab specimens and should not be used as a Griffin basis for treatment. Nasal washings and aspirates are unacceptable for Xpert Xpress SARS-CoV-2/FLU/RSV testing.  Fact Sheet for Patients: EntrepreneurPulse.com.au  Fact Sheet for Healthcare Providers: IncredibleEmployment.be  This test is not yet approved or cleared by the [place] FDA and has been authorized for detection and/or diagnosis of SARS-CoV-2 by FDA under an Emergency Use Authorization (EUA). This EUA will remain in effect (meaning this test can be used) for the duration of the COVID-19 declaration under Section 564(b)(1) of the Act, 21 U.S.C. section 360bbb-3(b)(1), unless the authorization is terminated or revoked.  Performed at [facility], [place] [address] [place], [place] [address]   Culture, Urine     Status: Abnormal   Collection Time: 09/04/20  8:20 PM   Specimen: Urine, Random  Result Value Ref Range Status   Specimen Description URINE, RANDOM  Final   Special Requests  Final    NONE Performed at [facility], [place] [address], [place], [place] [address]    Culture 40,000 COLONIES/mL YEAST (A)  Final   Report Status 09/06/2020 FINAL  Final      Studies: No results found.   Scheduled Meds: . aspirin  81 mg Oral Daily  . clopidogrel  75 mg Oral Daily  . enoxaparin (LOVENOX) injection  40 mg Subcutaneous Q24H  . feeding supplement  237 mL Oral TID BM  . insulin aspart  0-9 Units Subcutaneous TID WC  . lenalidomide  10 mg Oral Daily  . LORazepam  0.5 mg Intravenous Once  . losartan  100 mg Oral Daily  . multivitamin with minerals  1 tablet Oral Daily  . polyethylene glycol  17 g Oral Daily  . rosuvastatin  10 mg Oral q1800  . senna-docusate  1 tablet Oral BID  . vitamin B-12  1,000 mcg Oral Daily   Continuous Infusions: . potassium chloride 10 mEq (09/08/20 1234)     Principal Problem:   Acute encephalopathy Active Problems:   Myelodysplastic syndrome (HCC)   Diabetes mellitus ([place])   Pancytopenia ([place])   Cerebral thrombosis with cerebral infarction   Ischemic stroke ([place])   Pressure injury of skin     Caelie Remsburg Tublu Liana Camerer, [facility]  If 7PM-7AM, please contact night-coverage [email or web address] Password [email or web address] 09/08/2020, 1:36 PM    LOS: 11 days

## 2020-09-08 NOTE — Progress Notes (Signed)
Occupational Therapy Treatment Patient Details Name: Kara Griffin MRN: [id number] DOB: 11-Jun-1936 Today's Date: 09/08/2020    History of present illness 85 y.o. female with history of myelodysplastic syndrome being followed by Dr. Florene Glen at [facility] on Revlimid, pancytopenia, diabetes mellitus, hypertension and hyperlipidemia was brought to the ED by patient's son after patient was found to be increasingly confused last 2 days. On August 19, 2020, patient was taken to [facility] after patient had a fall at home. CT and xray were negative during that ED visit and pt was d/c'd home. Pt underwent MRI 12/23 which revealed small R bagal ganglia lacunar infarct. MRI 09/02/20 revealed newly seen 5-7 punctate foci of acute infarctions scattered  within the inferior cerebellum on both sides. No large confluent  infarction. Being a different vascular territory, this suggests  embolic disease from the heart or ascending aorta.   OT comments  Pt seen in conjunction with PT to maximize pts activity tolerance as pt presenting with decreased activity tolerance unable to tolerate x2 sessions. Pt received supine in bed, lethargic and hesitant to mobilize with therapies. Overall, pt requires MAX A +2 to transition from supine>sitting and up to MOD A at times to sit statically EOB. Pt sit<>stand x4 from EOB with MAX A+2 with HHA and with RW. Pt completed oral care from EOB needing initial tactile and hand over hand cues to initiate task but progressed to supervision needing cues to sequence next steps. Pt with difficulty terminating a task and was mostly nonverbal during session.  Pt did attempt to verbalize, however speech was mostly nonsensical. Pt would continue to benefit from skilled occupational therapy while admitted and after d/c to address the below listed limitations in order to improve overall functional mobility and facilitate independence with BADL participation. DC plan remains appropriate, will follow  acutely per POC.    Follow Up Recommendations  SNF    Equipment Recommendations  Wheelchair cushion (measurements OT);Wheelchair (measurements OT);Hospital bed;3 in 1 bedside commode;Other (comment) (if dc home)    Recommendations for Other Services      Precautions / Restrictions Precautions Precautions: Fall Restrictions Weight Bearing Restrictions: No       Mobility Bed Mobility Overal bed mobility: Needs Assistance Bed Mobility: Rolling;Sidelying to Sit;Sit to Sidelying Rolling: Min assist;+2 for physical assistance Sidelying to sit: Max assist;+2 for physical assistance     Sit to sidelying: Total assist;+2 for physical assistance General bed mobility comments: Pt supine in bed required max to total assistance for bed mobility. Brief moments of ability to hold sitting balance but required min-mod assistance overal for balance.  Pt performed seated ADLs with support for balance.  Transfers Overall transfer level: Needs assistance Equipment used: Rolling walker (2 wheeled);2 person hand held assist Transfers: Sit to/from Stand Sit to Stand: Max assist;+2 physical assistance         General transfer comment: PT must initiate all mobility, significant cueing for posterior lean, hips flexed and unable to achieve full erect stance.    Balance Overall balance assessment: Needs assistance Sitting-balance support: Single extremity supported;Bilateral upper extremity supported Sitting balance-Leahy Scale: Zero Sitting balance - Comments: mod-maxA due to posterior lean Postural control: Posterior lean Standing balance support: Bilateral upper extremity supported Standing balance-Leahy Scale: Zero Standing balance comment: maxA with BUE support of PTA/OTA                           ADL either performed or assessed with clinical  judgement   ADL Overall ADL's : Needs assistance/impaired     Grooming: Oral care;Sitting;Supervision/safety;Set up;Cueing for  sequencing;Cueing for safety Grooming Details (indicate cue type and reason): pt able to complete oral care from EOB with supervision- set- up assist and initial tactile cues to initaite task, however once pt initiated pt with difficulty terminating task                   Toilet Transfer Details (indicate cue type and reason): pt unable to initiate steps however MAX A +2 for sit<>stand x4         Functional mobility during ADLs: Maximal assistance;+2 for physical assistance (sit<>stand only) General ADL Comments: pt continues to present with impaired balance, decreased activity tolerance and cognitive deficits impacting pts ability to complete BADLs independently     Vision       Perception     Praxis      Cognition Arousal/Alertness: Lethargic Behavior During Therapy: Flat affect Overall Cognitive Status: Impaired/Different from baseline Area of Impairment: Following commands;Safety/judgement;Problem solving                       Following Commands: Follows one step commands with increased time;Follows one step commands inconsistently Safety/Judgement: Decreased awareness of safety;Decreased awareness of deficits   Problem Solving: Slow processing;Decreased initiation;Difficulty sequencing;Requires verbal cues;Requires tactile cues General Comments: Pt with poor safety awareness. Repeated verbal and tactile cues for proper hand placement with transfers, with very poor carryover. Required multi-modal single steps cues throughout to sequence and initiate tasks and to maintain safety.        Exercises     Shoulder Instructions       General Comments pts son present during session    Pertinent Vitals/ Pain       Pain Assessment: Faces Faces Pain Scale: Hurts little more Pain Location: patient unable to indicate but seemed to have generalized discomfort-son reports back spasms Pain Descriptors / Indicators: Grimacing Pain Intervention(s): Limited activity  within patient's tolerance;Monitored during session;Repositioned  Home Living                                          Prior Functioning/Environment              Frequency  Min 2X/week        Progress Toward Goals  OT Goals(current goals can now be found in the care plan section)  Progress towards OT goals: Progressing toward goals  Acute Rehab OT Goals Patient Stated Goal: to improve cognition and mobility OT Goal Formulation: With patient Time For Goal Achievement: 09/11/20 Potential to Achieve Goals: Good  Plan Discharge plan remains appropriate;Frequency remains appropriate    Co-evaluation      Reason for Co-Treatment: Complexity of the patient's impairments (multi-system involvement);For patient/therapist safety;To address functional/ADL transfers;Necessary to address cognition/behavior during functional activity PT goals addressed during session: Mobility/safety with mobility OT goals addressed during session: ADL's and self-care      AM-PAC OT "6 Clicks" Daily Activity     Outcome Measure   Help from another person eating meals?: A Lot Help from another person taking care of personal grooming?: A Lot Help from another person toileting, which includes using toliet, bedpan, or urinal?: Total Help from another person bathing (including washing, rinsing, drying)?: A Lot Help from another person to put on and taking off regular upper  body clothing?: A Lot Help from another person to put on and taking off regular lower body clothing?: Total 6 Click Score: 10    End of Session Equipment Utilized During Treatment: Gait belt;Rolling walker  OT Visit Diagnosis: Unsteadiness on feet (R26.81);Other abnormalities of gait and mobility (R26.89);Muscle weakness (generalized) (M62.81);History of falling (Z91.81);Pain   Activity Tolerance Patient tolerated treatment well   Patient Left in bed;with call bell/phone within reach;with bed alarm set;with  nursing/sitter in room;Other (comment) (NT applying new purewick)   Nurse Communication          Time: 7972-8206 OT Time Calculation (min): 23 min  Charges: OT General Charges $OT Visit: 1 Visit OT Treatments $Self Care/Home Management : 8-22 mins Lenor Derrick., COTA/L Acute Rehabilitation Services [phone]    Kara Griffin 09/08/2020, 4:15 PM

## 2020-09-09 DIAGNOSIS — G9340 Encephalopathy, unspecified: Secondary | ICD-10-CM | POA: Diagnosis not present

## 2020-09-09 LAB — GLUCOSE, CAPILLARY
Glucose-Capillary: 165 mg/dL — ABNORMAL HIGH (ref 70–99)
Glucose-Capillary: 189 mg/dL — ABNORMAL HIGH (ref 70–99)
Glucose-Capillary: 134 mg/dL — ABNORMAL HIGH (ref 70–99)
Glucose-Capillary: 277 mg/dL — ABNORMAL HIGH (ref 70–99)

## 2020-09-09 LAB — BASIC METABOLIC PANEL
Anion gap: 13 (ref 5–15)
BUN: 24 mg/dL — ABNORMAL HIGH (ref 8–23)
CO2: 26 mmol/L (ref 22–32)
Calcium: 9.4 mg/dL (ref 8.9–10.3)
Chloride: 97 mmol/L — ABNORMAL LOW (ref 98–111)
Creatinine, Ser: 0.71 mg/dL (ref 0.44–1.00)
GFR, Estimated: >60 mL/min (ref >60)
Glucose, Bld: 175 mg/dL — ABNORMAL HIGH (ref 70–99)
Potassium: 3.3 mmol/L — ABNORMAL LOW (ref 3.5–5.1)
Sodium: 136 mmol/L (ref 135–145)

## 2020-09-09 LAB — MAGNESIUM: Magnesium: 1.8 mg/dL (ref 1.7–2.4)

## 2020-09-09 MED ORDER — POTASSIUM CHLORIDE 10 MEQ/100ML IV SOLN
10.0000 meq | INTRAVENOUS | Status: AC
  Administered 2020-09-09 (×4): 10 meq via INTRAVENOUS
  Filled 2020-09-09 (×4): qty 100

## 2020-09-09 MED ORDER — POTASSIUM CHLORIDE 20 MEQ PO PACK: 40.0000 meq | PACK | Freq: Once | ORAL | Status: DC

## 2020-09-09 NOTE — Progress Notes (Signed)
Patient refused dinner last night, still refusing medications. Patient will not take anything PO. Family and nurse continuing to encourage PO intake.

## 2020-09-09 NOTE — TOC Progression Note (Signed)
Transition of Care [facility]) - Progression Note    Patient Details  Name: [name] MRN: [id number] Date of Birth: 05/09/1936  Transition of Care [facility]) CM/SW Contact  Baldemar Lenis, [place] Phone Number: 09/09/2020, 12:50 PM  Clinical Narrative:   CSW received insurance approval for patient to admit to SNF, Auth #[id number], Reference # [id number]. Updates due 1/7. Per MD, patient not stable to discharge today. CSW updated [facility]. CSW to follow.    Expected Discharge Plan: Skilled Nursing Facility Barriers to Discharge: SNF Pending bed offer,Insurance Authorization,Continued Medical Work up  Expected Discharge Plan and Services Expected Discharge Plan: Skilled Nursing Facility     Post Acute Care Choice: Skilled Nursing Facility Living arrangements for the past 2 months: Single Family Home Expected Discharge Date: 09/05/20                                     Social Determinants of Health (SDOH) Interventions    Readmission Risk Interventions No flowsheet data found.

## 2020-09-09 NOTE — Progress Notes (Signed)
Patient has been refusing all meals and medications, will not eat or drink anything. Family and nurse continue to encourage PO intake.

## 2020-09-09 NOTE — Progress Notes (Signed)
PROGRESS NOTE    Kara Griffin  [id number] DOB: February 05, 1936 DOA: 08/09/2020 PCP: Einar Pheasant, MD     Brief Narrative:  Kara Griffin is an 85 year old female with MDS, DM2, HTN who was admitted 08/05/2020 with acute basal ganglia lacunar infarct secondary to small vessel disease. She completed a stroke work-up. MRA brain no large vessel obstruction. Moderate L A1 stenosis. Carotid Doppler ultrasound unremarkable. 2D echo EF 75%, no source of embolus. EEG suggestive of mild diffuse encephalopathy, nonspecific etiology. No seizures or epileptiform discharges were seen throughout the recording. LDL 35. Hemoglobin A1c 6.0. Seen by neurology/stroke team with recommendation for aspirin 81 mg daily and Plavix 75 mg daily for 3 weeks followed by aspirin alone. PT OT recommended SNF.  MRI brain was done on 09/02/2020 which showednew 5-7 punctate foci of acute infarctions scattered within the inferior cerebellum on both sides, suggestive of embolic disease from the heart or ascending aorta.Cardiology was contacted for arrangement of 30-day cardiac event monitoring outpatient.  She had a CT a head and neck completed which was unremarkable. Bilateral lower extremity venous Doppler ultrasound was negative for DVT. Neurology signed off. She will follow-up outpatient at [facility] in about 4 weeks.  Hospital course complicated by delirium with waxing and waning altered mental status. Her sons strongly believes that she has a UTI. Urine culture wasnegative. She was given additional 3 days of antibiotics. DC'd antibiotics on 09/05/2020.CT head was done which was negative for any acute intracranial findings. Patient was worked up for secondary sources of delirium which was negative. Patient was seen by Neurology on 09/08/19 who note no further work up is indicated and patient would benefit from strict delirium measures. Also of concern was patient's inability to take Revlimid orally  for her MDS. Hematology/Oncology saw patient and recommended holding Revlimid until patient could take it orally.   New events last 24 hours / Subjective: Patient remains altered. She is alert and oriented x 1 and does not follow commands or answers questions. RN reports poor PO intake and patient refusing meds and food last 2 days.   Assessment & Plan:   Principal Problem:   Acute encephalopathy Active Problems:   Myelodysplastic syndrome ([place])   Diabetes mellitus ([place])   Pancytopenia ([place])   Cerebral thrombosis with cerebral infarction   Ischemic stroke ([place])   Pressure injury of skin   Acute metabolic encephalopathy, delirium -At baseline, she had been independent although declining since Thanksgiving 2021. Patient seen by neurology, metabolic work up unremarkable. No reversible causes found. No further work-up for delirium recommended -Delirium precaution  Acute CVA -Follow-up outpatient 30-day event monitoring to rule out A. fib.  Cardiology aware -Continue aspirin, Plavix for 3 weeks, followed by aspirin alone -Crestor  -Follow-up with neurology in 4 weeks -Appreciate PT OT, recommending SNF placement  MDS Pancytopenia  -Appreciate oncology, hold Revlimid until patient able to take it orally -Follow-up with her primary oncologist at [facility]  Hypokalemia -Replace, trend  Essential hypertension -Continue losartan  Diabetes mellitus type 2, well controlled -CBGs   Left thyroid nodule -Follow-up as outpatient   In agreement with assessment of the pressure ulcer as below:  Pressure Injury 09/03/20 Buttocks Right Stage 2 -  Partial thickness loss of dermis presenting as a shallow open injury with a red, pink wound bed without slough. quarter size  open area (Active)  09/03/20 2023  Location: Buttocks  Location Orientation: Right  Staging: Stage 2 -  Partial thickness loss of dermis presenting  as a shallow open injury with a red, pink wound bed without  slough.  Wound Description (Comments): quarter size  open area  Present on Admission: No     Nutrition Problem: Inadequate oral intake Etiology: poor appetite   DVT prophylaxis:  enoxaparin (LOVENOX) injection 40 mg Start: 09/02/20 1645 SCDs Start: 08/18/2020 2355  Code Status: Full Family Communication: None at bedside, discussed with son over the phone Disposition Plan:  Status is: Inpatient  Remains inpatient appropriate because:Unsafe d/c plan and IV treatments appropriate due to intensity of illness or inability to take PO   Dispo:  Patient From: Home  Planned Disposition: [place]  Expected discharge date: 09/11/20  Medically stable for discharge: No. Pt remains with poor PO intake     Antimicrobials:  Anti-infectives (From admission, onward)   Start     Dose/Rate Route Frequency Ordered Stop   09/05/20 0800  cefdinir (OMNICEF) capsule 300 mg  Status:  Discontinued        300 mg Oral Every 12 hours 09/04/20 1730 09/05/20 1105   09/04/20 0000  cefdinir (OMNICEF) 300 MG capsule  Status:  Discontinued        300 mg Oral 2 times daily 09/04/20 1723 09/05/20    09/02/20 1530  cefTRIAXone (ROCEPHIN) 1 g in sodium chloride 0.9 % 100 mL IVPB  Status:  Discontinued        1 g 200 mL/hr over 30 Minutes Intravenous Every 24 hours 09/02/20 1447 09/04/20 1730   08/30/20 2200  cefTRIAXone (ROCEPHIN) 1 g in sodium chloride 0.9 % 100 mL IVPB        1 g 200 mL/hr over 30 Minutes Intravenous Daily at bedtime 08/30/20 1329 08/31/20 0923   08/29/20 2200  cefTRIAXone (ROCEPHIN) 1 g in sodium chloride 0.9 % 100 mL IVPB  Status:  Discontinued        1 g 200 mL/hr over 30 Minutes Intravenous Daily at bedtime 08/29/20 1031 08/30/20 1329   08/29/20 0600  vancomycin (VANCOCIN) IVPB 1000 mg/200 mL premix  Status:  Discontinued        1,000 mg 200 mL/hr over 60 Minutes Intravenous Every 24 hours 08/28/20 0634 08/28/20 1410   08/28/20 0800  ceFEPIme (MAXIPIME) 2 g in sodium  chloride 0.9 % 100 mL IVPB  Status:  Discontinued        2 g 200 mL/hr over 30 Minutes Intravenous Every 12 hours 08/28/20 0634 08/29/20 1031   08/28/20 0645  vancomycin (VANCOREADY) IVPB 1500 mg/300 mL        1,500 mg 150 mL/hr over 120 Minutes Intravenous  Once 08/28/20 0634 08/28/20 1146        Objective: Vitals:   09/08/20 0826 09/08/20 2017 09/09/20 0700 09/09/20 1100  BP: (!) 166/63 (!) 170/101 (!) 169/65 (!) 178/73  Pulse: 69 80 80 93  Resp: 16 18 19 16   Temp: 97.7 F (36.5 C) 97.8 F (36.6 C) 97.8 F (36.6 C) 98.4 F (36.9 C)  TempSrc: Oral Oral Oral Oral  SpO2: 97% 98% 99% 99%    Intake/Output Summary (Last 24 hours) at 09/09/2020 1410 Last data filed at 09/09/2020 0531 Gross per 24 hour  Intake 478 ml  Output 300 ml  Net 178 ml   There were no vitals filed for this visit.  Examination:  General exam: Appears calm and comfortable  Respiratory system: Clear to auscultation. Respiratory effort normal. No respiratory distress. No conversational dyspnea.  Cardiovascular system: S1 & S2 heard, RRR. No murmurs.  No pedal edema. Gastrointestinal system: Abdomen is nondistended, soft and nontender. Normal bowel sounds heard. Central nervous system: Alert, does not follow command  Extremities: Symmetric in appearance  Skin: No rashes, lesions or ulcers on exposed skin   Data Reviewed: I have personally reviewed following labs and imaging studies  CBC: Recent Labs  Lab 09/03/20 1017 09/04/20 0438 09/04/20 2145 09/06/20 1008 09/08/20 0557  WBC 1.7* 1.8* 1.7* 1.4* 1.6*  NEUTROABS 1.1* 1.2*  --  0.9* 1.0*  HGB 9.7* 9.6* 9.2* 10.1* 10.0*  HCT 27.2* 27.1* 26.0* 29.2* 29.7*  MCV 86.1 85.5 86.4 86.6 86.6  PLT 113* 113* 116* 93* 82*   Basic Metabolic Panel: Recent Labs  Lab 09/03/20 1017 09/04/20 0438 09/04/20 2145 09/06/20 1008 09/08/20 0557 09/09/20 0316  NA 132* 135 133* 134* 133* 136  K 3.6 3.7 3.7 3.3* 3.1* 3.3*  CL 98 99 97* 99 97* 97*  CO2 26 27 26 24  22 26   GLUCOSE 212* 162* 168* 167* 155* 175*  BUN 9 9 13 15 19  24*  CREATININE 0.57 0.66 0.59 0.64 0.67 0.71  CALCIUM 8.4* 8.7* 8.8* 8.6* 9.1 9.4  MG 1.8  --   --   --   --  1.8   GFR: CrCl cannot be calculated (Unknown ideal weight.). Liver Function Tests: Recent Labs  Lab 09/04/20 0438 09/04/20 2145 09/06/20 1008  AST 9* 11* 12*  ALT 13 12 14   ALKPHOS 65 65 62  BILITOT 0.5 0.4 0.6  PROT 5.4* 5.4* 5.6*  ALBUMIN 2.8* 2.9* 3.0*   No results for input(s): LIPASE, AMYLASE in the last 168 hours. No results for input(s): AMMONIA in the last 168 hours. Coagulation Profile: No results for input(s): INR, PROTIME in the last 168 hours. Cardiac Enzymes: No results for input(s): CKTOTAL, CKMB, CKMBINDEX, TROPONINI in the last 168 hours. BNP (last 3 results) No results for input(s): PROBNP in the last 8760 hours. HbA1C: No results for input(s): HGBA1C in the last 72 hours. CBG: Recent Labs  Lab 09/08/20 1220 09/08/20 1739 09/08/20 2128 09/09/20 0607 09/09/20 1133  GLUCAP 175* 171* 158* 165* 189*   Lipid Profile: No results for input(s): CHOL, HDL, LDLCALC, TRIG, CHOLHDL, LDLDIRECT in the last 72 hours. Thyroid Function Tests: No results for input(s): TSH, T4TOTAL, FREET4, T3FREE, THYROIDAB in the last 72 hours. Anemia Panel: No results for input(s): VITAMINB12, FOLATE, FERRITIN, TIBC, IRON, RETICCTPCT in the last 72 hours. Sepsis Labs: Recent Labs  Lab 09/03/20 1017  PROCALCITON 0.16  LATICACIDVEN 1.0    Recent Results (from the past 240 hour(s))  Resp Panel by RT-PCR (Flu A&B, Covid) Nasopharyngeal Swab     Status: None   Collection Time: 08/31/20 12:10 PM   Specimen: Nasopharyngeal Swab; Nasopharyngeal(NP) swabs in vial transport medium  Result Value Ref Range Status   SARS Coronavirus 2 by RT PCR NEGATIVE NEGATIVE Final    Comment: (NOTE) SARS-CoV-2 target nucleic acids are NOT DETECTED.  The SARS-CoV-2 RNA is generally detectable in upper  respiratory specimens during the acute phase of infection. The lowest concentration of SARS-CoV-2 viral copies this assay can detect is 138 copies/mL. A negative result does not preclude SARS-Cov-2 infection and should not be used as the sole basis for treatment or other patient management decisions. A negative result may occur with  improper specimen collection/handling, submission of specimen other than nasopharyngeal swab, presence of viral mutation(s) within the areas targeted by this assay, and inadequate number of viral copies(<138 copies/mL). A negative result must be  combined with clinical observations, patient history, and epidemiological information. The expected result is Negative.  Fact Sheet for Patients:  EntrepreneurPulse.com.au  Fact Sheet for Healthcare Providers:  IncredibleEmployment.be  This test is no t yet approved or cleared by the [place] FDA and  has been authorized for detection and/or diagnosis of SARS-CoV-2 by FDA under an Emergency Use Authorization (EUA). This EUA will remain  in effect (meaning this test can be used) for the duration of the COVID-19 declaration under Section 564(b)(1) of the Act, 21 U.S.C.section 360bbb-3(b)(1), unless the authorization is terminated  or revoked sooner.       Influenza A by PCR NEGATIVE NEGATIVE Final   Influenza B by PCR NEGATIVE NEGATIVE Final    Comment: (NOTE) The Xpert Xpress SARS-CoV-2/FLU/RSV plus assay is intended as an aid in the diagnosis of influenza from Nasopharyngeal swab specimens and should not be used as a sole basis for treatment. Nasal washings and aspirates are unacceptable for Xpert Xpress SARS-CoV-2/FLU/RSV testing.  Fact Sheet for Patients: EntrepreneurPulse.com.au  Fact Sheet for Healthcare Providers: IncredibleEmployment.be  This test is not yet approved or cleared by the [place] FDA and has been  authorized for detection and/or diagnosis of SARS-CoV-2 by FDA under an Emergency Use Authorization (EUA). This EUA will remain in effect (meaning this test can be used) for the duration of the COVID-19 declaration under Section 564(b)(1) of the Act, 21 U.S.C. section 360bbb-3(b)(1), unless the authorization is terminated or revoked.  Performed at [facility], [place] [address] [place], [place] [address]   SARS CORONAVIRUS 2 (TAT 6-24 HRS) Nasopharyngeal Nasopharyngeal Swab     Status: None   Collection Time: 09/01/20  4:38 PM   Specimen: Nasopharyngeal Swab  Result Value Ref Range Status   SARS Coronavirus 2 NEGATIVE NEGATIVE Final    Comment: (NOTE) SARS-CoV-2 target nucleic acids are NOT DETECTED.  The SARS-CoV-2 RNA is generally detectable in upper and lower respiratory specimens during the acute phase of infection. Negative results do not preclude SARS-CoV-2 infection, do not rule out co-infections with other pathogens, and should not be used as the sole basis for treatment or other patient management decisions. Negative results must be combined with clinical observations, patient history, and epidemiological information. The expected result is Negative.  Fact Sheet for Patients: SugarRoll.be  Fact Sheet for Healthcare Providers: https://www.woods-mathews.com/  This test is not yet approved or cleared by the [place] FDA and  has been authorized for detection and/or diagnosis of SARS-CoV-2 by FDA under an Emergency Use Authorization (EUA). This EUA will remain  in effect (meaning this test can be used) for the duration of the COVID-19 declaration under Se ction 564(b)(1) of the Act, 21 U.S.C. section 360bbb-3(b)(1), unless the authorization is terminated or revoked sooner.  Performed at [facility], [place] [address], [place], [place] [address]   Resp Panel by RT-PCR (Flu A&B, Covid) Nasopharyngeal Swab     Status:  None   Collection Time: 09/04/20 10:25 AM   Specimen: Nasopharyngeal Swab; Nasopharyngeal(NP) swabs in vial transport medium  Result Value Ref Range Status   SARS Coronavirus 2 by RT PCR NEGATIVE NEGATIVE Final    Comment: (NOTE) SARS-CoV-2 target nucleic acids are NOT DETECTED.  The SARS-CoV-2 RNA is generally detectable in upper respiratory specimens during the acute phase of infection. The lowest concentration of SARS-CoV-2 viral copies this assay can detect is 138 copies/mL. A negative result does not preclude SARS-Cov-2 infection and should not be used as the  sole basis for treatment or other patient management decisions. A negative result may occur with  improper specimen collection/handling, submission of specimen other than nasopharyngeal swab, presence of viral mutation(s) within the areas targeted by this assay, and inadequate number of viral copies(<138 copies/mL). A negative result must be combined with clinical observations, patient history, and epidemiological information. The expected result is Negative.  Fact Sheet for Patients:  EntrepreneurPulse.com.au  Fact Sheet for Healthcare Providers:  IncredibleEmployment.be  This test is no t yet approved or cleared by the [place] FDA and  has been authorized for detection and/or diagnosis of SARS-CoV-2 by FDA under an Emergency Use Authorization (EUA). This EUA will remain  in effect (meaning this test can be used) for the duration of the COVID-19 declaration under Section 564(b)(1) of the Act, 21 U.S.C.section 360bbb-3(b)(1), unless the authorization is terminated  or revoked sooner.       Influenza A by PCR NEGATIVE NEGATIVE Final   Influenza B by PCR NEGATIVE NEGATIVE Final    Comment: (NOTE) The Xpert Xpress SARS-CoV-2/FLU/RSV plus assay is intended as an aid in the diagnosis of influenza from Nasopharyngeal swab specimens and should not be used as a sole basis for  treatment. Nasal washings and aspirates are unacceptable for Xpert Xpress SARS-CoV-2/FLU/RSV testing.  Fact Sheet for Patients: EntrepreneurPulse.com.au  Fact Sheet for Healthcare Providers: IncredibleEmployment.be  This test is not yet approved or cleared by the [place] FDA and has been authorized for detection and/or diagnosis of SARS-CoV-2 by FDA under an Emergency Use Authorization (EUA). This EUA will remain in effect (meaning this test can be used) for the duration of the COVID-19 declaration under Section 564(b)(1) of the Act, 21 U.S.C. section 360bbb-3(b)(1), unless the authorization is terminated or revoked.  Performed at [facility], [place] [address] [place], [place] [address]   Culture, Urine     Status: Abnormal   Collection Time: 09/04/20  8:20 PM   Specimen: Urine, Random  Result Value Ref Range Status   Specimen Description URINE, RANDOM  Final   Special Requests   Final    NONE Performed at [facility], [place] [address] [place], [place] [address]    Culture 40,000 COLONIES/mL YEAST (A)  Final   Report Status 09/06/2020 FINAL  Final  SARS CORONAVIRUS 2 (TAT 6-24 HRS) Nasopharyngeal Nasopharyngeal Swab     Status: None   Collection Time: 09/08/20  2:11 PM   Specimen: Nasopharyngeal Swab  Result Value Ref Range Status   SARS Coronavirus 2 NEGATIVE NEGATIVE Final    Comment: (NOTE) SARS-CoV-2 target nucleic acids are NOT DETECTED.  The SARS-CoV-2 RNA is generally detectable in upper and lower respiratory specimens during the acute phase of infection. Negative results do not preclude SARS-CoV-2 infection, do not rule out co-infections with other pathogens, and should not be used as the sole basis for treatment or other patient management decisions. Negative results must be combined with clinical observations, patient history, and epidemiological information. The expected result is Negative.  Fact Sheet  for Patients: SugarRoll.be  Fact Sheet for Healthcare Providers: https://www.woods-mathews.com/  This test is not yet approved or cleared by the [place] FDA and  has been authorized for detection and/or diagnosis of SARS-CoV-2 by FDA under an Emergency Use Authorization (EUA). This EUA will remain  in effect (meaning this test can be used) for the duration of the COVID-19 declaration under Se ction 564(b)(1) of the Act, 21 U.S.C. section 360bbb-3(b)(1), unless the authorization is terminated  or revoked sooner.  Performed at [facility], [address], [place], [place] [address]       Radiology Studies: No results found.    Scheduled Meds: . aspirin  81 mg Oral Daily  . clopidogrel  75 mg Oral Daily  . enoxaparin (LOVENOX) injection  40 mg Subcutaneous Q24H  . insulin aspart  0-9 Units Subcutaneous TID WC  . lenalidomide  10 mg Oral Daily  . LORazepam  0.5 mg Intravenous Once  . losartan  100 mg Oral Daily  . multivitamin with minerals  1 tablet Oral Daily  . polyethylene glycol  17 g Oral Daily  . potassium chloride  40 mEq Oral Once  . rosuvastatin  10 mg Oral q1800  . senna-docusate  1 tablet Oral BID  . vitamin B-12  1,000 mcg Oral Daily   Continuous Infusions:   LOS: 12 days      Time spent: 35 minutes   Noralee Stain, DO [facility] 09/09/2020, 2:10 PM   Available via Epic secure chat 7am-7pm After these hours, please refer to coverage provider listed on [email or web address]

## 2020-09-10 ENCOUNTER — Telehealth: Payer: Self-pay | Admitting: Internal Medicine

## 2020-09-10 DIAGNOSIS — G9340 Encephalopathy, unspecified: Secondary | ICD-10-CM | POA: Diagnosis not present

## 2020-09-10 LAB — GLUCOSE, CAPILLARY
Glucose-Capillary: 223 mg/dL — ABNORMAL HIGH (ref 70–99)
Glucose-Capillary: 188 mg/dL — ABNORMAL HIGH (ref 70–99)
Glucose-Capillary: 205 mg/dL — ABNORMAL HIGH (ref 70–99)
Glucose-Capillary: 188 mg/dL — ABNORMAL HIGH (ref 70–99)

## 2020-09-10 LAB — BASIC METABOLIC PANEL
Anion gap: 14 (ref 5–15)
BUN: 27 mg/dL — ABNORMAL HIGH (ref 8–23)
CO2: 22 mmol/L (ref 22–32)
Calcium: 9.7 mg/dL (ref 8.9–10.3)
Chloride: 102 mmol/L (ref 98–111)
Creatinine, Ser: 0.77 mg/dL (ref 0.44–1.00)
GFR, Estimated: >60 mL/min (ref >60)
Glucose, Bld: 234 mg/dL — ABNORMAL HIGH (ref 70–99)
Potassium: 3.5 mmol/L (ref 3.5–5.1)
Sodium: 138 mmol/L (ref 135–145)

## 2020-09-10 LAB — MAGNESIUM: Magnesium: 1.8 mg/dL (ref 1.7–2.4)

## 2020-09-10 MED ORDER — SODIUM CHLORIDE 0.9 % IV SOLN: INTRAVENOUS | Status: DC

## 2020-09-10 MED ORDER — METOPROLOL TARTRATE 5 MG/5ML IV SOLN: 2.5000 mg | Freq: Four times a day (QID) | INTRAVENOUS | Status: DC

## 2020-09-10 MED ORDER — METOPROLOL TARTRATE 5 MG/5ML IV SOLN
5.0000 mg | Freq: Four times a day (QID) | INTRAVENOUS | Status: DC
  Administered 2020-09-10 – 2020-09-11 (×5): 5 mg via INTRAVENOUS
  Filled 2020-09-10 (×5): qty 5

## 2020-09-10 MED ORDER — HYDRALAZINE HCL 20 MG/ML IJ SOLN
5.0000 mg | INTRAMUSCULAR | Status: DC | PRN
  Administered 2020-09-10 – 2020-09-12 (×4): 5 mg via INTRAVENOUS
  Filled 2020-09-10 (×4): qty 1

## 2020-09-10 MED ORDER — INSULIN ASPART 100 UNIT/ML ~~LOC~~ SOLN
0.0000 [IU] | Freq: Three times a day (TID) | SUBCUTANEOUS | Status: DC
  Administered 2020-09-10: 3 [IU] via SUBCUTANEOUS
  Administered 2020-09-10: 5 [IU] via SUBCUTANEOUS

## 2020-09-10 MED ORDER — CLOTRIMAZOLE 1 % VA CREA
1.0000 | TOPICAL_CREAM | Freq: Every day | VAGINAL | Status: DC
  Administered 2020-09-10 – 2020-09-11 (×2): 1 via VAGINAL
  Filled 2020-09-10: qty 45

## 2020-09-10 NOTE — Progress Notes (Signed)
Physical Therapy Treatment Patient Details Name: Kara Griffin MRN: [id number] DOB: 09-25-1935 Today's Date: 09/10/2020    History of Present Illness 85 y.o. female with history of myelodysplastic syndrome being followed by Dr. Florene Glen at [facility] on [place], pancytopenia, diabetes mellitus, hypertension and hyperlipidemia was brought to the ED by patient's son after patient was found to be increasingly confused last 2 days. On August 19, 2020, patient was taken to [facility] after patient had a fall at home. CT and xray were negative during that ED visit and pt was d/c'd home. Pt underwent MRI 12/23 which revealed small R bagal ganglia lacunar infarct. MRI 09/02/20 revealed newly seen 5-7 punctate foci of acute infarctions scattered  within the inferior cerebellum on both sides. No large confluent  infarction. Being a different vascular territory, this suggests  embolic disease from the heart or ascending aorta.    PT Comments    Pt limited in progress by lethargy and poor ability to follow directions (follows ~15% of simple multi-modal cues). Pt nonverbal throughout session and minimally participating. Pt required TAx2 to transition to sit EOB, return to supine, and laterally scoot transfer to R on EOB along with maxAx2 to roll in bed. Pt did display improved sitting balance from maxA initially to bouts of ~5 seconds only min guard assist, especially when cued hand-over-hand to place hands/forearms on her thighs to decrease her posterior lean. Pt restless and impulsive reaching and grabbing at therapist and tech throughout. Pt rolled to L at end of session to relieve pressure on R. Notified RN of need for bunny boots to prevent heel pressure ulcers. Will continue to follow acutely. Current recommendations remain appropriate.  Follow Up Recommendations  SNF;Supervision/Assistance - 24 hour     Equipment Recommendations  Wheelchair (measurements PT);Wheelchair cushion (measurements  PT);Hospital bed    Recommendations for Other Services       Precautions / Restrictions Precautions Precautions: Fall Restrictions Weight Bearing Restrictions: No    Mobility  Bed Mobility Overal bed mobility: Needs Assistance Bed Mobility: Rolling;Sidelying to Sit;Sit to Supine Rolling: +2 for physical assistance;+2 for safety/equipment;Max assist Sidelying to sit: Total assist;+2 for physical assistance;+2 for safety/equipment;HOB elevated   Sit to supine: Total assist;+2 for physical assistance;+2 for safety/equipment   General bed mobility comments: Hand over hand cues for hand placement on rails to assist with rolling, maxAx2. TAx2 to transition to sit and back to supine, with pt unable to follow commands.  Transfers Overall transfer level: Needs assistance Equipment used: None Transfers: Lateral/Scoot Transfers          Lateral/Scoot Transfers: Total assist;+2 physical assistance;+2 safety/equipment General transfer comment: No initiation noted from pt despite max cues and extra time provided.  Ambulation/Gait             General Gait Details: deferred due to safety concerns.   Stairs             Wheelchair Mobility    Modified Rankin (Stroke Patients Only) Modified Rankin (Stroke Patients Only) Pre-Morbid Rankin Score: No significant disability Modified Rankin: Severe disability     Balance Overall balance assessment: Needs assistance Sitting-balance support: Bilateral upper extremity supported;Feet supported Sitting balance-Leahy Scale: Zero Sitting balance - Comments: Pt reaching restlessly and impulsively with 1-2 UEs at times but otherwise hand-over-hand cues for placement on bed or lap. Improved balance with decreased posterior lean when hands or elbows were on lap. Brief ~5 second moments of min guard but otherwise min-maxA to maintain balance EOB. Postural control:  Posterior lean     Standing balance comment: deferred due to safety  concerns.                            Cognition Arousal/Alertness: Lethargic Behavior During Therapy: Flat affect;Impulsive;Restless Overall Cognitive Status: Impaired/Different from baseline Area of Impairment: Following commands;Safety/judgement;Problem solving;Attention;Awareness                   Current Attention Level: Divided   Following Commands: Follows one step commands inconsistently;Follows one step commands with increased time Safety/Judgement: Decreased awareness of safety;Decreased awareness of deficits Awareness: Intellectual Problem Solving: Slow processing;Decreased initiation;Difficulty sequencing;Requires verbal cues;Requires tactile cues General Comments: Pt nonverbal throughout session, initially with eyes closed but opened with bed mob. Pt impulsive reaching out often and grasping at therapists. Able to follow ~15% of multi-modal simple cues. Decreased safety awareness as she had little reactional strategies as support for sitting was removed.      Exercises      General Comments General comments (skin integrity, edema, etc.): Pt rolled to L end of session to relieve pressure on R side      Pertinent Vitals/Pain Pain Assessment: Faces Faces Pain Scale: Hurts little more Pain Location: Generalized with mobility Pain Descriptors / Indicators: Grimacing;Moaning Pain Intervention(s): Limited activity within patient's tolerance;Monitored during session;Repositioned    Home Living                      Prior Function            PT Goals (current goals can now be found in the care plan section) Acute Rehab PT Goals Patient Stated Goal: to improve cognition and mobility PT Goal Formulation: Patient unable to participate in goal setting Time For Goal Achievement: 09/11/20 Potential to Achieve Goals: Fair Progress towards PT goals: Not progressing toward goals - comment (limited by lethargy and ability to follow cues)     Frequency    Min 3X/week      PT Plan Current plan remains appropriate    Co-evaluation              AM-PAC PT "6 Clicks" Mobility   Outcome Measure  Help needed turning from your back to your side while in a flat bed without using bedrails?: A Lot Help needed moving from lying on your back to sitting on the side of a flat bed without using bedrails?: Total Help needed moving to and from a bed to a chair (including a wheelchair)?: Total Help needed standing up from a chair using your arms (e.g., wheelchair or bedside chair)?: Total Help needed to walk in hospital room?: Total Help needed climbing 3-5 steps with a railing? : Total 6 Click Score: 7    End of Session   Activity Tolerance: Other (comment);Treatment limited secondary to medical complications (Comment) (limited by ability to follow directions) Patient left: in bed;with call bell/phone within reach;with bed alarm set Nurse Communication: Mobility status;Other (comment) (need for bunny boots to prevent pressure wounds on heels) PT Visit Diagnosis: Unsteadiness on feet (R26.81);Other abnormalities of gait and mobility (R26.89);Muscle weakness (generalized) (M62.81);Pain;Difficulty in walking, not elsewhere classified (R26.2);Other symptoms and signs involving the nervous system (R29.898) Pain - Right/Left:  (generalized) Pain - part of body:  (generalized)     Time: 1202-1224 PT Time Calculation (min) (ACUTE ONLY): 22 min  Charges:  $Neuromuscular Re-education: 8-22 mins  Raymond Gurney, PT, DPT Acute Rehabilitation Services  Pager: [phone] Office: [phone]    Jewel Baize 09/10/2020, 2:25 PM

## 2020-09-10 NOTE — Progress Notes (Addendum)
IR consulted by Dr. Alvino Chapel for possible image-guided percutaneous gastrostomy tube placement.  Case/images have been reviewed by Dr. Lowella Dandy who approves procedure. Patient currently taking Plavix (LD this AM)- per IR protocol requires 5 day hold prior to procedure. Dr. Alvino Chapel made aware- agrees to hold Plavix. Plan for image-guided percutaneous gastrostomy tube placement in IR tentatively for Wednesday 09/16/2020 pending IR scheduling and Plavix hold. Patient will be seen/consented later this week/early next week prior to procedure. IR to hold Lovenox as well. INR pending.  IR to follow.   Waylan Boga Joseff Luckman, PA-C 09/10/2020, 4:22 PM

## 2020-09-10 NOTE — Telephone Encounter (Signed)
This was placed prior to her being readmitted.  Per note, they are planning to discharge her to SNF.  Do not need home health at this time.  Thank you for the follow up.

## 2020-09-10 NOTE — Progress Notes (Signed)
  Speech Language Pathology Treatment: Dysphagia  Patient Details Name: Kara Griffin MRN: [id number] DOB: 1935/12/28 Today's Date: 09/10/2020 Time: 1110-1130 SLP Time Calculation (min) (ACUTE ONLY): 20 min  Assessment / Plan / Recommendation Clinical Impression  Pt seen for limited dysphagia treatment with education provided to son, Kara Griffin during trial.  Pt received oral care prior to SLP arriving for session, so this was not attempted.  Pt administered tsp amount of nectar-thickened liquids and puree consistency with max multimodal cues provided during session as pt's mentation was decreased and she was restless and lethargic despite max cues.  Small amounts provided with primarily cognitive-based dysphagia present as evidenced on prior MBS.  Pt manipulated bolus minimally to propel into pharynx for initiation of the swallow, but oral residue remained suggesting inefficient swallow with multiple swallows noted after initial swallow with both consistencies.  Material was removed via oral suctioning after presentation when pt unable to clear independently to reduce aspiration risk.  Son was educated re: risk of aspiration and nutrition/hydration concerns as pt is refusing or minimally accepting POs orally.  Son stated family is interested in providing non-oral nutrition/hydration until pt is able to participate with intake of POs more readily as mentation improves.  ST will continue to f/u while in acute setting for dysphagia tx.  HPI HPI: Kara Griffin is a 85 y.o. female with PMH significant for DM2, HTN, Myelodysplastic syndrome, HLD who is admitted with dizziness and falls that have been progressively worsening for the last month. She was brought in by family for worsening confusion and now in her bed all day x 2 days.Son reports the patient has had some symptoms of gastroenteritis with nausea, has not thrown up and some diarrhea. MRI brain demonstrated an acute right basal ganglia  lacunar infarct. Neurologist feels Stroke is an incidental finding, unrelated to waxing and waning mentation.      SLP Plan  Continue with current plan of care       Recommendations  Diet recommendations: Dysphagia 1 (puree);Nectar-thick liquid Liquids provided via: Teaspoon;Cup Medication Administration: Crushed with puree Supervision: Full supervision/cueing for compensatory strategies;Trained caregiver to feed patient Compensations: Slow rate;Small sips/bites;Multiple dry swallows after each bite/sip                Oral Care Recommendations: Oral care BID Follow up Recommendations: Other (comment) (TBD) SLP Visit Diagnosis: Dysphagia, unspecified (R13.10) Plan: Continue with current plan of care                      Tressie Stalker, M.S., CCC-SLP 09/10/2020, 3:39 PM

## 2020-09-10 NOTE — Progress Notes (Signed)
PROGRESS NOTE    Kara Griffin  NOB:[id number] DOB: 1936-07-10 DOA: 08/16/2020 PCP: Einar Pheasant, MD     Brief Narrative:  Kara Griffin is an 85 year old female with MDS, DM2, HTN who was admitted 08/10/2020 with acute basal ganglia lacunar infarct secondary to small vessel disease. She completed a stroke work-up. MRA brain no large vessel obstruction. Moderate L A1 stenosis. Carotid Doppler ultrasound unremarkable. 2D echo EF 75%, no source of embolus. EEG suggestive of mild diffuse encephalopathy, nonspecific etiology. No seizures or epileptiform discharges were seen throughout the recording. LDL 35. Hemoglobin A1c 6.0. Seen by neurology/stroke team with recommendation for aspirin 81 mg daily and Plavix 75 mg daily for 3 weeks followed by aspirin alone. PT OT recommended SNF.  MRI brain was done on 09/02/2020 which showednew 5-7 punctate foci of acute infarctions scattered within the inferior cerebellum on both sides, suggestive of embolic disease from the heart or ascending aorta.Cardiology was contacted for arrangement of 30-day cardiac event monitoring outpatient.  She had a CT a head and neck completed which was unremarkable. Bilateral lower extremity venous Doppler ultrasound was negative for DVT. Neurology signed off. She will follow-up outpatient at [facility] in about 4 weeks.  Hospital course complicated by delirium with waxing and waning altered mental status. Her sons strongly believes that she has a UTI. Urine culture wasnegative. She was given additional 3 days of antibiotics. DC'd antibiotics on 09/05/2020.CT head was done which was negative for any acute intracranial findings. Patient was worked up for secondary sources of delirium which was negative. Patient was seen by Neurology on 09/08/19 who note no further work up is indicated and patient would benefit from strict delirium measures. Also of concern was patient's inability to take Revlimid orally  for her MDS. Hematology/Oncology saw patient and recommended holding Revlimid until patient could take it orally.   New events last 24 hours / Subjective: Patient continues to remain altered, not taking much by mouth, refusing oral medications.  This was discussed with patient's sons, whom I met with in the conference room.  We discussed patient's stroke, encephalopathy and delirium as well as refusal of oral medications and food.  We discussed aggressive measures such as cortrak versus PEG in comparison to comfort measures.  Sons were both in agreement to pursue with PEG at this time to give patient a chance to improve outside of the hospital.  Assessment & Plan:   Principal Problem:   Acute encephalopathy Active Problems:   Myelodysplastic syndrome ([place])   Diabetes mellitus ([place])   Pancytopenia ([place])   Cerebral thrombosis with cerebral infarction   Ischemic stroke ([place])   Pressure injury of skin   Acute metabolic encephalopathy, delirium -At baseline, she had been independent although declining since Thanksgiving 2021. Patient seen by neurology, metabolic work up unremarkable. No reversible causes found. No further work-up for delirium recommended -Delirium precaution  Acute CVA -Follow-up outpatient 30-day event monitoring to rule out A. fib.  Cardiology aware -Continue aspirin, Plavix for 3 weeks, followed by aspirin alone -Crestor  -Follow-up with neurology in 4 weeks -Appreciate PT OT, recommending SNF placement  Poor oral intake -Patient sons would like to proceed with PEG placement.  IR consulted  MDS Pancytopenia  -Appreciate oncology, hold Revlimid until patient able to take it orally -Follow-up with her primary oncologist at [facility]  Essential hypertension -Continue losartan. Metoprolol scheduled as patient has not been taking PO   Diabetes mellitus type 2, well controlled -CBGs   Left thyroid  nodule -Follow-up as outpatient   In agreement with  assessment of the pressure ulcer as below:  Pressure Injury 09/03/20 Buttocks Right Stage 2 -  Partial thickness loss of dermis presenting as a shallow open injury with a red, pink wound bed without slough. quarter size  open area (Active)  09/03/20 2023  Location: Buttocks  Location Orientation: Right  Staging: Stage 2 -  Partial thickness loss of dermis presenting as a shallow open injury with a red, pink wound bed without slough.  Wound Description (Comments): quarter size  open area  Present on Admission: No     Nutrition Problem: Inadequate oral intake Etiology: poor appetite   DVT prophylaxis:  enoxaparin (LOVENOX) injection 40 mg Start: 09/02/20 1645 SCDs Start: 08/26/2020 2355  Code Status: Full Family Communication: Discussed with patient's sons this morning Disposition Plan:  Status is: Inpatient  Remains inpatient appropriate because:Altered mental status, Unsafe d/c plan and IV treatments appropriate due to intensity of illness or inability to take PO   Dispo:  Patient From: Home  Planned Disposition: [place]  Expected discharge date: 09/14/20  Medically stable for discharge: No. Pt remains with poor PO intake, PEG placement hopefully 1/7 if approved by IR.  SNF would like to hold off on admissions over the weekend, possible discharge 1/10 if SNF is able to take patient and patient doing well with PEG, tube feedings    Antimicrobials:  Anti-infectives (From admission, onward)   Start     Dose/Rate Route Frequency Ordered Stop   09/05/20 0800  cefdinir (OMNICEF) capsule 300 mg  Status:  Discontinued        300 mg Oral Every 12 hours 09/04/20 1730 09/05/20 1105   09/04/20 0000  cefdinir (OMNICEF) 300 MG capsule  Status:  Discontinued        300 mg Oral 2 times daily 09/04/20 1723 09/05/20    09/02/20 1530  cefTRIAXone (ROCEPHIN) 1 g in sodium chloride 0.9 % 100 mL IVPB  Status:  Discontinued        1 g 200 mL/hr over 30 Minutes Intravenous Every 24  hours 09/02/20 1447 09/04/20 1730   08/30/20 2200  cefTRIAXone (ROCEPHIN) 1 g in sodium chloride 0.9 % 100 mL IVPB        1 g 200 mL/hr over 30 Minutes Intravenous Daily at bedtime 08/30/20 1329 08/31/20 0923   08/29/20 2200  cefTRIAXone (ROCEPHIN) 1 g in sodium chloride 0.9 % 100 mL IVPB  Status:  Discontinued        1 g 200 mL/hr over 30 Minutes Intravenous Daily at bedtime 08/29/20 1031 08/30/20 1329   08/29/20 0600  vancomycin (VANCOCIN) IVPB 1000 mg/200 mL premix  Status:  Discontinued        1,000 mg 200 mL/hr over 60 Minutes Intravenous Every 24 hours 08/28/20 0634 08/28/20 1410   08/28/20 0800  ceFEPIme (MAXIPIME) 2 g in sodium chloride 0.9 % 100 mL IVPB  Status:  Discontinued        2 g 200 mL/hr over 30 Minutes Intravenous Every 12 hours 08/28/20 0634 08/29/20 1031   08/28/20 0645  vancomycin (VANCOREADY) IVPB 1500 mg/300 mL        1,500 mg 150 mL/hr over 120 Minutes Intravenous  Once 08/28/20 0634 08/28/20 1146       Objective: Vitals:   09/09/20 2119 09/10/20 0440 09/10/20 0600 09/10/20 0756  BP: (!) 163/89 (!) 182/102 138/80 (!) 151/109  Pulse: 97 97  95  Resp: (!) 22 (!)  22  (!) 22  Temp: 98.6 F (37 C) 98 F (36.7 C)  97.9 F (36.6 C)  TempSrc: Oral Oral  Oral  SpO2: 100% 97%  96%    Intake/Output Summary (Last 24 hours) at 09/10/2020 1201 Last data filed at 09/10/2020 0900 Gross per 24 hour  Intake 80 ml  Output 1850 ml  Net -1770 ml   There were no vitals filed for this visit.  Examination: General exam: Appears calm  Respiratory system: Clear to auscultation. Respiratory effort normal. Cardiovascular system: S1 & S2 heard, RRR. No pedal edema. Gastrointestinal system: Abdomen is nondistended, soft and nontender. Normal bowel sounds heard. Central nervous system: Alert, unable to interact in a meaningful way, does not answer questions Extremities: Symmetric in appearance bilaterally  Skin: No rashes, lesions or ulcers on exposed skin  Psychiatry:  Remains confused  Data Reviewed: I have personally reviewed following labs and imaging studies  CBC: Recent Labs  Lab 09/04/20 0438 09/04/20 2145 09/06/20 1008 09/08/20 0557  WBC 1.8* 1.7* 1.4* 1.6*  NEUTROABS 1.2*  --  0.9* 1.0*  HGB 9.6* 9.2* 10.1* 10.0*  HCT 27.1* 26.0* 29.2* 29.7*  MCV 85.5 86.4 86.6 86.6  PLT 113* 116* 93* 82*   Basic Metabolic Panel: Recent Labs  Lab 09/04/20 2145 09/06/20 1008 09/08/20 0557 09/09/20 0316 09/10/20 0249  NA 133* 134* 133* 136 138  K 3.7 3.3* 3.1* 3.3* 3.5  CL 97* 99 97* 97* 102  CO2 26 24 22 26 22   GLUCOSE 168* 167* 155* 175* 234*  BUN 13 15 19  24* 27*  CREATININE 0.59 0.64 0.67 0.71 0.77  CALCIUM 8.8* 8.6* 9.1 9.4 9.7  MG  --   --   --  1.8 1.8   GFR: CrCl cannot be calculated (Unknown ideal weight.). Liver Function Tests: Recent Labs  Lab 09/04/20 0438 09/04/20 2145 09/06/20 1008  AST 9* 11* 12*  ALT 13 12 14   ALKPHOS 65 65 62  BILITOT 0.5 0.4 0.6  PROT 5.4* 5.4* 5.6*  ALBUMIN 2.8* 2.9* 3.0*   No results for input(s): LIPASE, AMYLASE in the last 168 hours. No results for input(s): AMMONIA in the last 168 hours. Coagulation Profile: No results for input(s): INR, PROTIME in the last 168 hours. Cardiac Enzymes: No results for input(s): CKTOTAL, CKMB, CKMBINDEX, TROPONINI in the last 168 hours. BNP (last 3 results) No results for input(s): PROBNP in the last 8760 hours. HbA1C: No results for input(s): HGBA1C in the last 72 hours. CBG: Recent Labs  Lab 09/09/20 0607 09/09/20 1133 09/09/20 1659 09/09/20 2122 09/10/20 0819  GLUCAP 165* 189* 134* 277* 223*   Lipid Profile: No results for input(s): CHOL, HDL, LDLCALC, TRIG, CHOLHDL, LDLDIRECT in the last 72 hours. Thyroid Function Tests: No results for input(s): TSH, T4TOTAL, FREET4, T3FREE, THYROIDAB in the last 72 hours. Anemia Panel: No results for input(s): VITAMINB12, FOLATE, FERRITIN, TIBC, IRON, RETICCTPCT in the last 72 hours. Sepsis Labs: No results  for input(s): PROCALCITON, LATICACIDVEN in the last 168 hours.  Recent Results (from the past 240 hour(s))  Resp Panel by RT-PCR (Flu A&B, Covid) Nasopharyngeal Swab     Status: None   Collection Time: 08/31/20 12:10 PM   Specimen: Nasopharyngeal Swab; Nasopharyngeal(NP) swabs in vial transport medium  Result Value Ref Range Status   SARS Coronavirus 2 by RT PCR NEGATIVE NEGATIVE Final    Comment: (NOTE) SARS-CoV-2 target nucleic acids are NOT DETECTED.  The SARS-CoV-2 RNA is generally detectable in upper respiratory specimens during the  acute phase of infection. The lowest concentration of SARS-CoV-2 viral copies this assay can detect is 138 copies/mL. A negative result does not preclude SARS-Cov-2 infection and should not be used as the sole basis for treatment or other patient management decisions. A negative result may occur with  improper specimen collection/handling, submission of specimen other than nasopharyngeal swab, presence of viral mutation(s) within the areas targeted by this assay, and inadequate number of viral copies(<138 copies/mL). A negative result must be combined with clinical observations, patient history, and epidemiological information. The expected result is Negative.  Fact Sheet for Patients:  EntrepreneurPulse.com.au  Fact Sheet for Healthcare Providers:  IncredibleEmployment.be  This test is no t yet approved or cleared by the [place] FDA and  has been authorized for detection and/or diagnosis of SARS-CoV-2 by FDA under an Emergency Use Authorization (EUA). This EUA will remain  in effect (meaning this test can be used) for the duration of the COVID-19 declaration under Section 564(b)(1) of the Act, 21 U.S.C.section 360bbb-3(b)(1), unless the authorization is terminated  or revoked sooner.       Influenza A by PCR NEGATIVE NEGATIVE Final   Influenza B by PCR NEGATIVE NEGATIVE Final    Comment: (NOTE) The  Xpert Xpress SARS-CoV-2/FLU/RSV plus assay is intended as an aid in the diagnosis of influenza from Nasopharyngeal swab specimens and should not be used as a sole basis for treatment. Nasal washings and aspirates are unacceptable for Xpert Xpress SARS-CoV-2/FLU/RSV testing.  Fact Sheet for Patients: EntrepreneurPulse.com.au  Fact Sheet for Healthcare Providers: IncredibleEmployment.be  This test is not yet approved or cleared by the [place] FDA and has been authorized for detection and/or diagnosis of SARS-CoV-2 by FDA under an Emergency Use Authorization (EUA). This EUA will remain in effect (meaning this test can be used) for the duration of the COVID-19 declaration under Section 564(b)(1) of the Act, 21 U.S.C. section 360bbb-3(b)(1), unless the authorization is terminated or revoked.  Performed at [facility], [place] [address], [place], [place] [address]   SARS CORONAVIRUS 2 (TAT 6-24 HRS) Nasopharyngeal Nasopharyngeal Swab     Status: None   Collection Time: 09/01/20  4:38 PM   Specimen: Nasopharyngeal Swab  Result Value Ref Range Status   SARS Coronavirus 2 NEGATIVE NEGATIVE Final    Comment: (NOTE) SARS-CoV-2 target nucleic acids are NOT DETECTED.  The SARS-CoV-2 RNA is generally detectable in upper and lower respiratory specimens during the acute phase of infection. Negative results do not preclude SARS-CoV-2 infection, do not rule out co-infections with other pathogens, and should not be used as the sole basis for treatment or other patient management decisions. Negative results must be combined with clinical observations, patient history, and epidemiological information. The expected result is Negative.  Fact Sheet for Patients: SugarRoll.be  Fact Sheet for Healthcare Providers: https://www.woods-mathews.com/  This test is not yet approved or cleared by the [place] FDA  and  has been authorized for detection and/or diagnosis of SARS-CoV-2 by FDA under an Emergency Use Authorization (EUA). This EUA will remain  in effect (meaning this test can be used) for the duration of the COVID-19 declaration under Se ction 564(b)(1) of the Act, 21 U.S.C. section 360bbb-3(b)(1), unless the authorization is terminated or revoked sooner.  Performed at [facility], [place] [address] [place], [place] [address]   Resp Panel by RT-PCR (Flu A&B, Covid) Nasopharyngeal Swab     Status: None   Collection Time: 09/04/20 10:25 AM   Specimen: Nasopharyngeal  Swab; Nasopharyngeal(NP) swabs in vial transport medium  Result Value Ref Range Status   SARS Coronavirus 2 by RT PCR NEGATIVE NEGATIVE Final    Comment: (NOTE) SARS-CoV-2 target nucleic acids are NOT DETECTED.  The SARS-CoV-2 RNA is generally detectable in upper respiratory specimens during the acute phase of infection. The lowest concentration of SARS-CoV-2 viral copies this assay can detect is 138 copies/mL. A negative result does not preclude SARS-Cov-2 infection and should not be used as the sole basis for treatment or other patient management decisions. A negative result may occur with  improper specimen collection/handling, submission of specimen other than nasopharyngeal swab, presence of viral mutation(s) within the areas targeted by this assay, and inadequate number of viral copies(<138 copies/mL). A negative result must be combined with clinical observations, patient history, and epidemiological information. The expected result is Negative.  Fact Sheet for Patients:  EntrepreneurPulse.com.au  Fact Sheet for Healthcare Providers:  IncredibleEmployment.be  This test is no t yet approved or cleared by the [place] FDA and  has been authorized for detection and/or diagnosis of SARS-CoV-2 by FDA under an Emergency Use Authorization (EUA). This EUA will remain  in  effect (meaning this test can be used) for the duration of the COVID-19 declaration under Section 564(b)(1) of the Act, 21 U.S.C.section 360bbb-3(b)(1), unless the authorization is terminated  or revoked sooner.       Influenza A by PCR NEGATIVE NEGATIVE Final   Influenza B by PCR NEGATIVE NEGATIVE Final    Comment: (NOTE) The Xpert Xpress SARS-CoV-2/FLU/RSV plus assay is intended as an aid in the diagnosis of influenza from Nasopharyngeal swab specimens and should not be used as a sole basis for treatment. Nasal washings and aspirates are unacceptable for Xpert Xpress SARS-CoV-2/FLU/RSV testing.  Fact Sheet for Patients: EntrepreneurPulse.com.au  Fact Sheet for Healthcare Providers: IncredibleEmployment.be  This test is not yet approved or cleared by the [place] FDA and has been authorized for detection and/or diagnosis of SARS-CoV-2 by FDA under an Emergency Use Authorization (EUA). This EUA will remain in effect (meaning this test can be used) for the duration of the COVID-19 declaration under Section 564(b)(1) of the Act, 21 U.S.C. section 360bbb-3(b)(1), unless the authorization is terminated or revoked.  Performed at [facility], [place] [address] [place], [place] [address]   Culture, Urine     Status: Abnormal   Collection Time: 09/04/20  8:20 PM   Specimen: Urine, Random  Result Value Ref Range Status   Specimen Description URINE, RANDOM  Final   Special Requests   Final    NONE Performed at [facility], [place] [address] [place], [place] [address]    Culture 40,000 COLONIES/mL YEAST (A)  Final   Report Status 09/06/2020 FINAL  Final  SARS CORONAVIRUS 2 (TAT 6-24 HRS) Nasopharyngeal Nasopharyngeal Swab     Status: None   Collection Time: 09/08/20  2:11 PM   Specimen: Nasopharyngeal Swab  Result Value Ref Range Status   SARS Coronavirus 2 NEGATIVE NEGATIVE Final    Comment: (NOTE) SARS-CoV-2 target nucleic  acids are NOT DETECTED.  The SARS-CoV-2 RNA is generally detectable in upper and lower respiratory specimens during the acute phase of infection. Negative results do not preclude SARS-CoV-2 infection, do not rule out co-infections with other pathogens, and should not be used as the sole basis for treatment or other patient management decisions. Negative results must be combined with clinical observations, patient history, and epidemiological information. The expected result is Negative.  Fact Sheet for Patients: SugarRoll.be  Fact Sheet for Healthcare Providers: https://www.woods-mathews.com/  This test is not yet approved or cleared by the [place] FDA and  has been authorized for detection and/or diagnosis of SARS-CoV-2 by FDA under an Emergency Use Authorization (EUA). This EUA will remain  in effect (meaning this test can be used) for the duration of the COVID-19 declaration under Se ction 564(b)(1) of the Act, 21 U.S.C. section 360bbb-3(b)(1), unless the authorization is terminated or revoked sooner.  Performed at [facility], [place] [address] [place], [place] [address]       Radiology Studies: No results found.    Scheduled Meds: . aspirin  81 mg Oral Daily  . clopidogrel  75 mg Oral Daily  . enoxaparin (LOVENOX) injection  40 mg Subcutaneous Q24H  . insulin aspart  0-15 Units Subcutaneous TID WC  . lenalidomide  10 mg Oral Daily  . losartan  100 mg Oral Daily  . metoprolol tartrate  2.5 mg Intravenous Q6H  . multivitamin with minerals  1 tablet Oral Daily  . polyethylene glycol  17 g Oral Daily  . rosuvastatin  10 mg Oral q1800  . senna-docusate  1 tablet Oral BID  . vitamin B-12  1,000 mcg Oral Daily   Continuous Infusions:   LOS: 13 days      Time spent: 35 minutes   Dessa Phi, DO [facility] 09/10/2020, 12:01 PM   Available via Epic secure chat 7am-7pm After these hours, please refer  to coverage provider listed on [email or web address]

## 2020-09-10 NOTE — Telephone Encounter (Signed)
FYI- Pt is in the hospital at [facility], Home health will depend on discharge disposition. Referral is still open as of now.

## 2020-09-11 DIAGNOSIS — G9340 Encephalopathy, unspecified: Secondary | ICD-10-CM | POA: Diagnosis not present

## 2020-09-11 LAB — CBC
HCT: 29.9 % — ABNORMAL LOW (ref 36.0–46.0)
Hemoglobin: 10.1 g/dL — ABNORMAL LOW (ref 12.0–15.0)
MCH: 29.2 pg (ref 26.0–34.0)
MCHC: 33.8 g/dL (ref 30.0–36.0)
MCV: 86.4 fL (ref 80.0–100.0)
Platelets: 71 10*3/uL — ABNORMAL LOW (ref 150–400)
RBC: 3.46 MIL/uL — ABNORMAL LOW (ref 3.87–5.11)
RDW: 16.3 % — ABNORMAL HIGH (ref 11.5–15.5)
WBC: 1.4 10*3/uL — CL (ref 4.0–10.5)
nRBC: 0 % (ref 0.0–0.2)

## 2020-09-11 LAB — GLUCOSE, CAPILLARY
Glucose-Capillary: 168 mg/dL — ABNORMAL HIGH (ref 70–99)
Glucose-Capillary: 164 mg/dL — ABNORMAL HIGH (ref 70–99)
Glucose-Capillary: 263 mg/dL — ABNORMAL HIGH (ref 70–99)

## 2020-09-11 LAB — PHOSPHORUS: Phosphorus: 3.6 mg/dL (ref 2.5–4.6)

## 2020-09-11 LAB — BASIC METABOLIC PANEL
Anion gap: 13 (ref 5–15)
BUN: 33 mg/dL — ABNORMAL HIGH (ref 8–23)
CO2: 22 mmol/L (ref 22–32)
Calcium: 9.5 mg/dL (ref 8.9–10.3)
Chloride: 105 mmol/L (ref 98–111)
Creatinine, Ser: 0.76 mg/dL (ref 0.44–1.00)
GFR, Estimated: >60 mL/min (ref >60)
Glucose, Bld: 192 mg/dL — ABNORMAL HIGH (ref 70–99)
Potassium: 3 mmol/L — ABNORMAL LOW (ref 3.5–5.1)
Sodium: 140 mmol/L (ref 135–145)

## 2020-09-11 LAB — PROTIME-INR
INR: 2.2 — ABNORMAL HIGH (ref 0.8–1.2)
Prothrombin Time: 23.6 seconds — ABNORMAL HIGH (ref 11.4–15.2)

## 2020-09-11 LAB — MAGNESIUM: Magnesium: 1.7 mg/dL (ref 1.7–2.4)

## 2020-09-11 MED ORDER — METOPROLOL TARTRATE 5 MG/5ML IV SOLN
5.0000 mg | Freq: Once | INTRAVENOUS | Status: AC
  Administered 2020-09-11: 5 mg via INTRAVENOUS
  Filled 2020-09-11: qty 5

## 2020-09-11 MED ORDER — LOSARTAN POTASSIUM 50 MG PO TABS
100.0000 mg | ORAL_TABLET | Freq: Every day | ORAL | Status: DC
  Administered 2020-09-11 – 2020-09-12 (×2): 100 mg via NASOGASTRIC
  Filled 2020-09-11 (×2): qty 2

## 2020-09-11 MED ORDER — OSMOLITE 1.2 CAL PO LIQD
1000.0000 mL | ORAL | Status: DC
  Administered 2020-09-11: 1000 mL

## 2020-09-11 MED ORDER — INSULIN ASPART 100 UNIT/ML ~~LOC~~ SOLN
0.0000 [IU] | Freq: Four times a day (QID) | SUBCUTANEOUS | Status: DC
  Administered 2020-09-11 (×2): 3 [IU] via SUBCUTANEOUS
  Administered 2020-09-12: 8 [IU] via SUBCUTANEOUS
  Administered 2020-09-12: 11 [IU] via SUBCUTANEOUS

## 2020-09-11 MED ORDER — ASPIRIN 81 MG PO CHEW
81.0000 mg | CHEWABLE_TABLET | Freq: Every day | ORAL | Status: DC
  Administered 2020-09-12: 81 mg via NASOGASTRIC
  Filled 2020-09-11: qty 1

## 2020-09-11 MED ORDER — CHLORHEXIDINE GLUCONATE CLOTH 2 % EX PADS
6.0000 | MEDICATED_PAD | Freq: Every day | CUTANEOUS | Status: DC
  Administered 2020-09-12: 6 via TOPICAL

## 2020-09-11 MED ORDER — ASPIRIN 300 MG RE SUPP
300.0000 mg | Freq: Every day | RECTAL | Status: DC
  Administered 2020-09-11: 300 mg via RECTAL
  Filled 2020-09-11: qty 1

## 2020-09-11 MED ORDER — PHYTONADIONE 5 MG PO TABS
5.0000 mg | ORAL_TABLET | Freq: Once | ORAL | Status: AC
  Administered 2020-09-11: 5 mg via NASOGASTRIC
  Filled 2020-09-11 (×2): qty 1

## 2020-09-11 MED ORDER — POTASSIUM CHLORIDE IN NACL 40-0.9 MEQ/L-% IV SOLN
INTRAVENOUS | Status: DC
  Filled 2020-09-11 (×3): qty 1000

## 2020-09-11 MED ORDER — ROSUVASTATIN CALCIUM 5 MG PO TABS
10.0000 mg | ORAL_TABLET | Freq: Every day | ORAL | Status: DC
  Administered 2020-09-11 – 2020-09-12 (×2): 10 mg via NASOGASTRIC
  Filled 2020-09-11 (×3): qty 2

## 2020-09-11 MED ORDER — PROSOURCE TF PO LIQD
45.0000 mL | Freq: Every day | ORAL | Status: DC
  Administered 2020-09-11 – 2020-09-12 (×2): 45 mL
  Filled 2020-09-11 (×2): qty 45

## 2020-09-11 NOTE — Progress Notes (Signed)
Physical Therapy Treatment Patient Details Name: Kara Griffin MRN: [id number] DOB: 03-16-1936 Today's Date: 09/11/2020    History of Present Illness 85 y.o. female with history of myelodysplastic syndrome being followed by Dr. Florene Glen at [facility] on Revlimid, pancytopenia, diabetes mellitus, hypertension and hyperlipidemia was brought to the ED by patient's son after patient was found to be increasingly confused last 2 days. On August 19, 2020, patient was taken to [facility] after patient had a fall at home. CT and xray were negative during that ED visit and pt was d/c'd home. Pt underwent MRI 12/23 which revealed small R bagal ganglia lacunar infarct. MRI 09/02/20 revealed newly seen 5-7 punctate foci of acute infarctions scattered  within the inferior cerebellum on both sides. No large confluent  infarction. Being a different vascular territory, this suggests  embolic disease from the heart or ascending aorta.    PT Comments    Pt continues to demonstrate little to no initiation with all functional mobility, thereby requiring TAx2 with assistance from rehab tech this date for all bed mobility and for 1x sit to stand from EOB in "3 muskateer" stance/technique. To maintain static standing for up to ~20 seconds pt required bilat knee block and maxAx2. Pt did show some progress with sitting balance as she initially required maxA to maintain her balance due to a posterior lean, but when forearms were placed to rest on thighs she was able to hold her balance for up to ~15 seconds with min guard only before needing further assistance. She displays delayed minimal reactional strategies with LOB. Will continue to follow acutely. Updated goals. Current recommendations remain appropriate.    Follow Up Recommendations  SNF;Supervision/Assistance - 24 hour     Equipment Recommendations  Wheelchair (measurements PT);Wheelchair cushion (measurements PT);Hospital bed;Other (comment) (mechanical lift)     Recommendations for Other Services       Precautions / Restrictions Precautions Precautions: Fall Precaution Comments: mittens, NG tube Restrictions Weight Bearing Restrictions: No    Mobility  Bed Mobility Overal bed mobility: Needs Assistance Bed Mobility: Sit to Supine;Supine to Sit     Supine to sit: Total assist;+2 for physical assistance;+2 for safety/equipment;HOB elevated Sit to supine: Total assist;+2 for physical assistance;+2 for safety/equipment   General bed mobility comments: TAx2 to transition to supine <> sit EOB with HOB elevated, with pt unable to follow commands and thus no initiation.  Transfers Overall transfer level: Needs assistance Equipment used: 2 person hand held assist Transfers: Sit to/from Stand          Lateral/Scoot Transfers: Total assist;+2 physical assistance;+2 safety/equipment General transfer comment: No initiation noted from pt despite max cues and extra time provided. Bilat knee block "3 muskateer" technique with pt's arms over therapist on one side and tech on other.  Ambulation/Gait             General Gait Details: deferred due to safety concerns.   Stairs             Wheelchair Mobility    Modified Rankin (Stroke Patients Only) Modified Rankin (Stroke Patients Only) Pre-Morbid Rankin Score: No significant disability Modified Rankin: Severe disability     Balance Overall balance assessment: Needs assistance Sitting-balance support: Bilateral upper extremity supported;Feet supported Sitting balance-Leahy Scale: Poor Sitting balance - Comments: Pt reaching restlesslywith 1-2 UEs at times but otherwise hand-over-hand cues for placement on bed or lap. Improved balance with decreased posterior lean when hands or elbows were on lap. Able to hold sitting balance without  LOB for up to ~15 seconds with only min guard until LOB resulting in min-maxA to recover and maintain balance other times. Delayed min reactional  strategies to LOB. Postural control: Posterior lean Standing balance support: Bilateral upper extremity supported Standing balance-Leahy Scale: Zero Standing balance comment: MaxAx2 to stand for ~20 seconds in "3 muskateer" stance                            Cognition Arousal/Alertness: Awake/alert Behavior During Therapy: Flat affect;Restless Overall Cognitive Status: Impaired/Different from baseline Area of Impairment: Following commands;Safety/judgement;Problem solving;Attention;Awareness                   Current Attention Level: Divided   Following Commands: Follows one step commands inconsistently;Follows one step commands with increased time Safety/Judgement: Decreased awareness of safety;Decreased awareness of deficits Awareness: Intellectual Problem Solving: Slow processing;Decreased initiation;Difficulty sequencing;Requires verbal cues;Requires tactile cues General Comments: Pt nonverbal throughout session, initially with eyes closed but opened with bed mob. Pt restless reaching out often and grasping at therapists. Able to follow ~15% of multi-modal simple cues. Decreased safety awareness as she had little and delayed reactional strategies as support for sitting was removed.      Exercises      General Comments General comments (skin integrity, edema, etc.): Heels elevated off bed with pillow under bilat lower legs end of session to prevent pressure ulcers.      Pertinent Vitals/Pain Pain Assessment: Faces Faces Pain Scale: Hurts even more Pain Location: Generalized with mobility Pain Descriptors / Indicators: Grimacing;Moaning;Crying Pain Intervention(s): Monitored during session;Limited activity within patient's tolerance;Repositioned    Home Living                      Prior Function            PT Goals (current goals can now be found in the care plan section) Acute Rehab PT Goals Patient Stated Goal: to improve cognition and  mobility PT Goal Formulation: Patient unable to participate in goal setting Time For Goal Achievement: 09/25/20 Potential to Achieve Goals: Fair Progress towards PT goals: Progressing toward goals;Goals downgraded-see care plan (minimally)    Frequency    Min 2X/week      PT Plan Frequency needs to be updated;Equipment recommendations need to be updated    Co-evaluation              AM-PAC PT "6 Clicks" Mobility   Outcome Measure  Help needed turning from your back to your side while in a flat bed without using bedrails?: A Lot Help needed moving from lying on your back to sitting on the side of a flat bed without using bedrails?: Total Help needed moving to and from a bed to a chair (including a wheelchair)?: Total Help needed standing up from a chair using your arms (e.g., wheelchair or bedside chair)?: Total Help needed to walk in hospital room?: Total Help needed climbing 3-5 steps with a railing? : Total 6 Click Score: 7    End of Session Equipment Utilized During Treatment: Gait belt Activity Tolerance: Other (comment);Treatment limited secondary to medical complications (Comment) (limited by ability to follow directions) Patient left: in bed;with call bell/phone within reach;with bed alarm set   PT Visit Diagnosis: Unsteadiness on feet (R26.81);Other abnormalities of gait and mobility (R26.89);Muscle weakness (generalized) (M62.81);Pain;Difficulty in walking, not elsewhere classified (R26.2);Other symptoms and signs involving the nervous system (R29.898) Pain - Right/Left:  (generalized) Pain -  part of body:  (generalized)     Time: [id number] PT Time Calculation (min) (ACUTE ONLY): 19 min  Charges:  $Neuromuscular Re-education: 8-22 mins                     Moishe Spice, PT, DPT Acute Rehabilitation Services  Pager: [phone] Office: [place] 09/11/2020, 12:36 PM

## 2020-09-11 NOTE — Progress Notes (Signed)
Nutrition Follow-up  DOCUMENTATION CODES:   Not applicable  INTERVENTION:  Monitor magnesium, potassium, and phosphorus daily for at least 3 days, MD to replete as needed, as pt is at risk for refeeding syndrome.  Begin TF via Cortrak: -Begin Osmolite 1.2 @ 10ml/hr, advance 35ml/hr Q8H until goal rate of 26ml/hr (1435ml) is reached -9ml Prosource TF daily  At goal, TF regimen will provide 1768 kcals, 90 grams protein, 1177ml free water Meets 100% of needs   If pt tolerates continuous TF well, can transition to bolus feeding once PEG is placed and ready for use. Recommend: -1 carton (279ml) Osmolite 1.5 five times daily with 47ml free water flush before and after each bolus -31ml Prosource TF daily with 58ml free water flush before and after administration  Bolus TF regimen would provide 1815 kcals, 85 grams protein, 981ml free water (1669ml total free water with flushes)   NUTRITION DIAGNOSIS:   Inadequate oral intake related to poor appetite as evidenced by per patient/family report.  ongoing  GOAL:   Patient will meet greater than or equal to 90% of their needs  Will address with TF  MONITOR:   PO intake,Supplement acceptance,Weight trends,Labs,I & O's  REASON FOR ASSESSMENT:   Consult Enteral/tube feeding initiation and management  ASSESSMENT:   Pt admitted with acute R basal ganglia lacunar infarct. PMH includes myelodysplastic syndrome, pancytopenia, DM, HTN, HLD.  Per RN, pt began refusing all foods and medications a few days ago.  RD consulted to provide TF recommendations as pt is tentatively scheduled to have PEG placed 09/16/20. Note pt on Cortrak list in the meantime. Discussed pt with Cortrak team. Cortrak has been placed with gastric tip. TF can be initiated as described above. If this is well tolerated by pt, will likely transition to bolus feeding once PEG is placed.  Pt has primarily been NPO since last RD assessment; however, pt documented to have  eaten 10-100% x 2 recorded dysphagia 2 meals.   UOP: 18110ml x24 hours  Note that no BM documented since 12/31. Recommend initiation of bowel regimen.  Labs: K+ 3.0 (L), CBGs 168-188 Medications: ss novolog Q6H IVF: NS w/ KCl 47mEq/L @ 24ml/hr  NUTRITION - FOCUSED PHYSICAL EXAM: Unable to perform at this time. Will attempt at follow-up.  Diet Order:   Diet Order            Diet NPO time specified  Diet effective now           Diet - low sodium heart healthy                 EDUCATION NEEDS:   No education needs have been identified at this time  Skin:  Skin Assessment: Skin Integrity Issues: Skin Integrity Issues:: Stage II Stage II: R buttocks  Last BM:  12/31  Height:   Ht Readings from Last 1 Encounters:  08/25/20 5\' 1"  (1.549 m)    Weight:   Wt Readings from Last 1 Encounters:  08/25/20 72.1 kg    BMI:  There is no height or weight on file to calculate BMI.  Estimated Nutritional Needs:   Kcal:  1600-1800  Protein:  80-95 grams  Fluid:  >1.6L/d   Larkin Ina, MS, RD, LDN RD pager number and weekend/on-call pager number located in [place].

## 2020-09-11 NOTE — Plan of Care (Signed)
  Problem: Ischemic Stroke/TIA Tissue Perfusion: Goal: Complications of ischemic stroke/TIA will be minimized Outcome: Progressing   Problem: Safety: Goal: Ability to remain free from injury will improve Outcome: Progressing

## 2020-09-11 NOTE — Progress Notes (Signed)
Occupational Therapy Treatment Patient Details Name: Kara Griffin MRN: [id number] DOB: 03/05/1936 Today's Date: 09/11/2020    History of present illness 85 y.o. female with history of myelodysplastic syndrome being followed by Dr. Florene Glen at [facility] on Revlimid, pancytopenia, diabetes mellitus, hypertension and hyperlipidemia was brought to the ED by patient's son after patient was found to be increasingly confused last 2 days. On August 19, 2020, patient was taken to [facility] after patient had a fall at home. CT and xray were negative during that ED visit and pt was d/c'd home. Pt underwent MRI 12/23 which revealed small R bagal ganglia lacunar infarct. MRI 09/02/20 revealed newly seen 5-7 punctate foci of acute infarctions scattered  within the inferior cerebellum on both sides. No large confluent  infarction. Being a different vascular territory, this suggests  embolic disease from the heart or ascending aorta.   OT comments  Pt received in bed with eyes shut and her son present. Pt's son reports pt was more interactive this morning. Pt was nonverbal throughout session, did not respond with yes/no, and kept eyes shut majority of the session. Pt continues to require totalA+2 for all mobility. OOB mobility deferred secondary to pt lethargy and decreased active engagement during session. Attempted to arouse pt with music, pt briefly kept eyes open. She would respond to noxious stimulus. HR 70s-94bpm throughout session. Pt will continue to benefit from skilled OT services to maximize safety and independence with ADL/IADL and functional mobility. Will continue to follow acutely and progress as tolerated.    Follow Up Recommendations  SNF    Equipment Recommendations  Wheelchair cushion (measurements OT);Wheelchair (measurements OT);Hospital bed;3 in 1 bedside commode;Other (comment) (if dc home)    Recommendations for Other Services PT consult;Speech consult    Precautions / Restrictions  Precautions Precautions: Fall Precaution Comments: mittens, NG tube Restrictions Weight Bearing Restrictions: No       Mobility Bed Mobility Overal bed mobility: Needs Assistance Bed Mobility: Rolling Rolling: Total assist;+2 for physical assistance;+2 for safety/equipment   Supine to sit: Total assist;+2 for physical assistance;+2 for safety/equipment;HOB elevated Sit to supine: Total assist;+2 for physical assistance;+2 for safety/equipment   General bed mobility comments: attempted to roll pt R<>L, pt resisting bed mobility  Transfers Overall transfer level: Needs assistance Equipment used: 2 person hand held assist Transfers: Sit to/from Stand          Lateral/Scoot Transfers: Total assist;+2 physical assistance;+2 safety/equipment General transfer comment: deferred    Balance Overall balance assessment: Needs assistance Sitting-balance support: Bilateral upper extremity supported;Feet supported Sitting balance-Leahy Scale: Poor Sitting balance - Comments: Pt reaching restlesslywith 1-2 UEs at times but otherwise hand-over-hand cues for placement on bed or lap. Improved balance with decreased posterior lean when hands or elbows were on lap. Able to hold sitting balance without LOB for up to ~15 seconds with only min guard until LOB resulting in min-maxA to recover and maintain balance other times. Delayed min reactional strategies to LOB. Postural control: Posterior lean Standing balance support: Bilateral upper extremity supported Standing balance-Leahy Scale: Zero Standing balance comment: MaxAx2 to stand for ~20 seconds in "3 muskateer" stance                           ADL either performed or assessed with clinical judgement   ADL Overall ADL's : Needs assistance/impaired Eating/Feeding: Total assistance Eating/Feeding Details (indicate cue type and reason): pt not with NG tube placed Grooming: Total assistance Grooming  Details (indicate cue type and  reason): totalA to wash face, pt not actively engaging in ADL                   Toilet Transfer Details (indicate cue type and reason): pt unable to initiate steps however MAX A +2 for sit<>stand x4           General ADL Comments: deferred mobility secondary to level of arousal, RN notified of pt's lethargy and decreased active participation during session     Vision       Perception     Praxis      Cognition Arousal/Alertness: Lethargic Behavior During Therapy: Flat affect Overall Cognitive Status: Difficult to assess Area of Impairment: Following commands;Safety/judgement;Problem solving;Attention;Awareness                   Current Attention Level: Divided   Following Commands: Follows one step commands inconsistently;Follows one step commands with increased time Safety/Judgement: Decreased awareness of safety;Decreased awareness of deficits Awareness: Intellectual Problem Solving: Slow processing;Decreased initiation;Difficulty sequencing;Requires verbal cues;Requires tactile cues General Comments: pt nonverbal throughout session, kept eyes shut majority of the session. Pt following one step commands <10% of the time. Attempted to arouse pt with her favoite song (per pt's son), pt opened her eyes briefly and returned to keeping them closed;        Exercises Exercises: General Upper Extremity;General Lower Extremity General Exercises - Upper Extremity Shoulder Flexion: PROM;Both;10 reps;Seated (bed level) Shoulder Extension: PROM;Both;10 reps;Seated Elbow Flexion: PROM;Both;10 reps;Seated Elbow Extension: PROM;Both;10 reps;Seated Wrist Flexion: PROM;Both;10 reps;Seated Wrist Extension: PROM;Both;10 reps;Seated Digit Composite Flexion: PROM;Both;10 reps;Seated Composite Extension: PROM;Both;10 reps;Seated General Exercises - Lower Extremity Ankle Circles/Pumps: PROM;Both;10 reps;Seated Quad Sets: PROM;Both;10 reps;Seated Gluteal Sets: PROM;Both;10  reps;Seated Hip ABduction/ADduction: PROM;Both;10 reps;Seated   Shoulder Instructions       General Comments elevated heels off bed with pillow    Pertinent Vitals/ Pain       Pain Assessment: Faces Faces Pain Scale: Hurts little more Pain Location: Generalized with mobility Pain Descriptors / Indicators: Grimacing;Guarding Pain Intervention(s): Monitored during session;Limited activity within patient's tolerance  Home Living                                          Prior Functioning/Environment              Frequency  Min 2X/week        Progress Toward Goals  OT Goals(current goals can now be found in the care plan section)  Progress towards OT goals: Not progressing toward goals - comment (decreased active participation in session)  Acute Rehab OT Goals Patient Stated Goal: to improve cognition and mobility OT Goal Formulation: With patient Time For Goal Achievement: 09/25/20 Potential to Achieve Goals: Fair ADL Goals Pt Will Perform Grooming: with set-up;with supervision;standing Pt Will Perform Upper Body Dressing: with set-up;with supervision;sitting Pt Will Perform Lower Body Dressing: sitting/lateral leans;sit to/from stand;with adaptive equipment;with min assist Pt Will Transfer to Toilet: bedside commode;with min assist;stand pivot transfer Pt Will Perform Toileting - Clothing Manipulation and hygiene: sitting/lateral leans;sit to/from stand;with min assist Additional ADL Goal #1: Pt will follow 1-3 step commands consistently during ADL completion.  Plan Discharge plan remains appropriate;Frequency remains appropriate    Co-evaluation                 AM-PAC OT "6 Clicks" Daily Activity  Outcome Measure   Help from another person eating meals?: Total Help from another person taking care of personal grooming?: Total Help from another person toileting, which includes using toliet, bedpan, or urinal?: Total Help from another  person bathing (including washing, rinsing, drying)?: Total Help from another person to put on and taking off regular upper body clothing?: Total Help from another person to put on and taking off regular lower body clothing?: Total 6 Click Score: 6    End of Session    OT Visit Diagnosis: Unsteadiness on feet (R26.81);Other abnormalities of gait and mobility (R26.89);Muscle weakness (generalized) (M62.81);History of falling (Z91.81);Pain Pain - part of body:  (Back)   Activity Tolerance Patient limited by lethargy   Patient Left in bed;with call bell/phone within reach;with bed alarm set   Nurse Communication Mobility status;Other (comment) (level of arousal)        Time: 1411-1430 OT Time Calculation (min): 19 min  Charges: OT General Charges $OT Visit: 1 Visit OT Treatments $Self Care/Home Management : 8-22 mins  Helene Kelp OTR/L Acute Rehabilitation Services Office: [place] 09/11/2020, 2:51 PM

## 2020-09-11 NOTE — Progress Notes (Addendum)
PROGRESS NOTE    Kara Griffin  [name] DOB: 1935/11/26 DOA: 08/06/2020 PCP: Einar Pheasant, MD     Brief Narrative:  Kara Griffin is an 85 year old female with MDS, DM2, HTN who was admitted 08/13/2020 with acute basal ganglia lacunar infarct secondary to small vessel disease. She completed a stroke work-up. MRA brain no large vessel obstruction. Moderate L A1 stenosis. Carotid Doppler ultrasound unremarkable. 2D echo EF 75%, no source of embolus. EEG suggestive of mild diffuse encephalopathy, nonspecific etiology. No seizures or epileptiform discharges were seen throughout the recording. LDL 35. Hemoglobin A1c 6.0. Seen by neurology/stroke team with recommendation for aspirin 81 mg daily and Plavix 75 mg daily for 3 weeks followed by aspirin alone. PT OT recommended SNF.  MRI brain was done on 09/02/2020 which showednew 5-7 punctate foci of acute infarctions scattered within the inferior cerebellum on both sides, suggestive of embolic disease from the heart or ascending aorta.Cardiology was contacted for arrangement of 30-day cardiac event monitoring outpatient.  She had a CT a head and neck completed which was unremarkable. Bilateral lower extremity venous Doppler ultrasound was negative for DVT. Neurology signed off. She will follow-up outpatient at [facility] in about 4 weeks.  Hospital course complicated by delirium with waxing and waning altered mental status. Her sons strongly believes that she has a UTI. Urine culture wasnegative. She was given additional 3 days of antibiotics. DC'd antibiotics on 09/05/2020.CT head was done which was negative for any acute intracranial findings. Patient was worked up for secondary sources of delirium which was negative. Patient was seen by Neurology on 09/08/19 who note no further work up is indicated and patient would benefit from strict delirium measures. Also of concern was patient's inability to take Revlimid orally  for her MDS. Hematology/Oncology saw patient and recommended holding Revlimid until patient could take it orally.   Due to continued refusal of food and medication, family elected for PEG placement.  Patient must be off of Plavix for 5 days, PEG tentatively scheduled for 1/12.  In the meanwhile, cortrak ordered for enteral nutrition.  New events last 24 hours / Subjective: Sons at bedside.  Patient has remained with altered mental status, continues to not take much by mouth.  This morning, patient remains alert but not interactive in a meaningful way.  Assessment & Plan:   Principal Problem:   Acute encephalopathy Active Problems:   Myelodysplastic syndrome ([place])   Diabetes mellitus ([place])   Pancytopenia ([place])   Cerebral thrombosis with cerebral infarction   Ischemic stroke ([place])   Pressure injury of skin   Acute metabolic encephalopathy, delirium -At baseline, she had been independent although declining since Thanksgiving 2021. Patient seen by neurology, metabolic work up unremarkable. No reversible causes found. No further work-up for delirium recommended -Delirium precaution  Acute CVA -Follow-up outpatient 30-day event monitoring to rule out A. fib.  Cardiology aware -Continue aspirin, Plavix for 3 weeks, followed by aspirin alone -Continue crestor  -Follow-up with neurology in 4 weeks -Appreciate PT OT, recommending SNF placement  Poor oral intake -Patient sons would like to proceed with PEG placement.  IR consulted, tentatively PEG placement scheduled for 1/12 after Plavix washout. -Cortrak placed 1/7 for enteral nutrition.  Dietitian consulted for tube feed recommendations  MDS Pancytopenia  -Appreciate oncology, hold Revlimid until patient able to take it orally -I personally spoke with Dr. Florene Glen, patient's oncologist, regarding family's concern for holding Revlimid.  Revlimid cannot be given per PEG tube due to its toxicity and capsule  cannot be crushed or opened  (confirmed with pharmacist at [facility] as well as pharmacist at [facility] per Dr. Florene Glen).  Dr. Florene Glen agreed with this assessment, did not have any other alternative recommendation for MDS treatment at this time.  Once patient is discharged to skilled nursing facility and improved, she should follow-up with him in the office in 4 to 6 weeks and they can discuss other treatment options  Essential hypertension -Resume losartan  Diabetes mellitus type 2, well controlled -CBGs   Left thyroid nodule -Follow-up as outpatient  Elevated INR -Vit K x 1, given pending PEG placement next week. INR goal < 1.5   Hypokalemia -Replace, trend   In agreement with assessment of the pressure ulcer as below:  Pressure Injury 09/03/20 Buttocks Right Stage 2 -  Partial thickness loss of dermis presenting as a shallow open injury with a red, pink wound bed without slough. quarter size  open area (Active)  09/03/20 2023  Location: Buttocks  Location Orientation: Right  Staging: Stage 2 -  Partial thickness loss of dermis presenting as a shallow open injury with a red, pink wound bed without slough.  Wound Description (Comments): quarter size  open area  Present on Admission: No     Nutrition Problem: Inadequate oral intake Etiology: poor appetite   DVT prophylaxis:  enoxaparin (LOVENOX) injection 40 mg Start: 09/02/20 1645 SCDs Start: 08/07/2020 2355  Code Status: Full Family Communication: Discussed with patient's sons this morning  Disposition Plan:  Status is: Inpatient  Remains inpatient appropriate because:Altered mental status, Unsafe d/c plan and IV treatments appropriate due to intensity of illness or inability to take PO   Dispo:  Patient From: Home  Planned Disposition: [place] [facility]  Expected discharge date: 09/14/20  Medically stable for discharge: No. Pt remains with poor PO intake, PEG placement pending 1/12. I discussed with sons that our hope is that  with increase in nutrition, patient's delirium and mentation will improve. However, in the case that patient continues to remain in this state and even worsens, we may have to discussion hospice care.     Antimicrobials:  Anti-infectives (From admission, onward)   Start     Dose/Rate Route Frequency Ordered Stop   09/05/20 0800  cefdinir (OMNICEF) capsule 300 mg  Status:  Discontinued        300 mg Oral Every 12 hours 09/04/20 1730 09/05/20 1105   09/04/20 0000  cefdinir (OMNICEF) 300 MG capsule  Status:  Discontinued        300 mg Oral 2 times daily 09/04/20 1723 09/05/20    09/02/20 1530  cefTRIAXone (ROCEPHIN) 1 g in sodium chloride 0.9 % 100 mL IVPB  Status:  Discontinued        1 g 200 mL/hr over 30 Minutes Intravenous Every 24 hours 09/02/20 1447 09/04/20 1730   08/30/20 2200  cefTRIAXone (ROCEPHIN) 1 g in sodium chloride 0.9 % 100 mL IVPB        1 g 200 mL/hr over 30 Minutes Intravenous Daily at bedtime 08/30/20 1329 08/31/20 0923   08/29/20 2200  cefTRIAXone (ROCEPHIN) 1 g in sodium chloride 0.9 % 100 mL IVPB  Status:  Discontinued        1 g 200 mL/hr over 30 Minutes Intravenous Daily at bedtime 08/29/20 1031 08/30/20 1329   08/29/20 0600  vancomycin (VANCOCIN) IVPB 1000 mg/200 mL premix  Status:  Discontinued        1,000 mg 200 mL/hr over 60 Minutes  Intravenous Every 24 hours 08/28/20 0634 08/28/20 1410   08/28/20 0800  ceFEPIme (MAXIPIME) 2 g in sodium chloride 0.9 % 100 mL IVPB  Status:  Discontinued        2 g 200 mL/hr over 30 Minutes Intravenous Every 12 hours 08/28/20 0634 08/29/20 1031   08/28/20 0645  vancomycin (VANCOREADY) IVPB 1500 mg/300 mL        1,500 mg 150 mL/hr over 120 Minutes Intravenous  Once 08/28/20 0634 08/28/20 1146       Objective: Vitals:   09/10/20 2148 09/10/20 2344 09/11/20 0630 09/11/20 0815  BP: (!) 180/82 (!) 160/72 (!) 158/72 (!) 185/70  Pulse: 97 79  84  Resp: (!) 21   18  Temp: 99.1 F (37.3 C) 97.8 F (36.6 C)  98.5 F (36.9 C)   TempSrc: Axillary Axillary    SpO2: 98% 97%  97%   No intake or output data in the 24 hours ending 09/11/20 1052 There were no vitals filed for this visit.  Examination: General exam: Appears calm and comfortable  Central nervous system: Alert but noninteractive    Data Reviewed: I have personally reviewed following labs and imaging studies  CBC: Recent Labs  Lab 09/04/20 2145 09/06/20 1008 09/08/20 0557 09/11/20 0213  WBC 1.7* 1.4* 1.6* 1.4*  NEUTROABS  --  0.9* 1.0*  --   HGB 9.2* 10.1* 10.0* 10.1*  HCT 26.0* 29.2* 29.7* 29.9*  MCV 86.4 86.6 86.6 86.4  PLT 116* 93* 82* 71*   Basic Metabolic Panel: Recent Labs  Lab 09/06/20 1008 09/08/20 0557 09/09/20 0316 09/10/20 0249 09/11/20 0213  NA 134* 133* 136 138 140  K 3.3* 3.1* 3.3* 3.5 3.0*  CL 99 97* 97* 102 105  CO2 24 22 26 22 22   GLUCOSE 167* 155* 175* 234* 192*  BUN 15 19 24* 27* 33*  CREATININE 0.64 0.67 0.71 0.77 0.76  CALCIUM 8.6* 9.1 9.4 9.7 9.5  MG  --   --  1.8 1.8  --    GFR: CrCl cannot be calculated (Unknown ideal weight.). Liver Function Tests: Recent Labs  Lab 09/04/20 2145 09/06/20 1008  AST 11* 12*  ALT 12 14  ALKPHOS 65 62  BILITOT 0.4 0.6  PROT 5.4* 5.6*  ALBUMIN 2.9* 3.0*   No results for input(s): LIPASE, AMYLASE in the last 168 hours. No results for input(s): AMMONIA in the last 168 hours. Coagulation Profile: Recent Labs  Lab 09/11/20 0213  INR 2.2*   Cardiac Enzymes: No results for input(s): CKTOTAL, CKMB, CKMBINDEX, TROPONINI in the last 168 hours. BNP (last 3 results) No results for input(s): PROBNP in the last 8760 hours. HbA1C: No results for input(s): HGBA1C in the last 72 hours. CBG: Recent Labs  Lab 09/10/20 0819 09/10/20 1239 09/10/20 1645 09/10/20 2144 09/11/20 0633  GLUCAP 223* 188* 205* 188* 168*   Lipid Profile: No results for input(s): CHOL, HDL, LDLCALC, TRIG, CHOLHDL, LDLDIRECT in the last 72 hours. Thyroid Function Tests: No results for  input(s): TSH, T4TOTAL, FREET4, T3FREE, THYROIDAB in the last 72 hours. Anemia Panel: No results for input(s): VITAMINB12, FOLATE, FERRITIN, TIBC, IRON, RETICCTPCT in the last 72 hours. Sepsis Labs: No results for input(s): PROCALCITON, LATICACIDVEN in the last 168 hours.  Recent Results (from the past 240 hour(s))  SARS CORONAVIRUS 2 (TAT 6-24 HRS) Nasopharyngeal Nasopharyngeal Swab     Status: None   Collection Time: 09/01/20  4:38 PM   Specimen: Nasopharyngeal Swab  Result Value Ref Range Status  SARS Coronavirus 2 NEGATIVE NEGATIVE Final    Comment: (NOTE) SARS-CoV-2 target nucleic acids are NOT DETECTED.  The SARS-CoV-2 RNA is generally detectable in upper and lower respiratory specimens during the acute phase of infection. Negative results do not preclude SARS-CoV-2 infection, do not rule out co-infections with other pathogens, and should not be used as the sole basis for treatment or other patient management decisions. Negative results must be combined with clinical observations, patient history, and epidemiological information. The expected result is Negative.  Fact Sheet for Patients: SugarRoll.be  Fact Sheet for Healthcare Providers: https://www.woods-mathews.com/  This test is not yet approved or cleared by the [place] FDA and  has been authorized for detection and/or diagnosis of SARS-CoV-2 by FDA under an Emergency Use Authorization (EUA). This EUA will remain  in effect (meaning this test can be used) for the duration of the COVID-19 declaration under Se ction 564(b)(1) of the Act, 21 U.S.C. section 360bbb-3(b)(1), unless the authorization is terminated or revoked sooner.  Performed at [facility], [place] [address], [place], [place] [address]   Resp Panel by RT-PCR (Flu A&B, Covid) Nasopharyngeal Swab     Status: None   Collection Time: 09/04/20 10:25 AM   Specimen: Nasopharyngeal Swab; Nasopharyngeal(NP)  swabs in vial transport medium  Result Value Ref Range Status   SARS Coronavirus 2 by RT PCR NEGATIVE NEGATIVE Final    Comment: (NOTE) SARS-CoV-2 target nucleic acids are NOT DETECTED.  The SARS-CoV-2 RNA is generally detectable in upper respiratory specimens during the acute phase of infection. The lowest concentration of SARS-CoV-2 viral copies this assay can detect is 138 copies/mL. A negative result does not preclude SARS-Cov-2 infection and should not be used as the sole basis for treatment or other patient management decisions. A negative result may occur with  improper specimen collection/handling, submission of specimen other than nasopharyngeal swab, presence of viral mutation(s) within the areas targeted by this assay, and inadequate number of viral copies(<138 copies/mL). A negative result must be combined with clinical observations, patient history, and epidemiological information. The expected result is Negative.  Fact Sheet for Patients:  EntrepreneurPulse.com.au  Fact Sheet for Healthcare Providers:  IncredibleEmployment.be  This test is no t yet approved or cleared by the [place] FDA and  has been authorized for detection and/or diagnosis of SARS-CoV-2 by FDA under an Emergency Use Authorization (EUA). This EUA will remain  in effect (meaning this test can be used) for the duration of the COVID-19 declaration under Section 564(b)(1) of the Act, 21 U.S.C.section 360bbb-3(b)(1), unless the authorization is terminated  or revoked sooner.       Influenza A by PCR NEGATIVE NEGATIVE Final   Influenza B by PCR NEGATIVE NEGATIVE Final    Comment: (NOTE) The Xpert Xpress SARS-CoV-2/FLU/RSV plus assay is intended as an aid in the diagnosis of influenza from Nasopharyngeal swab specimens and should not be used as a sole basis for treatment. Nasal washings and aspirates are unacceptable for Xpert Xpress  SARS-CoV-2/FLU/RSV testing.  Fact Sheet for Patients: EntrepreneurPulse.com.au  Fact Sheet for Healthcare Providers: IncredibleEmployment.be  This test is not yet approved or cleared by the [place] FDA and has been authorized for detection and/or diagnosis of SARS-CoV-2 by FDA under an Emergency Use Authorization (EUA). This EUA will remain in effect (meaning this test can be used) for the duration of the COVID-19 declaration under Section 564(b)(1) of the Act, 21 U.S.C. section 360bbb-3(b)(1), unless the authorization is terminated or revoked.  Performed at  [facility], [place] [address] [place], [place] [address]   Culture, Urine     Status: Abnormal   Collection Time: 09/04/20  8:20 PM   Specimen: Urine, Random  Result Value Ref Range Status   Specimen Description URINE, RANDOM  Final   Special Requests   Final    NONE Performed at [facility], [place] [address] [place], [place] [address]    Culture 40,000 COLONIES/mL YEAST (A)  Final   Report Status 09/06/2020 FINAL  Final  SARS CORONAVIRUS 2 (TAT 6-24 HRS) Nasopharyngeal Nasopharyngeal Swab     Status: None   Collection Time: 09/08/20  2:11 PM   Specimen: Nasopharyngeal Swab  Result Value Ref Range Status   SARS Coronavirus 2 NEGATIVE NEGATIVE Final    Comment: (NOTE) SARS-CoV-2 target nucleic acids are NOT DETECTED.  The SARS-CoV-2 RNA is generally detectable in upper and lower respiratory specimens during the acute phase of infection. Negative results do not preclude SARS-CoV-2 infection, do not rule out co-infections with other pathogens, and should not be used as the sole basis for treatment or other patient management decisions. Negative results must be combined with clinical observations, patient history, and epidemiological information. The expected result is Negative.  Fact Sheet for Patients: SugarRoll.be  Fact Sheet for  Healthcare Providers: https://www.woods-mathews.com/  This test is not yet approved or cleared by the [place] FDA and  has been authorized for detection and/or diagnosis of SARS-CoV-2 by FDA under an Emergency Use Authorization (EUA). This EUA will remain  in effect (meaning this test can be used) for the duration of the COVID-19 declaration under Se ction 564(b)(1) of the Act, 21 U.S.C. section 360bbb-3(b)(1), unless the authorization is terminated or revoked sooner.  Performed at [facility], [place] [address] [place], [place] [address]       Radiology Studies: No results found.    Scheduled Meds: . aspirin  300 mg Rectal Daily  . clotrimazole  1 Applicatorful Vaginal QHS  . enoxaparin (LOVENOX) injection  40 mg Subcutaneous Q24H  . insulin aspart  0-15 Units Subcutaneous Q6H  . metoprolol tartrate  5 mg Intravenous Q6H  . phytonadione  5 mg Per NG tube Once   Continuous Infusions: . 0.9 % NaCl with KCl 40 mEq / L 75 mL/hr at 09/11/20 1040     LOS: 14 days      Time spent: 35 minutes   Dessa Phi, DO [facility] 09/11/2020, 10:52 AM   Available via Epic secure chat 7am-7pm After these hours, please refer to coverage provider listed on [email or web address]

## 2020-09-11 NOTE — Procedures (Signed)
Cortrak  Person Inserting Tube:  Esaw Dace, RD Tube Type:  Cortrak - 43 inches Tube Location:  Left nare Initial Placement:  Stomach Secured by: Bridle Technique Used to Measure Tube Placement:  Documented cm marking at nare/ corner of mouth Cortrak Secured At:  66 cm    Cortrak Tube Team Note:  Consult received to place a Cortrak feeding tube.   No x-ray is required. RN may begin using tube.   If the tube becomes dislodged please keep the tube and contact the Cortrak team at [email or web address] ([email or web address]) for replacement.  If after hours and replacement cannot be delayed, place a NG tube and confirm placement with an abdominal x-ray.    Kerman Passey MS, RDN, LDN, CNSC Registered Dietitian III Clinical Nutrition RD Pager and On-Call Pager Number Located in [place]

## 2020-09-11 NOTE — Progress Notes (Signed)
Patient is noted to have reduced urine output. Bladder scan shows patient has greater than 315 ml, notified MD. Will continue to monitor.

## 2020-09-11 NOTE — Telephone Encounter (Signed)
Good afternoon!  Ok Thank you and your welcome!

## 2020-09-11 NOTE — Consult Note (Signed)
Chief Complaint: Patient was seen in consultation today for dysphagia/percutaneous gastrostomy tube placement.  Referring Physician(s): [facility])  Supervising Physician: Daryll Brod  Patient Status: [facility] - In-pt  History of Present Illness: Kara Griffin is a 85 y.o. female with a past medical history of hypertension, hypercholesterolemia, MDS, diabetes mellitus, and anemia. She has been admitted to [facility] since 08/25/2020 for management of increasing confusion. She was admitted for further management of acute encephalopathy. Work-up revealed a small right basal ganglia lacunar infarct- neurology was consulted, however further metabolic work-up unremarkable. Urine culture negative. Hospital course complicated by delirium with waxing and waning AMS. She remains altered and refusing all oral intake (food, water, oral medications). NGT placed today.  IR consulted by Dr. Maylene Roes for possible image-guided percutaneous gastrostomy tube placement. Patient laying in bed, eyes open, nonverbal, does not nod appropriately to yes/no answers, intermittently follows simple commands (opens mouth to command)- history difficult to obtain secondary to this. Son at bedside.  LD Plavix 09/10/2020. LD Lovenox 09/10/2020.   Past Medical History:  Diagnosis Date  . Anemia   . Diabetes mellitus ([place])   . Environmental allergies   . Hypercholesterolemia   . Hypertension   . Myelodysplastic syndrome ([place])    Sees Dr Jerrye Noble    Past Surgical History:  Procedure Laterality Date  . ABDOMINAL HYSTERECTOMY    . [place]  . DILATION AND CURETTAGE OF UTERUS    . TONSILECTOMY, ADENOIDECTOMY, BILATERAL MYRINGOTOMY AND TUBES  1963  . TOTAL ABDOMINAL HYSTERECTOMY W/ BILATERAL SALPINGOOPHORECTOMY  1985   secondary to abnormal cells and abnormal uterine bleeding    Allergies: Ibuprofen  Medications: Prior to Admission medications   Medication Sig Start Date End Date  Taking? Authorizing Provider  ACCU-CHEK GUIDE test strip USE TO CHECK BLOOD SUGARS TWICE DAILY 06/30/20  Yes Einar Pheasant, MD  Accu-Chek Softclix Lancets lancets CHECK BLOOD SUGAR TWICE DAILY 12/30/19  Yes Einar Pheasant, MD  azelastine (ASTELIN) 0.1 % nasal spray PLACE 1 SPRAY INTO BOTH NOSTRILS TWICE DAILY Patient taking differently: Place 1 spray into both nostrils 2 (two) times daily. 05/18/20  Yes Einar Pheasant, MD  blood glucose meter kit and supplies KIT Dispense based on patient and insurance preference. Use to check sugars twice daily. DX E11.9 09/27/19  Yes Einar Pheasant, MD  Calcium Carbonate-Vitamin D 600-400 MG-UNIT tablet Take 1 tablet by mouth at bedtime. Reported on 01/15/2016   Yes [provider]  cyanocobalamin 500 MCG tablet Take 1,000 mcg by mouth daily.    Yes [provider]  fluticasone (FLONASE) 50 MCG/ACT nasal spray Place 2 sprays into both nostrils daily.   Yes [provider]  lenalidomide (REVLIMID) 10 MG capsule Take 10 mg by mouth daily.   Yes [provider]  losartan (COZAAR) 25 MG tablet TAKE 1 TABLET(25 MG) BY MOUTH TWICE DAILY Patient taking differently: Take 25 mg by mouth at bedtime. 05/21/20  Yes Einar Pheasant, MD  metFORMIN (GLUCOPHAGE) 1000 MG tablet TAKE 1 TABLET(1000 MG) BY MOUTH TWICE DAILY Patient taking differently: Take 1,000 mg by mouth 2 (two) times daily with a meal. 02/21/20  Yes Einar Pheasant, MD  Multiple Vitamin (MULTIVITAMIN) tablet Take 1 tablet by mouth daily.   Yes [provider]  Multiple Vitamins-Minerals (PRESERVISION AREDS 2+MULTI VIT) CAPS Take 1 capsule by mouth 2 (two) times daily.   Yes [provider]  sitaGLIPtin (JANUVIA) 100 MG tablet TAKE 1 TABLET BY MOUTH DAILY Patient taking differently: Take  100 mg by mouth daily. 05/21/20  Yes Einar Pheasant, MD  aspirin 81 MG tablet Take 1 tablet (81 mg total) by mouth daily. 09/04/20 08/30/21  Kayleen Memos, DO  clopidogrel  (PLAVIX) 75 MG tablet Take 1 tablet (75 mg total) by mouth daily for 21 days. 09/02/20 09/23/20  Kayleen Memos, DO  losartan (COZAAR) 25 MG tablet Take 3 tablets (75 mg total) by mouth daily. 09/05/20 10/05/20  Kayleen Memos, DO  meclizine (ANTIVERT) 12.5 MG tablet Take 1 tablet (12.5 mg total) by mouth 3 (three) times daily as needed for dizziness. 09/02/20   Kayleen Memos, DO  polyethylene glycol (MIRALAX / GLYCOLAX) 17 g packet Take 17 g by mouth daily as needed. 09/02/20   Kayleen Memos, DO  potassium chloride SA (KLOR-CON) 20 MEQ tablet Take 1 tablet (20 mEq total) by mouth daily for 5 days. 09/02/20 09/07/20  Kayleen Memos, DO  rosuvastatin (CRESTOR) 10 MG tablet Take 1 tablet (10 mg total) by mouth daily. 09/04/20 12/03/20  Kayleen Memos, DO     Family History  Problem Relation Age of Onset  . Heart disease Father        Deceased (MI) - 43  . Diabetes Mother        Deceased  . Thyroid cancer Mother   . Osteoarthritis Mother   . Asthma Other   . Diabetes Brother   . Heart disease Maternal Grandmother        myocardial infarction - 59  . Asthma Maternal Grandmother   . Heart disease Paternal Grandmother        myocardial infarction-52  . Heart disease Brother   . Sinusitis Brother   . Depression Son   . Diabetes Son   . Depression Son   . Diabetes Son   . Depression Son   . Breast cancer Neg Hx     Social History   Socioeconomic History  . Marital status: Divorced    Spouse name: Not on file  . Number of children: 3  . Years of education: Not on file  . Highest education level: Not on file  Occupational History  . Occupation: retired Games developer  Tobacco Use  . Smoking status: Never Smoker  . Smokeless tobacco: Never Used  Vaping Use  . Vaping Use: Never used  Substance and Sexual Activity  . Alcohol use: No    Alcohol/week: 0.0 standard drinks  . Drug use: No  . Sexual activity: Never  Other Topics Concern  . Not on file  Social History Narrative  . Not on  file   Social Determinants of Health   Financial Resource Strain: Not on file  Food Insecurity: Not on file  Transportation Needs: Not on file  Physical Activity: Not on file  Stress: Not on file  Social Connections: Not on file     Review of Systems: A 12 point ROS discussed and pertinent positives are indicated in the HPI above.  All other systems are negative.  Review of Systems  Unable to perform ROS: Mental status change    Vital Signs: BP (!) 185/70 (BP Location: Right Arm)   Pulse 84   Temp 98.5 F (36.9 C)   Resp 18   LMP 08/13/1984   SpO2 97%   Physical Exam Vitals and nursing note reviewed.  Constitutional:      General: She is not in acute distress. Cardiovascular:     Rate and Rhythm: Normal rate and regular rhythm.  Heart sounds: Normal heart sounds. No murmur heard.   Pulmonary:     Effort: Pulmonary effort is normal. No respiratory distress.     Breath sounds: Normal breath sounds. No wheezing.  Skin:    General: Skin is warm and dry.  Neurological:     Comments: Awake but nonverbal, does not nod appropriately to yes/no answers, intermittently follows simple commands (opens mouth to command)      MD Evaluation Airway: WNL Heart: WNL Abdomen: WNL Chest/ Lungs: WNL ASA  Classification: 3 Mallampati/Airway Score: Two   Imaging: CT ANGIO HEAD W OR WO CONTRAST  Result Date: 09/02/2020 CLINICAL DATA:  Stroke/TIA. Assess intracranial and extracranial arteries. EXAM: CT ANGIOGRAPHY HEAD AND NECK TECHNIQUE: Multidetector CT imaging of the head and neck was performed using the standard protocol during bolus administration of intravenous contrast. Multiplanar CT image reconstructions and MIPs were obtained to evaluate the vascular anatomy. Carotid stenosis measurements (when applicable) are obtained utilizing NASCET criteria, using the distal internal carotid diameter as the denominator. CONTRAST:  19m OMNIPAQUE IOHEXOL 350 MG/ML SOLN COMPARISON:   MRI of the brain September 03, 2019. FINDINGS: CT HEAD FINDINGS Brain: No evidence of acute infarction, hemorrhage, hydrocephalus, extra-axial collection or mass lesion/mass effect. Acute small infarcts are better demonstrated on recent MRI of the brain performed earlier today. Vascular: Calcified plaques in the bilateral carotid siphons and bilateral vertebral arteries. Skull: Normal. Negative for fracture or focal lesion. Sinuses: Imaged portions are clear. Orbits: No acute finding. Review of the MIP images confirms the above findings CTA NECK FINDINGS Aortic arch: Standard branching. Imaged portion shows no evidence of aneurysm or dissection. No significant stenosis of the major arch vessel origins. Right carotid system: Increased tortuosity of the innominate and proximal right common carotid artery and distal cervical segment of the right ICA. Mild atherosclerotic changes in the right carotid bifurcation. No evidence of dissection, stenosis (50% or greater) or occlusion. Left carotid system: Mild luminal irregularity cervical segment of the left ICA, likely related to mild atherosclerotic disease without stenosis. There is also increased tortuosity of the upper cervical segment of the left ICA. No evidence of dissection, stenosis (50% or greater) or occlusion. Vertebral arteries: The left vertebral artery is dominant. No evidence of dissection, stenosis (50% or greater) or occlusion. Skeleton: Degenerative changes of the cervical. No aggressive bone lesion seen. Other neck: Heterogeneous left thyroid lobe nodule measuring 1.2 cm. Upper chest: Biapical scarring and ground-glass opacities. Review of the MIP images confirms the above findings CTA HEAD FINDINGS Anterior circulation: No significant stenosis, proximal occlusion, aneurysm, or vascular malformation. Posterior circulation: Minutes if caliber of the V4 segment of the right vertebral artery with a dominant left vertebral artery. No significant stenosis,  proximal occlusion, aneurysm, or vascular malformation. Venous sinuses: As permitted by contrast timing, patent. Anatomic variants: None. Review of the MIP images confirms the above findings IMPRESSION: 1. No large vessel occlusion, hemodynamically significant stenosis, or evidence of dissection. 2. Heterogeneous left thyroid lobe nodule measuring 1.2 cm. Electronically Signed   By: KPedro EarlsM.D.   On: 09/02/2020 18:17   DG Chest 1 View  Result Date: 08/19/2020 CLINICAL DATA:  Weakness with increased cough. Fall today. EXAM: CHEST  1 VIEW COMPARISON:  Radiograph 01/26/2016 FINDINGS: Heart is normal in size. Unchanged mediastinal contours. There is chronic hyperinflation. Bronchial thickening appears chronic but increased from prior exam. Subsegmental opacities in both mid lung zones and left lung base. Chronic biapical pleuroparenchymal scarring. No significant pleural effusion. No  pneumothorax. No acute osseous abnormalities are seen. IMPRESSION: 1. Subsegmental opacities in both mid lung zones and left lung base, favor atelectasis or scarring, however atypical pneumonia could have a similar appearance. 2. Chronic but increased bronchial thickening may represent acute bronchitis. Chronic hyperinflation. Electronically Signed   By: Keith Rake M.D.   On: 08/19/2020 15:23   DG Lumbar Spine 2-3 Views  Result Date: 08/19/2020 CLINICAL DATA:  Weakness and cough. Fall today. EXAM: LUMBAR SPINE - 2-3 VIEW COMPARISON:  Lumbar radiograph 07/16/2018 FINDINGS: Slight dextroscoliotic curvature unchanged from prior exam. No listhesis. The vertebral body heights are preserved. Disc space narrowing and endplate spurring at J1-O8, L4-L5, and L5-S1. There is lower lumbar facet hypertrophy. No acute fracture. The sacroiliac joints are congruent. IMPRESSION: 1. No acute fracture or subluxation of the lumbar spine. 2. Mild scoliosis with stable degenerative change from 2019. Electronically Signed    By: Keith Rake M.D.   On: 08/19/2020 15:25   DG Abd 1 View  Result Date: 08/29/2020 CLINICAL DATA:  Abdominal pain and bloating. EXAM: ABDOMEN - 1 VIEW COMPARISON:  None. FINDINGS: The bowel gas pattern is normal, with large amount of air seen throughout the colon. No radio-opaque calculi or other significant radiographic abnormality are seen. Subcentimeter phleboliths are noted within lower pelvis. IMPRESSION: Negative. Electronically Signed   By: Virgina Norfolk M.D.   On: 08/29/2020 22:54   CT HEAD WO CONTRAST  Result Date: 09/04/2020 CLINICAL DATA:  Cerebral infarction, follow-up examination EXAM: CT HEAD WITHOUT CONTRAST TECHNIQUE: Contiguous axial images were obtained from the base of the skull through the vertex without intravenous contrast. COMPARISON:  MRI 09/02/2020 FINDINGS: Brain: Normal anatomic configuration. Parenchymal volume loss is commensurate with the patient's age. Mild periventricular white matter changes are present likely reflecting the sequela of small vessel ischemia. The known punctate infarcts within the right putamen and cerebellum are not well appreciated on this examination. No abnormal intra or extra-axial mass lesion or fluid collection. No abnormal mass effect or midline shift. No evidence of acute intracranial hemorrhage or infarct. Ventricular size is normal. Cerebellum unremarkable. Vascular: No asymmetric hyperdense vasculature at the skull base. Skull: Intact Sinuses/Orbits: Paranasal sinuses are clear. Orbits are unremarkable. Other: Mastoid air cells and middle ear cavities are clear. IMPRESSION: Known punctate infarcts within the right putamen and cerebellum noted on prior MRI examination are not well appreciated on this examination. No definite evidence of acute intracranial hemorrhage or infarct. Electronically Signed   By: Fidela Salisbury MD   On: 09/04/2020 21:10   CT Head Wo Contrast  Result Date: 08/19/2020 CLINICAL DATA:  85 year old female with  history of minor head trauma from a fall today. EXAM: CT HEAD WITHOUT CONTRAST CT CERVICAL SPINE WITHOUT CONTRAST TECHNIQUE: Multidetector CT imaging of the head and cervical spine was performed following the standard protocol without intravenous contrast. Multiplanar CT image reconstructions of the cervical spine were also generated. COMPARISON:  None. No priors. FINDINGS: CT HEAD FINDINGS Brain: Mild cerebral atrophy. Patchy and confluent areas of decreased attenuation are noted throughout the deep and periventricular white matter of the cerebral hemispheres bilaterally, compatible with chronic microvascular ischemic disease. No evidence of acute infarction, hemorrhage, hydrocephalus, extra-axial collection or mass lesion/mass effect. Vascular: No hyperdense vessel or unexpected calcification. Skull: Normal. Negative for fracture or focal lesion. Sinuses/Orbits: No acute finding. Other: None. CT CERVICAL SPINE FINDINGS Alignment: Normal. Skull base and vertebrae: No acute fracture. No primary bone lesion or focal pathologic process. Soft tissues and spinal canal: No  prevertebral fluid or swelling. No visible canal hematoma. Disc levels: Very mild multilevel degenerative disc disease and facet arthropathy. Upper chest: Bilateral apical pleuroparenchymal thickening and architectural distortion, most compatible with areas of chronic post infectious or inflammatory scarring. Other: None. IMPRESSION: 1. No evidence of significant acute traumatic injury to the skull, brain or cervical spine. 2. Mild cerebral atrophy with chronic microvascular ischemic changes in the cerebral white matter. 3. Very mild multilevel degenerative disc disease and facet arthropathy. Electronically Signed   By: Vinnie Langton M.D.   On: 08/19/2020 18:42   CT ANGIO NECK W OR WO CONTRAST  Result Date: 09/02/2020 CLINICAL DATA:  Stroke/TIA. Assess intracranial and extracranial arteries. EXAM: CT ANGIOGRAPHY HEAD AND NECK TECHNIQUE:  Multidetector CT imaging of the head and neck was performed using the standard protocol during bolus administration of intravenous contrast. Multiplanar CT image reconstructions and MIPs were obtained to evaluate the vascular anatomy. Carotid stenosis measurements (when applicable) are obtained utilizing NASCET criteria, using the distal internal carotid diameter as the denominator. CONTRAST:  22m OMNIPAQUE IOHEXOL 350 MG/ML SOLN COMPARISON:  MRI of the brain September 03, 2019. FINDINGS: CT HEAD FINDINGS Brain: No evidence of acute infarction, hemorrhage, hydrocephalus, extra-axial collection or mass lesion/mass effect. Acute small infarcts are better demonstrated on recent MRI of the brain performed earlier today. Vascular: Calcified plaques in the bilateral carotid siphons and bilateral vertebral arteries. Skull: Normal. Negative for fracture or focal lesion. Sinuses: Imaged portions are clear. Orbits: No acute finding. Review of the MIP images confirms the above findings CTA NECK FINDINGS Aortic arch: Standard branching. Imaged portion shows no evidence of aneurysm or dissection. No significant stenosis of the major arch vessel origins. Right carotid system: Increased tortuosity of the innominate and proximal right common carotid artery and distal cervical segment of the right ICA. Mild atherosclerotic changes in the right carotid bifurcation. No evidence of dissection, stenosis (50% or greater) or occlusion. Left carotid system: Mild luminal irregularity cervical segment of the left ICA, likely related to mild atherosclerotic disease without stenosis. There is also increased tortuosity of the upper cervical segment of the left ICA. No evidence of dissection, stenosis (50% or greater) or occlusion. Vertebral arteries: The left vertebral artery is dominant. No evidence of dissection, stenosis (50% or greater) or occlusion. Skeleton: Degenerative changes of the cervical. No aggressive bone lesion seen. Other neck:  Heterogeneous left thyroid lobe nodule measuring 1.2 cm. Upper chest: Biapical scarring and ground-glass opacities. Review of the MIP images confirms the above findings CTA HEAD FINDINGS Anterior circulation: No significant stenosis, proximal occlusion, aneurysm, or vascular malformation. Posterior circulation: Minutes if caliber of the V4 segment of the right vertebral artery with a dominant left vertebral artery. No significant stenosis, proximal occlusion, aneurysm, or vascular malformation. Venous sinuses: As permitted by contrast timing, patent. Anatomic variants: None. Review of the MIP images confirms the above findings IMPRESSION: 1. No large vessel occlusion, hemodynamically significant stenosis, or evidence of dissection. 2. Heterogeneous left thyroid lobe nodule measuring 1.2 cm. Electronically Signed   By: KPedro EarlsM.D.   On: 09/02/2020 18:17   CT Cervical Spine Wo Contrast  Result Date: 08/19/2020 CLINICAL DATA:  85year old female with history of minor head trauma from a fall today. EXAM: CT HEAD WITHOUT CONTRAST CT CERVICAL SPINE WITHOUT CONTRAST TECHNIQUE: Multidetector CT imaging of the head and cervical spine was performed following the standard protocol without intravenous contrast. Multiplanar CT image reconstructions of the cervical spine were also generated. COMPARISON:  None.  No priors. FINDINGS: CT HEAD FINDINGS Brain: Mild cerebral atrophy. Patchy and confluent areas of decreased attenuation are noted throughout the deep and periventricular white matter of the cerebral hemispheres bilaterally, compatible with chronic microvascular ischemic disease. No evidence of acute infarction, hemorrhage, hydrocephalus, extra-axial collection or mass lesion/mass effect. Vascular: No hyperdense vessel or unexpected calcification. Skull: Normal. Negative for fracture or focal lesion. Sinuses/Orbits: No acute finding. Other: None. CT CERVICAL SPINE FINDINGS Alignment: Normal. Skull  base and vertebrae: No acute fracture. No primary bone lesion or focal pathologic process. Soft tissues and spinal canal: No prevertebral fluid or swelling. No visible canal hematoma. Disc levels: Very mild multilevel degenerative disc disease and facet arthropathy. Upper chest: Bilateral apical pleuroparenchymal thickening and architectural distortion, most compatible with areas of chronic post infectious or inflammatory scarring. Other: None. IMPRESSION: 1. No evidence of significant acute traumatic injury to the skull, brain or cervical spine. 2. Mild cerebral atrophy with chronic microvascular ischemic changes in the cerebral white matter. 3. Very mild multilevel degenerative disc disease and facet arthropathy. Electronically Signed   By: Vinnie Langton M.D.   On: 08/19/2020 18:42   MR ANGIO HEAD WO CONTRAST  Result Date: 08/28/2020 CLINICAL DATA:  Stroke follow-up. Acute right basal ganglia infarct on earlier MRI. EXAM: MRA HEAD WITHOUT CONTRAST TECHNIQUE: Angiographic images of the Circle of Willis were obtained using MRA technique without intravenous contrast. COMPARISON:  None. FINDINGS: The visualized distal vertebral arteries are widely patent to the basilar with the left being strongly dominant. Patent AICA and SCA origins are identified bilaterally. The basilar artery is widely patent. Posterior communicating arteries are not identified and may be diminutive or absent. The PCAs are patent without evidence of a significant proximal stenosis. There are distal cervical internal carotid artery loops bilaterally. The intracranial internal carotid arteries are patent without evidence of significant stenosis allowing for motion artifact through the anterior genu of the left ICA. ACAs and MCAs are patent without evidence of a proximal branch occlusion or significant M1 stenosis. Assessment for branch vessel stenosis is limited by motion. There is a moderate stenosis versus motion artifact in the distal  left A1 segment. No aneurysm is identified. IMPRESSION: 1. No large vessel occlusion. 2. Moderate left A1 stenosis versus motion artifact. Electronically Signed   By: Logan Bores M.D.   On: 08/28/2020 03:11   MR BRAIN WO CONTRAST  Result Date: 09/02/2020 CLINICAL DATA:  Myelodysplastic syndrome. Acute presentation with confusion. Weakness. EXAM: MRI HEAD WITHOUT CONTRAST TECHNIQUE: Multiplanar, multiecho pulse sequences of the brain and surrounding structures were obtained without intravenous contrast. COMPARISON:  08/28/2020 FINDINGS: Brain: Subcentimeter acute infarction within the putamen on the right is unchanged since the prior exam. No new supratentorial finding. Newly seen today are 5-7 punctate foci of acute infarctions scattered within the inferior cerebellum on both sides. No large confluent infarction. Elsewhere, chronic small-vessel ischemic changes are seen within the deep and subcortical white matter of both cerebral hemispheres. No large vessel territory infarction. No mass, hemorrhage, hydrocephalus or extra-axial collection. Vascular: Major vessels at the base of the brain show flow. Skull and upper cervical spine: Negative Sinuses/Orbits: Clear/normal Other: None IMPRESSION: 1. Unchanged subcentimeter acute infarction within the putamen on the right. 2. Newly seen 5-7 punctate foci of acute infarctions scattered within the inferior cerebellum on both sides. No large confluent infarction. Being a different vascular territory, this suggests embolic disease from the heart or ascending aorta. 3. Chronic small-vessel ischemic changes elsewhere throughout the brain as outlined above.  4. Findings discussed with hospitalist physician at the time of interpretation. Electronically Signed   By: Nelson Chimes M.D.   On: 09/02/2020 14:31   MR BRAIN WO CONTRAST  Result Date: 08/28/2020 CLINICAL DATA:  Dizziness.  Memory loss. EXAM: MRI HEAD WITHOUT CONTRAST TECHNIQUE: Multiplanar, multiecho pulse  sequences of the brain and surrounding structures were obtained without intravenous contrast. COMPARISON:  Head CT 08/19/2020 FINDINGS: Brain: There is a 4 mm acute infarct at the anteroinferior aspect of the right basal ganglia. Small T2 hyperintensities scattered throughout the cerebral white matter bilaterally are nonspecific but compatible with mild chronic small vessel ischemic disease. Two small adjacent chronic hemorrhages are noted in the subcortical white matter of the left frontal lobe. Mild cerebral atrophy is within normal limits for age. Vascular: Major intracranial vascular flow voids are preserved. Skull and upper cervical spine: Unremarkable bone marrow signal. Sinuses/Orbits: Bilateral cataract extraction. Paranasal sinuses and mastoid air cells are clear. Other: None. IMPRESSION: 1. Acute right basal ganglia lacunar infarct. 2. Mild chronic small vessel ischemic disease. Electronically Signed   By: Logan Bores M.D.   On: 08/28/2020 01:18   CT ABDOMEN PELVIS W CONTRAST  Result Date: 08/19/2020 CLINICAL DATA:  Right lower quadrant abdominal pain. Fall at home. Dizziness. EXAM: CT ABDOMEN AND PELVIS WITH CONTRAST TECHNIQUE: Multidetector CT imaging of the abdomen and pelvis was performed using the standard protocol following bolus administration of intravenous contrast. CONTRAST:  12m OMNIPAQUE IOHEXOL 300 MG/ML  SOLN COMPARISON:  None. FINDINGS: Lower chest: Right middle lobe bronchiectasis with distal mucous plugging. Reticulonodular opacities in the lingula. Subsegmental opacities in the left lower lobe likely atelectasis. No pleural fluid. No pericardial fluid. No basilar rib fracture. Hepatobiliary: Elongated right lobe of the liver. No focal hepatic abnormality. No evidence of perihepatic hematoma or hepatic injury. Distended gallbladder without pericholecystic inflammation or calcified gallstone. No biliary dilatation. Pancreas: No ductal dilatation or inflammation. Spleen:  Subcentimeter hypodensity in the anterior spleen is nonspecific, typically benign. No splenomegaly. No splenic injury or perisplenic hematoma. Adrenals/Urinary Tract: No renal or adrenal injury. There is a 14 mm left adrenal nodule. Lobulated bilateral renal contours. Bilateral cortical cysts. Symmetric prominence of both renal pelvis likely extrarenal pelvis configuration. No perinephric edema. No renal calculi. The urinary bladder is distended. No bladder wall thickening or injury. Stomach/Bowel: Tiny hiatal hernia. The stomach is nondistended. No bowel obstruction, inflammation, or evidence of injury. Sigmoid colon is redundant. Small volume of colonic stool. The appendix is not confidently visualized. Vascular/Lymphatic: Aortic atherosclerosis. Patent portal vein. No acute vascular findings or evidence of injury. No adenopathy. Reproductive: Status post hysterectomy. No adnexal masses. Other: No free air or free fluid. Postsurgical changes the anterior abdominal wall. No body wall hernia. Musculoskeletal: No acute fracture of the pelvis, lumbar spine, or included lower ribs. Multilevel degenerative change with vacuum phenomena and facet hypertrophy in the lumbar spine. IMPRESSION: 1. The appendix is not visualized, there is no evidence of appendicitis. 2. No acute traumatic injury to the abdomen or pelvis. 3. Distended gallbladder without pericholecystic inflammation or calcified gallstone. If there is clinical concern for acute gallbladder pathology, recommend right upper quadrant ultrasound. 4. Right middle lobe bronchiectasis with distal mucous plugging. Reticulonodular opacities in the lingula. Findings suggest prior mycobacterium avium infection. 5. Left adrenal nodule measuring 14 mm, nonspecific. In the absence of known malignancy, this is likely benign. Aortic Atherosclerosis (ICD10-I70.0). Electronically Signed   By: MKeith RakeM.D.   On: 08/19/2020 18:43   DG Pelvis Portable  Result Date:  08/19/2020 CLINICAL DATA:  Weakness. Fall today. EXAM: PORTABLE PELVIS 1-2 VIEWS COMPARISON:  Pelvis and right hip radiograph on 07/16/2018 FINDINGS: The cortical margins of the bony pelvis are intact. No fracture. Pubic symphysis and sacroiliac joints are congruent. Bilateral acetabular spurring. Both femoral heads are well-seated in the respective acetabula. IMPRESSION: No pelvic fracture. Electronically Signed   By: Keith Rake M.D.   On: 08/19/2020 15:26   DG CHEST PORT 1 VIEW  Result Date: 08/28/2020 CLINICAL DATA:  Fever EXAM: PORTABLE CHEST 1 VIEW COMPARISON:  08/19/2020 FINDINGS: Borderline heart size. Blunting at the lateral left costophrenic sulcus that is stable and most likely from mediastinal fat. Reticulation of lung markings. There is signs of chronic indolent infection/scarring in the lower lungs on recent abdominal CT. There is no edema, consolidation, effusion, or pneumothorax. IMPRESSION: Stable from prior.  No acute finding. Electronically Signed   By: Monte Fantasia M.D.   On: 08/28/2020 07:05   EEG adult  Result Date: 08/28/2020 Lora Havens, MD     08/28/2020 12:15 PM Patient Name: Domonic Hiscox MRN: [id number] Epilepsy Attending: Lora Havens Referring Physician/Provider: Dr Antony Contras Date: 08/28/2020 Duration: 24.43 mins Patient history: 85yo F with R basal ganglia infarct and cognitive impairment. EEG to evaluate for seizure. Level of alertness: Awake AEDs during EEG study: None Technical aspects: This EEG study was done with scalp electrodes positioned according to the 10-20 International system of electrode placement. Electrical activity was acquired at a sampling rate of _0  and reviewed with a high frequency filter of _1  and a low frequency filter of _2 . EEG data were recorded continuously and digitally stored. Description: The posterior dominant rhythm consists of 8 Hz activity of moderate voltage (25-35 uV) seen predominantly in posterior head  regions, symmetric and reactive to eye opening and eye closing. EEG showed intermittent generalized 3 to 6 Hz theta-delta slowing.  Hyperventilation and photic stimulation were not performed.   ABNORMALITY -Intermittent slow, generalized IMPRESSION: This study is suggestive of mild diffuse encephalopathy, nonspecific etiology. No seizures or epileptiform discharges were seen throughout the recording. Lora Havens   ECHOCARDIOGRAM COMPLETE  Result Date: 08/28/2020    ECHOCARDIOGRAM REPORT   Patient Name:   Benson Setting Date of Exam: 08/28/2020 Medical Rec #:  [id number]              Height:       61.0 in Accession #:    [id number]             Weight:       159.0 lb Date of Birth:  June 01, 1936              BSA:          1.713 m Patient Age:    25 years               BP:           118/49 mmHg Patient Gender: F                      HR:           88 bpm. Exam Location:  Inpatient Procedure: 2D Echo, Cardiac Doppler and Color Doppler Indications:    Stroke 434.91 / I163.9  History:        Patient has no prior history of Echocardiogram examinations.                 Risk  Factors:Hypertension and Diabetes.  Sonographer:    Bernadene Person RDCS Referring Phys: [place]  1. Left ventricular ejection fraction, by estimation, is >75%. The left ventricle has normal function. The left ventricle has no regional wall motion abnormalities. Left ventricular diastolic parameters are consistent with Grade I diastolic dysfunction (impaired relaxation).  2. Right ventricular systolic function is normal. The right ventricular size is normal. There is mildly elevated pulmonary artery systolic pressure.  3. The mitral valve is normal in structure. No evidence of mitral valve regurgitation. No evidence of mitral stenosis.  4. The aortic valve is tricuspid. There is mild calcification of the aortic valve. There is mild thickening of the aortic valve. Aortic valve regurgitation is not visualized. No  aortic stenosis is present.  5. The inferior vena cava is normal in size with greater than 50% respiratory variability, suggesting right atrial pressure of 3 mmHg. FINDINGS  Left Ventricle: Left ventricular ejection fraction, by estimation, is >75%. The left ventricle has normal function. The left ventricle has no regional wall motion abnormalities. The left ventricular internal cavity size was normal in size. There is no left ventricular hypertrophy. Left ventricular diastolic parameters are consistent with Grade I diastolic dysfunction (impaired relaxation). Normal left ventricular filling pressure. Right Ventricle: The right ventricular size is normal. No increase in right ventricular wall thickness. Right ventricular systolic function is normal. There is mildly elevated pulmonary artery systolic pressure. The tricuspid regurgitant velocity is 2.96  m/s, and with an assumed right atrial pressure of 3 mmHg, the estimated right ventricular systolic pressure is 66.2 mmHg. Left Atrium: Left atrial size was normal in size. Right Atrium: Right atrial size was normal in size. Pericardium: There is no evidence of pericardial effusion. Mitral Valve: The mitral valve is normal in structure. No evidence of mitral valve regurgitation. No evidence of mitral valve stenosis. Tricuspid Valve: The tricuspid valve is normal in structure. Tricuspid valve regurgitation is mild . No evidence of tricuspid stenosis. Aortic Valve: The aortic valve is tricuspid. There is mild calcification of the aortic valve. There is mild thickening of the aortic valve. There is mild aortic valve annular calcification. Aortic valve regurgitation is not visualized. No aortic stenosis  is present. Aortic valve mean gradient measures 5.4 mmHg. Aortic valve peak gradient measures 11.3 mmHg. Aortic valve area, by VTI measures 2.52 cm. Pulmonic Valve: The pulmonic valve was not well visualized. Pulmonic valve regurgitation is not visualized. No evidence of  pulmonic stenosis. Aorta: The aortic root is normal in size and structure. Pulmonary Artery: 35. Venous: The inferior vena cava is normal in size with greater than 50% respiratory variability, suggesting right atrial pressure of 3 mmHg. IAS/Shunts: No atrial level shunt detected by color flow Doppler.  LEFT VENTRICLE PLAX 2D LVIDd:         4.00 cm  Diastology LVIDs:         1.90 cm  LV e' medial:    7.34 cm/s LV PW:         1.00 cm  LV E/e' medial:  11.9 LV IVS:        1.00 cm  LV e' lateral:   6.09 cm/s LVOT diam:     2.10 cm  LV E/e' lateral: 14.4 LV SV:         72 LV SV Index:   42 LVOT Area:     3.46 cm  RIGHT VENTRICLE RV S prime:     18.10 cm/s TAPSE (M-mode): 1.9 cm LEFT  ATRIUM             Index       RIGHT ATRIUM           Index LA diam:        2.60 cm 1.52 cm/m  RA Area:     12.40 cm LA Vol (A2C):   32.6 ml 19.03 ml/m RA Volume:   28.30 ml  16.52 ml/m LA Vol (A4C):   42.1 ml 24.57 ml/m LA Biplane Vol: 36.8 ml 21.48 ml/m  AORTIC VALVE AV Area (Vmax):    2.61 cm AV Area (Vmean):   2.72 cm AV Area (VTI):     2.52 cm AV Vmax:           168.33 cm/s AV Vmean:          109.711 cm/s AV VTI:            0.286 m AV Peak Grad:      11.3 mmHg AV Mean Grad:      5.4 mmHg LVOT Vmax:         127.00 cm/s LVOT Vmean:        86.300 cm/s LVOT VTI:          0.208 m LVOT/AV VTI ratio: 0.73  AORTA Ao Root diam: 3.00 cm Ao Asc diam:  3.00 cm MITRAL VALVE                TRICUSPID VALVE MV Area (PHT): 3.77 cm     TR Peak grad:   35.0 mmHg MV Decel Time: 201 msec     TR Vmax:        296.00 cm/s MV E velocity: 87.50 cm/s MV A velocity: 158.00 cm/s  SHUNTS MV E/A ratio:  0.55         Systemic VTI:  0.21 m                             Systemic Diam: 2.10 cm Carlyle Dolly MD Electronically signed by Carlyle Dolly MD Signature Date/Time: 08/28/2020/8:53:08 AM    Final    CT RENAL STONE STUDY  Result Date: 08/28/2020 CLINICAL DATA:  Flank pain and fevers EXAM: CT ABDOMEN AND PELVIS WITHOUT CONTRAST TECHNIQUE:  Multidetector CT imaging of the abdomen and pelvis was performed following the standard protocol without IV contrast. COMPARISON:  08/19/2020 FINDINGS: Lower chest: Lung bases again demonstrates some bronchiectatic changes in the right middle lobe and lingula similar to that seen on prior study. No new focal infiltrate is seen. Hepatobiliary: No focal liver abnormality is seen. No gallstones, gallbladder wall thickening, or biliary dilatation. Pancreas: Unremarkable. No pancreatic ductal dilatation or surrounding inflammatory changes. Spleen: Normal in size without focal abnormality. Adrenals/Urinary Tract: Adrenal glands are within normal limits. Kidneys are well visualized bilaterally. No renal calculi or obstructive changes are seen. Small cyst is noted in the upper pole of the right kidney. The bladder is well distended. Stomach/Bowel: The appendix is not well visualized and may have been surgically removed. No obstructive or inflammatory changes of the large or small bowel are seen. Stomach is decompressed. Vascular/Lymphatic: Aortic atherosclerosis. No enlarged abdominal or pelvic lymph nodes. Reproductive: Status post hysterectomy. No adnexal masses. Other: No abdominal wall hernia or abnormality. No abdominopelvic ascites. Musculoskeletal: No acute or significant osseous findings. IMPRESSION: Changes similar to that seen on prior exam. No acute abnormality is noted. Electronically Signed   By: Linus Mako.D.  On: 08/28/2020 09:15   VAS US CAROTID (at [facility] and [facility] only)  Result Date: 08/29/2020 Carotid Arterial Duplex Study Indications:  Recent fall with progressive decline of mental status. Risk Factors: Hypertension, hyperlipidemia, Diabetes. Performing Technologist: Rogelia Rohrer  Examination Guidelines: A complete evaluation includes B-mode imaging, spectral Doppler, color Doppler, and power Doppler as needed of all accessible portions of each vessel. Bilateral testing is considered an integral part of  a complete examination. Limited examinations for reoccurring indications may be performed as noted.  Right Carotid Findings: +----------+--------+--------+--------+------------------+--------+           PSV cm/sEDV cm/sStenosisPlaque DescriptionComments +----------+--------+--------+--------+------------------+--------+ CCA Prox  97      5                                          +----------+--------+--------+--------+------------------+--------+ CCA Distal122     28                                         +----------+--------+--------+--------+------------------+--------+ ICA Prox  87      23      1-39%                              +----------+--------+--------+--------+------------------+--------+ ECA       112     0                                          +----------+--------+--------+--------+------------------+--------+ +----------+--------+-------+----------------+-------------------+           PSV cm/sEDV cmsDescribe        Arm Pressure (mmHG) +----------+--------+-------+----------------+-------------------+ [id number]     0      Multiphasic, WNL                    +----------+--------+-------+----------------+-------------------+ +---------+--------+--+--------+--+---------+ VertebralPSV cm/s58EDV cm/s10Antegrade +---------+--------+--+--------+--+---------+  Left Carotid Findings: +----------+--------+--------+--------+------------------+--------+           PSV cm/sEDV cm/sStenosisPlaque DescriptionComments +----------+--------+--------+--------+------------------+--------+ CCA Prox  104     0                                          +----------+--------+--------+--------+------------------+--------+ CCA Distal100     12                                         +----------+--------+--------+--------+------------------+--------+ ICA Prox  53      12      1-39%                               +----------+--------+--------+--------+------------------+--------+ ICA Distal77      17                                         +----------+--------+--------+--------+------------------+--------+ ECA       63      0                                          +----------+--------+--------+--------+------------------+--------+ +----------+--------+--------+----------------+-------------------+  PSV cm/sEDV cm/sDescribe        Arm Pressure (mmHG) +----------+--------+--------+----------------+-------------------+ [id number]      0       Multiphasic, WNL                    +----------+--------+--------+----------------+-------------------+ +---------+--------+--+--------+--+---------+ VertebralPSV cm/s65EDV cm/s17Antegrade +---------+--------+--+--------+--+---------+   Summary: Right Carotid: Velocities in the right ICA are consistent with a 1-39% stenosis. Left Carotid: Velocities in the left ICA are consistent with a 1-39% stenosis. Vertebrals:  Bilateral vertebral arteries demonstrate antegrade flow. Subclavians: Normal flow hemodynamics were seen in bilateral subclavian              arteries. *See table(s) above for measurements and observations.  Electronically signed by Servando Snare MD on 08/29/2020 at 7:59:00 AM.    Final    VAS [place] LOWER EXTREMITY VENOUS (DVT)  Result Date: 09/03/2020  Lower Venous DVT Study Other Indications: CVA (cryptogenic). Performing Technologist: Rogelia Rohrer  Examination Guidelines: A complete evaluation includes B-mode imaging, spectral Doppler, color Doppler, and power Doppler as needed of all accessible portions of each vessel. Bilateral testing is considered an integral part of a complete examination. Limited examinations for reoccurring indications may be performed as noted. The reflux portion of the exam is performed with the patient in reverse Trendelenburg.  +---------+---------------+---------+-----------+----------+--------------+  RIGHT    CompressibilityPhasicitySpontaneityPropertiesThrombus Aging +---------+---------------+---------+-----------+----------+--------------+ CFV      Full           Yes      Yes                                 +---------+---------------+---------+-----------+----------+--------------+ SFJ      Full                                                        +---------+---------------+---------+-----------+----------+--------------+ FV Prox  Full           Yes      Yes                                 +---------+---------------+---------+-----------+----------+--------------+ FV Mid   Full           Yes      Yes                                 +---------+---------------+---------+-----------+----------+--------------+ FV DistalFull           Yes      Yes                                 +---------+---------------+---------+-----------+----------+--------------+ PFV      Full                                                        +---------+---------------+---------+-----------+----------+--------------+ POP      Full           Yes  Yes                                 +---------+---------------+---------+-----------+----------+--------------+ PTV      Full                                                        +---------+---------------+---------+-----------+----------+--------------+ PERO     Full                                                        +---------+---------------+---------+-----------+----------+--------------+   +---------+---------------+---------+-----------+----------+--------------+ LEFT     CompressibilityPhasicitySpontaneityPropertiesThrombus Aging +---------+---------------+---------+-----------+----------+--------------+ CFV      Full           Yes      Yes                                 +---------+---------------+---------+-----------+----------+--------------+ SFJ      Full                                                         +---------+---------------+---------+-----------+----------+--------------+ FV Prox  Full           Yes      Yes                                 +---------+---------------+---------+-----------+----------+--------------+ FV Mid   Full           Yes      Yes                                 +---------+---------------+---------+-----------+----------+--------------+ FV DistalFull           Yes      Yes                                 +---------+---------------+---------+-----------+----------+--------------+ PFV      Full                                                        +---------+---------------+---------+-----------+----------+--------------+ POP      Full           Yes      Yes                                 +---------+---------------+---------+-----------+----------+--------------+ PTV      Full                                                        +---------+---------------+---------+-----------+----------+--------------+  PERO     Full                                                        +---------+---------------+---------+-----------+----------+--------------+     Summary: RIGHT: - There is no evidence of deep vein thrombosis in the lower extremity.  - No cystic structure found in the popliteal fossa.  LEFT: - There is no evidence of deep vein thrombosis in the lower extremity.  - No cystic structure found in the popliteal fossa.  *See table(s) above for measurements and observations. Electronically signed by Servando Snare MD on 09/03/2020 at 8:20:27 PM.    Final    US Abdomen Limited RUQ (LIVER/GB)  Result Date: 08/28/2020 CLINICAL DATA:  Nausea and vomiting. EXAM: ULTRASOUND ABDOMEN LIMITED RIGHT UPPER QUADRANT COMPARISON:  CT scan 08/19/2020 FINDINGS: Gallbladder: No gallstones or gallbladder wall thickening. No pericholecystic fluid. The sonographer reports no sonographic Murphy's sign. Common bile duct: Diameter: 1-2  mm Liver: No focal lesion identified. Within normal limits in parenchymal echogenicity. Portal vein is patent on color Doppler imaging with normal direction of blood flow towards the liver. Other: None. IMPRESSION: No acute findings. No evidence to explain the patient's history of nausea and vomiting. Electronically Signed   By: Misty Stanley M.D.   On: 08/28/2020 08:04    Labs:  CBC: Recent Labs    09/04/20 2145 09/06/20 1008 09/08/20 0557 09/11/20 0213  WBC 1.7* 1.4* 1.6* 1.4*  HGB 9.2* 10.1* 10.0* 10.1*  HCT 26.0* 29.2* 29.7* 29.9*  PLT 116* 93* 82* 71*    COAGS: Recent Labs    09/11/20 0213  INR 2.2*    BMP: Recent Labs    10/23/19 1340 01/16/20 1447 02/13/20 1238 04/07/20 1557 07/01/20 1409 09/08/20 0557 09/09/20 0316 09/10/20 0249 09/11/20 0213  NA 136 137 137 137   < > 133* 136 138 140  K 3.9 3.8 4.0 4.5   < > 3.1* 3.3* 3.5 3.0*  CL 100 104 102 101   < > 97* 97* 102 105  CO2 _0 < > _1 GLUCOSE 142* 119* 137* 166*   < > 155* 175* 234* 192*  BUN _2 < > 19 24* 27* 33*  CALCIUM 9.0 8.6* 9.0 8.8*   < > 9.1 9.4 9.7 9.5  CREATININE 0.81 0.84 0.85 0.87   < > 0.67 0.71 0.77 0.76  GFRNONAA >60 >60 >60 >60   < > >60 >60 >60 >60  GFRAA >60 >60 >60 >60  --   --   --   --   --    < > = values in this interval not displayed.    LIVER FUNCTION TESTS: Recent Labs    08/17/2020 1814 09/04/20 0438 09/04/20 2145 09/06/20 1008  BILITOT 0.8 0.5 0.4 0.6  AST 11* 9* 11* 12*  ALT _3 ALKPHOS 58 65 65 62  PROT 6.8 5.4* 5.4* 5.6*  ALBUMIN 3.8 2.8* 2.9* 3.0*     Assessment and Plan:  History of acute metabolic encephalopathy/delirum with associated dysphagia (patient refusing oral intake at this time). Plan for image-guided percutaneous gastrostomy tube placement in IR tentatively for Wednesday 09/16/2020 pending IR scheduling and Plavix hold. Patient will be NPO at midnight prior to  procedure. Afebrile. Plavix held per IR  protocol (as long as LD 09/10/2020), will hold Lovenox per IR protocol. INR 2.2 today- must be 1.5 or less to proceed, Dr. Maylene Roes made aware. Will recheck AM of tentative procedure.  Risks and benefits discussed with the patient including, but not limited to the need for a barium enema during the procedure, bleeding, infection, peritonitis, or damage to adjacent structures. All of the patient's son's questions were answered, he is agreeable to proceed. Consent obtained by patient's son, Kjersti Dittmer, via telephone- signed and in IR control room.   Thank you for this interesting consult.  I greatly enjoyed meeting Amel Kitch and look forward to participating in their care.  A copy of this report was sent to the requesting provider on this date.  Electronically Signed: Earley Abide, PA-C 09/11/2020, 10:08 AM   I spent a total of 40 Minutes in face to face in clinical consultation, greater than 50% of which was counseling/coordinating care for dysphagia/percutaneous gastrostomy tube placement.

## 2020-09-11 NOTE — Progress Notes (Signed)
MD notified of the patient's MEWS score of 2; elevated pulse rate.

## 2020-09-12 ENCOUNTER — Encounter: Payer: Self-pay | Admitting: Internal Medicine

## 2020-09-12 ENCOUNTER — Inpatient Hospital Stay (HOSPITAL_COMMUNITY): Payer: Medicare PPO

## 2020-09-12 DIAGNOSIS — I749 Embolism and thrombosis of unspecified artery: Secondary | ICD-10-CM

## 2020-09-12 DIAGNOSIS — I469 Cardiac arrest, cause unspecified: Secondary | ICD-10-CM | POA: Diagnosis not present

## 2020-09-12 DIAGNOSIS — T17908A Unspecified foreign body in respiratory tract, part unspecified causing other injury, initial encounter: Secondary | ICD-10-CM | POA: Diagnosis not present

## 2020-09-12 DIAGNOSIS — G9340 Encephalopathy, unspecified: Secondary | ICD-10-CM | POA: Diagnosis not present

## 2020-09-12 LAB — GLUCOSE, CAPILLARY
Glucose-Capillary: 129 mg/dL — ABNORMAL HIGH (ref 70–99)
Glucose-Capillary: 236 mg/dL — ABNORMAL HIGH (ref 70–99)
Glucose-Capillary: 281 mg/dL — ABNORMAL HIGH (ref 70–99)
Glucose-Capillary: 288 mg/dL — ABNORMAL HIGH (ref 70–99)
Glucose-Capillary: 308 mg/dL — ABNORMAL HIGH (ref 70–99)

## 2020-09-12 LAB — PROTIME-INR
INR: 1.4 — ABNORMAL HIGH (ref 0.8–1.2)
Prothrombin Time: 16.9 seconds — ABNORMAL HIGH (ref 11.4–15.2)

## 2020-09-12 LAB — CBC
HCT: 31.3 % — ABNORMAL LOW (ref 36.0–46.0)
Hemoglobin: 10.4 g/dL — ABNORMAL LOW (ref 12.0–15.0)
MCH: 29.7 pg (ref 26.0–34.0)
MCHC: 33.2 g/dL (ref 30.0–36.0)
MCV: 89.4 fL (ref 80.0–100.0)
Platelets: 63 10*3/uL — ABNORMAL LOW (ref 150–400)
RBC: 3.5 MIL/uL — ABNORMAL LOW (ref 3.87–5.11)
RDW: 17.2 % — ABNORMAL HIGH (ref 11.5–15.5)
WBC: 1.7 10*3/uL — ABNORMAL LOW (ref 4.0–10.5)
nRBC: 0 % (ref 0.0–0.2)

## 2020-09-12 LAB — BASIC METABOLIC PANEL
Anion gap: 16 — ABNORMAL HIGH (ref 5–15)
BUN: 38 mg/dL — ABNORMAL HIGH (ref 8–23)
CO2: 17 mmol/L — ABNORMAL LOW (ref 22–32)
Calcium: 9.7 mg/dL (ref 8.9–10.3)
Chloride: 115 mmol/L — ABNORMAL HIGH (ref 98–111)
Creatinine, Ser: 0.97 mg/dL (ref 0.44–1.00)
GFR, Estimated: 58 mL/min — ABNORMAL LOW (ref >60)
Glucose, Bld: 298 mg/dL — ABNORMAL HIGH (ref 70–99)
Potassium: 3.6 mmol/L (ref 3.5–5.1)
Sodium: 148 mmol/L — ABNORMAL HIGH (ref 135–145)

## 2020-09-12 LAB — HEPATIC FUNCTION PANEL
ALT: 16 U/L (ref 0–44)
AST: 18 U/L (ref 15–41)
Albumin: 3 g/dL — ABNORMAL LOW (ref 3.5–5.0)
Alkaline Phosphatase: 84 U/L (ref 38–126)
Bilirubin, Direct: 0.3 mg/dL — ABNORMAL HIGH (ref 0.0–0.2)
Indirect Bilirubin: 0.7 mg/dL (ref 0.3–0.9)
Total Bilirubin: 1 mg/dL (ref 0.3–1.2)
Total Protein: 5.7 g/dL — ABNORMAL LOW (ref 6.5–8.1)

## 2020-09-12 LAB — MAGNESIUM: Magnesium: 1.6 mg/dL — ABNORMAL LOW (ref 1.7–2.4)

## 2020-09-12 LAB — PHOSPHORUS: Phosphorus: 2.6 mg/dL (ref 2.5–4.6)

## 2020-09-12 MED ORDER — LORAZEPAM 2 MG/ML IJ SOLN
1.0000 mg | Freq: Once | INTRAMUSCULAR | Status: AC
  Administered 2020-09-12: 1 mg via INTRAVENOUS
  Filled 2020-09-12: qty 1

## 2020-09-12 MED ORDER — PIPERACILLIN-TAZOBACTAM 3.375 G IVPB: 3.3750 g | Freq: Three times a day (TID) | INTRAVENOUS | Status: DC

## 2020-09-12 MED ORDER — PIPERACILLIN-TAZOBACTAM 3.375 G IVPB 30 MIN
3.3750 g | Freq: Once | INTRAVENOUS | Status: AC
  Administered 2020-09-12: 3.375 g via INTRAVENOUS
  Filled 2020-09-12 (×2): qty 50

## 2020-09-15 MED FILL — Medication: Qty: 1 | Status: AC

## 2020-10-06 NOTE — Progress Notes (Addendum)
   09/23/2020 1300  Clinical Encounter Type  Visited With Patient and family together  Visit Type Death  Referral From Nurse  Consult/Referral To Chaplain  Chaplain responded spoke with, Nurse Angela. She stated the patient passed away and her son needs a Chaplain.  Chaplain visited.  Grieving son, Bryan at bedside. Bryan stated it's difficult letting her go but knows she's in a better place. Chaplain prayed and provided ministry of presence. Chaplain also gave her son the patient placement card. Bryan stated all arrangements have been made and he will call Pt Placement to provide information. This note was prepared by Chaplain Gail Brown, M.Div..  For questions please contact by phone [phone].   

## 2020-10-06 NOTE — Procedures (Signed)
Intubation Procedure Note  Kara Griffin  [id number]  1936-07-28  Date:09-14-20  Time:1:02 PM   Provider Performing:Natalia Wittmeyer [name]    Procedure: Intubation (97416)  Indication(s) Respiratory Failure , responded to CODE BLUE, CPR in progress  Consent Unable to obtain consent due to emergent nature of procedure.   Anesthesia No meds , CPR in progress   Time Out Verified patient identification, verified procedure, site/side was marked, verified correct patient position, special equipment/implants available, medications/allergies/relevant history reviewed, required imaging and test results available.   Sterile Technique Usual hand hygeine, masks, and gloves were used   Procedure Description Patient positioned in bed supine.  Sedation given as noted above.  Patient was intubated with endotracheal tube using laryngoscope MAC 3.  View was Grade 1 full glottis .  Number of attempts was 1.  Colorimetric CO2 detector was consistent with tracheal placement.   Complications/Tolerance None, copious brown secretions suctioned out . Unfortunately pt could not be resuscitated    EBL Minimal   Specimen(s) None   Kara Griffin V. Kara Soho MD

## 2020-10-06 NOTE — Code Documentation (Signed)
  Patient Name: Kara Griffin   MRN: [id number]   Date of Birth/ Sex: 1935-12-21 , female      Admission Date: 08/18/2020  Attending Provider: Tonye Royalty, MD  Primary Diagnosis: Nausea & vomiting [R11.2] Fever [R50.9] Acute encephalopathy [G93.40] Altered mental status, unspecified altered mental status type [R41.82] Ischemic stroke [facility]) [I63.9]   Indication: Pt was in her usual state of health until this AM, when she was noted to be nonresponsive. Code blue was subsequently called. At the time of arrival on scene, ACLS protocol was underway.   Technical Description:  - CPR performance duration:  12 minutes  - Was defibrillation or cardioversion used? Yes   - Was external pacer placed? No  - Was patient intubated pre/post CPR? Yes   Medications Administered: Y = Yes; Blank = No Amiodarone    Atropine    Calcium    Epinephrine  Y  Lidocaine    Magnesium    Norepinephrine    Phenylephrine    Sodium bicarbonate  Y  Vasopressin    Other    Post CPR evaluation:  - Final Status - Was patient successfully resuscitated ? No   Miscellaneous Information:  - Time of death:  11-Dec-1217 PM  - Primary team notified?  Yes  - Family Notified? Yes     Maudie Mercury, MD   09/17/20, 12:42 PM

## 2020-10-06 NOTE — Progress Notes (Signed)
Pt was found unresponsive with no pulse or breaths. Code button pulled and CPR immediately started on the pt. Pt did pass away. Family was at bedside. Post Mortem care and orders are being completed.

## 2020-10-06 NOTE — Progress Notes (Signed)
Pharmacy Antibiotic Note  Kara Griffin is a 85 y.o. female admitted on Sep 21, 2020 with pneumonia and sepsis.  Pharmacy has been consulted for Zosyn dosing.   Pt was admitted for AMS and was found to have basal lacunar infarct. She was on Revlimid for MDS and it has been held here due to PO intake. Rapid response assessed pt for red MEWS score this AM. Empiric zosyn ordered aspiration PNA.  Scr <1 CrCl~40  Plan: Zosyn 3.375g IV x1 over 3minutes then q8 over 4 hrs  Weight: 69.1 kg (152 lb 5.4 oz)  Temp (24hrs), Avg:98.8 F (37.1 C), Min:97.5 F (36.4 C), Max:100.3 F (37.9 C)  Recent Labs  Lab 09/06/20 1008 09/08/20 0557 09/09/20 0316 09/10/20 0249 09/11/20 0213 09/28/2020 0443  WBC 1.4* 1.6*  --   --  1.4* 1.7*  CREATININE 0.64 0.67 0.71 0.77 0.76 0.97    Estimated Creatinine Clearance: 39.7 mL/min (by C-G formula based on SCr of 0.97 mg/dL).    Allergies  Allergen Reactions  . Ibuprofen     Lowers WBCs    Antimicrobials this admission: CTX 12/25 >>12/26 Vanc 12/24>>12/25 Cefepime 12/24>>12/25 Zosyn 1/8>> Dose adjustments this admission:   Microbiology results: 12/24 BCx: ngtd 12/31 Ucx: 40K yeast F

## 2020-10-06 NOTE — Progress Notes (Incomplete)
   09/09/2020 1300  Clinical Encounter Type  Visited With Patient and family together  Visit Type Death  Referral From Nurse  Consult/Referral To Chaplain  Chaplain responded spoke with, Nurse Levada Dy. She stated the patient passed away and her son needs a Clinical biochemist.  Chaplain visited.  Grieving son, Kara Griffin at bedside. Kara Griffin stated it's difficult letting her go but knows she's in a better place. Chaplain prayed and provided ministry of presence. Chaplain also gave her son the patient placement card. Kara Griffin stated all arrangements have been made and he will call Pt Placement to provide information. This note was prepared by Jeanine Luz, M.Div..  For questions please contact by phone [phone].

## 2020-10-06 NOTE — Progress Notes (Signed)
PT Cancellation & Discharge Note  Patient Details Name: Kara Griffin MRN: [id number] DOB: [birth year]   Cancelled Treatment:    Reason Eval/Treat Not Completed: Medical issues which prohibited therapy;Other (comment) (pt coded and passed away). Pt coded and passed away prior to session. No further PT services needed.  Moishe Spice, PT, DPT Acute Rehabilitation Services  Pager: [phone] Office: [place] 09/24/2020, 12:30 PM

## 2020-10-06 NOTE — Significant Event (Signed)
Rapid Response Event Note   Reason for Call :  MEWS call  Initial Focused Assessment:  While rounding on the unit the charge RN asked me to lay eyes on this patient because of her Red MEWS score.  This patient was resting in bed when I walked into the room.  She had audible rhonchi bilaterally on ausculation.  The patient has a weak cough and is not clearing her secretions very well.  The MD at bedside expressed her concerns that the patient has aspirated.  The patient has a poor gag reflex.  BP 179/61 Temp 100.3 rectal ECG 91 O2 96% RR 26     Interventions:  Rectal temp obtained, NT suctioned patient with minimal secretions, tube feeding stopped and flushed   Plan of Care:  Patient is receiving a chest xray, blood culture ordered, broad spectrum antibiotics ordered (by MD)   Event Summary:   MD Notified: Dr. Rockne Menghini Call Time: was rounding on the unit Arrival Time: End Time:0905  Venetia Maxon, RN

## 2020-10-06 NOTE — Death Summary Note (Addendum)
DEATH SUMMARY   Patient Details  Name: Kara Griffin MRN: [id number] DOB: 05-13-1936  Admission/Discharge Information   Admit Date:  2020-09-04  Date of Death: Date of Death: 09-20-2020  Time of Death: Time of Death: 12-20-17  Length of Stay: December 27, 2022  Referring Physician: Einar Pheasant, MD   Reason(s) for Hospitalization    Acute Stroke complicated by dysphagia and encephalopathy  Diagnoses  Preliminary cause of death: Aspiration in the setting of dysphagia from CVA  Secondary Diagnoses (including complications and co-morbidities):    Cerebral thrombosis with cerebral infarction   Myelodysplastic syndrome ([place]) on chronic Revlimid   Diabetes mellitus ([place]), 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 year old female  with a PMH of MDS, DM2, HTN who was admitted 09-04-20 with acute basal ganglia lacunar infarct secondary to small vessel disease. Noted to have acute encephalopathy on admission, with a decreased level of orientation over typical baseline. She completed a stroke work-up. MRA brain was negative for large vessel obstruction, + for moderate L A1 stenosis. Carotid Doppler ultrasound unremarkable. 2D echo EF 75%, no source of embolus. EEG suggestive of mild diffuse encephalopathy, nonspecific etiology. No seizures or epileptiform discharges were seen throughout the recording. LDL 35. Hemoglobin A1c 6.0. Seen by neurology/stroke team with  recommendation for aspirin 81 mg daily and Plavix 75 mg daily for 3 weeks followed by aspirin alone. PT/OT recommended SNF.  MRI brain was done on 09/02/2020 which showednew 5-7 punctate foci of acute infarctions scattered within the inferior cerebellum on both sides, suggestive of embolic disease from the heart or ascending aorta.Cardiology was contacted for arrangement of 30-day cardiac event monitoring outpatient.  She had a CT a head and neck completed which was unremarkable. Bilateral lower extremity venous Doppler ultrasound was negative for DVT. Neurology subsequently signed off.   Hospital course further complicated by delirium with waxing and waning altered mental status.Her sons strongly believef that she had a UTI. Urine culture wasnegative. She was given additional 3 days of antibiotics. Antibiotics were stopped on 09/05/2020.CT head was done which was negative for any acute intracranial findings. Patient was worked up for secondary sources of delirium which was negative. Patient was seen by Neurology on 09/08/19 who noted no further work up indicated and patient would benefit from strict delirium measures. Also of concern was patient's inability to take Revlimid orally for her MDS. Hematology/Oncology saw patient and recommended holding Revlimid until patient could take it orally.  Due to continued refusal of food and medication, family elected for PEG placement but needed to await Plavix wash out before this could be done safely, so she had a Coretrak in place for enteral nutrition. On the morning of 09-20-2020, the patient was flushed in appearance with a rise in her MEWS score to 4.  She was tachypneic, tachycardic and appeared to be in some respiratory distress. Aspiration was suspected. TF was stopped and rapid response RN performed nasotracheal suction. Rectal temperature was elevated and she was empirically started on  Zosyn while awaiting STAT CXR results, which was subsequently  found to be negative for infiltrates. BP was stable at that time. Around 12 pm, patient had a cardiac arrest/asystole on monitor. Code Blue called CPR initiated with multiple rounds of epinephrine given by code team. Attempted defibrillation x 2. PCCM responded and performed ET intubation, noting copious brown secretions. Despite attempts to resuscitate her, she failed to regain a pulse and she was pronounced deceased at 12:19 pm. Son updated and provided with emotional support.    Pertinent Labs and Studies  Significant Diagnostic Studies CT ANGIO HEAD W OR WO CONTRAST  Result Date: 09/02/2020 CLINICAL DATA:  Stroke/TIA. Assess intracranial and extracranial arteries. EXAM: CT ANGIOGRAPHY HEAD AND NECK TECHNIQUE: Multidetector CT imaging of the head and neck was performed using the standard protocol during bolus administration of intravenous contrast. Multiplanar CT image reconstructions and MIPs were obtained to evaluate the vascular anatomy. Carotid stenosis measurements (when applicable) are obtained utilizing NASCET criteria, using the distal internal carotid diameter as the denominator. CONTRAST:  5mL OMNIPAQUE IOHEXOL 350 MG/ML SOLN COMPARISON:  MRI of the brain September 03, 2019. FINDINGS: CT HEAD FINDINGS Brain: No evidence of acute infarction, hemorrhage, hydrocephalus, extra-axial collection or mass lesion/mass effect. Acute small infarcts are better demonstrated on recent MRI of the brain performed earlier today. Vascular: Calcified plaques in the bilateral carotid siphons and bilateral vertebral arteries. Skull: Normal. Negative for fracture or focal lesion. Sinuses: Imaged portions are clear. Orbits: No acute finding. Review of the MIP images confirms the above findings CTA NECK FINDINGS Aortic arch: Standard branching. Imaged portion shows no evidence of aneurysm or dissection. No significant stenosis of the major arch vessel origins. Right carotid system: Increased tortuosity of the  innominate and proximal right common carotid artery and distal cervical segment of the right ICA. Mild atherosclerotic changes in the right carotid bifurcation. No evidence of dissection, stenosis (50% or greater) or occlusion. Left carotid system: Mild luminal irregularity cervical segment of the left ICA, likely related to mild atherosclerotic disease without stenosis. There is also increased tortuosity of the upper cervical segment of the left ICA. No evidence of dissection, stenosis (50% or greater) or occlusion. Vertebral arteries: The left vertebral artery is dominant. No evidence of dissection, stenosis (50% or greater) or occlusion. Skeleton: Degenerative changes of the cervical. No aggressive bone lesion seen. Other neck: Heterogeneous left thyroid lobe nodule measuring 1.2 cm. Upper chest: Biapical scarring and ground-glass opacities. Review of the MIP images confirms the above findings CTA HEAD FINDINGS Anterior circulation: No significant stenosis, proximal occlusion, aneurysm, or vascular malformation. Posterior circulation: Minutes if caliber of the V4 segment of the right vertebral artery with a dominant left vertebral artery. No significant stenosis, proximal occlusion, aneurysm, or vascular malformation. Venous sinuses: As permitted by contrast timing, patent. Anatomic variants: None. Review of the MIP images confirms the above findings IMPRESSION: 1. No large vessel occlusion, hemodynamically significant stenosis, or evidence of dissection. 2. Heterogeneous left thyroid lobe nodule measuring 1.2 cm. Electronically Signed   By: Pedro Earls M.D.   On: 09/02/2020 18:17   DG Chest 1 View  Result Date: 08/19/2020 CLINICAL DATA:  Weakness with increased cough. Fall today. EXAM: CHEST  1 VIEW COMPARISON:  Radiograph 01/26/2016 FINDINGS: Heart is normal in size. Unchanged mediastinal contours. There is chronic hyperinflation. Bronchial thickening appears chronic but increased from  prior exam. Subsegmental opacities in both mid lung zones and left lung base. Chronic biapical pleuroparenchymal scarring. No significant pleural effusion.  No pneumothorax. No acute osseous abnormalities are seen. IMPRESSION: 1. Subsegmental opacities in both mid lung zones and left lung base, favor atelectasis or scarring, however atypical pneumonia could have a similar appearance. 2. Chronic but increased bronchial thickening may represent acute bronchitis. Chronic hyperinflation. Electronically Signed   By: Keith Rake M.D.   On: 08/19/2020 15:23   DG Lumbar Spine 2-3 Views  Result Date: 08/19/2020 CLINICAL DATA:  Weakness and cough. Fall today. EXAM: LUMBAR SPINE - 2-3 VIEW COMPARISON:  Lumbar radiograph 07/16/2018 FINDINGS: Slight dextroscoliotic curvature unchanged from prior exam. No listhesis. The vertebral body heights are preserved. Disc space narrowing and endplate spurring at [id number], L4-L5, and L5-S1. There is lower lumbar facet hypertrophy. No acute fracture. The sacroiliac joints are congruent. IMPRESSION: 1. No acute fracture or subluxation of the lumbar spine. 2. Mild scoliosis with stable degenerative change from 2019. Electronically Signed   By: Keith Rake M.D.   On: 08/19/2020 15:25   DG Abd 1 View  Result Date: 08/29/2020 CLINICAL DATA:  Abdominal pain and bloating. EXAM: ABDOMEN - 1 VIEW COMPARISON:  None. FINDINGS: The bowel gas pattern is normal, with large amount of air seen throughout the colon. No radio-opaque calculi or other significant radiographic abnormality are seen. Subcentimeter phleboliths are noted within lower pelvis. IMPRESSION: Negative. Electronically Signed   By: Virgina Norfolk M.D.   On: 08/29/2020 22:54   CT HEAD WO CONTRAST  Result Date: 09/04/2020 CLINICAL DATA:  Cerebral infarction, follow-up examination EXAM: CT HEAD WITHOUT CONTRAST TECHNIQUE: Contiguous axial images were obtained from the base of the skull through the vertex without  intravenous contrast. COMPARISON:  MRI 09/02/2020 FINDINGS: Brain: Normal anatomic configuration. Parenchymal volume loss is commensurate with the patient's age. Mild periventricular white matter changes are present likely reflecting the sequela of small vessel ischemia. The known punctate infarcts within the right putamen and cerebellum are not well appreciated on this examination. No abnormal intra or extra-axial mass lesion or fluid collection. No abnormal mass effect or midline shift. No evidence of acute intracranial hemorrhage or infarct. Ventricular size is normal. Cerebellum unremarkable. Vascular: No asymmetric hyperdense vasculature at the skull base. Skull: Intact Sinuses/Orbits: Paranasal sinuses are clear. Orbits are unremarkable. Other: Mastoid air cells and middle ear cavities are clear. IMPRESSION: Known punctate infarcts within the right putamen and cerebellum noted on prior MRI examination are not well appreciated on this examination. No definite evidence of acute intracranial hemorrhage or infarct. Electronically Signed   By: Fidela Salisbury MD   On: 09/04/2020 21:10   CT Head Wo Contrast  Result Date: 08/19/2020 CLINICAL DATA:  85 year old female with history of minor head trauma from a fall today. EXAM: CT HEAD WITHOUT CONTRAST CT CERVICAL SPINE WITHOUT CONTRAST TECHNIQUE: Multidetector CT imaging of the head and cervical spine was performed following the standard protocol without intravenous contrast. Multiplanar CT image reconstructions of the cervical spine were also generated. COMPARISON:  None. No priors. FINDINGS: CT HEAD FINDINGS Brain: Mild cerebral atrophy. Patchy and confluent areas of decreased attenuation are noted throughout the deep and periventricular white matter of the cerebral hemispheres bilaterally, compatible with chronic microvascular ischemic disease. No evidence of acute infarction, hemorrhage, hydrocephalus, extra-axial collection or mass lesion/mass effect.  Vascular: No hyperdense vessel or unexpected calcification. Skull: Normal. Negative for fracture or focal lesion. Sinuses/Orbits: No acute finding. Other: None. CT CERVICAL SPINE FINDINGS Alignment: Normal. Skull base and vertebrae: No acute fracture. No primary bone lesion or focal pathologic process. Soft tissues and spinal canal:  No prevertebral fluid or swelling. No visible canal hematoma. Disc levels: Very mild multilevel degenerative disc disease and facet arthropathy. Upper chest: Bilateral apical pleuroparenchymal thickening and architectural distortion, most compatible with areas of chronic post infectious or inflammatory scarring. Other: None. IMPRESSION: 1. No evidence of significant acute traumatic injury to the skull, brain or cervical spine. 2. Mild cerebral atrophy with chronic microvascular ischemic changes in the cerebral white matter. 3. Very mild multilevel degenerative disc disease and facet arthropathy. Electronically Signed   By: Vinnie Langton M.D.   On: 08/19/2020 18:42   CT ANGIO NECK W OR WO CONTRAST  Result Date: 09/02/2020 CLINICAL DATA:  Stroke/TIA. Assess intracranial and extracranial arteries. EXAM: CT ANGIOGRAPHY HEAD AND NECK TECHNIQUE: Multidetector CT imaging of the head and neck was performed using the standard protocol during bolus administration of intravenous contrast. Multiplanar CT image reconstructions and MIPs were obtained to evaluate the vascular anatomy. Carotid stenosis measurements (when applicable) are obtained utilizing NASCET criteria, using the distal internal carotid diameter as the denominator. CONTRAST:  40mL OMNIPAQUE IOHEXOL 350 MG/ML SOLN COMPARISON:  MRI of the brain September 03, 2019. FINDINGS: CT HEAD FINDINGS Brain: No evidence of acute infarction, hemorrhage, hydrocephalus, extra-axial collection or mass lesion/mass effect. Acute small infarcts are better demonstrated on recent MRI of the brain performed earlier today. Vascular: Calcified plaques  in the bilateral carotid siphons and bilateral vertebral arteries. Skull: Normal. Negative for fracture or focal lesion. Sinuses: Imaged portions are clear. Orbits: No acute finding. Review of the MIP images confirms the above findings CTA NECK FINDINGS Aortic arch: Standard branching. Imaged portion shows no evidence of aneurysm or dissection. No significant stenosis of the major arch vessel origins. Right carotid system: Increased tortuosity of the innominate and proximal right common carotid artery and distal cervical segment of the right ICA. Mild atherosclerotic changes in the right carotid bifurcation. No evidence of dissection, stenosis (50% or greater) or occlusion. Left carotid system: Mild luminal irregularity cervical segment of the left ICA, likely related to mild atherosclerotic disease without stenosis. There is also increased tortuosity of the upper cervical segment of the left ICA. No evidence of dissection, stenosis (50% or greater) or occlusion. Vertebral arteries: The left vertebral artery is dominant. No evidence of dissection, stenosis (50% or greater) or occlusion. Skeleton: Degenerative changes of the cervical. No aggressive bone lesion seen. Other neck: Heterogeneous left thyroid lobe nodule measuring 1.2 cm. Upper chest: Biapical scarring and ground-glass opacities. Review of the MIP images confirms the above findings CTA HEAD FINDINGS Anterior circulation: No significant stenosis, proximal occlusion, aneurysm, or vascular malformation. Posterior circulation: Minutes if caliber of the V4 segment of the right vertebral artery with a dominant left vertebral artery. No significant stenosis, proximal occlusion, aneurysm, or vascular malformation. Venous sinuses: As permitted by contrast timing, patent. Anatomic variants: None. Review of the MIP images confirms the above findings IMPRESSION: 1. No large vessel occlusion, hemodynamically significant stenosis, or evidence of dissection. 2.  Heterogeneous left thyroid lobe nodule measuring 1.2 cm. Electronically Signed   By: Pedro Earls M.D.   On: 09/02/2020 18:17   CT Cervical Spine Wo Contrast  Result Date: 08/19/2020 CLINICAL DATA:  85 year old female with history of minor head trauma from a fall today. EXAM: CT HEAD WITHOUT CONTRAST CT CERVICAL SPINE WITHOUT CONTRAST TECHNIQUE: Multidetector CT imaging of the head and cervical spine was performed following the standard protocol without intravenous contrast. Multiplanar CT image reconstructions of the cervical spine were also generated. COMPARISON:  None. No priors. FINDINGS: CT HEAD FINDINGS Brain: Mild cerebral atrophy. Patchy and confluent areas of decreased attenuation are noted throughout the deep and periventricular white matter of the cerebral hemispheres bilaterally, compatible with chronic microvascular ischemic disease. No evidence of acute infarction, hemorrhage, hydrocephalus, extra-axial collection or mass lesion/mass effect. Vascular: No hyperdense vessel or unexpected calcification. Skull: Normal. Negative for fracture or focal lesion. Sinuses/Orbits: No acute finding. Other: None. CT CERVICAL SPINE FINDINGS Alignment: Normal. Skull base and vertebrae: No acute fracture. No primary bone lesion or focal pathologic process. Soft tissues and spinal canal: No prevertebral fluid or swelling. No visible canal hematoma. Disc levels: Very mild multilevel degenerative disc disease and facet arthropathy. Upper chest: Bilateral apical pleuroparenchymal thickening and architectural distortion, most compatible with areas of chronic post infectious or inflammatory scarring. Other: None. IMPRESSION: 1. No evidence of significant acute traumatic injury to the skull, brain or cervical spine. 2. Mild cerebral atrophy with chronic microvascular ischemic changes in the cerebral white matter. 3. Very mild multilevel degenerative disc disease and facet arthropathy. Electronically  Signed   By: Vinnie Langton M.D.   On: 08/19/2020 18:42   MR ANGIO HEAD WO CONTRAST  Result Date: 08/28/2020 CLINICAL DATA:  Stroke follow-up. Acute right basal ganglia infarct on earlier MRI. EXAM: MRA HEAD WITHOUT CONTRAST TECHNIQUE: Angiographic images of the Circle of Willis were obtained using MRA technique without intravenous contrast. COMPARISON:  None. FINDINGS: The visualized distal vertebral arteries are widely patent to the basilar with the left being strongly dominant. Patent AICA and SCA origins are identified bilaterally. The basilar artery is widely patent. Posterior communicating arteries are not identified and may be diminutive or absent. The PCAs are patent without evidence of a significant proximal stenosis. There are distal cervical internal carotid artery loops bilaterally. The intracranial internal carotid arteries are patent without evidence of significant stenosis allowing for motion artifact through the anterior genu of the left ICA. ACAs and MCAs are patent without evidence of a proximal branch occlusion or significant M1 stenosis. Assessment for branch vessel stenosis is limited by motion. There is a moderate stenosis versus motion artifact in the distal left A1 segment. No aneurysm is identified. IMPRESSION: 1. No large vessel occlusion. 2. Moderate left A1 stenosis versus motion artifact. Electronically Signed   By: Logan Bores M.D.   On: 08/28/2020 03:11   MR BRAIN WO CONTRAST  Result Date: 09/02/2020 CLINICAL DATA:  Myelodysplastic syndrome. Acute presentation with confusion. Weakness. EXAM: MRI HEAD WITHOUT CONTRAST TECHNIQUE: Multiplanar, multiecho pulse sequences of the brain and surrounding structures were obtained without intravenous contrast. COMPARISON:  08/28/2020 FINDINGS: Brain: Subcentimeter acute infarction within the putamen on the right is unchanged since the prior exam. No new supratentorial finding. Newly seen today are 5-7 punctate foci of acute  infarctions scattered within the inferior cerebellum on both sides. No large confluent infarction. Elsewhere, chronic small-vessel ischemic changes are seen within the deep and subcortical white matter of both cerebral hemispheres. No large vessel territory infarction. No mass, hemorrhage, hydrocephalus or extra-axial collection. Vascular: Major vessels at the base of the brain show flow. Skull and upper cervical spine: Negative Sinuses/Orbits: Clear/normal Other: None IMPRESSION: 1. Unchanged subcentimeter acute infarction within the putamen on the right. 2. Newly seen 5-7 punctate foci of acute infarctions scattered within the inferior cerebellum on both sides. No large confluent infarction. Being a different vascular territory, this suggests embolic disease from the heart or ascending aorta. 3. Chronic small-vessel ischemic changes elsewhere throughout the brain as outlined  above. 4. Findings discussed with hospitalist physician at the time of interpretation. Electronically Signed   By: Nelson Chimes M.D.   On: 09/02/2020 14:31   MR BRAIN WO CONTRAST  Result Date: 08/28/2020 CLINICAL DATA:  Dizziness.  Memory loss. EXAM: MRI HEAD WITHOUT CONTRAST TECHNIQUE: Multiplanar, multiecho pulse sequences of the brain and surrounding structures were obtained without intravenous contrast. COMPARISON:  Head CT 08/19/2020 FINDINGS: Brain: There is a 4 mm acute infarct at the anteroinferior aspect of the right basal ganglia. Small T2 hyperintensities scattered throughout the cerebral white matter bilaterally are nonspecific but compatible with mild chronic small vessel ischemic disease. Two small adjacent chronic hemorrhages are noted in the subcortical white matter of the left frontal lobe. Mild cerebral atrophy is within normal limits for age. Vascular: Major intracranial vascular flow voids are preserved. Skull and upper cervical spine: Unremarkable bone marrow signal. Sinuses/Orbits: Bilateral cataract extraction.  Paranasal sinuses and mastoid air cells are clear. Other: None. IMPRESSION: 1. Acute right basal ganglia lacunar infarct. 2. Mild chronic small vessel ischemic disease. Electronically Signed   By: Logan Bores M.D.   On: 08/28/2020 01:18   CT ABDOMEN PELVIS W CONTRAST  Result Date: 08/19/2020 CLINICAL DATA:  Right lower quadrant abdominal pain. Fall at home. Dizziness. EXAM: CT ABDOMEN AND PELVIS WITH CONTRAST TECHNIQUE: Multidetector CT imaging of the abdomen and pelvis was performed using the standard protocol following bolus administration of intravenous contrast. CONTRAST:  148mL OMNIPAQUE IOHEXOL 300 MG/ML  SOLN COMPARISON:  None. FINDINGS: Lower chest: Right middle lobe bronchiectasis with distal mucous plugging. Reticulonodular opacities in the lingula. Subsegmental opacities in the left lower lobe likely atelectasis. No pleural fluid. No pericardial fluid. No basilar rib fracture. Hepatobiliary: Elongated right lobe of the liver. No focal hepatic abnormality. No evidence of perihepatic hematoma or hepatic injury. Distended gallbladder without pericholecystic inflammation or calcified gallstone. No biliary dilatation. Pancreas: No ductal dilatation or inflammation. Spleen: Subcentimeter hypodensity in the anterior spleen is nonspecific, typically benign. No splenomegaly. No splenic injury or perisplenic hematoma. Adrenals/Urinary Tract: No renal or adrenal injury. There is a 14 mm left adrenal nodule. Lobulated bilateral renal contours. Bilateral cortical cysts. Symmetric prominence of both renal pelvis likely extrarenal pelvis configuration. No perinephric edema. No renal calculi. The urinary bladder is distended. No bladder wall thickening or injury. Stomach/Bowel: Tiny hiatal hernia. The stomach is nondistended. No bowel obstruction, inflammation, or evidence of injury. Sigmoid colon is redundant. Small volume of colonic stool. The appendix is not confidently visualized. Vascular/Lymphatic: Aortic  atherosclerosis. Patent portal vein. No acute vascular findings or evidence of injury. No adenopathy. Reproductive: Status post hysterectomy. No adnexal masses. Other: No free air or free fluid. Postsurgical changes the anterior abdominal wall. No body wall hernia. Musculoskeletal: No acute fracture of the pelvis, lumbar spine, or included lower ribs. Multilevel degenerative change with vacuum phenomena and facet hypertrophy in the lumbar spine. IMPRESSION: 1. The appendix is not visualized, there is no evidence of appendicitis. 2. No acute traumatic injury to the abdomen or pelvis. 3. Distended gallbladder without pericholecystic inflammation or calcified gallstone. If there is clinical concern for acute gallbladder pathology, recommend right upper quadrant ultrasound. 4. Right middle lobe bronchiectasis with distal mucous plugging. Reticulonodular opacities in the lingula. Findings suggest prior mycobacterium avium infection. 5. Left adrenal nodule measuring 14 mm, nonspecific. In the absence of known malignancy, this is likely benign. Aortic Atherosclerosis (ICD10-I70.0). Electronically Signed   By: Keith Rake M.D.   On: 08/19/2020 18:43   DG Pelvis  Portable  Result Date: 08/19/2020 CLINICAL DATA:  Weakness. Fall today. EXAM: PORTABLE PELVIS 1-2 VIEWS COMPARISON:  Pelvis and right hip radiograph on 07/16/2018 FINDINGS: The cortical margins of the bony pelvis are intact. No fracture. Pubic symphysis and sacroiliac joints are congruent. Bilateral acetabular spurring. Both femoral heads are well-seated in the respective acetabula. IMPRESSION: No pelvic fracture. Electronically Signed   By: Keith Rake M.D.   On: 08/19/2020 15:26   DG CHEST PORT 1 VIEW  Result Date: 28-Sep-2020 CLINICAL DATA:  Tachypnea.  Possible aspiration. EXAM: PORTABLE CHEST 1 VIEW COMPARISON:  08/28/2020 FINDINGS: A feeding tube is now seen entering the stomach. Heart size remains normal. Aortic atherosclerotic calcification  noted. Previously seen opacity in the lateral left lung base is no longer visualized. No evidence of acute infiltrate or pleural effusion. : New feeding tube is seen entering the stomach. No active lung disease. Electronically Signed   By: Marlaine Hind M.D.   On: 09/28/2020 09:51   DG CHEST PORT 1 VIEW  Result Date: 08/28/2020 CLINICAL DATA:  Fever EXAM: PORTABLE CHEST 1 VIEW COMPARISON:  08/19/2020 FINDINGS: Borderline heart size. Blunting at the lateral left costophrenic sulcus that is stable and most likely from mediastinal fat. Reticulation of lung markings. There is signs of chronic indolent infection/scarring in the lower lungs on recent abdominal CT. There is no edema, consolidation, effusion, or pneumothorax. IMPRESSION: Stable from prior.  No acute finding. Electronically Signed   By: Monte Fantasia M.D.   On: 08/28/2020 07:05   EEG adult  Result Date: 08/28/2020 Lora Havens, MD     08/28/2020 12:15 PM Patient Name: Malorey Hirshman MRN: [id number] Epilepsy Attending: Lora Havens Referring Physician/Provider: Dr Antony Contras Date: 08/28/2020 Duration: 24.43 mins Patient history: 85yo F with R basal ganglia infarct and cognitive impairment. EEG to evaluate for seizure. Level of alertness: Awake AEDs during EEG study: None Technical aspects: This EEG study was done with scalp electrodes positioned according to the 10-20 International system of electrode placement. Electrical activity was acquired at a sampling rate of 500Hz  and reviewed with a high frequency filter of 70Hz  and a low frequency filter of 1Hz . EEG data were recorded continuously and digitally stored. Description: The posterior dominant rhythm consists of 8 Hz activity of moderate voltage (25-35 uV) seen predominantly in posterior head regions, symmetric and reactive to eye opening and eye closing. EEG showed intermittent generalized 3 to 6 Hz theta-delta slowing.  Hyperventilation and photic stimulation were not  performed.   ABNORMALITY -Intermittent slow, generalized IMPRESSION: This study is suggestive of mild diffuse encephalopathy, nonspecific etiology. No seizures or epileptiform discharges were seen throughout the recording. Lora Havens   ECHOCARDIOGRAM COMPLETE  Result Date: 08/28/2020    ECHOCARDIOGRAM REPORT   Patient Name:   Benson Setting Date of Exam: 08/28/2020 Medical Rec #:  [id number]              Height:       61.0 in Accession #:    [id number]             Weight:       159.0 lb Date of Birth:  09-07-1935              BSA:          1.713 m Patient Age:    29 years               BP:  118/49 mmHg Patient Gender: F                      HR:           88 bpm. Exam Location:  Inpatient Procedure: 2D Echo, Cardiac Doppler and Color Doppler Indications:    Stroke 434.91 / I163.9  History:        Patient has no prior history of Echocardiogram examinations.                 Risk Factors:Hypertension and Diabetes.  Sonographer:    Bernadene Person RDCS Referring Phys: [place]  1. Left ventricular ejection fraction, by estimation, is >75%. The left ventricle has normal function. The left ventricle has no regional wall motion abnormalities. Left ventricular diastolic parameters are consistent with Grade I diastolic dysfunction (impaired relaxation).  2. Right ventricular systolic function is normal. The right ventricular size is normal. There is mildly elevated pulmonary artery systolic pressure.  3. The mitral valve is normal in structure. No evidence of mitral valve regurgitation. No evidence of mitral stenosis.  4. The aortic valve is tricuspid. There is mild calcification of the aortic valve. There is mild thickening of the aortic valve. Aortic valve regurgitation is not visualized. No aortic stenosis is present.  5. The inferior vena cava is normal in size with greater than 50% respiratory variability, suggesting right atrial pressure of 3 mmHg. FINDINGS  Left  Ventricle: Left ventricular ejection fraction, by estimation, is >75%. The left ventricle has normal function. The left ventricle has no regional wall motion abnormalities. The left ventricular internal cavity size was normal in size. There is no left ventricular hypertrophy. Left ventricular diastolic parameters are consistent with Grade I diastolic dysfunction (impaired relaxation). Normal left ventricular filling pressure. Right Ventricle: The right ventricular size is normal. No increase in right ventricular wall thickness. Right ventricular systolic function is normal. There is mildly elevated pulmonary artery systolic pressure. The tricuspid regurgitant velocity is 2.96  m/s, and with an assumed right atrial pressure of 3 mmHg, the estimated right ventricular systolic pressure is [id number] mmHg. Left Atrium: Left atrial size was normal in size. Right Atrium: Right atrial size was normal in size. Pericardium: There is no evidence of pericardial effusion. Mitral Valve: The mitral valve is normal in structure. No evidence of mitral valve regurgitation. No evidence of mitral valve stenosis. Tricuspid Valve: The tricuspid valve is normal in structure. Tricuspid valve regurgitation is mild . No evidence of tricuspid stenosis. Aortic Valve: The aortic valve is tricuspid. There is mild calcification of the aortic valve. There is mild thickening of the aortic valve. There is mild aortic valve annular calcification. Aortic valve regurgitation is not visualized. No aortic stenosis  is present. Aortic valve mean gradient measures 5.4 mmHg. Aortic valve peak gradient measures 11.3 mmHg. Aortic valve area, by VTI measures 2.52 cm. Pulmonic Valve: The pulmonic valve was not well visualized. Pulmonic valve regurgitation is not visualized. No evidence of pulmonic stenosis. Aorta: The aortic root is normal in size and structure. Pulmonary Artery: 35. Venous: The inferior vena cava is normal in size with greater than 50% respiratory  variability, suggesting right atrial pressure of 3 mmHg. IAS/Shunts: No atrial level shunt detected by color flow Doppler.  LEFT VENTRICLE PLAX 2D LVIDd:         4.00 cm  Diastology LVIDs:         1.90 cm  LV e' medial:    7.34  cm/s LV PW:         1.00 cm  LV E/e' medial:  11.9 LV IVS:        1.00 cm  LV e' lateral:   6.09 cm/s LVOT diam:     2.10 cm  LV E/e' lateral: 14.4 LV SV:         72 LV SV Index:   42 LVOT Area:     3.46 cm  RIGHT VENTRICLE RV S prime:     18.10 cm/s TAPSE (M-mode): 1.9 cm LEFT ATRIUM             Index       RIGHT ATRIUM           Index LA diam:        2.60 cm 1.52 cm/m  RA Area:     12.40 cm LA Vol (A2C):   32.6 ml 19.03 ml/m RA Volume:   28.30 ml  16.52 ml/m LA Vol (A4C):   42.1 ml 24.57 ml/m LA Biplane Vol: 36.8 ml 21.48 ml/m  AORTIC VALVE AV Area (Vmax):    2.61 cm AV Area (Vmean):   2.72 cm AV Area (VTI):     2.52 cm AV Vmax:           168.33 cm/s AV Vmean:          109.711 cm/s AV VTI:            0.286 m AV Peak Grad:      11.3 mmHg AV Mean Grad:      5.4 mmHg LVOT Vmax:         127.00 cm/s LVOT Vmean:        86.300 cm/s LVOT VTI:          0.208 m LVOT/AV VTI ratio: 0.73  AORTA Ao Root diam: 3.00 cm Ao Asc diam:  3.00 cm MITRAL VALVE                TRICUSPID VALVE MV Area (PHT): 3.77 cm     TR Peak grad:   35.0 mmHg MV Decel Time: 201 msec     TR Vmax:        296.00 cm/s MV E velocity: 87.50 cm/s MV A velocity: 158.00 cm/s  SHUNTS MV E/A ratio:  0.55         Systemic VTI:  0.21 m                             Systemic Diam: 2.10 cm Carlyle Dolly MD Electronically signed by Carlyle Dolly MD Signature Date/Time: 08/28/2020/8:53:08 AM    Final    CT RENAL STONE STUDY  Result Date: 08/28/2020 CLINICAL DATA:  Flank pain and fevers EXAM: CT ABDOMEN AND PELVIS WITHOUT CONTRAST TECHNIQUE: Multidetector CT imaging of the abdomen and pelvis was performed following the standard protocol without IV contrast. COMPARISON:  08/19/2020 FINDINGS: Lower chest: Lung bases again  demonstrates some bronchiectatic changes in the right middle lobe and lingula similar to that seen on prior study. No new focal infiltrate is seen. Hepatobiliary: No focal liver abnormality is seen. No gallstones, gallbladder wall thickening, or biliary dilatation. Pancreas: Unremarkable. No pancreatic ductal dilatation or surrounding inflammatory changes. Spleen: Normal in size without focal abnormality. Adrenals/Urinary Tract: Adrenal glands are within normal limits. Kidneys are well visualized bilaterally. No renal calculi or obstructive changes are seen. Small cyst is noted in the upper pole of the right kidney.  The bladder is well distended. Stomach/Bowel: The appendix is not well visualized and may have been surgically removed. No obstructive or inflammatory changes of the large or small bowel are seen. Stomach is decompressed. Vascular/Lymphatic: Aortic atherosclerosis. No enlarged abdominal or pelvic lymph nodes. Reproductive: Status post hysterectomy. No adnexal masses. Other: No abdominal wall hernia or abnormality. No abdominopelvic ascites. Musculoskeletal: No acute or significant osseous findings. IMPRESSION: Changes similar to that seen on prior exam. No acute abnormality is noted. Electronically Signed   By: Inez Catalina M.D.   On: 08/28/2020 09:15   VAS US CAROTID (at [facility] and [facility] only)  Result Date: 08/29/2020 Carotid Arterial Duplex Study Indications:  Recent fall with progressive decline of mental status. Risk Factors: Hypertension, hyperlipidemia, Diabetes. Performing Technologist: Rogelia Rohrer  Examination Guidelines: A complete evaluation includes B-mode imaging, spectral Doppler, color Doppler, and power Doppler as needed of all accessible portions of each vessel. Bilateral testing is considered an integral part of a complete examination. Limited examinations for reoccurring indications may be performed as noted.  Right Carotid Findings:  +----------+--------+--------+--------+------------------+--------+           PSV cm/sEDV cm/sStenosisPlaque DescriptionComments +----------+--------+--------+--------+------------------+--------+ CCA Prox  97      5                                          +----------+--------+--------+--------+------------------+--------+ CCA Distal122     28                                         +----------+--------+--------+--------+------------------+--------+ ICA Prox  87      23      1-39%                              +----------+--------+--------+--------+------------------+--------+ ECA       112     0                                          +----------+--------+--------+--------+------------------+--------+ +----------+--------+-------+----------------+-------------------+           PSV cm/sEDV cmsDescribe        Arm Pressure (mmHG) +----------+--------+-------+----------------+-------------------+ [id number]     0      Multiphasic, WNL                    +----------+--------+-------+----------------+-------------------+ +---------+--------+--+--------+--+---------+ VertebralPSV cm/s58EDV cm/s10Antegrade +---------+--------+--+--------+--+---------+  Left Carotid Findings: +----------+--------+--------+--------+------------------+--------+           PSV cm/sEDV cm/sStenosisPlaque DescriptionComments +----------+--------+--------+--------+------------------+--------+ CCA Prox  104     0                                          +----------+--------+--------+--------+------------------+--------+ CCA Distal100     12                                         +----------+--------+--------+--------+------------------+--------+ ICA Prox  53      12      1-39%                              +----------+--------+--------+--------+------------------+--------+  ICA Distal77      17                                          +----------+--------+--------+--------+------------------+--------+ ECA       63      0                                          +----------+--------+--------+--------+------------------+--------+ +----------+--------+--------+----------------+-------------------+           PSV cm/sEDV cm/sDescribe        Arm Pressure (mmHG) +----------+--------+--------+----------------+-------------------+ [id number]      0       Multiphasic, WNL                    +----------+--------+--------+----------------+-------------------+ +---------+--------+--+--------+--+---------+ VertebralPSV cm/s65EDV cm/s17Antegrade +---------+--------+--+--------+--+---------+   Summary: Right Carotid: Velocities in the right ICA are consistent with a 1-39% stenosis. Left Carotid: Velocities in the left ICA are consistent with a 1-39% stenosis. Vertebrals:  Bilateral vertebral arteries demonstrate antegrade flow. Subclavians: Normal flow hemodynamics were seen in bilateral subclavian              arteries. *See table(s) above for measurements and observations.  Electronically signed by Servando Snare MD on 08/29/2020 at 7:59:00 AM.    Final    VAS [place] LOWER EXTREMITY VENOUS (DVT)  Result Date: 09/03/2020  Lower Venous DVT Study Other Indications: CVA (cryptogenic). Performing Technologist: Rogelia Rohrer  Examination Guidelines: A complete evaluation includes B-mode imaging, spectral Doppler, color Doppler, and power Doppler as needed of all accessible portions of each vessel. Bilateral testing is considered an integral part of a complete examination. Limited examinations for reoccurring indications may be performed as noted. The reflux portion of the exam is performed with the patient in reverse Trendelenburg.  +---------+---------------+---------+-----------+----------+--------------+ RIGHT    CompressibilityPhasicitySpontaneityPropertiesThrombus Aging  +---------+---------------+---------+-----------+----------+--------------+ CFV      Full           Yes      Yes                                 +---------+---------------+---------+-----------+----------+--------------+ SFJ      Full                                                        +---------+---------------+---------+-----------+----------+--------------+ FV Prox  Full           Yes      Yes                                 +---------+---------------+---------+-----------+----------+--------------+ FV Mid   Full           Yes      Yes                                 +---------+---------------+---------+-----------+----------+--------------+ FV DistalFull           Yes      Yes                                 +---------+---------------+---------+-----------+----------+--------------+  PFV      Full                                                        +---------+---------------+---------+-----------+----------+--------------+ POP      Full           Yes      Yes                                 +---------+---------------+---------+-----------+----------+--------------+ PTV      Full                                                        +---------+---------------+---------+-----------+----------+--------------+ PERO     Full                                                        +---------+---------------+---------+-----------+----------+--------------+   +---------+---------------+---------+-----------+----------+--------------+ LEFT     CompressibilityPhasicitySpontaneityPropertiesThrombus Aging +---------+---------------+---------+-----------+----------+--------------+ CFV      Full           Yes      Yes                                 +---------+---------------+---------+-----------+----------+--------------+ SFJ      Full                                                         +---------+---------------+---------+-----------+----------+--------------+ FV Prox  Full           Yes      Yes                                 +---------+---------------+---------+-----------+----------+--------------+ FV Mid   Full           Yes      Yes                                 +---------+---------------+---------+-----------+----------+--------------+ FV DistalFull           Yes      Yes                                 +---------+---------------+---------+-----------+----------+--------------+ PFV      Full                                                        +---------+---------------+---------+-----------+----------+--------------+  POP      Full           Yes      Yes                                 +---------+---------------+---------+-----------+----------+--------------+ PTV      Full                                                        +---------+---------------+---------+-----------+----------+--------------+ PERO     Full                                                        +---------+---------------+---------+-----------+----------+--------------+     Summary: RIGHT: - There is no evidence of deep vein thrombosis in the lower extremity.  - No cystic structure found in the popliteal fossa.  LEFT: - There is no evidence of deep vein thrombosis in the lower extremity.  - No cystic structure found in the popliteal fossa.  *See table(s) above for measurements and observations. Electronically signed by Servando Snare MD on 09/03/2020 at 8:20:27 PM.    Final    US Abdomen Limited RUQ (LIVER/GB)  Result Date: 08/28/2020 CLINICAL DATA:  Nausea and vomiting. EXAM: ULTRASOUND ABDOMEN LIMITED RIGHT UPPER QUADRANT COMPARISON:  CT scan 08/19/2020 FINDINGS: Gallbladder: No gallstones or gallbladder wall thickening. No pericholecystic fluid. The sonographer reports no sonographic Murphy's sign. Common bile duct: Diameter: 1-2 mm Liver: No focal lesion  identified. Within normal limits in parenchymal echogenicity. Portal vein is patent on color Doppler imaging with normal direction of blood flow towards the liver. Other: None. IMPRESSION: No acute findings. No evidence to explain the patient's history of nausea and vomiting. Electronically Signed   By: Misty Stanley M.D.   On: 08/28/2020 08:04    Microbiology Recent Results (from the past 240 hour(s))  Resp Panel by RT-PCR (Flu A&B, Covid) Nasopharyngeal Swab     Status: None   Collection Time: 09/04/20 10:25 AM   Specimen: Nasopharyngeal Swab; Nasopharyngeal(NP) swabs in vial transport medium  Result Value Ref Range Status   SARS Coronavirus 2 by RT PCR NEGATIVE NEGATIVE Final    Comment: (NOTE) SARS-CoV-2 target nucleic acids are NOT DETECTED.  The SARS-CoV-2 RNA is generally detectable in upper respiratory specimens during the acute phase of infection. The lowest concentration of SARS-CoV-2 viral copies this assay can detect is 138 copies/mL. A negative result does not preclude SARS-Cov-2 infection and should not be used as the sole basis for treatment or other patient management decisions. A negative result may occur with  improper specimen collection/handling, submission of specimen other than nasopharyngeal swab, presence of viral mutation(s) within the areas targeted by this assay, and inadequate number of viral copies(<138 copies/mL). A negative result must be combined with clinical observations, patient history, and epidemiological information. The expected result is Negative.  Fact Sheet for Patients:  EntrepreneurPulse.com.au  Fact Sheet for Healthcare Providers:  IncredibleEmployment.be  This test is no t yet approved or cleared by the [place] FDA and  has been authorized for detection and/or diagnosis of SARS-CoV-2 by FDA under  an Emergency Use Authorization (EUA). This EUA will remain  in effect (meaning this test can be used)  for the duration of the COVID-19 declaration under Section 564(b)(1) of the Act, 21 U.S.C.section 360bbb-3(b)(1), unless the authorization is terminated  or revoked sooner.       Influenza A by PCR NEGATIVE NEGATIVE Final   Influenza B by PCR NEGATIVE NEGATIVE Final    Comment: (NOTE) The Xpert Xpress SARS-CoV-2/FLU/RSV plus assay is intended as an aid in the diagnosis of influenza from Nasopharyngeal swab specimens and should not be used as a sole basis for treatment. Nasal washings and aspirates are unacceptable for Xpert Xpress SARS-CoV-2/FLU/RSV testing.  Fact Sheet for Patients: BloggerCourse.com  Fact Sheet for Healthcare Providers: SeriousBroker.it  This test is not yet approved or cleared by the [place] FDA and has been authorized for detection and/or diagnosis of SARS-CoV-2 by FDA under an Emergency Use Authorization (EUA). This EUA will remain in effect (meaning this test can be used) for the duration of the COVID-19 declaration under Section 564(b)(1) of the Act, 21 U.S.C. section 360bbb-3(b)(1), unless the authorization is terminated or revoked.  Performed at [facility], [address], [place], [place] [address]   Culture, Urine     Status: Abnormal   Collection Time: 09/04/20  8:20 PM   Specimen: Urine, Random  Result Value Ref Range Status   Specimen Description URINE, RANDOM  Final   Special Requests   Final    NONE Performed at [facility], [address] [place], [place] [address]    Culture 40,000 COLONIES/mL YEAST (A)  Final   Report Status 09/06/2020 FINAL  Final  SARS CORONAVIRUS 2 (TAT 6-24 HRS) Nasopharyngeal Nasopharyngeal Swab     Status: None   Collection Time: 09/08/20  2:11 PM   Specimen: Nasopharyngeal Swab  Result Value Ref Range Status   SARS Coronavirus 2 NEGATIVE NEGATIVE Final    Comment: (NOTE) SARS-CoV-2 target nucleic acids are NOT DETECTED.  The SARS-CoV-2  RNA is generally detectable in upper and lower respiratory specimens during the acute phase of infection. Negative results do not preclude SARS-CoV-2 infection, do not rule out co-infections with other pathogens, and should not be used as the sole basis for treatment or other patient management decisions. Negative results must be combined with clinical observations, patient history, and epidemiological information. The expected result is Negative.  Fact Sheet for Patients: HairSlick.no  Fact Sheet for Healthcare Providers: quierodirigir.com  This test is not yet approved or cleared by the [place] FDA and  has been authorized for detection and/or diagnosis of SARS-CoV-2 by FDA under an Emergency Use Authorization (EUA). This EUA will remain  in effect (meaning this test can be used) for the duration of the COVID-19 declaration under Se ction 564(b)(1) of the Act, 21 U.S.C. section 360bbb-3(b)(1), unless the authorization is terminated or revoked sooner.  Performed at [facility], [address], [place], [place] [address]     Lab Basic Metabolic Panel: Recent Labs  Lab 09/08/20 0557 09/09/20 0316 09/10/20 0249 09/11/20 0213 09/11/20 1636 September 26, 2020 0443  NA 133* 136 138 140  --  148*  K 3.1* 3.3* 3.5 3.0*  --  3.6  CL 97* 97* 102 105  --  115*  CO2 22 26 22 22   --  17*  GLUCOSE 155* 175* 234* 192*  --  298*  BUN 19 24* 27* 33*  --  38*  CREATININE 0.67 0.71 0.77 0.76  --  0.97  CALCIUM 9.1 9.4 9.7 9.5  --  9.7  MG  --  1.8 1.8  --  1.7 1.6*  PHOS  --   --   --   --  3.6 2.6   Liver Function Tests: Recent Labs  Lab 09/06/20 1008 10-04-2020 0443  AST 12* 18  ALT 14 16  ALKPHOS 62 84  BILITOT 0.6 1.0  PROT 5.6* 5.7*  ALBUMIN 3.0* 3.0*   CBC: Recent Labs  Lab 09/06/20 1008 09/08/20 0557 09/11/20 0213 Oct 04, 2020 0443  WBC 1.4* 1.6* 1.4* 1.7*  NEUTROABS 0.9* 1.0*  --   --   HGB 10.1* 10.0* 10.1*  10.4*  HCT 29.2* 29.7* 29.9* 31.3*  MCV 86.6 86.6 86.4 89.4  PLT 93* 82* 71* 63*   Total time critical care: 1 hour.  Jacquelynn Cree, MD [facility] October 04, 2020, 2:21 PM

## 2020-10-06 NOTE — TOC Progression Note (Signed)
Transition of Care [facility]) - Progression Note    Patient Details  Name: Kara Griffin MRN: [id number] Date of Birth: May 26, 1936  Transition of Care [facility] CM/SW [place], [place] Phone Number: 10/05/2020, 1:51 PM  Clinical Narrative:    Kara Griffin was not available to be with pt's son during code. CSW provided support and prescence for pt's son during code. CSW stayed with pt to help him process grief and loss. CSW helped to walk him through next steps and offer resources. He asked that an autopsy not be done.    Expected Discharge Plan: [place] Barriers to Discharge: SNF Pending bed offer,Insurance Authorization,Continued Medical Work up  Expected Discharge Plan and Services Expected Discharge Plan: [place] Choice: [place] arrangements for the past 2 months: Single Family Home Expected Discharge Date: 09/05/20                                     Social Determinants of Health (SDOH) Interventions    Readmission Risk Interventions No flowsheet data found.

## 2020-10-06 DEATH — deceased

## 2020-10-19 ENCOUNTER — Ambulatory Visit: Payer: Medicare PPO | Admitting: Internal Medicine

## 2020-11-17 ENCOUNTER — Other Ambulatory Visit: Payer: Self-pay | Admitting: Internal Medicine

## 2020-12-03 ENCOUNTER — Ambulatory Visit: Payer: Medicare PPO | Admitting: Cardiovascular Disease

## 2021-03-27 IMAGING — CT CT HEAD W/O CM
4 series · 16 of 47 positions shown, 18 images · non-contrast
Comparison: MRI 09/02/2020

CLINICAL DATA: Cerebral infarction, follow-up examination

EXAM:
CT HEAD WITHOUT CONTRAST
TECHNIQUE: Contiguous axial images were obtained from the base of the skull
through the vertex without intravenous contrast.

[Series 3: head without · axial · non-contrast · 0.43mm/px · z∈[-91,+29]mm · 7 of 32 slices shown, 9 images]
[im 4/32  brain]
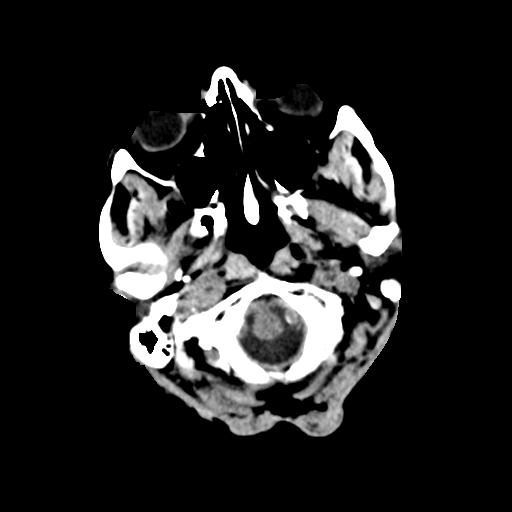
[im 4/32  bone]
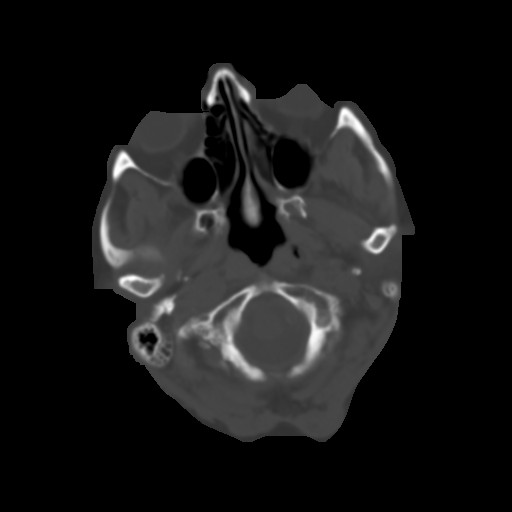
[im 8/32  brain]
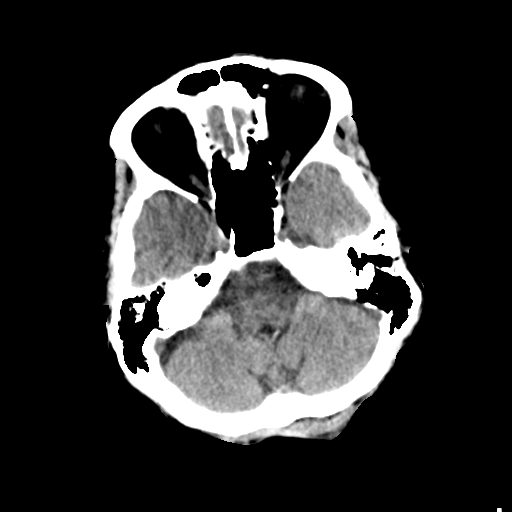
[im 12/32  brain]
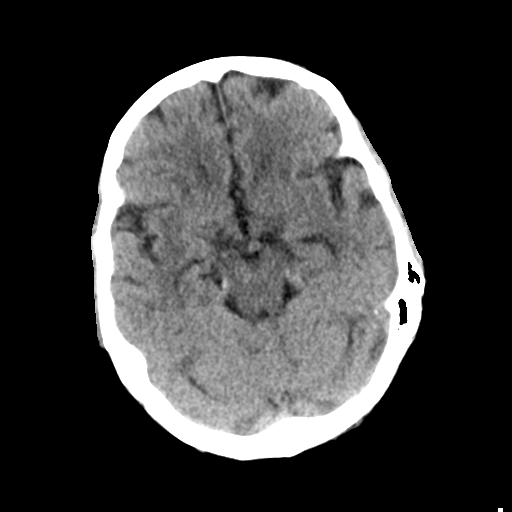
[im 16/32  brain]
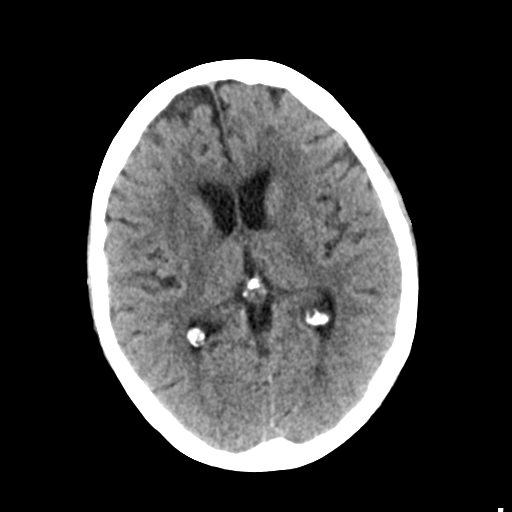
[im 20/32  brain]
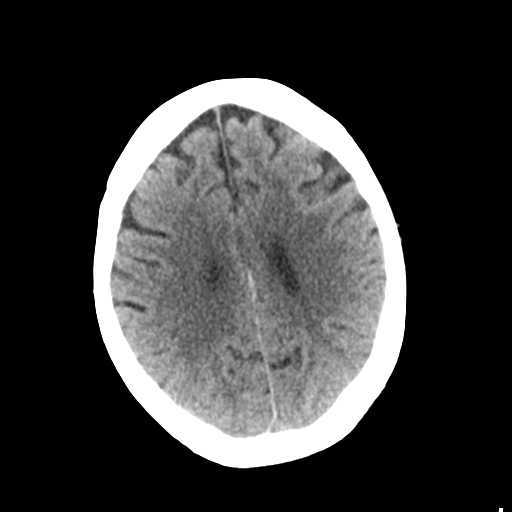
[im 20/32  bone]
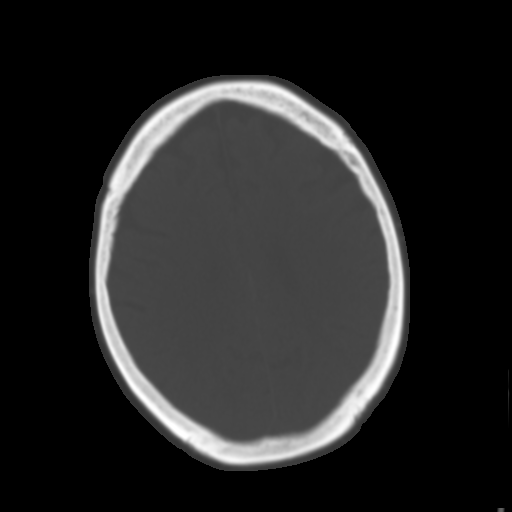
[im 24/32  brain]
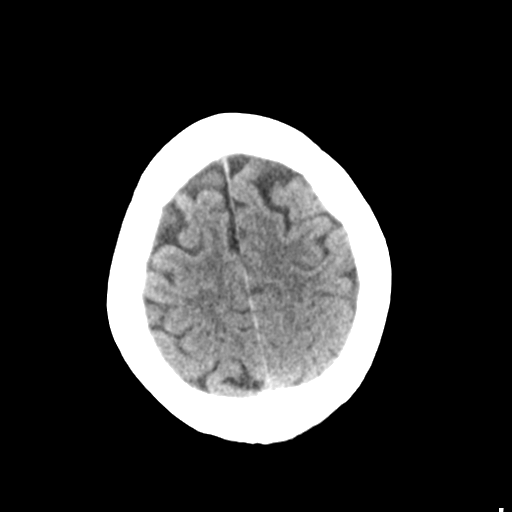
[im 28/32  brain]
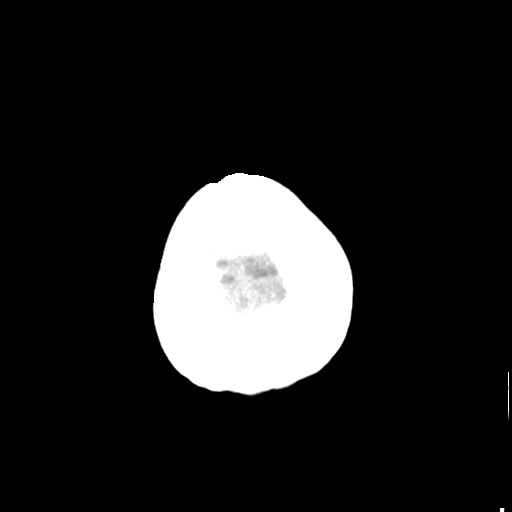

[Series 4: head bone · axial · 0.43mm/px · z∈[-92,-60]mm · 3 of 80 slices shown]
[im 8/80  bone]
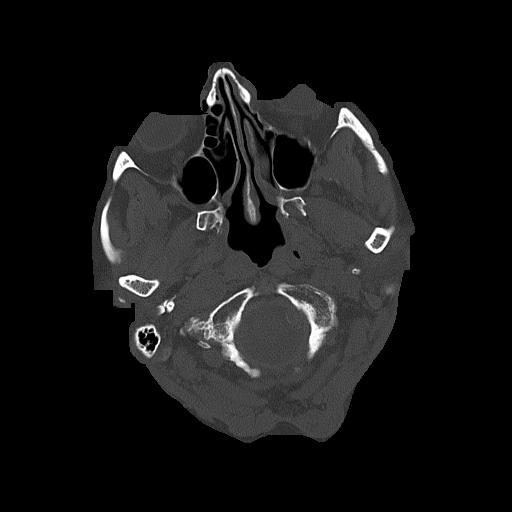
[im 16/80  bone]
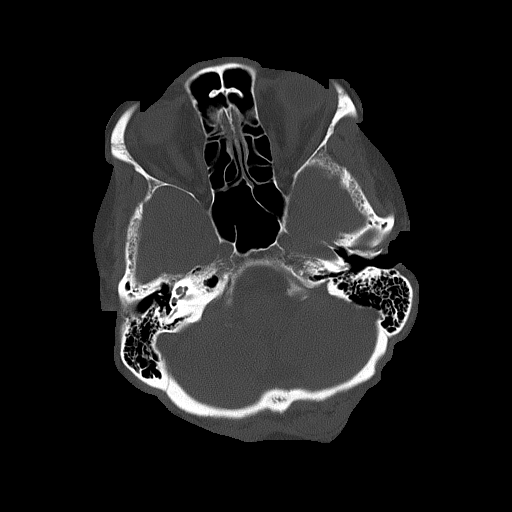
[im 24/80  bone]
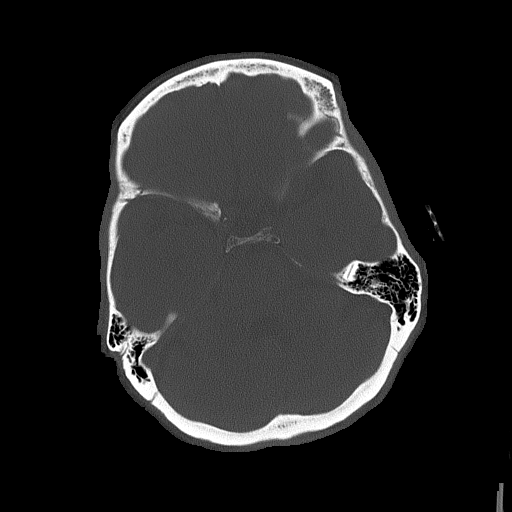

[Series 5: head without cor · coronal · non-contrast · 0.31mm/px · 3 of 67 slices shown]
[im 23/67  brain]
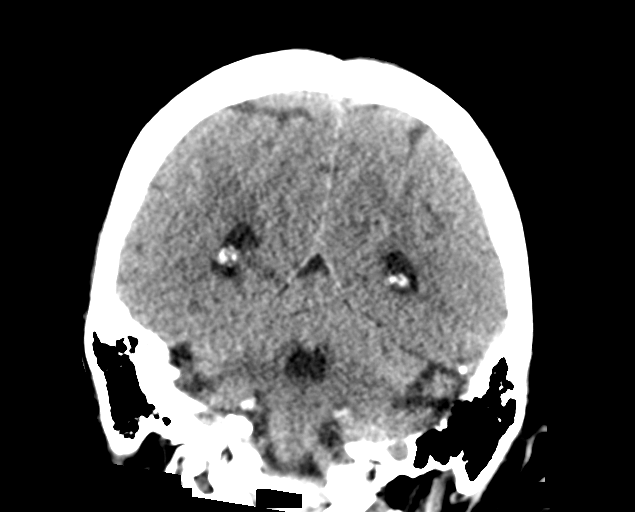
[im 30/67  brain]
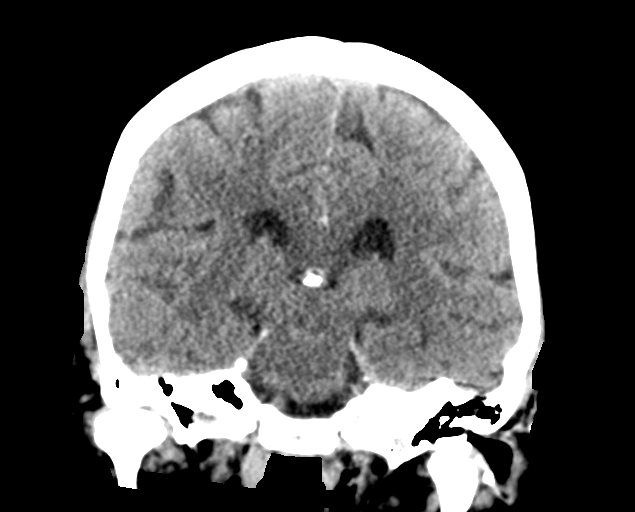
[im 37/67  brain]
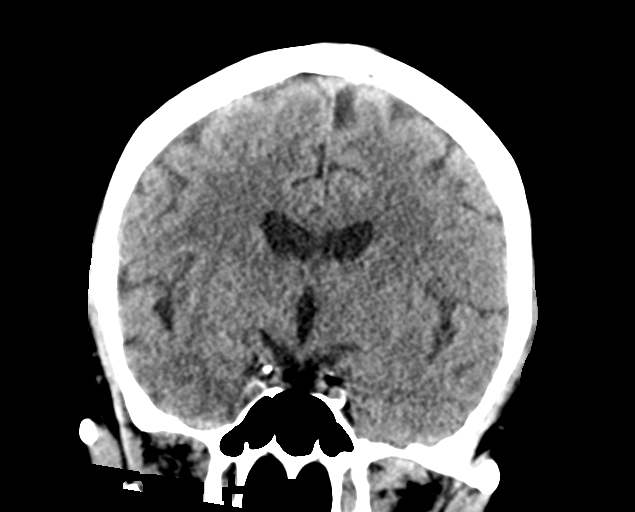

[Series 6: head without sag · sagittal · non-contrast · 0.31mm/px · 3 of 67 slices shown]
[im 23/67  brain]
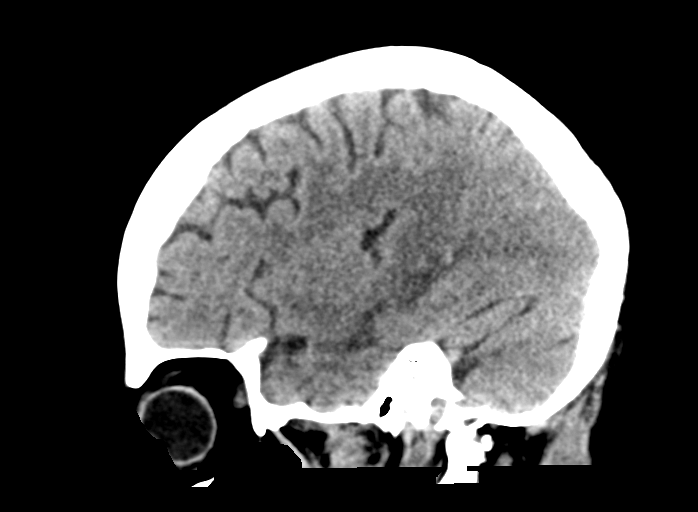
[im 34/67  brain]
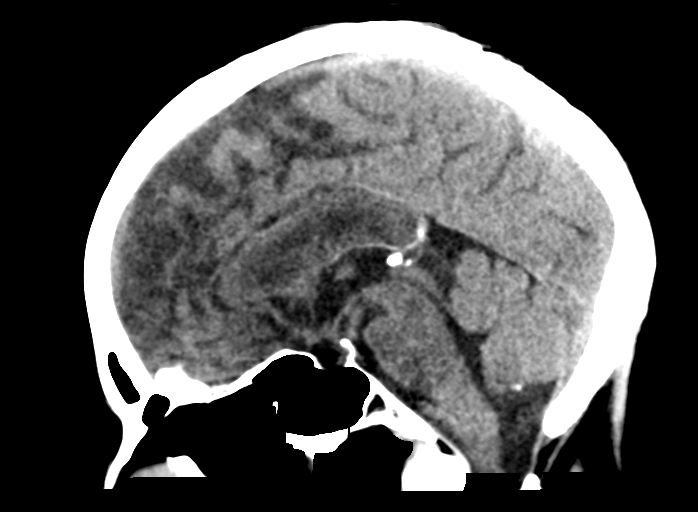
[im 45/67  brain]
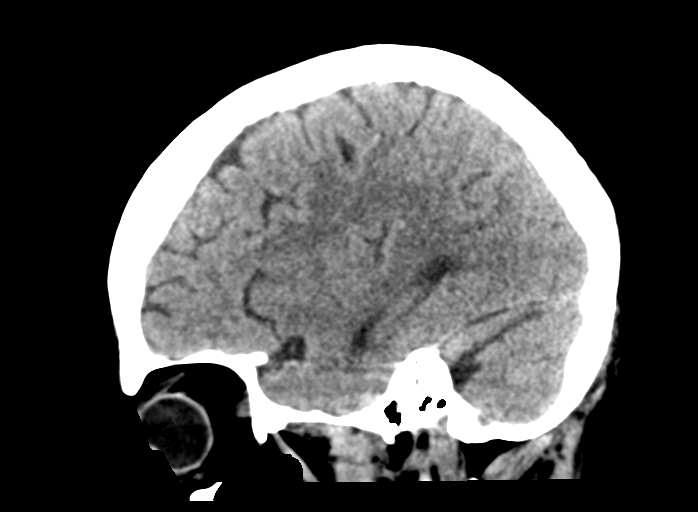

[16 of 47 positions shown; findings below may reference images not displayed]

FINDINGS: Brain: Normal anatomic configuration. Parenchymal volume loss is
commensurate with the patient's age. Mild periventricular white
matter changes are present likely reflecting the sequela of small
vessel ischemia. The known punctate infarcts within the right
putamen and cerebellum are not well appreciated on this examination.
No abnormal intra or extra-axial mass lesion or fluid collection. No
abnormal mass effect or midline shift. No evidence of acute
intracranial hemorrhage or infarct. Ventricular size is normal.
Cerebellum unremarkable.

Vascular: No asymmetric hyperdense vasculature at the skull base.

Skull: Intact

Sinuses/Orbits: Paranasal sinuses are clear. Orbits are
unremarkable.

Other: Mastoid air cells and middle ear cavities are clear.
IMPRESSION: Known punctate infarcts within the right putamen and cerebellum
noted on prior MRI examination are not well appreciated on this
examination. No definite evidence of acute intracranial hemorrhage
or infarct.
